# Patient Record
Sex: Female | Born: 1985 | Race: White | Hispanic: No | Smoking: Never smoker
Health system: Southern US, Community
[De-identification: ages and names within clinical notes are randomized; demographics above are authoritative.]

## PROBLEM LIST (undated history)

## (undated) DIAGNOSIS — R519 Headache, unspecified: Secondary | ICD-10-CM

## (undated) DIAGNOSIS — K859 Acute pancreatitis without necrosis or infection, unspecified: Secondary | ICD-10-CM

## (undated) DIAGNOSIS — F329 Major depressive disorder, single episode, unspecified: Secondary | ICD-10-CM

## (undated) DIAGNOSIS — R51 Headache: Secondary | ICD-10-CM

## (undated) DIAGNOSIS — G43909 Migraine, unspecified, not intractable, without status migrainosus: Secondary | ICD-10-CM

## (undated) DIAGNOSIS — F32A Depression, unspecified: Secondary | ICD-10-CM

## (undated) DIAGNOSIS — J45909 Unspecified asthma, uncomplicated: Secondary | ICD-10-CM

## (undated) DIAGNOSIS — F319 Bipolar disorder, unspecified: Secondary | ICD-10-CM

## (undated) DIAGNOSIS — I10 Essential (primary) hypertension: Secondary | ICD-10-CM

## (undated) DIAGNOSIS — F419 Anxiety disorder, unspecified: Secondary | ICD-10-CM

## (undated) DIAGNOSIS — B019 Varicella without complication: Secondary | ICD-10-CM

## (undated) DIAGNOSIS — B2799 Infectious mononucleosis, unspecified with other complication: Secondary | ICD-10-CM

## (undated) DIAGNOSIS — M549 Dorsalgia, unspecified: Secondary | ICD-10-CM

## (undated) DIAGNOSIS — R161 Splenomegaly, not elsewhere classified: Secondary | ICD-10-CM

## (undated) DIAGNOSIS — G8929 Other chronic pain: Secondary | ICD-10-CM

## (undated) DIAGNOSIS — B178 Other specified acute viral hepatitis: Secondary | ICD-10-CM

## (undated) DIAGNOSIS — I890 Lymphedema, not elsewhere classified: Secondary | ICD-10-CM

## (undated) DIAGNOSIS — I8393 Asymptomatic varicose veins of bilateral lower extremities: Secondary | ICD-10-CM

## (undated) DIAGNOSIS — F418 Other specified anxiety disorders: Secondary | ICD-10-CM

## (undated) HISTORY — DX: Headache, unspecified: R51.9

## (undated) HISTORY — DX: Varicella without complication: B01.9

## (undated) HISTORY — DX: Depression, unspecified: F32.A

## (undated) HISTORY — DX: Essential (primary) hypertension: I10

## (undated) HISTORY — DX: Major depressive disorder, single episode, unspecified: F32.9

## (undated) HISTORY — DX: Headache: R51

## (undated) HISTORY — DX: Bipolar disorder, unspecified: F31.9

## (undated) HISTORY — DX: Anxiety disorder, unspecified: F41.9

## (undated) HISTORY — DX: Migraine, unspecified, not intractable, without status migrainosus: G43.909

## (undated) HISTORY — DX: Unspecified asthma, uncomplicated: J45.909

## (undated) LAB — HM PAP SMEAR: HM Pap smear: NORMAL

---
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
## 2011-07-15 ENCOUNTER — Ambulatory Visit: Admit: 2011-07-15 | Payer: PRIVATE HEALTH INSURANCE

## 2011-07-15 DIAGNOSIS — R5383 Other fatigue: Secondary | ICD-10-CM | POA: Insufficient documentation

## 2011-07-15 NOTE — Unmapped (Signed)
Pt reporting recurring pattern - ?related to extensive tinea pedis as entry site.  Disc with pt - no current cellulitis, but needs aggressive foot care to prevent.  She is seeing Dermatology and has f/u in <=2 weeks.  Call with problems.  Verify CBC.

## 2011-07-15 NOTE — Unmapped (Signed)
Continue OCP and f/u with GYN

## 2011-07-15 NOTE — Unmapped (Signed)
Subjective  HPI:   Patient ID: Sarah [name] is a 26 y.o. female.    Chief Complaint:  HPI         26 yr old female - new patient to establish.    1)Generalized fatigue/slight intermittent dizziness/weight gain.  Ongoing issue for some time.  She denies med changes.  She has no F/C/HA/CP/edema/URI symptoms.  She does have a h/o bipolar with planned start of Lithium for better control of symptoms - med start pending check of renal function.    2)Recurrent lower leg cellulitis - she reports many episodes of cellulitis over the last several years - most recently in 12/12 when she was admitted.  She does have tinea pedis - currently on a topical anti-fungal per Dermatology.  No current redness/warmth/pain/rash.    3)PCOS - on OCP per GYN.  No recent labs results known to pt.    4)HTN - she relates this to the use of OCP's.  She is on lisinopril and reports good control.  She is compliant - doses in PM.  No HA/CP/edema.       ROS:   Review of Systems    As per HPI       Objective:   Physical Exam   Constitutional: No distress.   HENT:   Mouth/Throat: Oropharynx is clear and moist.   Eyes: Conjunctivae and EOM are normal. Pupils are equal, round, and reactive to light. No scleral icterus.   Neck: Carotid bruit is not present. No thyromegaly present.   Cardiovascular: Normal rate, regular rhythm, normal heart sounds and intact distal pulses.    No murmur heard.  Pulmonary/Chest: Effort normal and breath sounds normal. No respiratory distress. She has no wheezes. She has no rales.   Abdominal: Soft. Bowel sounds are normal. There is no tenderness.   Musculoskeletal: She exhibits no edema.   Lymphadenopathy:     She has no cervical adenopathy.   Neurological: She is alert. No cranial nerve deficit.   Skin: Rash noted.        Significant tineal changes - B feet - including inter-digital maceration and patches of dry split skin.   Psychiatric: She has a normal mood and affect. Her behavior is normal.             Filed Vitals:    07/15/11 1524   BP: 158/86   Temp: 98.6 ??F (37 ??C)   TempSrc: Oral   Height: 5' 6.5 (1.689 m)   Weight: 327 lb (148.326 kg)     Body mass index is 51.99 kg/(m^2).  Body surface area is 2.64 meters squared.                Assessment/Plan:     Patient Active Problem List   Diagnosis   ??? HTN, goal below 140/90   ??? Recurrent cellulitis of lower leg   ??? PCOS (polycystic ovarian syndrome)   ??? Fatigue

## 2011-07-15 NOTE — Unmapped (Signed)
Fatigue/etc - no clear etiology - ?metabolic (DM/thyroid) vs weight vs Bipolar.  Disc with pt - plan is to check labs - incl renal panel so pt can proceed with additional Bipolar medications.  Additional pending results.

## 2011-07-15 NOTE — Unmapped (Addendum)
Borderline today - initial visit - pt reports better control overall.  Disc salt restriction and weight changes.  Pt advised to monitor BP at home and call with updates.  Check renal.

## 2011-07-22 LAB — CBC/DIFF AMBIGUOUS DEFAULT
Basophils Absolute: 0.1 10*3/uL (ref 0.0–0.2)
Basophils Relative: 1 % (ref 0–3)
Eosinophils Absolute: 0.1 10*3/uL (ref 0.0–0.4)
Eosinophils Relative: 2 % (ref 0–7)
Hematocrit: 37.1 % (ref 34.0–46.6)
Hemoglobin: 12.3 g/dL (ref 11.1–15.9)
Immature Granulocytes Absolute: 0 10*3/uL (ref 0.0–0.1)
Immature Granulocytes: 0 % (ref 0–2)
Lymphocytes Absolute: 2.2 10*3/uL (ref 0.7–4.5)
Lymphocytes Relative: 28 % (ref 14–46)
MCH: 27.6 pg (ref 26.6–33.0)
MCHC: 33.2 g/dL (ref 31.5–35.7)
MCV: 83 fL (ref 79–97)
Monocytes Absolute: 0.6 10*3/uL (ref 0.1–1.0)
Monocytes Relative: 7 % (ref 4–13)
Neutrophils Absolute: 4.8 10*3/uL (ref 1.8–7.8)
Neutrophils Relative: 62 % (ref 40–74)
Platelets: 294 10*3/uL (ref 140–415)
RBC: 4.45 x10E6/uL (ref 3.77–5.28)
RDW: 15.7 % (ref 12.3–15.4)
WBC: 7.8 10*3/uL (ref 4.0–10.5)

## 2011-07-22 LAB — BASIC METABOLIC PANEL
BUN/Creatinine Ratio: 17 (ref 8–20)
BUN: 10 mg/dL (ref 6–20)
CO2: 23 mmol/L (ref 20–32)
Calcium: 9.2 mg/dL (ref 8.7–10.2)
Chloride: 104 mmol/L (ref 97–108)
Creatinine: 0.59 mg/dL (ref 0.57–1.00)
GFR MDRD Af Amer: 148 mL/min/{1.73_m2} (ref >59)
Glucose: 98 mg/dL (ref 65–99)
Potassium: 4.1 mmol/L (ref 3.5–5.2)
Sodium: 141 mmol/L (ref 134–144)
eGFR Non-Afr. American: 129 mL/min/{1.73_m2} (ref >59)

## 2011-07-22 LAB — HEPATIC FUNCTION PANEL
ALT: 23 IU/L (ref 0–32)
AST: 20 IU/L (ref 0–40)
Albumin: 3.9 g/dL (ref 3.5–5.5)
Alkaline Phosphatase: 81 IU/L (ref 25–150)
Bilirubin, Direct: 0.1 mg/dL (ref 0.00–0.40)
Total Bilirubin: 0.3 mg/dL (ref 0.0–1.2)
Total Protein: 7 g/dL (ref 6.0–8.5)

## 2011-07-22 LAB — LIPID PANEL
Cholesterol, Total: 200 mg/dL — ABNORMAL HIGH (ref 100–189)
HDL: 37 mg/dL — ABNORMAL LOW (ref >39)
Triglycerides: 497 mg/dL — ABNORMAL HIGH (ref 0–114)

## 2011-07-22 LAB — THYROID FUNCTION CASCADE: TSH: 1.25 u[IU]/mL (ref 0.450–4.500)

## 2011-07-22 LAB — HEMOGLOBIN A1C: Hemoglobin A1C: 5.4 % (ref 4.8–5.6)

## 2011-07-22 LAB — AMBIG ABBREV LP DEFAULT

## 2011-07-23 NOTE — Unmapped (Signed)
Message copied by Rupert Stacks on Wed Jul 23, 2011  1:39 PM  ------       Message from: Myer Peer       Created: Wed Jul 23, 2011  9:48 AM         Her labs are all normal except for the cholesterol.              Rec -        1)Proceed with Dermatology evaluation to clear the fungal infection on feet to reduce the chance for recurrent cellulitis.       2)Kidney panel is normal - proceed with Psychiatrist recs for additional medication for Bipolar disorder.       3)For cholesterol -        Increase exercise        Decrease fat/chol/calories in diet        Decrease weight        Recheck in 4 months.

## 2011-09-11 ENCOUNTER — Ambulatory Visit: Admit: 2011-09-11 | Payer: PRIVATE HEALTH INSURANCE

## 2011-09-11 DIAGNOSIS — L03115 Cellulitis of right lower limb: Secondary | ICD-10-CM

## 2011-09-11 NOTE — Unmapped (Signed)
Subjective  HPI:   Patient ID: Sarah [name] is a 26 y.o. female.    Chief Complaint:  Right leg rash    HPI       Symptoms of rash on the right leg.  Started about 2 days ago near the ankle.  Painful and swollen.  Lower leg has become red and warm to the touch.  Feeling nauseated and some vomiting.  Headaches and feeling her whole body hurts.  Had fever Tuesday night, but that she has noticed since.      When the symptoms started about 2 days ago, she started taking clindamycin every 6 hours and Bactrim every 12 hours.  She had these antibiotics on hand from a previous infection in the recent past.  Has continued these antibiotics for the past two days.  Despite that, there redness on the leg is spreading and getting significantly worse.      Has had a history of cellulitis several times in the past several years.  Last episode was in 04/2011.  Typically the left leg is affected.  Otherwise her symptoms now are identical to her usual symptoms of cellulitis.           ROS:   Review of Systems   Constitutional: Positive for fever (two days ago). Negative for activity change.   HENT: Negative for congestion and rhinorrhea.    Respiratory: Negative for cough and shortness of breath.    Cardiovascular: Negative for chest pain.   Gastrointestinal: Positive for nausea and vomiting. Negative for abdominal pain.   Genitourinary: Negative for difficulty urinating.   Musculoskeletal:        Generalized body aches.     Skin: Positive for rash (see HPI).   Neurological: Positive for headaches.          Objective:   Physical Exam   Vitals reviewed.  Constitutional: She is oriented to person, place, and time. She appears well-developed and well-nourished. No distress.   HENT:        Moist mucous membranes.     Pulmonary/Chest: Effort normal. No respiratory distress.   Musculoskeletal: She exhibits edema (see skin exam).   Neurological: She is alert and oriented to person, place, and time.   Skin:                  Filed Vitals:    09/11/11 1426   BP: 108/64   Temp: 99 ??F (37.2 ??C)   TempSrc: Oral   Height: 5' 6.5 (1.689 m)   Weight: 338 lb (153.316 kg)     Body mass index is 53.74 kg/(m^2).  Body surface area is 2.68 meters squared.            Assessment/Plan:     Patient Active Problem List   Diagnosis   ??? HTN, goal below 140/90   ??? Recurrent cellulitis of lower leg   ??? PCOS (polycystic ovarian syndrome)   ??? Fatigue       - Cellulitis of right lower leg.  History of frequent, recurrent cellulitis.  Has failed treatment with oral antibiotics (clindamycin and Bactrim), with worsening of the cellulitis while taking the antibiotics for two days.  Also with nausea and vomiting and overall feeling poorly.  Discussed treatment options and recommended going to the ED.  She will likely need IV antibiotics to clear the infection.  She agreed that seeking ED treatment would be best.  Offered to call ahead to the ED, but she wanted to check with family  before making a decision where to go.  Advised to follow-up here after ED visit/potential hospitalization.

## 2011-09-11 NOTE — Unmapped (Signed)
I recommend going to the emergency room.

## 2012-01-20 ENCOUNTER — Ambulatory Visit: Admit: 2012-01-20 | Payer: PRIVATE HEALTH INSURANCE

## 2012-01-20 DIAGNOSIS — N390 Urinary tract infection, site not specified: Secondary | ICD-10-CM

## 2012-01-20 LAB — POCT URINALYSIS DIPSTICK, AUTOMATED W/O MICRO
POCT - ALT (SGPT): 1.025 (ref 0–55)
POCT - Bilirubin, UA: NEGATIVE
POCT - Blood, UA: POSITIVE
POCT - Glucose, UA: NEGATIVE
POCT - Ketones, UA: NEGATIVE
POCT - Leukocytes Esterase, UA: POSITIVE
POCT - Nitrite, UA: NEGATIVE
POCT - Other Cells, UA: NORMAL
POCT - Protein, UA: NEGATIVE
POCT - Urobilinogen, UA: NEGATIVE
POCT - pH, UA: 6

## 2012-01-20 LAB — URINE CULTURE
Culture Result: >100000
Organism 2: <10000

## 2012-01-20 MED ORDER — phenazopyridine (PYRIDIUM) 200 MG tablet
200 | ORAL_TABLET | Freq: Three times a day (TID) | ORAL | Status: AC
Start: 2012-01-20 — End: 2012-05-10

## 2012-01-20 MED ORDER — ciprofloxacin (CIPRO) 500 MG tablet
500 | ORAL_TABLET | Freq: Two times a day (BID) | ORAL | Status: AC
Start: 2012-01-20 — End: 2012-01-28

## 2012-01-20 NOTE — Unmapped (Signed)
Subjective  HPI:   Patient ID: Sarah [name] is a 26 y.o. female.    Chief Complaint:  HPI       UTI: started 5 days ago, usually takes bactrim for uti, but had bactrim in April.  Started bactrim 4 days ago,   - No hematuria, but dysuria and frequency  - now having right sided back pain, very intense  - + nausea, no vomiting  - No fevers, chills  - No change in BMs.          ROS:   Review of Systems       Objective:   Physical Exam   Constitutional: She is oriented to person, place, and time. She appears well-developed and well-nourished. No distress.   Cardiovascular: Normal rate, regular rhythm and normal heart sounds.  Exam reveals no gallop and no friction rub.    No murmur heard.  Pulmonary/Chest: Effort normal and breath sounds normal. No respiratory distress. She has no wheezes. She has no rales.   Abdominal: Soft. Bowel sounds are normal. She exhibits no distension. There is no tenderness. There is no rebound and no guarding.        Suprapubic pain on palpation. Right sided cva tenderness   Neurological: She is alert and oriented to person, place, and time.   Skin: Skin is warm and dry. She is not diaphoretic.   Psychiatric: She has a normal mood and affect. Her behavior is normal. Judgment and thought content normal.             Filed Vitals:    01/20/12 1121   BP: 146/86   Temp: 98.6 ??F (37 ??C)   TempSrc: Oral   Height: 5' 6 (1.676 m)   Weight: 354 lb (160.573 kg)     Body mass index is 57.14 kg/(m^2).  Body surface area is 2.73 meters squared.                Assessment/Plan:     Patient Active Problem List   Diagnosis   ??? HTN, goal below 140/90   ??? Recurrent cellulitis of lower leg   ??? PCOS (polycystic ovarian syndrome)   ??? Fatigue     UTI (lower urinary tract infection)  Concern for pyelo no improvement with bactrim  - change to cipro x 7 days  - Pyridium   - go to er if start having persistent fevers, vomiting, worsening pain.

## 2012-01-20 NOTE — Unmapped (Addendum)
Concern for pyelo no improvement with bactrim  - change to cipro x 7 days  - Pyridium   - go to er if start having persistent fevers, vomiting, worsening pain.

## 2012-01-20 NOTE — Unmapped (Signed)
Please change to cipro 500mg  by mouth x 7 days, and Phenazopyridine for pain with urination.  If you start having high fever, vomiting, or extremely pain not improving with antibiotics please go to ER

## 2012-01-22 NOTE — Unmapped (Signed)
Can you please let the patient know that her urine was growing e. Coli ( a type of bacteria ) and it is sensitive to cipro, so she continue to take the full week of cipro.

## 2012-01-22 NOTE — Unmapped (Signed)
lmtcb with pt mother re: pt results

## 2012-01-28 MED ORDER — ciprofloxacin (CIPRO) 500 MG tablet
500 | ORAL_TABLET | Freq: Two times a day (BID) | ORAL | Status: AC
Start: 2012-01-28 — End: 2012-01-28

## 2012-01-28 MED ORDER — ciprofloxacin (CIPRO) 500 MG tablet
500 | ORAL_TABLET | Freq: Two times a day (BID) | ORAL | Status: AC
Start: 2012-01-28 — End: 2013-03-17

## 2012-01-28 NOTE — Unmapped (Signed)
Pt is calling back stating that she still has the uti. She has already finished all of the antibiotic on Monday. What should she do now. She is still burning w/ kidney pain. It is not as bad as it was before but it is still there. pls advise pt.

## 2012-01-28 NOTE — Unmapped (Signed)
dONE.

## 2012-01-28 NOTE — Unmapped (Signed)
Left message for the patient to call back.

## 2012-01-28 NOTE — Unmapped (Signed)
Will extend the course of Cipro and she should increase fluids.  Clarify and enter pharmacy.

## 2012-03-02 ENCOUNTER — Ambulatory Visit: Admit: 2012-03-02 | Payer: PRIVATE HEALTH INSURANCE

## 2012-03-02 DIAGNOSIS — J209 Acute bronchitis, unspecified: Secondary | ICD-10-CM

## 2012-03-02 MED ORDER — promethazine-codeine (PHENERGAN WITH CODEINE) 6.25-10 mg/5 mL syrup
6.25-10 | ORAL | 0.00 refills | 8.00000 days | Status: AC | PRN
Start: 2012-03-02 — End: 2012-05-10

## 2012-03-02 MED ORDER — azithromycin (ZITHROMAX) 250 MG tablet
250 | ORAL_TABLET | ORAL | Status: AC
Start: 2012-03-02 — End: 2012-05-10

## 2012-03-02 NOTE — Unmapped (Signed)
Subjective  HPI:   Patient ID: Sarah [name] is a 26 y.o. female.    Chief Complaint:  HPI Comments: Here with 1-2 week illness  Headache tried 800 mg ibuprofen without relief  headache for 5 days  Chills  Hasn't checked temp  Blowing yellow  Coughing non productive             Medications:  Current Outpatient Prescriptions   Medication Sig Dispense Refill   ??? citalopram (CELEXA) 20 MG tablet Take 20 mg by mouth daily.       ??? diphenhydrAMINE (BENADRYL) 25 mg tablet Take 25 mg by mouth at bedtime as needed.       ??? lisinopril (PRINIVIL,ZESTRIL) 10 MG tablet Take 10 mg by mouth daily.       ??? lithium carbonate 300 MG capsule Take 300 mg by mouth 2 times a day with meals.       ??? norethindrone-ethinyl estradiol (OVCON) 0.4-35 mg-mcg per tablet Take 1 tablet by mouth daily.       ??? penicillin G benzathine (BICILLIN L-A) 2,400,000 unit/4 mL Syrg Inject 2.4 Million Units into the muscle once.       ??? phenazopyridine (PYRIDIUM) 200 MG tablet Take 1 tablet (200 mg total) by mouth 3 times a day with meals.  6 tablet  0        ROS:   Review of Systems   Constitutional: Positive for chills, activity change and fatigue. Negative for diaphoresis, appetite change and unexpected weight change.   HENT: Negative for rhinorrhea and postnasal drip.    Respiratory: Positive for wheezing. Negative for shortness of breath.    Cardiovascular: Negative for chest pain.   Musculoskeletal: Negative for back pain and arthralgias.   Neurological: Negative for numbness.   Psychiatric/Behavioral: Negative for dysphoric mood and decreased concentration.   All other systems reviewed and are negative.           Objective:   Physical Exam   Nursing note and vitals reviewed.  Constitutional: She is oriented to person, place, and time. She appears well-developed and well-nourished. No distress.   HENT:   Head: Normocephalic and atraumatic.   Nose: Nose normal.   Mouth/Throat: No oropharyngeal exudate.   Eyes: EOM are normal. Pupils are equal, round, and  reactive to light. No scleral icterus.   Neck: Normal range of motion. Neck supple.   Cardiovascular: Normal rate and regular rhythm.    No murmur heard.  Pulmonary/Chest: Effort normal and breath sounds normal. No respiratory distress. She exhibits no tenderness.   Abdominal: Soft. Bowel sounds are normal. She exhibits no distension.   Musculoskeletal: Normal range of motion. She exhibits no edema.   Neurological: She is alert and oriented to person, place, and time.   Skin: Skin is warm and dry. No erythema.   Psychiatric: She has a normal mood and affect. Her behavior is normal. Thought content normal.             Filed Vitals:    03/02/12 1624   BP: 134/86   Temp: 98.4 ??F (36.9 ??C)   TempSrc: Oral   Height: 5' 7 (1.702 m)   Weight: 355 lb (161.027 kg)     Body mass index is 55.60 kg/(m^2).  Body surface area is 2.76 meters squared.                Assessment/Plan:     Patient Active Problem List   Diagnosis   ??? HTN, goal below 140/90   ???  Recurrent cellulitis of lower leg   ??? PCOS (polycystic ovarian syndrome)   ??? Fatigue   ??? UTI (lower urinary tract infection)

## 2012-03-02 NOTE — Unmapped (Signed)
Fluids rest zpak  Codeine cough

## 2012-03-26 NOTE — Unmapped (Signed)
ED PATIENT ENCOUNTER ARRIVAL: 03/26/12 0136             EMERGENCY DEPARTMENT -  Extremity NOTE     CHIEF COMPLAINT Chief Complaint Patient presents with   Leg Swelling   Leg Pain     HPI Sarah [name] is a 26 year old female who presents complaining of erythema, tenderness, warmth in the right lower leg and foot. Patient has a long-standing history of recurrent cellulitis of the legs. She is currently followed by infectious disease.   She states her symptoms are consistent with cellulitis and that this is how they usually began. This episode began approximately 6 hours ago. Patient states she spoke with her infectious disease doctor who advised her to come to the emergency department.   Patient is on monthly penicillin shots. She is due to have one in the next week. She is taking no other medications for her symptoms. There are no aggravating or relieving factors. She denies shortness ofbreath, chest pain, headache, dizziness. She   denies any injury.     REVIEW OF SYSTEMS See HPI for further details. Review of systems otherwise negative.     PAST MEDICAL HISTORY Past Medical History Diagnosis Date   PCOS (polycystic ovarian syndrome)   Encarnacion Chu CNP   Hypertension   Bipolar affective disorder (HCC)   Dr. Marnette Burgess   Cellulitis and abscess of leg   recurrent cellulitis   Edema   Morbid obesity   (HCC)     SURGICAL HISTORY Past Surgical History Procedure Laterality Date   Lap,cholecystectomy  2003   [facility]     CURRENT MEDICATIONS Prior to Admission medications Medication Sig Start Date End Date Taking? Authorizing Provider Penicillin G Benzathine & Proc 1200000 UNIT/2ML SUSP by Intramuscular route. Yes Med, Historical furosemide (LASIX) 20 MG TABS Take 1   tablet by mouth every morning. 11/17/11 03/26/12  Stretcher, Angelique Blonder, MD spironolactone (ALDACTONE) 50 MG TABS Take 50 mg by mouth daily.  03/26/12  Med, Historical lithium 300 MG CAPS Take 300 mg by mouth 2 (two) times daily.  03/26/12  Med, Historical   citalopram  (CELEXA) 20 MG TABS Take 20 mg by mouth daily.    03/26/12  Med, Historical Norethindrone-Eth Estradiol (OVCON-35, 28,) 0.4-35 MG-MCG TABS Take 1 tablet by mouth daily. 01/08/11 03/26/12  Stretcher, Angelique Blonder, MD lisinopril (PRINIVIL,ZESTRIL) 10 MG   TABS Take 1 tablet by mouth daily. 01/08/11 03/26/12  Stretcher, Angelique Blonder, MD     ALLERGIES No Known Allergies     FAMILY HISTORY Family History Problem Relation Age of Onset   pulm embolus[other] [OTHER] Sister   PCOS[other] [OTHER] Sister   Hypertension Mother   Diabetes Father   Hypertension Father     SOCIAL HISTORY History     Social History   Marital Status: Married   Spouse Name: N/A   Number of Children: N/A   Years of Education: N/A     Social History Main Topics   Smoking status: Never Smoker   Smokeless tobacco: Never Used   Alcohol Use: Yes    socially   Drug Use: No   Sexually Active: Yes     Other Topics Concern   None     Social History Narrative   None     PHYSICAL EXAM VITAL SIGNS: BP 149/83   Pulse 86   Temp(Src) 98.3  F (36.8  C) (Oral)   LMP 12/02/2011 Constitutional:  Well developed, well nourished, no acute distress, non-toxic appearance HENT:  Atraumatic,  Neck- normal range of motion, no   tenderness, supple Cardiovascular:  Distal pulses are intact in the  Right lower extremity, there is brisk capillary refill Musculoskeletal:   There is erythema, warmth, tenderness of the right posterior calf and dorsal foot, there is slight edema, there   is full range of motion in the ankle and toes Integument:  Well hydrated, no rash,  Erythema as above Neurologic:   Sensation to light touch and pain are intact in the right lower extremity     LABORATORY Recent Results (from the past 24 hour(s)) CBC WITH DIFFERENTIAL  Collection Time  03/26/12  2:52 AM     Result Value Range  WBC 11.8 (*) 3.6 - 10.5 THOU/mcL  RBC 4.29  3.80 - 5.20 MIL/mcL  HEMOGLOBIN 12.3  12.0 - 15.2 g/dL  HEMATOCRIT 35 (*)   36 - 46 %  MCV 82  82 - 97 fL  MCH 29  27 - 33 pg  MCHC 35  32 - 36 g/dL   RDW 16.1 (*) <09.6 %  PLATELET 320  140 - 375 THOU/mcL  ABS. NEUTROPHIL 7.83 (*) 1.80 - 7.70 THOU/mcL  ABS LYMPHS 2.98  1.00 - 4.00 THOU/mcL  ABS MONOS 0.78  0.20 - 0.90 THOU/mcL    ABS EOS 0.16  0.03 - 0.45 THOU/mcL  ABS BASOS 0.07  0.01 - 0.07 THOU/mcL  SEGS 66  LYMPHOCYTES 25  MONOCYTES 7  EOSINOPHIL 1  BASOPHILS 1 D DIMER QUANTITATIVE  Collection Time  03/26/12  2:52 AM     Result Value Range  D DIMER QUANTITATIVE 0.39  <0.5 mg/L   FEU POCT METABOLIC PANEL - ISTAT  Collection Time  03/26/12  2:57 AM     Result Value Range  GLUCOSE 122 (*) 74 - 99 mg/dL  BLD UREA NITROGEN 10  8 - 26 mg/dL  CREATININE 0.6  0.5 - 1.2 mg/dL  SODIUM 045  409 - 811 mEq/L  POTASSIUM 3.7  3.6 - 5.1 mEq/L    CHLORIDE 105  101 - 111 mEq/L  CO2 TOTAL 23 (*) 24 - 36 mEq/L  IONIZED CALCIUM 4.9  4.6 - 5.4 mg/dL  ANION GAP 19 (*) 6 - 18  HEMOGLOBIN,CALC 11.9 (*) 12.0 - 15.0 g/dL  HEMATOCRIT 35 (*) 36 - 47 %PCV POCT URINALYSIS  Collection Time  03/26/12  3:14 AM       Result Value Range  COLOR,POC URINE YELLOW  YELLOW  CLARITY,POC URINE CLEAR  CLEAR  PH,POC URINE 6.0  5.0 - 8.0  SPEC GRAVITY POC UR 1.015  1.005 - 1.029  PROTEIN,POC URINE NEGATIVE  NEGATIVE  GLUCOSE,POC URINE NEGATIVE  NEGATIVE  KETONES,POC URINE   NEGATIVE  NEGATIVE  BILIRUBIN,POC URINE NEGATIVE  NEGATIVE  BLOOD,POC URINE TRACE (*) NEGATIVE  UROBILINOGEN,POC URINE 0.2  <2.0  NITRITE,POC URINE NEGATIVE  NEGATIVE  LEUK ESTERASE,POC URN NEGATIVE  NEGATIVE POCT GONADOT HCG QUALITATIVE  Collection Time    03/26/12  3:22 AM     Result Value Range  HCG,POC URINE NEGATIVE  NEGATIVE     ED COURSE & MEDICAL DECISION MAKING     Active Problems:   * No active hospital problems. *     Pertinent Labs & Imaging studies reviewed. (See chart for details) Patient presented to the emergency department complaining of erythema, tenderness, edema in the right lower leg. She has a long-standing history of recurrent cellulitis in the legs.   Examination is consistent with cellulitis. Laboratory evaluation  is reassuring.  White blood cell count is 11.8. There is no significant metabolic abnormalities, d-dimer is 0.39. Urinalysis is essentially negative. Review of the patient's chart does   reveal that most recently the most effective treatment regimen involved IV clindamycin and monthly shots of IM penicillin.  We have Elected to treat with IV Unasyn and IV clindamycin. Patient did complain of some nausea so I have also ordered IV Zofran.     Anticipated the patient will be discharged home provided her vitals remained stable.   She will be given prescriptions for oral clindamycin and Augmentin. She'll be advised to followup with infectious disease. She  Or return should she develop   increased erythema and edema of the extremities, fever not relieved by over-the-counter medications, vomiting with inability to tolerate her medications, or other concerning symptoms.     The patient was given the following medication in the Emergency department:     Medication Administration from 03/26/2012 0136 to 03/26/2012 0353    Date/Time Order Dose Route Action Action by Comments   03/26/2012 0347 ampicillin-sulbactam sodium (UNASYN) 3 g in sodium chloride 0.9 % 100 mL IVPB 3 g Intravenous New Bag Daune Perch   03/26/2012 0347 ondansetron (ZOFRAN) injection 4 mg 4 mg Intravenous Given Daune Perch         The patient is discharged home in stable condition with appropriate instructions.     FINAL DIAGNOSIS:     1. Cellulitis and abscess of leg         The patient was given the following medications to go home with.     New Prescriptions  AMOXICILLIN-CLAVULANATE (AUGMENTIN) 875-125 MG TABS    Take 1 tablet by mouth 2 (two) times daily for 14 days.  CLINDAMYCIN (CLEOCIN) 300 MG CAPS    Take 1 capsule by mouth 4 (four) times daily.     Arlyss Repress., DO provided face-to-face care for this patient and is in agreement with the above diagnosis and treatment plan.         Guadelupe Sabin., PA 03/26/12 0347         Guadelupe Sabin.,  PA 03/26/12 0354         _________________________________  Signed by:    Guadelupe Sabin    10    D: 03/26/2012 03:54 AM  T: 03/26/2012 03:54 AM    This document is confidential medical information.  Unauthorized disclosure or use of this information is prohibited by law.  If you are not the intended recipient of this document, please advise [place] by calling immediately [phone].

## 2012-03-26 NOTE — Unmapped (Signed)
CURRENT STATUS IS EMERGENCY PENDING CARE MANAGEMENT DESIGNATION.     Any questions regarding MEDICAL RECORDS should be directed to Medical Records: [facility] [phone] or [facility] [phone]. Any questions regarding BILLING should be directed to the Care Management Departments: [facility] [phone] or [facility] [phone].         _________________________________  Signed byJamison Neighbor  NOTIFY    ZZ    D: 03/26/2012 01:36 AM  T:    This document is confidential medical information.  Unauthorized disclosure or use of this information is prohibited by law.  If you are not the intended recipient of this document, please advise [place] by calling immediately [phone].

## 2012-03-26 NOTE — Unmapped (Signed)
I have personally seen and examined this patient, with the following particular findings: Moderate erythema of the right lower/medial calf. This extends from the level of the ankle almost to the level of the knee. No true lymphangitis noted. Homans   appears negative. No medial thigh tenderness.     I have fully participated in the care of this patient. I have reviewed and agree with all pertinent clinical information including history, physical exam, labs, radiographic studies and the plan. I have also reviewed and agree with the medications,   allergies and past medical history sections for this patient.         Electronically signed by:     Marc A. Daneil Dolin DO         Arlyss Repress., DO 03/26/12 1610         _________________________________  Signed by:    Liz Beach. TUEL    10    D: 03/26/2012 06:46 AM  T: 03/26/2012 06:46 AM    This document is confidential medical information.  Unauthorized disclosure or use of this information is prohibited by law.  If you are not the intended recipient of this document, please advise [place] by calling immediately [phone].

## 2012-04-12 ENCOUNTER — Ambulatory Visit: Payer: PRIVATE HEALTH INSURANCE

## 2012-04-13 ENCOUNTER — Ambulatory Visit: Admit: 2012-04-13 | Payer: PRIVATE HEALTH INSURANCE

## 2012-04-13 ENCOUNTER — Inpatient Hospital Stay: Admit: 2012-04-13 | Payer: PRIVATE HEALTH INSURANCE

## 2012-04-13 DIAGNOSIS — N390 Urinary tract infection, site not specified: Secondary | ICD-10-CM

## 2012-04-13 LAB — POCT URINALYSIS DIPSTICK, NONAUTOMATED; W/O MICRO
POCT - ALT (SGPT): 1.05 (ref 0–55)
POCT - Bilirubin, UA: NEGATIVE
POCT - Blood, UA: NEGATIVE
POCT - Ketones, UA: NEGATIVE
POCT - Leukocytes Esterase, UA: NEGATIVE
POCT - Nitrite, UA: NEGATIVE
POCT - Protein, UA: NEGATIVE
POCT - Urobilinogen, UA: NEGATIVE
POCT - pH, UA: 5

## 2012-04-13 LAB — URINE CULTURE

## 2012-04-13 MED ORDER — sulfamethoxazole-trimethoprim (BACTRIM DS) 800-160 mg per tablet
800-160 | ORAL_TABLET | Freq: Two times a day (BID) | ORAL | Status: AC
Start: 2012-04-13 — End: 2012-04-16

## 2012-04-13 NOTE — Unmapped (Signed)
UTI symptoms - plan is Bactrim + ucx/fluids.  Additional pending results.  Call with problems.

## 2012-05-04 ENCOUNTER — Ambulatory Visit: Admit: 2012-05-04 | Payer: PRIVATE HEALTH INSURANCE

## 2012-05-04 DIAGNOSIS — M549 Dorsalgia, unspecified: Secondary | ICD-10-CM

## 2012-05-04 MED ORDER — ibuprofen (ADVIL,MOTRIN) 800 MG tablet
800 | ORAL_TABLET | Freq: Three times a day (TID) | ORAL | Status: AC | PRN
Start: 2012-05-04 — End: 2021-09-09
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
## 2012-05-04 NOTE — Unmapped (Signed)
Please get x rays done and take ibuprofen 800 mg 3x per day for 2 wks. You can use ice for the next couple days then try heat and stretching.

## 2012-05-04 NOTE — Unmapped (Signed)
Likely MK origin, but does have some bony tenderness, checking x ray of thoracic and lumbar.  - ibuprofen 800 mg 3x per day x 14 days  - ice first then can add heat and stretching, will refer to PT in needed.

## 2012-05-06 NOTE — Unmapped (Signed)
Patient has X-ray Tuesday afternoon and wants to know the results. Also says her back popped and its hurting and when she breathes, she feels a sharp pain in back. Please call with results [phone]

## 2012-05-06 NOTE — Unmapped (Signed)
Spoke with patient, she was getting out of chair and felt pop in her rib cage area on her back, now having pain with deep inspiration.  No sob, cough.    - likely costochondritis, vs muscle strain; advised cont nsaids for another 5-7 days and if no improvement to call back. Offered muscle relaxer if she feels this is a spasm and pt declined.

## 2012-05-10 ENCOUNTER — Ambulatory Visit: Admit: 2012-05-10 | Payer: PRIVATE HEALTH INSURANCE

## 2012-05-10 DIAGNOSIS — R600 Localized edema: Secondary | ICD-10-CM

## 2012-05-10 NOTE — Unmapped (Signed)
Patient informed. Appointment scheduled.

## 2012-05-10 NOTE — Unmapped (Signed)
Rec appt to see if doppler is needed - Dr Allena Katz has appts.

## 2012-05-10 NOTE — Unmapped (Signed)
Patient noticed a hard red spot on back of leg, not sure if she needs to come in or can she just have an order to get a doppler. Says she had them done in the past but not sure if you sent her to get a doppler.  Home number is priority number

## 2012-05-10 NOTE — Unmapped (Signed)
Subjective  HPI:   Patient ID: Sarah [name] is a 26 y.o. female.    Chief Complaint:  HPI         Patient has a history of recurrent cellulitis in bilateral legs, most recent episode during Halloween. She noticed a hard, purple spot on right leg since then which has not resolved. She also has chronic lymphedema. Yesterday she developed a burning pain in right calf. The area is hard and red. No prior history of DVT. Prior LE Dopplers negative. She was on OCP in the past. No fevers, chills, cough, SOB, CP. Sister has a history of multiple DVT and PE a year ago and is on Warfarin.     ROS:   Review of Systems - per HPI       Objective:   Physical Exam   Vitals reviewed.  Constitutional: No distress.   Morbidly obese   Cardiovascular: Normal rate, regular rhythm and normal heart sounds.    Pulmonary/Chest: Effort normal and breath sounds normal. No respiratory distress.   Musculoskeletal: She exhibits edema (bilateral lower extremity edema).   There is a tender nodule on right calf with edema with mild discoloration. No erythema or warmth.             Filed Vitals:    05/10/12 1305   BP: 124/82   Temp: 98.6 ??F (37 ??C)   TempSrc: Oral   Height: 5' 6 (1.676 m)   Weight: 356 lb (161.481 kg)     Body mass index is 57.49 kg/(m^2).  Body surface area is 2.74 meters squared.        Assessment/Plan:     RLE swelling  - There is knot on right calf that is tender. She has bilateral LE lymphedema. FH of multiple DVT and PE in sister.   - Check BLE Dopplers to rule out DVT. If positive for DVT, will need inpatient admission for IV Heparin drip (cannot use Lovenox due to morbid obesity) and Warfarin.

## 2012-05-11 NOTE — Unmapped (Signed)
1293                               Lower Extremity Venous Duplex Report      EPI: [id number]                Gender: Female  Accession Number: [id number]  Order Number: [id number]  Study Date: 05/10/2012 02:11 PM      Procedure: 93970 Bilateral lower extremity venous study.      Indication: 782.3 Edema. 782.2 Localized superficial swelling, mass or lump.      Quality: Adequate.      Study Description: The veins of the bilateral lower extremity were evaluated  including the common femoral, femoral, popliteal, tibial, and saphenous veins.      Right Lower Extremity Venous Findings: No thrombus detected in the deep veins  of the right lower extremity. There are enlarged lymph nodes identified in the  groin.      Left Lower Extremity Venous Findings: No thrombus detected in the deep veins  of the left lower extremity. There are enlarged lymph nodes identified in the  groin.      Impression:       1.No evidence of acute deep or superficial venous thrombosis right or left      lower extremities . There are enlarged lymph nodes in the inguinal area.      Signed AO:ZHYQ Tanja Port, MD on 05/11/2012 06:38 AM  Referring Physician: Wende Crease  Performed By: Jimmy Picket, RN RVT  SIGNED BY: Matilde Bash, MD on 05/11/2012  7:00 AM    Dictated by: Matilde Bash    This document is confidential medical information.  Unauthorized disclosure or use of this information is prohibited by law.  If you are not the intended recipient of this document, please advise [place] by calling immediately [phone].

## 2012-05-24 NOTE — Unmapped (Signed)
CURRENT STATUS IS EMERGENCY PENDING CARE MANAGEMENT DESIGNATION.     Any questions regarding MEDICAL RECORDS should be directed to Medical Records: [facility] [phone] or [facility] [phone]. Any questions regarding BILLING should be directed to the Care Management Departments: [facility] [phone] or [facility] [phone].         _________________________________  Signed byJamison Neighbor  NOTIFY    ZZ    D: 05/24/2012 08:29 PM  T:    This document is confidential medical information.  Unauthorized disclosure or use of this information is prohibited by law.  If you are not the intended recipient of this document, please advise [place] by calling immediately [phone].

## 2012-05-24 NOTE — Unmapped (Signed)
ED PATIENT ENCOUNTER ARRIVAL: 05/24/12 2029     History     Chief Complaint Patient presents with   Leg Pain     HPI Patient states he started having pain in her right leg and swelling. Patient has multiple issues with cellulitis of her lower extremities. Patient received Bicillin injections monthly for relief of her symptoms and is usually helpful. Patient states   yesterday she started having pain today increased redness and came to the emergency room. Patient is a dull ache in her right calf is not made better or worse by anything. Patient states her pain is 6/10.         Past Medical History Diagnosis Date   PCOS (polycystic ovarian syndrome)   Sarah Chu CNP   Hypertension   Bipolar affective disorder (HCC)   Dr. Marnette [name]   Cellulitis and abscess of leg   recurrent cellulitis   Edema   Morbid obesity [facility])     Past Surgical History Procedure Laterality Date   Lap,cholecystectomy  2003   [facility]     Prior to Admission medications Medication Sig Start Date End Date Taking? Authorizing Provider clindamycin (CLEOCIN) 300 MG CAPS Take 1 capsule by mouth 4 (four) times daily. 03/26/12 05/24/12  Soward, Rhett Bannister., PA Penicillin G Benzathine & Proc   1200000 UNIT/2ML SUSP by Intramuscular route. Med, Historical     No Known Allergies     Family History Problem Relation Age of Onset   pulm embolus[other] [OTHER] Sister   PCOS[other] [OTHER] Sister   Hypertension Mother   Diabetes Father   Hypertension Father     History Substance Use Topics   Smoking status: Never Smoker   Smokeless tobacco: Never Used   Alcohol Use: Yes    Comment: socially     Review of Systems Constitutional: Negative for fever and chills. HENT: Negative for congestion. Eyes: Negative for visual disturbance. Respiratory: Negative for shortness of breath. Cardiovascular: Negative for chest pain. Gastrointestinal: Negative for   nausea, vomiting, abdominal pain and diarrhea. Genitourinary: Negative for dysuria. Musculoskeletal: Negative for back pain.  Skin: Positive for rash. Neurological: Negative for dizziness and headaches. Psychiatric/Behavioral: Negative for confusion. All   other systems reviewed and are negative.         Physical Exam BP 148/92   Pulse 105   Temp(Src) 97.8  F (36.6  C) (Oral)   Resp 18     Physical Exam Constitutional: She appears well-developed and well-nourished. HENT: Head: Normocephalic. Eyes: Right eye exhibits no discharge. Left eye exhibits no discharge. Neck: Normal range of motion. Cardiovascular: Normal rate and regular rhythm.    Exam reveals no gallop and no friction rub. No murmur heard. Pulmonary/Chest: Effort normal and breath sounds normal. No respiratory distress. She has no wheezes. She has no rales. Abdominal: Soft. There is no tenderness. Musculoskeletal: Normal range   of motion. She exhibits edema. Neurological: She is alert. Skin: Slight erythema noted right gastrocnemius. No crepitus necrotic tissue or abscess. Psychiatric: She has a normal mood and affect.     Recent Results (from the past 12 hour(s)) CBC WITH DIFFERENTIAL  Collection Time  05/24/12  9:03 PM     Result Value Range  WBC 12.5 (*) 3.6 - 10.5 THOU/mcL  RBC 4.80  3.80 - 5.20 MIL/mcL  HEMOGLOBIN 13.7  12.0 - 15.2 g/dL  HEMATOCRIT 39  36 - 46 %  MCV   82  82 - 97 fL  MCH 29  27 - 33 pg  MCHC 35  32 - 36 g/dL  RDW 16.1 (*) <09.6 %  PLATELET 364  140 - 375 THOU/mcL  ABS. NEUTROPHIL 8.72 (*) 1.80 - 7.70 THOU/mcL  ABS LYMPHS 2.95  1.00 - 4.00 THOU/mcL  ABS MONOS 0.52  0.20 - 0.90 THOU/mcL  ABS EOS 0.18    0.03 - 0.45 THOU/mcL  ABS BASOS 0.08 (*) 0.01 - 0.07 THOU/mcL  SEGS 69  LYMPHOCYTES 24  MONOCYTES 4  EOSINOPHIL 2  BASOPHILS 1 POCT METABOLIC PANEL - ISTAT  Collection Time  05/24/12  9:11 PM     Result Value Range  GLUCOSE 129 (*) 74 - 99 mg/dL  BLD   UREA NITROGEN 9  8 - 26 mg/dL  CREATININE 0.8  0.5 - 1.2 mg/dL  SODIUM 045  409 - 811 mEq/L  POTASSIUM 3.6  3.6 - 5.1 mEq/L  CHLORIDE 103  101 - 111 mEq/L  CO2 TOTAL 26  24 - 36 mEq/L  IONIZED CALCIUM 4.8  4.6  - 5.4 mg/dL  ANION GAP 14  6 - 18    HEMOGLOBIN,CALC 13.9  12.0 - 15.0 g/dL  HEMATOCRIT 41  36 - 47 %PCV     ED Course Procedures     MDM Patient was treated with Rocephin in the emergency room as well as discharged home with penicillin after discussion with her infectious disease physician. Patient will followup with her in a few days for evaluation. The patient was given the   following medication in the Emergency department:     Medication Administration from 05/24/2012 2029 to 05/24/2012 2239    Date/Time Order Dose Route Action Action by Comments   05/24/2012 2144 cefTRIAXone (ROCEPHIN) 1 g in sodium chloride 0.9 % 50 mL IVPB Intravenous New Bag Sarah [name]   05/24/2012   2219 cefTRIAXone (ROCEPHIN) 1 g in sodium chloride 0.9 % 50 mL IVPB 0 g Intravenous Completed Sarah [name]         Final Diagnosis:     1. Cellulitis, leg         The patient was given the following medications to go home with.     New Prescriptions  PENICILLIN V POTASSIUM (VEETID) 500 MG TABS    Take 1 tablet by mouth 4 (four) times daily for 10 days.         Follow-up Information  Follow up With Details Comments Contact Info  Melvenia Needles, MD Call in 1 day  [address] [place] [address] [phone]      Margarito Courser., MD Call in 1 day  [address] [place] [address] [phone]         Valinda Party, MD spent face to face time with the patient and agrees with above diagnosis and treatment plan.     Electronically signed by: Sarah Green, PA             Chilton Si Sarah, [place] 05/24/12 2239         _________________________________  Signed by:    Sarah  [name]    GR    D: 05/24/2012 10:39 PM  T: 05/24/2012 10:39 PM    This document is confidential medical information.  Unauthorized disclosure or use of this information is prohibited by law.  If you are not the intended recipient of this document, please advise [place] by calling immediately [phone].

## 2012-05-25 NOTE — Unmapped (Signed)
Emergency DEPARTMENT -  Abdomen NOTE     CHIEF COMPLAINT Chief Complaint Patient presents with   Abdominal Pain   Nausea     HPI Sarah [name] is a 26 year old female with a past medical history of recurrent cellulitis in the leg, currently on Pen VK who presents with several hours of epigastric and RUQ abdominal cramping and nausea.  She describes the pain as crampy and   radiating into her left back.  There is associated nausea and dry heaves but no vomiting. She has taken no medications for her symptoms. She denies hematochezia, diarrhea, dysuria, urgency, frequency, fever.     REVIEW OF SYSTEMS See HPI for further details. Review of systems otherwise negative.     PAST MEDICAL HISTORY Past Medical History Diagnosis Date   PCOS (polycystic ovarian syndrome)   Encarnacion Chu CNP   Hypertension   Bipolar affective disorder (HCC)   Dr. Marnette Burgess   Cellulitis and abscess of leg   recurrent cellulitis   Edema   Morbid obesity   (HCC)     SURGICAL HISTORY Past Surgical History Procedure Laterality Date   Lap,cholecystectomy  2003   [facility] [place]     CURRENT MEDICATIONS Prior to Admission medications Medication Sig Start Date End Date Taking? Authorizing Provider penicillin V potassium (VEETID) 500 MG TABS Take 1 tablet by mouth 4 (four) times daily for 10 days. 05/24/12 06/03/12 Yes Valinda Party,   MD Penicillin G Benzathine & Proc 1200000 UNIT/2ML SUSP by Intramuscular route. Yes Med, Historical     ALLERGIES No Known Allergies     FAMILY HISTORY Family History Problem Relation Age of Onset   pulm embolus[other] [OTHER] Sister   PCOS[other] [OTHER] Sister   Hypertension Mother   Diabetes Father   Hypertension Father     SOCIAL HISTORY History     Social History   Marital Status: Married   Spouse Name: N/A   Number of Children: N/A   Years of Education: N/A     Social History Main Topics   Smoking status: Never Smoker   Smokeless tobacco: Never Used   Alcohol Use: Yes    Comment: socially   Drug Use: No   Sexually Active: Yes     Other Topics Concern   None     Social History Narrative   None     PHYSICAL EXAM VITAL SIGNS: BP 161/69   Pulse 91   Temp(Src) 98.2  F (36.8  C) (Oral)   Resp 17   Ht 67 (170.2 cm)   Wt 350 lb (158.759 kg)   BMI 54.8 kg/m2   SpO2 99%   LMP 12/24/2011   Breastfeeding? No Constitutional:  Well developed, well nourished,   moderate distress secondary to abdominal pain, non-toxic appearance Eyes:  PERRL, conjunctiva normal, anicteric HENT:  Atraumatic, oropharynx moist. Neck supple without adenopathy Respiratory:  No respiratory distress, lungs are clear to auscultation   bilaterally Cardiovascular: Regular rate and rhythm, no murmurs, no gallops, no rubs GI: Abdomen is soft, obese, nondistended, tender in the epigastric and left upper quadrant region, no rebound tenderness, no referred pain, no hepatosplenomegaly   palpated although exam is limited by body habitus Neurologic:  Alert & oriented x 3, no focal deficits     LABORATORY Recent Results (from the past 24 hour(s)) CBC WITH DIFFERENTIAL  Collection Time  05/24/12  9:03 PM     Result Value Range  WBC 12.5 (*) 3.6 - 10.5 THOU/mcL  RBC 4.80  3.80 - 5.20 MIL/mcL  HEMOGLOBIN 13.7  12.0 - 15.2 g/dL  HEMATOCRIT 39  36   - 46 %  MCV 82  82 - 97 fL  MCH 29  27 - 33 pg  MCHC 35  32 - 36 g/dL  RDW 16.1 (*) <09.6 %  PLATELET 364  140 - 375 THOU/mcL  ABS. NEUTROPHIL 8.72 (*) 1.80 - 7.70 THOU/mcL  ABS LYMPHS 2.95  1.00 - 4.00 THOU/mcL  ABS MONOS 0.52  0.20 - 0.90 THOU/mcL  ABS   EOS 0.18  0.03 - 0.45 THOU/mcL  ABS BASOS 0.08 (*) 0.01 - 0.07 THOU/mcL  SEGS 69  LYMPHOCYTES 24  MONOCYTES 4  EOSINOPHIL 2  BASOPHILS 1 POCT METABOLIC PANEL - ISTAT  Collection Time  05/24/12  9:11 PM     Result Value Range  GLUCOSE 129 (*) 74 - 99   mg/dL  BLD UREA NITROGEN 9  8 - 26 mg/dL  CREATININE 0.8  0.5 - 1.2 mg/dL  SODIUM 045  409 - 811 mEq/L  POTASSIUM 3.6  3.6 - 5.1 mEq/L  CHLORIDE 103  101 - 111 mEq/L  CO2 TOTAL 26  24 - 36 mEq/L  IONIZED CALCIUM 4.8  4.6 - 5.4 mg/dL  ANION GAP 14  6 -  18    HEMOGLOBIN,CALC 13.9  12.0 - 15.0 g/dL  HEMATOCRIT 41  36 - 47 %PCV CBC WITH DIFFERENTIAL  Collection Time  05/25/12  3:55 PM     Result Value Range  WBC 9.9  3.6 - 10.5 THOU/mcL  RBC 4.58  3.80 - 5.20 MIL/mcL  HEMOGLOBIN 13.0  12.0 - 15.2 g/dL    HEMATOCRIT 38  36 - 46 %  MCV 82  82 - 97 fL  MCH 29  27 - 33 pg  MCHC 35  32 - 36 g/dL  RDW 91.4 (*) <78.2 %  PLATELET 350  140 - 375 THOU/mcL  ABS. NEUTROPHIL 6.84  1.80 - 7.70 THOU/mcL  ABS LYMPHS 2.34  1.00 - 4.00 THOU/mcL  ABS MONOS 0.45  0.20 -   0.90 THOU/mcL  ABS EOS 0.18  0.03 - 0.45 THOU/mcL  ABS BASOS 0.05  0.01 - 0.07 THOU/mcL  SEGS 68  LYMPHOCYTES 24  MONOCYTES 5  EOSINOPHIL 2  BASOPHILS 1 HEPATIC FUNCTION PANEL (TBIL,DBIL,ALK,AST,ALT,TP,ALB)  Collection Time  05/25/12  3:55 PM     Result   Value Range  ALK PHOSPHATASE 92  35 - 135 IU/L  TOTAL PROTEIN 7.2  6.0 - 8.0 g/dL  ALBUMIN 3.7  3.5 - 5.0 g/dL  AST 40  10 - 40 IU/L  ALT 52  10 - 60 IU/L  TOTAL BILIRUBIN 1.1  0.0 - 1.2 mg/dL  DIRECT BILIRUBIN 0.1  0.0 - 0.2 mg/dL AMYLASE  Collection   Time  05/25/12  3:55 PM     Result Value Range  AMYLASE 106 (*) 28 - 100 U/L LIPASE  Collection Time  05/25/12  3:55 PM     Result Value Range  LIPASE 100 (*) 22 - 51 U/L PROTIME & INR  Collection Time  05/25/12  3:55 PM     Result Value Range  PROTIME   11.0  9.0 - 11.4 Seconds  INR 1.1  0.8 - 1.2 POCT METABOLIC PANEL - ISTAT  Collection Time  05/25/12  4:01 PM     Result Value Range  GLUCOSE 139 (*) 74 - 99 mg/dL  BLD UREA NITROGEN 9  8 - 26 mg/dL  CREATININE 0.7  0.5 -  1.2 mg/dL  SODIUM 540  981 - 191   mEq/L  POTASSIUM 3.8  3.6 - 5.1 mEq/L  CHLORIDE 104  101 - 111 mEq/L  CO2 TOTAL 25  24 - 36 mEq/L  IONIZED CALCIUM 4.7  4.6 - 5.4 mg/dL  ANION GAP 17  6 - 18  HEMOGLOBIN,CALC 12.6  12.0 - 15.0 g/dL  HEMATOCRIT 37  36 - 47 %PCV POCT GONADOT HCG   QUALITATIVE  Collection Time  05/25/12  4:56 PM     Result Value Range  HCG,POC URINE NEGATIVE  NEGATIVE     RADIOLOGY XR ABDOMEN FLAT AND UPRIGHT  Final Result:      Limited  due to morbid obesity.  No bowel obstruction.             ED COURSE & MEDICAL DECISION MAKING Pertinent Labs & Imaging studies reviewed. (See chart for details) Patient presented to the emergency department with a one-day history of epigastric and left upper quadrant abdominal pain with nausea and dry heaves.   She has a history of recurrent cellulitis of the legs and is currently taking pen VK which was started during her ER visit last night. She has not been tolerating fluids at home. Laboratory evaluation reveals a slightly elevated lipase at 100, no   leukocytosis, normal hepatic function, normal urinalysis, negative urine pregnancy test. There are no significant metabolic abnormalities. X-rays negative for extraction. I discussed the patient with the on-call provider from Dr. Eugene Garnet office who   agrees the patient should be admitted for IV hydration and pain control. I then discussed the case with Dr. Vance Peper of [facility] medicine and he has agreed to accept the patient for admission. I discussed my findings and the plan with the patient and she   expressed understanding. She is in agreement with the plan for admission.     The patient was given the following medication in the Emergency department:     Medication Administration from 05/25/2012 1509 to 05/25/2012 1924    Date/Time Order Dose Route Action Action by Comments   05/25/2012 1711 pantoprazole (PROTONIX) intravenous injection 40 mg 40 mg Intravenous Given Zunay  Pizarro   05/25/2012 1712   ondansetron (ZOFRAN) injection 4 mg 4 mg Intravenous Given Zunay  Pizarro   05/25/2012 1808 morphine injection 4 mg 4 mg Intravenous Given Zunay  Pizarro         Patient is admitted in stable condition.     FINAL DIAGNOSIS:     1. Abdominal pain 2. Nausea     Marybelle Killings D., DO provided face-to-face care for this patient and is in agreement with the above diagnosis and treatment plan.     Electronically signed by: Rhett Bannister. Soward, PA             Guadelupe Sabin., [place] 05/25/12 1924         _________________________________  Signed by:    Guadelupe Sabin    10    D: 05/25/2012 07:24 PM  T: 05/25/2012 07:24 PM    This document is confidential medical information.  Unauthorized disclosure or use of this information is prohibited by law.  If you are not the intended recipient of this document, please advise [place] by calling immediately [phone].

## 2012-05-25 NOTE — Unmapped (Signed)
EMERGENCY  ATTENDING NOTE     I have personally seen and examined this patient. I have fully participated in the care of this patient. I have reviewed and agree with all pertinent clinical information including history, physical exam, labs, radiographic studies and the plan. I have   also reviewed and agree with the medications, allergies and past medical history sections for this patient.     She presented today with 4 hours duration of epigastric pain and left upper quadrant pain. She states that she sometimes has a somewhat and when she drinks water but tends to go away after 20 minutes. She states today it has not gone away and she is   eating food which didn't seem to help. She receives started she states on penicillin for cellulitis of the leg. Denies any vomiting diarrhea she said some nausea denies any fever .  This has a mild frontal headache denies any sore throat no chest pain   cough or shortness of breath denies any urinary symptoms denies being pregnant. She states she was treated for sinusitis of the right leg.She states it seems to be improving. Electronically signed by Gerhard Munch, DO         Marybelle Killings D., DO 05/25/12 2100         _________________________________  Signed by:    Gerhard Munch    10    D: 05/25/2012 09:00 PM  T: 05/25/2012 09:00 PM    This document is confidential medical information.  Unauthorized disclosure or use of this information is prohibited by law.  If you are not the intended recipient of this document, please advise [place] by calling immediately [phone].

## 2012-05-25 NOTE — Unmapped (Signed)
CURRENT STATUS IS INPATIENT PENDING CARE MANAGEMENT DESIGNATION.     Any questions regarding MEDICAL RECORDS should be directed to Medical Records: GOOD [facility] [phone] or [facility] [phone]. Any questions regarding BILLING should be directed to the Care Management Departments: [facility] [phone] or [facility] [phone].         _________________________________  Signed byJamison Neighbor  NOTIFY    ZZ    D: 05/25/2012 03:09 PM  T:    This document is confidential medical information.  Unauthorized disclosure or use of this information is prohibited by law.  If you are not the intended recipient of this document, please advise [place] by calling immediately [phone].

## 2012-05-25 NOTE — Unmapped (Signed)
ADMISSION HISTORY AND PHYSICAL     Patient Name:  Sarah [name] DOB:  [birth year] MEDICAL RECORD NUMBER [id number]     Primary Care Physician Melvenia Needles     CHIEF COMPLAINT Epigastric pain and nausea     History This is a 26 year old female with a past medical history of polycystic ovary disease obesity bipolar affective disorder recurrent cellulitis presented with history of epigastric pain and nausea. Patient was seen the ER last night due to   recurrent cellulitis she went home She woke up this morning was feeling fine around 11:00 she felt pain in epigastric area radiating to her back along with nausea no emesis no fever no chills. Symptoms continued and she came here for further evaluations.    In the Er she was seen and evaluated labs showed elevated lipase, received morphine without any relief and she was given IV Dilaudid along with Phenergan which helped the symptoms  she will be admitted for further evaluation and treatment She recently   started Victoza instead of  metformin due to side effects, stopped taking her medications for bipolar disorder especially weight gain     Past Medical History Past Medical History Diagnosis Date   PCOS (polycystic ovarian syndrome)   Encarnacion Chu CNP   Hypertension   Bipolar affective disorder (HCC)   Dr. Marnette Burgess   Cellulitis and abscess of leg   recurrent cellulitis   Edema   Morbid obesity   (HCC)     Past Surgical History Past Surgical History Procedure Laterality Date   Lap,cholecystectomy  2003   [facility]     Social History History     Social History   Marital Status: Married   Spouse Name: N/A   Number of Children: N/A   Years of Education: N/A     Occupational History   Not on file.     Social History Main Topics   Smoking status: Never Smoker   Smokeless tobacco: Never Used   Alcohol Use: Yes    Comment: socially   Drug Use: No   Sexually Active: Yes     Other Topics Concern   Not on file     Social History Narrative   No narrative on file     Family  History Family History Problem Relation Age of Onset   pulm embolus[other] [OTHER] Sister   PCOS[other] [OTHER] Sister   Hypertension Mother   Diabetes Father   Hypertension Father     Current Medications Current Outpatient Rx Name  Route  Sig  Dispense  Refill   penicillin V potassium (VEETID) 500 MG TABS   Oral   Take 1 tablet by mouth 4 (four) times daily for 10 days.   40 tablet   0       Penicillin G Benzathine & Proc 1200000 UNIT/2ML SUSP   Intramuscular   by Intramuscular route.         Allergies No Known Allergies     REVIEW OF SYSTEMS Constitutional:  Denies fever or chills Eyes:  Denies change in visual acuity HENT:  Denies nasal congestion or sore throat Respiratory:  Denies cough, shortness of breath ,hemoptysis Cardiovascular:  Denies chest pain,palpatation,   orthopnea, Pnd or edema GI:  +abdominal pain, + nausea, +vomiting,  - diarrhea GU:  Denies urgency,frequency, dysuria, hematuria Musculoskeletal:  + back pain or joint pain Integument:  Denies rash ,sores, Neurologic:   Denies headache, focal weakness or   sensory changes Endocrine:  Denies polyuria or polydipsia Psychiatric:   Denies depression or anxiety     PHYSICAL EXAM     Vital Signs:  BP 161/69   Pulse 91   Temp(Src) 98.2  F (36.8  C) (Oral)   Resp 17   Ht 67 (170.2 cm)   Wt 350 lb (158.759 kg)   BMI 54.8 kg/m2   SpO2 99%   LMP 12/24/2011   Breastfeeding? No Constitutional:  Morbidly obese individual sitting up on the   bed     HENT:  Normocephalic, AtraumaticOropharynx moist, No oral exudates. Eyes:  PERRL, EOMI, Conjunctiva normal, No discharge. Lymphatic:  No lymphadenopathy noted. Neck: Normal range of motion, No tenderness, Supple, No stridor. Respiratory:  Decreased    breath sounds, No respiratory distress, No wheezing, No chest tenderness. Cardiovascular:  Normal heart rate, Normal rhythm, No murmurs, No rubs, No gallops. GI: Soft, epigastric and left upper quadrant tenderness, No masses, No organomegaly.  Bowel   sounds normal.  Ext :  Chronic lymphedema,No cyanosis, No clubbing. MUSCULOSKELETAL Good range of motion in all major joints. No tenderness to palpation or major deformities noted. Neurologic:  Alert & oriented x 3, Normal motor function, Normal sensory   function, No focal deficits noted.     labs     Recent Results (from the past 12 hour(s)) CBC WITH DIFFERENTIAL  Collection Time  05/25/12  3:55 PM     Result Value Range  WBC 9.9  3.6 - 10.5 THOU/mcL  RBC 4.58  3.80 - 5.20 MIL/mcL  HEMOGLOBIN 13.0  12.0 - 15.2 g/dL  HEMATOCRIT 38  36 - 46 %  MCV 82    82 - 97 fL  MCH 29  27 - 33 pg  MCHC 35  32 - 36 g/dL  RDW 13.2 (*) <44.0 %  PLATELET 350  140 - 375 THOU/mcL  ABS. NEUTROPHIL 6.84  1.80 - 7.70 THOU/mcL  ABS LYMPHS 2.34  1.00 - 4.00 THOU/mcL  ABS MONOS 0.45  0.20 - 0.90 THOU/mcL  ABS EOS 0.18  0.03 -   0.45 THOU/mcL  ABS BASOS 0.05  0.01 - 0.07 THOU/mcL  SEGS 68  LYMPHOCYTES 24  MONOCYTES 5  EOSINOPHIL 2  BASOPHILS 1 HEPATIC FUNCTION PANEL (TBIL,DBIL,ALK,AST,ALT,TP,ALB)  Collection Time  05/25/12  3:55 PM     Result Value Range  ALK PHOSPHATASE 92  35   - 135 IU/L  TOTAL PROTEIN 7.2  6.0 - 8.0 g/dL  ALBUMIN 3.7  3.5 - 5.0 g/dL  AST 40  10 - 40 IU/L  ALT 52  10 - 60 IU/L  TOTAL BILIRUBIN 1.1  0.0 - 1.2 mg/dL  DIRECT BILIRUBIN 0.1  0.0 - 0.2 mg/dL AMYLASE  Collection Time  05/25/12  3:55 PM     Result   Value Range  AMYLASE 106 (*) 28 - 100 U/L LIPASE  Collection Time  05/25/12  3:55 PM     Result Value Range  LIPASE 100 (*) 22 - 51 U/L PROTIME & INR  Collection Time  05/25/12  3:55 PM     Result Value Range  PROTIME 11.0  9.0 - 11.4 Seconds  INR 1.1    0.8 - 1.2 POCT METABOLIC PANEL - ISTAT  Collection Time  05/25/12  4:01 PM     Result Value Range  GLUCOSE 139 (*) 74 - 99 mg/dL  BLD UREA NITROGEN 9  8 - 26 mg/dL  CREATININE 0.7  0.5 - 1.2 mg/dL  SODIUM 102  725 - 366  mEq/L  POTASSIUM 3.8  3.6 - 5.1   mEq/L  CHLORIDE 104  101 - 111 mEq/L  CO2 TOTAL 25  24 - 36 mEq/L  IONIZED CALCIUM 4.7  4.6 - 5.4 mg/dL  ANION GAP 17  6 - 18   HEMOGLOBIN,CALC 12.6  12.0 - 15.0 g/dL  HEMATOCRIT 37  36 - 47 %PCV POCT GONADOT HCG QUALITATIVE  Collection Time  05/25/12    4:56 PM     Result Value Range  HCG,POC URINE NEGATIVE  NEGATIVE         IMAGING STUDIES & OTHER STUDIES Results reviewed. See reports.     Assessment Active Problems: 26 year old female presented with the chief complaint of epigastric pain and nausea     Epigastric pain gastritis versus pancreatitis History of polycystic ovarian disease History of recurrent cellulitis History  bipolar disorder History  of hypertension History of chronic lymphedema     PLAN     IV Dilaudid IV Zofran may need CAT scan of the abdomen if symptoms not improved by tomorrow IV protonix Patient not taking any medications for bipolar disorder or hypertension for last several months Patient followed by ID Dr Olena Leatherwood for recurrent cellulitis     Discussed with patient ,family and staff. Electronically signed by: Wilhemina Bonito, MD, 05/25/2012 7:08 PM         _________________________________  Signed by:    Wilhemina Bonito    SU    D: 05/25/2012 08:02 PM  T: 05/25/2012 08:02 PM    This document is confidential medical information.  Unauthorized disclosure or use of this information is prohibited by law.  If you are not the intended recipient of this document, please advise [place] by calling immediately [phone].

## 2012-05-26 DIAGNOSIS — R748 Abnormal levels of other serum enzymes: Secondary | ICD-10-CM | POA: Insufficient documentation

## 2012-05-26 NOTE — Unmapped (Signed)
Pt admitted 12/31 to [facility] with mild acute pancreatitis.

## 2012-05-27 NOTE — Unmapped (Signed)
EMERGENCY DEPARTMENT - ATTENDING NOTE     I have personally seen and examined this patient. I have fully participated in the care of this patient. I have reviewed and agree with all pertinent clinical information including history, physical exam, labs, radiographic studies and the plan. I have   also reviewed and agree with the medications, allergies and past medical history sections for this patient.     Valinda Party, MD     Valinda Party, MD 05/27/12 1004         _________________________________  Signed by:    Lesia Sago HUFF    HU    D: 05/27/2012 10:04 AM  T: 05/27/2012 10:04 AM    This document is confidential medical information.  Unauthorized disclosure or use of this information is prohibited by law.  If you are not the intended recipient of this document, please advise [place] by calling immediately [phone].

## 2012-05-27 NOTE — Unmapped (Signed)
[facility]         [facility]                Discharge summary     Patient Name:  Sarah [name]    DOB:  [birth year]    MEDICAL RECORD [id number]     Admit Date 05/25/2012     Discharge Date 05/27/2012     CODE STATUS Full Code     Primary care Physician Melvenia Needles     Attending Physician Katherine Basset, MD     CONSULTS ED CALL - OTHER     discharge DiagnosEs Principal Problem:   Abdominal pain POA: Yes Active Problems:   PCOS (polycystic ovarian syndrome) POA: Yes   Hypertension POA: Yes   Obesity, morbid (more than 100 lbs over ideal weight or BMI > 40) (HCC) POA: Yes   Edema POA: Yes     Elevated lipase--likely spurious POA: Unknown Resolved Problems:   * No resolved hospital problems. *     Procedures Radiologic procedures are listed in chronological order from admission XR ABDOMEN FLAT AND UPRIGHT  Final Result:      Limited due to morbid obesity.  No bowel obstruction.         [place] RIGHT UPPER QUADRANT  Final Result:   Hepatomegaly  Diffuse increase in echogenicity of the hepatic parenchymal pattern, findings suggest hepatic steatosis  Pancreas is obscured by bowel gas  Previous cholecystectomy, normal caliber common bile duct     US ABDOMEN LIMITED    (Canceled)         Significant labs Recent Results (from the past 36 hour(s)) CBC WITH DIFFERENTIAL  Collection Time  05/26/12  4:22 AM     Result Value Range  WBC 9.9  3.6 - 10.5 THOU/mcL  RBC 4.12  3.80 - 5.20 MIL/mcL  HEMOGLOBIN 11.7 (*) 12.0 - 15.2 g/dL  HEMATOCRIT 34   (*) 36 - 46 %  MCV 82  82 - 97 fL  MCH 28  27 - 33 pg  MCHC 35  32 - 36 g/dL  RDW 65.7 (*) <84.6 %  PLATELET 310  140 - 375 THOU/mcL  ABS. NEUTROPHIL 5.95  1.80 - 7.70 THOU/mcL  ABS LYMPHS 3.09  1.00 - 4.00 THOU/mcL  ABS MONOS 0.60  0.20 - 0.90 THOU/mcL    ABS EOS 0.20  0.03 - 0.45 THOU/mcL  ABS BASOS 0.07  0.01 - 0.07 THOU/mcL  SEGS 60  LYMPHOCYTES 31  MONOCYTES 6  EOSINOPHIL 2  BASOPHILS 1 BAMP (NA,K,CL,CO2,GLU,BUN,CREAT,CA)   Collection Time  05/26/12  4:22 AM     Result Value Range  BLD UREA NITROGEN 12    8 - 26 mg/dL  SODIUM 962  952 - 841 mEq/L  POTASSIUM 3.8  3.6 - 5.1 mEq/L  CHLORIDE 105  101 - 111 mEq/L  CO2 28  24 - 36 mEq/L  GLUCOSE, RANDOM 106 (*) 74 - 99 mg/dL  CREATININE 3.24  4.01 - 1.03 mg/dL  OSMOLALITY CALC 027  253 - 300 mOSM/kg  CALCIUM   8.5  8.5 - 10.5 mg/dL  GFR NON-AFRICAN AMER >66  >60 mL/min/1.73 sq meter  GFR AFRICAN AMERICAN >60  >60 mL/min/1.73 sq meter LIPASE  Collection Time  05/26/12  4:22 AM     Result Value Range  LIPASE 45  22 - 51 U/L AMYLASE  Collection Time  05/26/12  4:22   AM     Result Value Range  AMYLASE 62  28 - 100 U/L         Pending tests/issues     MEDICATION CHANGES Pepcid 20 mg twice a day over-the-counter for dyspepsia.     HOSPITAL COURSE The patient is a 27 year old year old female who was admitted for abdominal pain.  The patient had a minimally elevated lipase level at admission which was likely a spurious lab value.  A repeat lipase level the following day was normal.     The patient is not having nausea or vomiting.  She still has some mild abdominal pain when she moves.  She is otherwise stable.  Exam is unremarkable.     I have recommended Pepcid over-the-counter 20 mg twice a day for general dyspepsia.  I also advised the patient to avoid NSAIDs such as naproxen, ibuprofen etc.  These medications can upset her stomach and also are also can contribute to gastric ulcer   formation.     This patient is appropriate and stable for discharge.     DISCHARGE PHYSICAL EXAM BP 136/57   Pulse 79   Temp(Src) 98.5  F (36.9  C) (Oral)   Resp 18   Ht 67 (170.2 cm)   Wt 350 lb (158.759 kg)   BMI 54.8 kg/m2   SpO2 97%   LMP 12/24/2011   Breastfeeding? No The patient was seen and examined on the day of   discharge. Exam was notable for: Exam today is stable.  Abdomen is obese but unremarkable.  She is awake.  Alert and oriented.     CONDITION ON DISCHARGE Stable     Disposition Home     Discharge  Medications See after visit summary for full list of medications     follow up instructions     In 1 week         Thanks for allowing [place] to participate in this patient's care. Please call us at [phone] if there are any questions or concerns. Electronically signed by: Katherine Basset, MD, 1/2/20141:59 PM Total time on discharge and discharge planning was 31   minutes.                     _________________________________  Signed by:    Katherine Basset    ST    D: 05/27/2012 02:02 PM  T: 05/27/2012 02:02 PM    This document is confidential medical information.  Unauthorized disclosure or use of this information is prohibited by law.  If you are not the intended recipient of this document, please advise [place] by calling immediately [phone].

## 2012-05-27 NOTE — Unmapped (Signed)
For your records, the bill status(es) for Sarah [name] during their hospital stay is as follows: 05/25/2012 1516  Admission Emergency 05/25/2012 1920  Patient Update Observation 05/27/2012 1718  Discharge Observation     Any questions regarding MEDICAL RECORDS should be directed to Medical Records: GOOD [facility] [phone] or [facility] [phone]. Any questions regarding BILLING should be directed to the Care Management Departments: [facility] [phone] or [facility] [phone].         _________________________________  Signed byJamison Neighbor  NOTIFY    ZZ    D: 05/27/2012 05:18 PM  T:    This document is confidential medical information.  Unauthorized disclosure or use of this information is prohibited by law.  If you are not the intended recipient of this document, please advise [place] by calling immediately [phone].

## 2012-07-12 NOTE — Unmapped (Signed)
INPATIENT status was entered by Care Coordination.     Any questions regarding MEDICAL RECORDS should be directed to Medical Records: GOOD [facility] [phone] or [facility] [phone]. Any questions regarding BILLING should be directed to the Care Management Departments: [facility] [phone] or [facility] [phone].         _________________________________  Signed byJamison Neighbor  NOTIFY    ZZ    D: 07/12/2012 11:52 PM  T:    This document is confidential medical information.  Unauthorized disclosure or use of this information is prohibited by law.  If you are not the intended recipient of this document, please advise [place] by calling immediately [phone].

## 2012-07-13 DIAGNOSIS — D72829 Elevated white blood cell count, unspecified: Secondary | ICD-10-CM | POA: Insufficient documentation

## 2012-07-13 DIAGNOSIS — E876 Hypokalemia: Secondary | ICD-10-CM | POA: Insufficient documentation

## 2012-07-13 NOTE — Unmapped (Signed)
H/O recurrent cellulitis Missed her monthly bicillin injection On IV Vanc and Zosyn If clinically improve and bcx negative, may change to po abx soon for d/c     Patient seen and case discussed with Certified Nurse Practitioner. Agree with assessment and plans.     Jocelyn K. Regino Schultze, MD 07/13/2012         _________________________________  Signed by:    [employer]    D: 07/13/2012 04:44 PM  T: 07/13/2012 04:44 PM    This document is confidential medical information.  Unauthorized disclosure or use of this information is prohibited by law.  If you are not the intended recipient of this document, please advise [place] by calling immediately [phone].

## 2012-07-13 NOTE — Unmapped (Addendum)
History & Physical Note     Patient: Sarah [name] MRN: [id number] Date of Birth: 07-15-1985  Age: 27 year old  Sex: female Unit: [facility] EMERGENCY DEPT Room/Bed: 25/25 Location: [facility]     Admitting Physician:   Virgilio Belling Primary Care Physician: Melvenia Needles     Subjective:     Chief Complaint: Fevers     History of present illness: Sarah [name] is a 27 year old with a past medical history significant for polycystic ovarian syndrome, hypertension, morbid obesity and recurrent cellulitis.  She presents to the emergency department this past evening with   complaints of fevers and right lower leg redness.  According to the patient she woke up yesterday morning with sudden onset fevers.  She tells me her highest fever was 102.  Additionally, later this evening she noted, which she describes as a rather   abrupt onset, of redness to the right lower extremity.  She does not recall seeing any redness earlier this morning however by the evening there was notable redness from the knee down to the mid foot on the right side.     According to records, this patient has had a minimum of 20 episodes of cellulitis of the lower extremities it started when she was a sophomore in high school.  She has been followed by Dr. Olena Leatherwood for  beta-hemolytic streptococcus group A.  She typically   receives high-dose IV penicillin and clindamycin. Additionally she is to receive antibiotic suppression in the form of Bicillin monthly.  She is unclear as to when her last injection was in she thinks it was on last admission which was December of 2013.    I can find no note of this. Additionally she tells me she was due to be seen by Dr. Olena Leatherwood today for her injection.     She denies chest pain and shortness of breath.  She does report nausea with vomiting x2 episodes prior to her arrival in the emergency department.  She has had no events since arrival.  She is requesting Dilaudid IV for left groin pain. She tells me      this is typically associated with her recurrent bouts of cellulitis.  She does have an area on the left upper thigh that appears streaky but runs horizontally versus vertically, similar to scratch marks.  We Will monitor this.  Additionally she complains   of bilateral ear pain, throbbing. Onset was with fevers.     Her white count in the emergency department was 17.1.  She is hypertensive with low-grade fevers noted at 99.2.  She was given Zosyn in the emergency department.  Dr. Wilson Singer has requested the emergency room physician contact Dr. Olena Leatherwood for treatment   direction.Dr Ciro Backer returned call and suggested that we keep her on Vanc and  zosyn until Ng sees her in the am. Past Medical History Diagnosis Date   PCOS (polycystic ovarian syndrome)   Encarnacion Chu CNP   Hypertension   Bipolar affective disorder (HCC)     Dr. Marnette Burgess   Cellulitis and abscess of leg   recurrent cellulitis   Edema   Morbid obesity (HCC)   Unspecified essential hypertension   Depressive disorder, not elsewhere classified     Past Surgical History Procedure Laterality Date   Lap,cholecystectomy  2003   [facility]   Cholecystectomy with/without common duct exploration     Family History Problem Relation Age of Onset   pulm embolus[other] [OTHER] Sister  PCOS[other] [OTHER] Sister   Hypertension Mother   Diabetes Father   Hypertension Father     History Substance Use Topics   Smoking status: Never Smoker   Smokeless tobacco: Never Used   Alcohol Use: 0.5 oz/week   1 Shots of liquor per week    Comment: socially,     Prior to Admission medications Medication Sig Start Date End Date Taking? Authorizing Provider famotidine (PEPCID) 20 MG TABS Take 1 tablet by mouth 2 (two) times daily. 05/27/12 07/13/12  Katherine Basset, MD lisinopril (PRINIVIL,ZESTRIL) 10 MG TABS Take 10   mg by mouth daily.  07/13/12 Med, Historical Penicillin G Benzathine & Proc 1200000 UNIT/2ML SUSP by Intramuscular route. 07/13/12  Med, Historical     No Known Allergies     Review of Systems  Constitutional: Positive for fever, chills and fatigue. HENT: Positive for ear pain. Eyes: Negative. Respiratory: Negative. Cardiovascular: Positive for leg swelling. Gastrointestinal: Positive for nausea and vomiting. Endocrine:   Negative. Genitourinary: Negative. Musculoskeletal: Negative. Skin: Positive for wound.      RLE cellulitis Allergic/Immunologic: Negative. Neurological: Negative. Hematological: Negative. Psychiatric/Behavioral: Negative.     Objective:     BP 126/74   Pulse 86   Temp(Src) 99.2  F (37.3  C) (Oral)   Resp 18   SpO2 95%   LMP 12/11/2011     Physical Exam Nursing note and vitals reviewed. Constitutional: She is oriented to person, place, and time. She appears well-developed and well-nourished. No distress. Obese     HENT: Head: Normocephalic and atraumatic. Right Ear: External ear normal. Left Ear: External ear normal. Nose: Nose normal. Mouth/Throat: Oropharynx is clear and moist. No oropharyngeal exudate. Eyes: Conjunctivae and EOM are normal. Pupils are equal,   round, and reactive to light. Right eye exhibits no discharge. Left eye exhibits no discharge. No scleral icterus. Neck: Normal range of motion. Neck supple. No JVD present. Cardiovascular: Normal rate, regular rhythm, normal heart sounds and intact   distal pulses.  Exam reveals no gallop and no friction rub. No murmur heard. Pulmonary/Chest: Breath sounds normal. No respiratory distress. She has no wheezes. She has no rales. Abdominal: Soft. Bowel sounds are normal. She exhibits no distension. There   is no tenderness. There is no guarding. Musculoskeletal: Normal range of motion. She exhibits edema and tenderness. RLE Lymphadenopathy:   She has no cervical adenopathy. Neurological: She is alert and oriented to person, place, and time. Skin: Skin is   warm and dry. Rash noted. She is not diaphoretic. There is erythema. RLE Psychiatric: She has a normal mood and affect. Her behavior is normal. Judgment and thought content  normal.     Labs: Recent Results (from the past 24 hour(s)) CBC WITH DIFFERENTIAL  Collection Time   07/13/12 12:15 AM     Result Value Range  WBC 17.1 (*) 3.6 - 10.5 THOU/mcL  RBC 4.52  3.80 - 5.20 MIL/mcL  HEMOGLOBIN 12.4  12.0 - 15.2 g/dL  HEMATOCRIT 37  36 - 46   %  MCV 82  82 - 97 fL  MCH 28  27 - 33 pg  MCHC 33  32 - 36 g/dL  RDW 16.1 (*) <09.6 %  PLATELET 296  140 - 375 THOU/mcL  ABS. NEUTROPHIL 15.31 (*) 1.80 - 7.70 THOU/mcL  ABS LYMPHS 1.15  1.00 - 4.00 THOU/mcL  ABS MONOS 0.61  0.20 - 0.90 THOU/mcL  ABS   EOS 0.00 (*) 0.03 - 0.45 THOU/mcL  ABS BASOS 0.07  0.01 -  0.07 THOU/mcL  SEGS 89  LYMPHOCYTES 7  MONOCYTES 4  EOSINOPHIL 0  BASOPHILS 0 LACTIC ACID  Collection Time   07/13/12 12:15 AM     Result Value Range  LACTIC ACID 1.6  0.5 - 2.2 mmol/L POCT   METABOLIC PANEL - ISTAT  Collection Time   07/13/12 12:31 AM     Result Value Range  GLUCOSE 103 (*) 74 - 99 mg/dL  BLD UREA NITROGEN 10  8 - 26 mg/dL  CREATININE 0.8  0.5 - 1.2 mg/dL  SODIUM 161  096 - 045 mEq/L  POTASSIUM 3.3 (*) 3.6 - 5.1 mEq/L    CHLORIDE 102  101 - 111 mEq/L  CO2 TOTAL 24  24 - 36 mEq/L  IONIZED CALCIUM 4.7  4.6 - 5.4 mg/dL  ANION GAP 17  6 - 18  HEMOGLOBIN,CALC 12.6  12.0 - 15.0 g/dL  HEMATOCRIT 37  36 - 47 %PCV         Assessment:     Principal Problem:   Cellulitis and abscess of leg Active Problems:   Obesity, morbid (more than 100 lbs over ideal weight or BMI > 40) (HCC)   Leukocytosis   HTN (hypertension)   Hypokalemia     Plan:     Cellulitis Consult Infectious disease Cbc w/ diff in am Bld cx Vanc/Zosyn -per dr Ciro Backer Percocet prn: leg pain Zofran     HTN Monitor and trend BP     Other KDUR x 1 Bmp in AM DVT prophylaxis: heparin SQ Q12     Pt is a full code     Signed By: Ashley Akin, CNP  July 13, 2012         Patient seen and examined. Case discussed with ACNP. We reviewed the key portions of history, exam, assessment and plan of care I have personally performed a face to face diagnostic evaluation on this  patient.  My findings are as follows.  Se presents   with a history of recurrent cellulitis and sudden onset fever, chills and RLE erythema, warmth, and edema. Exam findings include Morbid Obesity, RLE cellulitic rash.  I have reviewed and agree with the care plan as outlined above.     Electronically signed by: Princess Perna, MD, 07/13/2012 2:25 AM             _________________________________  Signed by:    Princess Perna    SA    D: 07/13/2012 02:26 AM  T: 07/13/2012 02:26 AM    This document is confidential medical information.  Unauthorized disclosure or use of this information is prohibited by law.  If you are not the intended recipient of this document, please advise [place] by calling immediately [phone].

## 2012-07-13 NOTE — Unmapped (Signed)
Infectious Disease Consultation      Patient Name:  Sarah [name]  DOB:  Mar 17, 1986   MEDICAL RECORD [id number]  Room:  B6202/01  LOS: 1 day     Reason for Consultation: RECURRENT CELLULITIS History of Present Illness: Sarah [name] is a 27 year old female with a hx of bipolar disorder and polycystic ovarian syndrome who was admitted for right lower leg cellulitis that started early Monday   morning.  She states she had a fever as high as 102F as well as vomiting.  This is a recurring problem for her, states has had about 30 bouts of cellulitis.  She is followed by Dr. Olena Leatherwood and receives monthly PCN injections.  She was last admitted in April   2013 and responded to IV PCN and Clindamycin.     Review of Systems: See HPI, all other systems reviewed and negative.     Allergies: Review of patient's allergies indicates no known allergies.     Past Medical History: Past Medical History Diagnosis Date   PCOS (polycystic ovarian syndrome)   Encarnacion Chu CNP   Hypertension   Bipolar affective disorder (HCC)   Dr. Marnette Burgess   Cellulitis and abscess of leg   recurrent cellulitis   Edema   Morbid   obesity (HCC)   Unspecified essential hypertension   Depressive disorder, not elsewhere classified     Past Surgical History: Past Surgical History Procedure Laterality Date   Lap,cholecystectomy  2003   [facility]   Cholecystectomy with/without common duct exploration     Social History: History     Social History   Marital Status: Married   Spouse Name: N/A   Number of Children: N/A   Years of Education: N/A     Occupational History   Not on file.     Social History Main Topics   Smoking status: Never Smoker   Smokeless tobacco: Never Used   Alcohol Use: 0.5 oz/week   1 Shots of liquor per week    Comment: socially,   Drug Use: No   Sexually Active: Yes     Other Topics Concern   Not on file     Social History Narrative   No narrative on file     Family History: Family History Problem Relation Age of Onset   pulm embolus[other] [OTHER]  Sister   PCOS[other] [OTHER] Sister   Hypertension Mother   Diabetes Father   Hypertension Father         Vitals: Patient Vitals for the past 24 hrs:  BP Temp Temp src Pulse Resp SpO2 Height Weight 07/13/12 0817 131/75 mmHg 98.3  F (36.8  C) Oral 77 18 100 % 67 (170.2 cm) 350 lb (158.759 kg) 07/13/12 0318 126/78 mmHg 98.4  F (36.9  C) Oral 83 19 96 % - -   07/13/12 0250 120/71 mmHg 98.3  F (36.8  C) Oral 80 16 97 % - - 07/13/12 0145 126/74 mmHg - - 86 18 - - - 07/13/12 0030 134/77 mmHg - - 88 18 95 % - - 07/12/12 2359 120/56 mmHg 99.2  F (37.3  C) Oral 94 18 - - -         Labs: Recent Results (from the past 12 hour(s)) CBC WITH DIFFERENTIAL  Collection Time   07/13/12 12:15 AM     Result Value Range  WBC 17.1 (*) 3.6 - 10.5 THOU/mcL  RBC 4.52  3.80 - 5.20 MIL/mcL  HEMOGLOBIN 12.4  12.0 - 15.2  g/dL  HEMATOCRIT 37  36 - 46   %  MCV 82  82 - 97 fL  MCH 28  27 - 33 pg  MCHC 33  32 - 36 g/dL  RDW 54.0 (*) <98.1 %  PLATELET 296  140 - 375 THOU/mcL  ABS. NEUTROPHIL 15.31 (*) 1.80 - 7.70 THOU/mcL  ABS LYMPHS 1.15  1.00 - 4.00 THOU/mcL  ABS MONOS 0.61  0.20 - 0.90 THOU/mcL  ABS   EOS 0.00 (*) 0.03 - 0.45 THOU/mcL  ABS BASOS 0.07  0.01 - 0.07 THOU/mcL  SEGS 89  LYMPHOCYTES 7  MONOCYTES 4  EOSINOPHIL 0  BASOPHILS 0 LACTIC ACID  Collection Time   07/13/12 12:15 AM     Result Value Range  LACTIC ACID 1.6  0.5 - 2.2 mmol/L POCT   METABOLIC PANEL - ISTAT  Collection Time   07/13/12 12:31 AM     Result Value Range  GLUCOSE 103 (*) 74 - 99 mg/dL  BLD UREA NITROGEN 10  8 - 26 mg/dL  CREATININE 0.8  0.5 - 1.2 mg/dL  SODIUM 191  478 - 295 mEq/L  POTASSIUM 3.3 (*) 3.6 - 5.1 mEq/L    CHLORIDE 102  101 - 111 mEq/L  CO2 TOTAL 24  24 - 36 mEq/L  IONIZED CALCIUM 4.7  4.6 - 5.4 mg/dL  ANION GAP 17  6 - 18  HEMOGLOBIN,CALC 12.6  12.0 - 15.0 g/dL  HEMATOCRIT 37  36 - 47 %PCV BAMP (NA,K,CL,CO2,GLU,BUN,CREAT,CA)  Collection Time   07/13/12    6:37 AM     Result Value Range  BLD UREA NITROGEN 11  8 - 26 mg/dL  SODIUM 621  308 - 657 mEq/L   POTASSIUM 3.3 (*) 3.6 - 5.1 mEq/L  CHLORIDE 104  101 - 111 mEq/L  CO2 24  24 - 36 mEq/L  GLUCOSE, RANDOM 108 (*) 74 - 99 mg/dL  CREATININE 8.46  9.62 - 1.03   mg/dL  OSMOLALITY CALC 952 (*) 280 - 300 mOSM/kg  CALCIUM 8.4 (*) 8.5 - 10.5 mg/dL  GFR NON-AFRICAN AMER >84  >60 mL/min/1.73 sq meter  GFR AFRICAN AMERICAN >60  >60 mL/min/1.73 sq meter CBC WITH DIFFERENTIAL  Collection Time   07/13/12  6:37 AM       Result Value Range  WBC 10.8 (*) 3.6 - 10.5 THOU/mcL  RBC 4.28  3.80 - 5.20 MIL/mcL  HEMOGLOBIN 12.0  12.0 - 15.2 g/dL  HEMATOCRIT 36  36 - 46 %  MCV 84  82 - 97 fL  MCH 28  27 - 33 pg  MCHC 34  32 - 36 g/dL  RDW 13.2 (*) <44.0 %  PLATELET 253  140 - 375   THOU/mcL  ABS. NEUTROPHIL 8.72 (*) 1.80 - 7.70 THOU/mcL  ABS LYMPHS 1.39  1.00 - 4.00 THOU/mcL  ABS MONOS 0.58  0.20 - 0.90 THOU/mcL  ABS EOS 0.02 (*) 0.03 - 0.45 THOU/mcL  ABS BASOS 0.05  0.01 - 0.07 THOU/mcL  SEGS 82  LYMPHOCYTES 13  MONOCYTES 5    EOSINOPHIL 0  BASOPHILS 0 HEPATIC FUNCTION PANEL (TBIL,DBIL,ALK,AST,ALT,TP,ALB)  Collection Time   07/13/12  6:37 AM     Result Value Range  ALK PHOSPHATASE 73  35 - 135 IU/L  TOTAL PROTEIN 6.7  6.0 - 8.0 g/dL  ALBUMIN 3.4 (*) 3.5 - 5.0 g/dL  AST 20  10   - 40 IU/L  ALT 30  10 - 60 IU/L  TOTAL BILIRUBIN 2.0 (*) 0.0 - 1.2 mg/dL  DIRECT BILIRUBIN 0.3 (*) 0.0 - 0.2 mg/dL     Culture:CULT   NO GROWTH AT 5 DAYS   03/26/2012 CULT   Tested at [facility] [address] [phone]   03/26/2012 CULT   NO GROWTH AT 5 DAYS   03/26/2012 CULT   Tested at [facility] [address] [phone]   03/26/2012 CULT   NO GROWTH AT   5 DAYS   05/15/2011 CULT   Tested at [facility] [address] [phone]   05/15/2011 CULT   NO GROWTH AT 5 DAYS   05/15/2011 CULT   Tested at [facility] [address] [phone]   05/15/2011 CULT   NO GROWTH AT 5 DAYS   03/15/2011   CULT   Tested at [facility] [address] [phone]   03/15/2011     Current Medications: No Facility-Administered Medications for the  07/12/12 encounter [facility] Encounter).     No outpatient prescriptions have been marked as taking for the 07/12/12 encounter [facility] Encounter).     Physical Exam: General: Obese female in NAD Lungs:Clear A&P CV:RRR, no murmur gallops or rubs Abd: Soft, non-tender, non-distended. Extremities:Right lower leg red/warm from toes to just below knee, marked     ASSESSMENT Cellulitis, right lower extremity, recurrent Hx of polycystic ovarian disorder and bipolar     RECOMMENDATIONS Continue Vanc and Zosyn MRSA screen Alger Memos, CNP         _________________________________  Signed by:    Alger Memos    10    D: 07/13/2012 11:44 AM  T: 07/13/2012 11:44 AM    This document is confidential medical information.  Unauthorized disclosure or use of this information is prohibited by law.  If you are not the intended recipient of this document, please advise [place] by calling immediately [phone].

## 2012-07-13 NOTE — Unmapped (Signed)
ED PATIENT ENCOUNTER ARRIVAL: 07/12/12 2352 CSN: [id number]     HAR: [id number]     EMERGENCY DEPARTMENT ENCOUNTER     CHIEF COMPLAINT: Chief Complaint Patient presents with   Leg Swelling     HPI: Sarah [name] is a 27 year old female who presents complaining of skin redness. The redness is located on the right lower extremity involves the dorsal surface of her right leg almost to the level of the knee. Patient reports that she noticed   discomfort with mild pain at approximately 1 AM yesterday morning but has had increasing symptoms of redness. She gets chronic and recurrent cellulitis and usually is maintained prophylactically on a penicillin injection she gets monthly. She states she   was due to get an injection today when the symptoms developed. She states her fevers been as high as 102 and she has alternated between Motrin and Tylenol to control the symptoms. These symptoms have been going on 10 years intermittently and she reports   more than 30 bouts ofcellulitis which have required extensive management.  The patient rates associated pain at a 8 out of 10.  The pain is described as a constant ache. There has been associated increased warmth of the area.  Patient denies chest pain   or shortness of breath. She denies abdominal pain. She denies dysuria, urgency or frequency. She has a baseline report of lymphedema in her bilateral extremities. She states she has been under the active care of Dr. Olena Leatherwood for this chronic and recurrent   cellulitis.     REVIEW OF SYSTEMS: As in the HPI.  All other systems are reviewed and are negative for finding.     PAST MEDICAL HISTORY: Past Medical History Diagnosis Date   PCOS (polycystic ovarian syndrome)   Encarnacion Chu CNP   Hypertension   Bipolar affective disorder (HCC)   Dr. Marnette Burgess   Cellulitis and abscess of leg   recurrent cellulitis   Edema   Morbid   obesity (HCC)   Unspecified essential hypertension   Depressive disorder, not elsewhere classified     SURGICAL  HISTORY: Past Surgical History Procedure Laterality Date   Lap,cholecystectomy  2003   [facility]   Cholecystectomy with/without common duct exploration     CURRENT MEDICATIONS: Prior to Admission medications Medication Sig Start Date End Date Taking? Authorizing Provider famotidine (PEPCID) 20 MG TABS Take 1 tablet by mouth 2 (two) times daily. 05/27/12 07/13/12  Katherine Basset, MD lisinopril   (PRINIVIL,ZESTRIL) 10 MG TABS Take 10 mg by mouth daily.  07/13/12 Med, Historical Penicillin G Benzathine & Proc 1200000 UNIT/2ML SUSP by Intramuscular route. 07/13/12  Med, Historical     ALLERGIES: No Known Allergies     FAMILY HISTORY: Family History Problem Relation Age of Onset   pulm embolus[other] [OTHER] Sister   PCOS[other] [OTHER] Sister   Hypertension Mother   Diabetes Father   Hypertension Father     SOCIAL HISTORY: History     Social History   Marital Status: Married   Spouse Name: N/A   Number of Children: N/A   Years of Education: N/A     Social History Main Topics   Smoking status: Never Smoker   Smokeless tobacco: Never Used   Alcohol Use: 0.5 oz/week   1 Shots of liquor per week    Comment: socially,   Drug Use: No   Sexually Active: Yes     Other Topics Concern   None     Social History Narrative  None     PHYSICAL EXAMINATION: VITAL SIGNS: BP 134/77   Pulse 88   Temp(Src) 99.2  F (37.3  C) (Oral)   Resp 18   SpO2 95%   LMP 12/11/2011 Constitutional: Well-developed, well-nourished, 27 year old female in no acute distress. HEENT: Atraumatic, normocephalic,   PERRLA; non-icteric and non-injected. EOMI. Normal hearing. Normal appearing nose. Moist mucous membranes. Respiratory: No respiratory distress. Lungs clear to auscultation bilaterally. No wheezes, rhonchi or rhales. Cardiovascular: RRR, no murmurs,   gallops or rubs. GI: Abdomen soft, NT/ND. No rigidity, rebound, or guarding. Normal bowel sounds. No CVA tenderness. Musculoskeletal: Able to move all extremities. No joint tenderness or effusions. Integument:  Erythema and increased warmth of the dorsal   surface of the right lower extremity extremity which extends just proximal to 4 fingerbreadths below the level of the joint. There is associated tenderness to touch. There is associated hyperthermia. Neurologic: Alert and oriented x 3. No gross motor or   sensory deficits noted. Psychological: Of appropriate mood and affect and an excellent historian.     LABORATORY: Recent Results (from the past 24 hour(s)) CBC WITH DIFFERENTIAL  Collection Time   07/13/12 12:15 AM     Result Value Range  WBC 17.1 (*) 3.6 - 10.5 THOU/mcL  RBC 4.52  3.80 - 5.20 MIL/mcL  HEMOGLOBIN 12.4  12.0 - 15.2 g/dL  HEMATOCRIT 37    36 - 46 %  MCV 82  82 - 97 fL  MCH 28  27 - 33 pg  MCHC 33  32 - 36 g/dL  RDW 09.8 (*) <11.9 %  PLATELET 296  140 - 375 THOU/mcL  ABS. NEUTROPHIL 15.31 (*) 1.80 - 7.70 THOU/mcL  ABS LYMPHS 1.15  1.00 - 4.00 THOU/mcL  ABS MONOS 0.61  0.20 - 0.90 THOU/mcL    ABS EOS 0.00 (*) 0.03 - 0.45 THOU/mcL  ABS BASOS 0.07  0.01 - 0.07 THOU/mcL  SEGS 89  LYMPHOCYTES 7  MONOCYTES 4  EOSINOPHIL 0  BASOPHILS 0 LACTIC ACID  Collection Time   07/13/12 12:15 AM     Result Value Range  LACTIC ACID 1.6  0.5 - 2.2 mmol/L POCT   METABOLIC PANEL - ISTAT  Collection Time   07/13/12 12:31 AM     Result Value Range  GLUCOSE 103 (*) 74 - 99 mg/dL  BLD UREA NITROGEN 10  8 - 26 mg/dL  CREATININE 0.8  0.5 - 1.2 mg/dL  SODIUM 147  829 - 562 mEq/L  POTASSIUM 3.3 (*) 3.6 - 5.1 mEq/L    CHLORIDE 102  101 - 111 mEq/L  CO2 TOTAL 24  24 - 36 mEq/L  IONIZED CALCIUM 4.7  4.6 - 5.4 mg/dL  ANION GAP 17  6 - 18  HEMOGLOBIN,CALC 12.6  12.0 - 15.0 g/dL  HEMATOCRIT 37  36 - 47 %PCV         ED COURSE & MEDICAL DECISION MAKING: Pertinent laboratory and diagnostic studies performed in the ED have been reviewed. (See EMR chart for full details).     The patient was given the following medication in the Emergency department:     Medication Administration from 07/12/2012 2352 to 07/13/2012 0142    Date/Time Order Dose  Route Action Action by Comments   07/13/2012 0035 piperacillin-tazobactam (ZOSYN) injection 3.375 g 3.375 g Intravenous Given Dorthy Cooler   07/13/2012 0111   potassium chloride SA (K-DUR,KLOR-CON) CR tablet 40 mEq 40 mEq Oral Given Alphonsa Overall   07/13/2012 0136 promethazine (  PHENERGAN) 12.5 mg in sodium chloride 0.9 % 50 mL IVPB 12.5 mg Intravenous New Bag Everlene Other   07/13/2012 0136 HYDROmorphone   (DILAUDID) injection 1 mg 1 mg Intravenous Given Everlene Other         Medical decision making: Sarah [name] is a 27 year old female who presents with erythema of the right lower extremity consistent with recurrent cellulitis. Patient was noted to be febrile at home to 102 and did initiate treatment with the Tylenol and   Advil. Her last dose being at approximately 9 PM. We did proceed with laboratory testing as well as blood cultures. She was medicated here in the emergency department with Dilaudid and Phenergan with good pain relief. Her CBC demonstrates a leukocytosis   at 17.1 with absolute neutrophil count of 15.3. Hemoglobin hematocrit and platelet count are noted to be normal. Her metabolic panel demonstrates a glucose of 103 otherwise normal with the exception of mild hypokalemia of 3.3 which was repleted here in   the emergency department. The patient's lactic acid was documented at 1.6 and her blood cultures are pending. We did initiate treatment after reviewing with both the patient as well as her medical records with Zosyn for her chronic and recurrent   cellulitis which appears most responsive to penicillin-based medications. Once the test results were made available we did contact Dr. Wilson Singer and the case was discussed. The patient will be admitted to MedSurg for inpatient management of her chronic and   recurrent cellulitis. We did, at his request,  place a telephone call to Dr. Olena Leatherwood. Dr. Erma Heritage was covering her calls. Patient's past medical history as well as records and prophylactic  antibiotics were discussed and he agrees that initiating   treatment with Zosyn seems very appropriate at the present time.     Disposition: Admit MedSurg     Final diagnosis: 1. Cellulitis of right lower extremity 2. Fever     Priem, Shawn M., DO provided face to face time with the patient today in the emergency department and agrees with the above mentioned diagnosis, prognosis and proposed treatment plan.     Electronically signed by: Wille Celeste. Griselda Miner, PA             Denyce Robert., PA 07/13/12 0231         _________________________________  Signed by:    Denyce Robert    UR    D: 07/13/2012 02:31 AM  T: 07/13/2012 02:31 AM    This document is confidential medical information.  Unauthorized disclosure or use of this information is prohibited by law.  If you are not the intended recipient of this document, please advise [place] by calling immediately [phone].

## 2012-07-15 NOTE — Unmapped (Signed)
Patient was seen and evaluated by myself. Patient has cellulitis of the right lower extremity. The patient has a history of this in the past. The patient states that she had a fever earlier today.     EMERGENCY DEPARTMENT - ATTENDING NOTE     I have personally seen and examined this patient. I have fully participated in the care of this patient. I have reviewed and agree with all pertinent clinical information including history, physical exam, labs, radiographic studies and the plan. I have   also reviewed and agree with the medications, allergies and past medical history sections for this patient.     Shawn Priem D.O.     Acquanetta Chain., DO 07/15/12 1103         _________________________________  Signed by:    Acquanetta Chain    PR    D: 07/15/2012 11:03 AM  T: 07/15/2012 11:03 AM    This document is confidential medical information.  Unauthorized disclosure or use of this information is prohibited by law.  If you are not the intended recipient of this document, please advise [place] by calling immediately [phone].

## 2012-07-15 NOTE — Unmapped (Signed)
For your records, the bill status(es) for TANYIAH LAURICH during their hospital stay is as follows: 07/12/2012 2358  Admission Emergency 07/13/2012 0132  Patient Update Inpatient 07/15/2012 1726  Discharge Inpatient     Any questions regarding MEDICAL RECORDS should be directed to Medical Records: [facility] [phone] or [facility] [phone]. Any questions regarding BILLING should be directed to the Care Management Departments: [facility] [phone] or [facility] [phone].         _________________________________  Signed byJamison Neighbor  NOTIFY    ZZ    D: 07/15/2012 05:26 PM  T:    This document is confidential medical information.  Unauthorized disclosure or use of this information is prohibited by law.  If you are not the intended recipient of this document, please advise [place] by calling immediately [phone].

## 2012-07-15 NOTE — Unmapped (Signed)
[facility]         [facility] hospital                Discharge summary     Patient Name:  Sarah [name]    DOB:  1986/04/14    MEDICAL RECORD [id number]     Admit Date 07/12/2012     Discharge Date 07/15/2012     CODE STATUS Full Code     Primary care Physician Melvenia Needles     Attending Physician Marena Chancy, MD     CONSULTS ED CALL - OTHER IP CONSULT TO INFECTIOUS DISEASES IP CONSULT TO INFECTIOUS DISEASES     discharge DiagnosEs Principal Problem:   Cellulitis and abscess of leg POA: Yes Active Problems:   Obesity, morbid (more than 100 lbs over ideal weight or BMI > 40) (HCC) POA: Yes   Leukocytosis POA: Yes   HTN (hypertension) POA: Yes   Hypokalemia POA:   Yes Resolved Problems:   * No resolved hospital problems. *     Procedures Radiologic procedures are listed in chronological order from admission         Significant labs none     Pending tests/issues none     MEDICATION CHANGES Per AVS     HOSPITAL COURSE The patient is a 27 year old year old female who was admitted for fever and lower extremity erythema.  The patient has a very long-standing history of profound morbid obesity and multiple recurrent lower extremity cellulitis.  She was   admitted to the hospital and started on IV antibiotics.  She was seen in consultation by infectious diseases.  She initially was also started on low-level oral narcotics.  This did not seem to control the pain appropriately in the patient on more than   one occasion requested IV narcotic pain medication. This was refused.  The appearance of her leg improved.  She remained afebrile throughout her stay.  Her white blood cell count was normal on the day of discharge.  She will be dismissed today on   Augmentin with instructions to followup with infectious diseases.     DISCHARGE PHYSICAL EXAM BP 122/79   Pulse 102   Temp(Src) 98.3  F (36.8  C) (Oral)   Resp 18   Ht 67 (170.2 cm)   Wt 350 lb (158.759 kg)   BMI 54.8 kg/m2    SpO2 98%   LMP 12/11/2011 The patient was seen and examined on the day of discharge.     CONDITION ON DISCHARGE Stable     Disposition Home     Discharge Medications See after visit summary for full list of medications     follow up instructions Melvenia Needles, MD [address] [place] [address] [phone]     In 1 [place] [address] [address] [place] [address] [phone]     In 2 weeks         Thanks for allowing [place] to participate in this patient's care. Please call us at [phone] if there are any questions or concerns. Electronically signed by: Marena Chancy, MD, 2/20/201411:34 AM Total time on discharge and discharge planning was 35   minutes.                     _________________________________  Signed by:    Red Christians    D: 07/15/2012 11:36 AM  T: 07/15/2012 11:36 AM    This document is confidential medical information.  Unauthorized disclosure or use of this information is prohibited by law.  If you are not the intended recipient of this document, please advise [place] by calling immediately [phone].

## 2012-07-22 ENCOUNTER — Ambulatory Visit: Admit: 2012-07-22 | Payer: PRIVATE HEALTH INSURANCE

## 2012-07-22 DIAGNOSIS — L03115 Cellulitis of right lower limb: Secondary | ICD-10-CM

## 2012-07-22 MED ORDER — promethazine-codeine (PHENERGAN WITH CODEINE) 6.25-10 mg/5 mL syrup
6.25-10 | Freq: Three times a day (TID) | ORAL | 0.00 refills | 8.00000 days | Status: AC | PRN
Start: 2012-07-22 — End: 2012-08-01

## 2012-07-22 NOTE — Unmapped (Signed)
Subjective  HPI:   Patient ID: Sarah [name] is a 27 y.o. female.    Chief Complaint: Hospital discharge  HPI         She was admitted to [facility] from 07/12/2012 to 07/15/2012 for R leg cellulitis. She got discharged Augmentin 875 mg bid for 2 weeks that she is taking. Cellulitis has greatly improved. There is pain in right calf. Swelling increased since she returned to work. Had been to lymphedema clinic but cannot tolerate compression stockings due to pain.    BP is high today. Normally on Lisinopril 10 mg daily but she hasn't taken it since 11/2011 because she's been off OCP which caused elevated BP.    Since a week, she has congestion, cough, SOB. No fevers. Has sore throat and post nasal dripping. Has nausea but no vomiting.     ROS:   Review of Systems - per HPI       Objective:   Physical Exam   Vitals reviewed.  Constitutional: She is oriented to person, place, and time. No distress.   Morbidly obese   HENT:   Head: Normocephalic and atraumatic.   Right Ear: External ear normal.   Left Ear: External ear normal.   Mouth/Throat: Oropharynx is clear and moist. No oropharyngeal exudate.   Cardiovascular: Normal rate, regular rhythm and normal heart sounds.    Pulmonary/Chest: Effort normal and breath sounds normal. No respiratory distress.   She has a dry cough.   Neurological: She is alert and oriented to person, place, and time.   Skin:   RLE is swollen but no erythema, warmth. There is mild tenderness.             Filed Vitals:    07/22/12 1546   BP: 154/78   Temp: 98.6 ??F (37 ??C)   TempSrc: Oral   Height: 5' 7 (1.702 m)   Weight: 348 lb (157.852 kg)     Body mass index is 54.49 kg/(m^2).  Body surface area is 2.73 meters squared.                Assessment/Plan:     1. RLE cellulitis  - Improving. Complete Augmentin course. She also sees ID regularly.    2. URI  - Likely viral - a lot of this active right now. Usual duration is 10-14 days and it does not respond to antibiotics. Recommend rest + fluids + APAP or NSAID  as needed for pain/fever and call with problems.  - Will give Phenergan/Codeine for cough. The patient has been instructed not to drive or work after taking this medication as it will make her sleepy.

## 2012-08-03 MED ORDER — lisinopril (PRINIVIL,ZESTRIL) 10 MG tablet
10 | ORAL_TABLET | Freq: Every day | ORAL | Status: AC
Start: 2012-08-03 — End: 2012-12-24

## 2012-08-03 NOTE — Unmapped (Signed)
Pt was in to see Dr. Allena Katz on 07/22/2012. Pt's BP was high. Yesterday it was 149/94.   Pt used to take Lisinopril but stopped with BC.  Pt thought she had some lisinopril but does not. Pt discussed taking this with Dr. Allena Katz on 07/22/2012. Pt would like an rx for Lisinopril called in. Pls call pt to let her know that dr will call it in.     [facility]- [phone]

## 2012-08-03 NOTE — Unmapped (Signed)
Refill done - cannot take if pregnant or breast-feeding.  Sched appt for BP check in 4 weeks.

## 2012-08-03 NOTE — Unmapped (Signed)
Pt advised of med refill/precautions/ appt scheduled

## 2012-08-03 NOTE — Unmapped (Signed)
lmtcb on pt answering mach re: medication refill request /precautions
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
## 2012-08-04 NOTE — Unmapped (Signed)
I recommend ultrasound of the wrist. I believe it is left wrist, right?

## 2012-08-04 NOTE — Unmapped (Signed)
lmcb

## 2012-08-05 NOTE — Unmapped (Signed)
lmcb x2

## 2012-08-06 NOTE — Unmapped (Signed)
Pt advised, ordeer faxed to [facility]

## 2012-08-06 NOTE — Unmapped (Signed)
I have placed the order.

## 2012-08-06 NOTE — Unmapped (Signed)
[phone] cell, ok to leave message. Left hand , ok with doing ultrasound on hand, wants to go to [facility]

## 2012-08-09 NOTE — Unmapped (Signed)
I talked to patient. She complains of numbness in left hand/wrist and pain when grabbing objects. All this started after an unsuccessful attempt was made to place in IV in left wrist in ER few weeks ago. She had tried high dose NSAID for 2 weeks without relief. I will order CT scan of left upper extremity without contrast. Please get PA. I also informed Alona Bene at [facility].

## 2012-08-09 NOTE — Unmapped (Signed)
MRI- [phone]    Order faxed to [facility]

## 2012-08-09 NOTE — Unmapped (Signed)
I spoke with insurance company. To evaluate injury to nerve or tendon, MRI would be preferred over CT. I will order MRI of left wrist. I got approval from insurance company. Confirmation number is [id number], expires 09/23/2012. I informed the patient to schedule the test.

## 2012-08-09 NOTE — Unmapped (Signed)
Alona Bene from [employer] health scheduling for Lafe Garin says the patient called with an order for an Ultra sound of her hand and wrist but her dept is telling her since there is no mass this is not a correct order for her, they said ct scan or MRI is appropriate, she says the patient is wanting to schedule urgently so they would like to know what the new order will be.

## 2012-08-10 NOTE — Unmapped (Signed)
Pt is scheduled to come in tomorrow. Pt needs 2 precerts for procedure, [employer] and [employer].  Procedure is MRI of left wrist, ordering physician is Dr. Allena Katz.     Procedure code: 16109    Pls return call to [facility] ASAP.

## 2012-08-10 NOTE — Unmapped (Signed)
I spoke to [place]. Informed her that we got PA from [facility] yesterday for MRI left wrist. [id number], expires 09/23/2012. Junious Dresser states the patient also has [employer]. Informed her that [facility] does not show up as her insurance with [place]. If there are any problems with insurance, Junious Dresser will call back tomorrow.

## 2012-08-11 NOTE — Unmapped (Signed)
System is down but once everything gets back running representative will process pa can call [phone] to check the status on pa no reference # the  Name that's process pa is dava last name initial m

## 2012-08-11 NOTE — Unmapped (Signed)
Please get precert for MRI of left wrist without contrast to evaluate nerve/tendon rupture, left hand/wrist pain.

## 2012-08-11 NOTE — Unmapped (Signed)
[employer] pre-cert approved but [employer] secondary requires pre-cert.

## 2012-08-12 NOTE — Unmapped (Signed)
Called insurance [phone], did a retro pa on mri since test was done yesterday. Approved,  #  [id number]

## 2012-08-12 NOTE — Unmapped (Signed)
MRI of left wrist is normal. There is no tendon, ligament, nerve rupture. If she still has pain, next step would be to see orthopaedics - Dr Joylene John, Dr Kathrene Bongo, Dr Linna Darner [phone].

## 2012-08-12 NOTE — Unmapped (Signed)
Pt advised of below

## 2012-08-18 ENCOUNTER — Ambulatory Visit: Admit: 2012-08-18 | Discharge: 2012-08-18 | Payer: PRIVATE HEALTH INSURANCE | Attending: Hand Surgery

## 2012-08-18 ENCOUNTER — Inpatient Hospital Stay: Admit: 2012-08-18 | Payer: PRIVATE HEALTH INSURANCE | Attending: Hand Surgery

## 2012-08-18 DIAGNOSIS — M79643 Pain in unspecified hand: Secondary | ICD-10-CM | POA: Insufficient documentation

## 2012-08-18 DIAGNOSIS — M79609 Pain in unspecified limb: Secondary | ICD-10-CM

## 2012-08-18 NOTE — Unmapped (Signed)
This office note has been dictated.

## 2012-08-18 NOTE — Unmapped (Signed)
[place]                        [facility]IC[facility]     PATIENT NAME:   Sarah [name], Sarah [name]                    MR:  [id number]  DATE OF BIRTH:  [birth year]                     CSN:  [id number]  DICTATED BY:    Joylene John, M.D.           VISIT DATE:  08/18/2012                                NEW PATIENT OFFICE VISIT     CHIEF COMPLAINT:  Left hand numbness.     HISTORY OF PRESENT ILLNESS:  Sarah [name] is a 27 year old right hand dominant  young woman with a past medical history of PCOS and recurrent cellulitis.  She was admitted a month ago, February 17th, with a bout of cellulitis that  was in her left lower leg that she was needing IV antibiotics for.  She says  that it is a Strep infection that recurs.  She said that there has been an  attempt to put an IV in the dorsum of her hand.  This was painful and  unsuccessful, and since that time she has had numbness in that area as well  as in the fingers.  When she is typing she gets tingling in her fingertips.  She has tried taking ibuprofen; it does not help.  It does not wake her up at  night.  She says when the fingers go numb it is usually the ring finger on  the palmar aspect of it.  She has not tried wearing any splints or braces for  it.  It never has happened before this hospitalization.     PAST MEDICAL HISTORY:  1.  Hypertension.  2.  Asthma.  3.  PCOS.  4.  Bipolar disorder.     MEDICATION(S):  1.  Celexa.  2.  Lisinopril.  3.  Ibuprofen.  4.  Penicillin.  5.  Bicillin shot once a month.     ALLERGIES:  She has no known allergies.     HOSPITALIZATION(S)/OPERATION(S):  She has been hospitalized and had  operations in the past, cellulitis multiple times as well as gallbladder in  2003.     SOCIAL HISTORY:  Nonsmoker.  She drinks socially.  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  She works at the [employer].     REVIEW OF SYSTEMS:  Positive only for those things in her history of present  illness.  There are no new symptoms which she has.     FAMILY HISTORY:  Positive for diabetes.     PHYSICAL EXAMINATION:  She is five feet seven inches, 343 pounds.  She is  alert, oriented, and in no acute distress.  She has a negative Spurling's.  She has full symmetric range of motion of shoulder, elbow, wrist, and hand,  although it is painful for her to do wrist flexion.  She  does have thinning  over the dorsum of the wrist in the area of the 3-4 interval.  It almost  appears as though she has about a 2-cm area loss of fat underneath the skin.  This is the area where it is numb.  Otherwise, her skin sensation is intact  distal to this.  She says it is slightly decreased just ulnar to this, but at  the ulnar border of her hand and wrist the sensation is completely intact.  She has a negative Tinel's but a positive Durkan's at 30 seconds.  It makes  the ring finger fall asleep.  She has a negative Tinel's at the elbow but a  positive elbow flexion test, again making the small and ring fingers numb and  tingly.  She has 5/5 opposition strength, 5/5 intrinsic strength.  She can  make a good tight full fist and finger extension.  It is painful for her to  extend the index finger in the area of the injection.  There are no palpable  cysts or masses underneath her skin.  There is no Tinel's over the dorsum of  her wrist or over the dorsal sensory branch of the radial nerve.  She has  brisk capillary refill in all of the fingers, and her sensation is intact and  symmetric in both hands and all fingers.     ASSESSMENT:  This is a 27 year old right hand dominant woman with left hand  numbness which began around the time of an IV placement.     PLAN:  We did discuss the possibility of nerve irritation from the IV  placement or injury.  It does not appear she has a neuroma.  There is no  Tinel's at this time.  She does have some  numbness which may possibly resolve  in time.  We discussed that this would be a branch of one of the main sensory  nerves on the dorsum of the hand; however, I do not think it is related to  her hand falling asleep completely.  She does have provocative symptoms of  the carpal and cubital tunnel, and the numbness is in the ring finger most  commonly which is both of these nerves.  We discussed that it would be more  likely to be carpal tunnel than cubital tunnel however, and we will begin by  treating her for this.  I have given her a night splint, additionally a  prescription for hand therapy to do nerve glides as well as range of motion,  desensitization from the pain with her finger extension.  She is able to  fully extend all the fingers with good strength including extending the index  finger independently, and I do not believe any extensor tendons were injured.  I will see her back in six weeks' time to see how she is doing.                                            Joylene John, M.D.  MS/jlq  D:  08/19/2012 07:45  T:  08/22/2012 12:18  Job #:  [id number]           NEW PATIENT OFFICE VISIT  PAGE    1 of   1

## 2012-08-20 NOTE — Unmapped (Signed)
FYI-Pt would like to make the office aware that her PT prescription had the d.o.b of 02/26/97. She is concerned for insurance purposes.

## 2012-09-11 NOTE — Unmapped (Signed)
Heavy bleeding since Monday - she is convinced she is not pregnant - no relations in 11 months - recent onset of OCP again - in the past very heavy cycles - PCOS - she mentioned a cycle lasting 40 days in the past that couldn't be stopped. Not clear exactly what could be done over the phone, especially without negative pregnancy test (which she was adamant about not needing) - ultimately, she abruptly hung up, calling me a jackass!

## 2012-09-14 ENCOUNTER — Ambulatory Visit: Admit: 2012-09-14 | Payer: PRIVATE HEALTH INSURANCE

## 2012-09-14 ENCOUNTER — Inpatient Hospital Stay: Admit: 2012-09-14 | Payer: PRIVATE HEALTH INSURANCE

## 2012-09-14 DIAGNOSIS — N939 Abnormal uterine and vaginal bleeding, unspecified: Secondary | ICD-10-CM | POA: Insufficient documentation

## 2012-09-14 DIAGNOSIS — I10 Essential (primary) hypertension: Secondary | ICD-10-CM

## 2012-09-14 LAB — DIFFERENTIAL
Basophils Absolute: 46 /uL (ref 0–200)
Basophils Relative: 0.5 % (ref 0.0–1.0)
Eosinophils Absolute: 202 /uL (ref 15–500)
Eosinophils Relative: 2.2 % (ref 0.0–8.0)
Lymphocytes Absolute: 2438 /uL (ref 850–3900)
Lymphocytes Relative: 26.5 % (ref 15.0–45.0)
Monocytes Absolute: 488 /uL (ref 200–950)
Monocytes Relative: 5.3 % (ref 0.0–12.0)
Neutrophils Absolute: 6026 /uL (ref 1500–7800)
Neutrophils Relative: 65.5 % (ref 40.0–80.0)

## 2012-09-14 LAB — CBC
Hematocrit: 36.8 % (ref 35.0–45.0)
Hemoglobin: 12.5 g/dL (ref 11.7–15.5)
MCH: 27.6 pg (ref 27.0–33.0)
MCHC: 34 g/dL (ref 32.0–36.0)
MCV: 81.3 fL (ref 80.0–100.0)
MPV: 9.3 fL (ref 7.5–11.5)
Platelets: 346 10*3/uL (ref 140–400)
RBC: 4.53 10*6/uL (ref 3.80–5.10)
RDW: 16.4 % (ref 11.0–15.0)
WBC: 9.2 10*3/uL (ref 3.8–10.8)

## 2012-09-14 LAB — BASIC METABOLIC PANEL
Anion Gap: 11 mmol/L (ref 3–16)
BUN: 11 mg/dL (ref 7–25)
CO2: 25 mmol/L (ref 21–33)
Calcium: 9.6 mg/dL (ref 8.6–10.2)
Chloride: 106 mmol/L (ref 98–110)
Creatinine: 0.78 mg/dL (ref 0.50–1.20)
GFR MDRD Af Amer: 109 See note.
GFR MDRD Non Af Amer: 90 See note.
Glucose: 98 mg/dL (ref 65–99)
Osmolality, Calculated: 293 mOsm/kg (ref 278–305)
Potassium: 4.1 mmol/L (ref 3.5–5.3)
Sodium: 142 mmol/L (ref 135–146)

## 2012-09-14 LAB — THYROID FUNCTION CASCADE: TSH: 1.77 m[IU]/L (ref 0.45–4.50)

## 2012-09-14 NOTE — Unmapped (Signed)
Controlled on current medication regimen.  Check labs today.  Continue to monitor BP at home and call if elevated.

## 2012-09-14 NOTE — Unmapped (Signed)
Subjective  HPI:   Patient ID: Sarah [name] is a 27 y.o. female.    Chief Complaint:  HPI         HTN - continues lisinopril.  Home BP running 115-120's/70-80's.  No med issues or concerns.    Depression - ongoing issue - she reports overall good control, but recently she notes increased depression and even suicidal thoughts that she associates to hormonal changes after recently starting a new OCP per Endocrine.  She is under the care of a Tour manager - spoke with them this week.  She is no longer suicidal and has a plan of action with her mental health providers.    Vaginal bleeding - she has a h/o PCOS and irregular vaginal bleeding that has been hard to control - ongoing Endocrine and GYN f/u.  She was recently started on a new OCP and then received a penicillin injection per ID.  Subsequently, she developed heavy vaginal bleeding and cramping - onset 7 days ago.  She spoke to the on call doctor for our group, who was concerned about the possibility of pregnancy in the setting of her bleeding - she reports no sexual activity for 11 months.  She has not spoken to Endocrine about the bleeding and reports that she is looking for a new GYN.  She has tried ibuprofen and naproxen with minimal relief of cramping.      Medications:  Current Outpatient Prescriptions   Medication Sig Dispense Refill   ??? citalopram (CELEXA) 20 MG tablet Take 20 mg by mouth daily.       ??? diphenhydrAMINE (BENADRYL) 25 mg tablet Take 25 mg by mouth at bedtime as needed.       ??? ibuprofen (ADVIL,MOTRIN) 800 MG tablet Take 1 tablet (800 mg total) by mouth every 8 hours as needed.  60 tablet  0   ??? lisinopril (PRINIVIL,ZESTRIL) 10 MG tablet Take 1 tablet (10 mg total) by mouth daily.  30 tablet  1   ??? norethindrone-ethinyl estradiol (OVCON) 0.4-35 mg-mcg per tablet Take 1 tablet by mouth daily.       ??? penicillin G benzathine (BICILLIN L-A) 2,400,000 unit/4 mL Syrg Inject 2.4 Million Units into the muscle once.         No current  facility-administered medications for this visit.            ROS:   Review of Systems    As per HPI       Objective:   Physical Exam   Constitutional: No distress.   Eyes: Conjunctivae are normal. Pupils are equal, round, and reactive to light. No scleral icterus.   Neck: No thyromegaly present.   Cardiovascular: Normal rate and regular rhythm.    Pulmonary/Chest: Effort normal and breath sounds normal. No respiratory distress. She has no wheezes.   Musculoskeletal: She exhibits no edema.   Lymphadenopathy:     She has no cervical adenopathy.   Neurological: She is alert.   Psychiatric: She has a normal mood and affect. Her behavior is normal.             Filed Vitals:    09/14/12 1612   BP: 166/82   Temp: 98.6 ??F (37 ??C)   TempSrc: Oral   Height: 5' 7 (1.702 m)   Weight: 357 lb (161.934 kg)     Body mass index is 55.9 kg/(m^2).  Body surface area is 2.77 meters squared.  Assessment/Plan:     Patient Active Problem List   Diagnosis   ??? HTN, goal below 140/90   ??? Recurrent cellulitis of lower leg   ??? PCOS (polycystic ovarian syndrome)   ??? Fatigue   ??? Back pain   ??? Hand pain

## 2012-09-14 NOTE — Unmapped (Signed)
Active issue - discussed at length with pt.  She is on Celexa and reports that she is feeling much better - no SI/HI and she is fully engaged with Psychiatry/Psychology.  Call with problems.

## 2012-09-14 NOTE — Unmapped (Signed)
Recurring issue with complicating factors of recent antibiotics and new OCP.  Disc at length - plan is GYN consult/CBC/TSH.  Additional pending results.  Call with problems.

## 2012-09-16 ENCOUNTER — Ambulatory Visit: Admit: 2012-09-16 | Discharge: 2012-09-16 | Payer: PRIVATE HEALTH INSURANCE

## 2012-09-16 DIAGNOSIS — N939 Abnormal uterine and vaginal bleeding, unspecified: Secondary | ICD-10-CM

## 2012-09-16 LAB — CHLAMYDIA/GONORRHOEAE DNA THIN PREP
Chlamydia Trachomatis DNA: NEGATIVE
Neisseria Gonorrhoeae DNA: NEGATIVE

## 2012-09-16 MED ORDER — ciprofloxacin (CIPRO) 500 MG tablet
500 | ORAL_TABLET | Freq: Two times a day (BID) | ORAL | Status: AC
Start: 2012-09-16 — End: 2012-12-06

## 2012-09-16 NOTE — Unmapped (Signed)
Subjective:       Patient ID: Sarah [name] is a 27 y.o. female.    HPI Comments: Presents for heavy vaginal bleeding, long hx of PCOS and irregular bleeding, has been on OCP in past to control menses, had episode of vaginal bleeding last spring that lasted 40 days, was on several different hormones back then with no control, stopped all hormones last summer, and had no bleeding until March when had 7 days of light bleeding, started OCP to help regulate and then had heavy bleeding starting about 2 weeks into OCP, recently with passage of blood clots that are quarter size and associated with severe cramping, had labs done recently that showed normal CBC, on lithium and celexa in past to help with bipolar disorder, weight loss is past but then came back with psych meds,       The following portions of the patient's history were reviewed and updated as appropriate: allergies, current medications, past family history, past medical history, past social history, past surgical history and problem list.        Review of Systems   Respiratory: Negative for cough, chest tightness, shortness of breath and wheezing.    Cardiovascular: Positive for leg swelling. Negative for chest pain and palpitations.   Gastrointestinal: Positive for nausea, abdominal pain and diarrhea. Negative for vomiting and constipation.   Genitourinary: Positive for dysuria, frequency, vaginal bleeding, vaginal discharge, vaginal pain and menstrual problem. Negative for urgency, difficulty urinating and pelvic pain.   Neurological: Positive for dizziness, light-headedness, numbness and headaches. Negative for seizures and syncope.       Objective:    Physical Exam   Constitutional: She appears well-developed. No distress.   Morbid obesity     HENT:   Head: Normocephalic.   Eyes: Conjunctivae are normal.   Abdominal: Soft. She exhibits no distension. There is no tenderness. There is no rebound and no guarding.   Genitourinary: Uterus normal. There is no  rash, tenderness or lesion on the right labia. There is no rash, tenderness or lesion on the left labia. Right adnexum displays no mass, no tenderness and no fullness. Left adnexum displays no mass, no tenderness and no fullness. There is bleeding (small amount) around the vagina.   Exam limited by obesity, able to see anterior cervix, unable to palpate any pelvic anatomy   Skin: Skin is warm and dry.   Psychiatric: She has a normal mood and affect. Her behavior is normal.               Assessment:     1. Vaginal bleeding  US Transvaginal    US Transvaginal    US Pelvis non-OB limited    AMB REFERRAL TO INFERTILITY    Cytology - PAP Test, Diagnostic    US Pelvis non-OB limited    US Transvaginal    Cytology - PAP Test, Diagnostic         Plan:     Lengthy discussion on PCOS and need to be on some kind of hormones to control her amenorrhea or will ctn to get in the state of prolonged heavy bleeding, will refer to [facility] for better management of her PCOS    Pelvic u/s to asses endometrium and ovaries

## 2012-09-22 NOTE — Unmapped (Signed)
Pt is calling to get her results.

## 2012-09-24 ENCOUNTER — Inpatient Hospital Stay: Admit: 2012-09-24 | Payer: PRIVATE HEALTH INSURANCE

## 2012-09-24 ENCOUNTER — Ambulatory Visit: Admit: 2012-09-24 | Discharge: 2012-09-24 | Payer: PRIVATE HEALTH INSURANCE | Attending: Hand Surgery

## 2012-09-24 DIAGNOSIS — G5600 Carpal tunnel syndrome, unspecified upper limb: Secondary | ICD-10-CM

## 2012-09-24 DIAGNOSIS — E282 Polycystic ovarian syndrome: Secondary | ICD-10-CM

## 2012-09-24 NOTE — Unmapped (Signed)
This office note has been dictated.

## 2012-09-27 NOTE — Unmapped (Signed)
[facility] AND SPORTS MEDICINE     PATIENT NAME:  Sarah [name], Sarah [name]                   MRN:  [id number]  DATE OF BIRTH:  1985-11-06                   CSN:  [id number]  DICTATED BY:  Joylene John, M.D.           VISIT DATE:  09/24/2012                                       OFFICE NOTE     SUBJECTIVE: It is one month after her first visit to the clinic. She was  diagnosed with left hand numbness and felt to have possibly carpal tunnel and  cubital tunnel syndrome related to her exam findings. She was provided with a  splint as well as prescription for physical therapy. She feels that therapy  has made it worse. She thinks that iontophoresis did not help; although, she  has had return of some of the sensation on the dorsum of her hand; although,  it is not completely normal.  She has difficulties when holding the steering  wheel, brushing her hair and using a hair dryer. She feels that the pain is  more in the middle of her wrist and out into her hand.     On physical exam, she does still have tenderness to palpation 3-4 interval  dorsally on the wrist. There is not any obvious palpable cyst.  She has full  flexion and extension of the fingers.  There is a wasting of the fat dorsally  over the area of the 3-4 interval. She has a positive Tinel's at the wrist.  She has a positive Durkan's of the wrist. She has negative Tinel's at her  elbow and a positive elbow flexion test to the ring and small fingers.  She  has 5/5 opposition strength, 5/5 intrinsic strength.  She makes a good,  tight, full fist.  Extends her fingers and her thumb.  She has brisk  capillary refill and her sensation is intact distally in all the fingertips.     Again an MRI was obtained prior to her last visit without any signs of  tenosynovitis or dorsal ganglions.  The interosseous ligaments our intact and  there was no bony edema.     ASSESSMENT: This is a 27 year old right hand  dominant woman with left hand  numbness.     PLAN: I have recommended that she undergo an EMG to evaluate for carpal  tunnel, cubital tunnel and cervical radiculopathy.  She shoulder continue to  wear the splints and the elbow pad if this is helpful for her. I will see her  back after the EMG is performed.  We discussed the possibility that if this  is all negative of doing a carpal tunnel injection to see if this relieves  her symptoms and then subsequently if need be repeat the MRI again to  evaluate her wrist.  She is on board with this plan. She was additionally  given a Tubigrip elbow sleeve.  I will see her after the EMG.  Joylene John, M.D.  MS/tlm  D:  09/27/2012 08:20  T:  09/30/2012 13:21  Job #:  [id number]           OFFICE NOTE                                                  PAGE    1 of   1

## 2012-09-28 NOTE — Unmapped (Signed)
Patient calling for u/s results from 09/24/12 that were done at the [facility] location.

## 2012-09-29 NOTE — Unmapped (Addendum)
Returned call, no answer, LMOM to return call if needed but that everything was normal  U/s was normal

## 2012-10-01 ENCOUNTER — Encounter: Payer: PRIVATE HEALTH INSURANCE | Attending: Hand Surgery

## 2012-10-12 ENCOUNTER — Ambulatory Visit: Admit: 2012-10-12 | Discharge: 2012-10-12 | Payer: PRIVATE HEALTH INSURANCE | Attending: Neurology

## 2012-10-12 DIAGNOSIS — G5600 Carpal tunnel syndrome, unspecified upper limb: Secondary | ICD-10-CM

## 2012-10-12 NOTE — Unmapped (Signed)
(  No note.)

## 2012-10-13 NOTE — Unmapped (Signed)
Patient name:  Sarah [name], Sarah [name]  Patient DOB:  12-28-1985  Date of Study:  10/12/12  Referring Physician:  Dr. Joylene John    Indication for Referral:  The patient was referred for left hand numbness. Question of left carpal tunnel syndrome and cervical radiculopathy.    Summary:  The nerve conduction studies of the left arm are normal. Concentric needle examination of the left arm, including the left cervical paraspinal muscles, was unremarkable.    Interpretation:   This is a normal study. There is no electrodiagnostic evidence of left median neuropathy at the wrist (carpal tunnel syndrome) or left cervical radiculopathy.      Caleb Popp, M.D.  HK/md  T:  10/13/12    Proofed electronically by Dr. Julieta Gutting      Nerve Conduction Studies     Motor           Amplitude   Latency    Vel. Dist. F-Wave    Temp. Comments                        (mV)       (msec)    (m/sec)  (cm)  (msec)     (C??)          L median nerve/abductor pollicis brevis   Wrist   6.6  3.0  55  25.7     Elbow   6.2  7.0   22.4  L ulnar nerve/abductor digiti minimi   Wrist  10.7  2.3    25.3   Below elbow 10.5  5.8  62 22.7   Above elbow  10.2  7.4  64 33.0    Sensory          Amplitude   Latency     Velocity    Dist. Temp. Comments                     (uV)        (msec)      (m/sec)    (cm)      L ulnar  W  33.8  2.5  75      E  10.8  5.8    30.6  L med palmar    86.5  1.9     8.0  x uln palmar    32.9  1.9     8.0      Needle Electrode Studies    Muscle IA Fib Fasc  MUPs Recruitment Dur Amp Turns Comments   L 1st dors int nl 0 0 nl nl       L pron teres nl 0 0 nl nl       L ext dig comm nl 0 0 nl nl       L triceps nl 0 0 nl nl       L deltoid nl 0 0 nl nl       L cerv psp nl 0 0 -- --  Nerve Conduction Studies (normal values for skin temp. at 32 C?? arm/30 C?? leg)     Motor            Amplitude   Latency     Velocity    F-wave                        (mV)        (msec)      (m/sec)     (msec)  Median           >3.9        <4.5        >48         <32         Ulnar            >6.5        <3.4        >53         <33          Fibular (peron)  >1.9        <7.1        >40         <58  Tibial           >3.9        <5.8        >39         <57    Sensory        Amplitude   Latency     Velocity                   (uV)       (msec)      (m/sec)  Median palmar    >49.0       <2.3  Ulnar palmar     >15.0       <2.3  Median           >24.9       <3.6        >56  Ulnar             >9.6       <3.0        >54  Radial           >20.0       <2.9  Lat cutaneous    >11.9       <2.7  Med plantar       >5.9       <4.1  Sural             >5.9       <4.5  Sup fib (peron)   >0         <4.1

## 2012-10-26 ENCOUNTER — Inpatient Hospital Stay: Admit: 2012-10-26 | Payer: PRIVATE HEALTH INSURANCE | Attending: Hand Surgery

## 2012-10-26 ENCOUNTER — Ambulatory Visit: Admit: 2012-10-26 | Discharge: 2012-10-26 | Payer: PRIVATE HEALTH INSURANCE | Attending: Hand Surgery

## 2012-10-26 DIAGNOSIS — M25532 Pain in left wrist: Secondary | ICD-10-CM

## 2012-10-26 DIAGNOSIS — M25539 Pain in unspecified wrist: Secondary | ICD-10-CM

## 2012-10-26 MED ORDER — triamcinolone acetonide (KENALOG-40) injection 40 mg
40 | Freq: Once | INTRAMUSCULAR | Status: AC
Start: 2012-10-26 — End: 2012-10-27
  Administered 2012-10-27: 21:00:00 via INTRA_ARTICULAR

## 2012-10-26 MED ORDER — lidocaine 10 mg/mL (1 %) injection 2 mL
10 | Freq: Once | INTRAMUSCULAR | Status: AC
Start: 2012-10-26 — End: 2012-10-27
  Administered 2012-10-27: 21:00:00 via SUBCUTANEOUS

## 2012-10-26 NOTE — Unmapped (Signed)
This office note has been dictated.

## 2012-10-26 NOTE — Unmapped (Signed)
Superficial skin marker seen along the dorsum of the wrist with no underlying focal abnormality.  No internal derangement is identified within the wrist     Result Narrative   MRI LEFT WRIST:  CLINICAL INDICATION: Pain and numbness on back of left hand, IV was previously placed in this region.  TECHNIQUE: Multiplanar, multisequence MR imaging of the left wrist was performed Without contrast:  COMPARISON: None  FINDINGS:  The bones are negative for fracture or other acute osseous process.  The proximal mid and distal carpal arcs are maintained.  The scapholunate ligament appears normal.  The scapholunate interval is unremarkable.  The lunotriquetral ligament is also   normal.  The lunate bone shows no angular changes to suggest either dorsal or volar intercalated segmental instability.  The foveal attachments of the triangular fibrocartilage and the remainder of the triangular fibrocartilage appears normal.  The distal radioulnar joint is normally aligned.  The extensor tendons about the wrist are normal.  No focal fluid collection is seen to suggest tenosynovitis.  A superficial skin marker is noted in the region of the subjective abnormality and no subjacent lesion is identified.  The tendons within the carpal tunnel are normal.  The median nerve is normal in size and signal intensity with a normal fascicular pattern.  The ulnar nerve is unremarkable as well.

## 2012-10-29 NOTE — Unmapped (Signed)
[facility]  AND SPORTS MEDICINE     PATIENT NAME:  Sarah [name], Sarah [name]                   MRN:  [id number]  DATE OF BIRTH:  [birth year]                   CSN:  [id number]  DICTATED BY:   Joylene John, M.D.          VISIT DATE:  10/26/2012                                       OFFICE NOTE     SUBJECTIVE: Ms. Sachse continues to have pain, numbness and tingling in the  left wrist and her fingers. She said that it is not related to any specific  activity but it is worse with activities. She has a hard time pinpointing if  the pain is worse over the dorsum of the wrist, the volar aspect of the wrist  or the numbness and tingling. She does feel that the wrist pain is worse than  the numbness and tingling and this all began after IV placement in the  hospital. She never had any sort of injury prior to that.  She most recently  underwent an EMG which was  negative. She has attempted wearing a splint  without any relief. She has attempted therapy and she felt that that made  things worse.  She feels like typing also exacerbates this.     PHYSICAL EXAMINATION:  She has pain diffusely throughout the wrist,  specifically along the flexor carpi radialis tendon as well as over the 3-4  interval dorsally. There is no obvious ganglion cyst palpated here, although  it is tender in the soft tissues. She has symmetric range of motion to the  contralateral side.  She has pain with both extension and flexion. She does  not have pain related to a Watson's test. She does not have ulnar-sided wrist  pain along the ECU, DRUJ or ulnar snuffbox. She does not have any snuffbox  tenderness. She has pain with Tinel's at the wrist and Durkan's causes  increased numbness and tingling. Her grip strength on the left is 20, 20 and  20, on the right it is 60, 40 and 40. Pinch on the left is 9, 7 and 5, pinch  on the right is 12, 11 and 11. Her two-point discrimination is 5 mm in all  of  her fingers.     X-rays specifically of her wrist were ordered and independently reviewed  today including a bilateral grip view and scaphoid view, all of which were  negative for any signs of arthritis or interligamentous injury and she is 2  mm ulnar negative variance.  There is no sclerosis in the lunate or in the  scaphoid and no sign of joint space narrowing. The scaphoid does appear to be  flexed mildly. There is no widening of the scapholunate ligament with a grip  view and her scapholunate angle is 54 degrees.     Her EMG is negative.     Her outside MRI, which was  obtained closer to the onset of her symptoms of  her wrist, is negative for any cartilage injury. The TFCC is intact.  She has  no interosseous ligament injuries.     ASSESSMENT: This is a 27 year old right-hand dominant woman with left wrist  pain, numbness and tingling.     PLAN: I have discussed with her doing serial injections to try to decipher  what the pain generator is. We will start with the wrist joint and she was  amenable to this. The injection has been performed today. I will see her back  in 26 months' time.     PROCEDURE NOTE:  After discussion of risks and benefits the patient was  prepped dorsally over the 3-4 interval of the wrist. This was  needle  localized and injected with 2 mL of 1% plain lidocaine and 1 mL of 40 mg/mL  of Kenalog.  The patient tolerated the injection.  Pressure was  held and a  Band-Aid was  applied.  I will see her back in 1 months' time.  Of note, she  noted fullness in her wrist.  She could not say that this gave her complete  relief of her symptoms.                                                Joylene John, M.D.  MS/tmg  D:  10/29/2012 08:04  T:  11/02/2012 08:30  Job #:  [id number]                                                      OFFICE NOTE                                                               PAGE    1 of   1

## 2012-11-19 ENCOUNTER — Encounter: Payer: PRIVATE HEALTH INSURANCE | Attending: Hand Surgery

## 2012-11-19 ENCOUNTER — Ambulatory Visit: Admit: 2012-11-19 | Discharge: 2012-11-19 | Payer: PRIVATE HEALTH INSURANCE | Attending: Hand Surgery

## 2012-11-19 DIAGNOSIS — M25539 Pain in unspecified wrist: Secondary | ICD-10-CM

## 2012-11-19 MED ORDER — lidocaine 10 mg/mL (1 %) injection 2 mL
10 | Freq: Once | INTRAMUSCULAR | Status: AC
Start: 2012-11-19 — End: 2012-11-19
  Administered 2012-11-19: 17:00:00 via SUBCUTANEOUS

## 2012-11-19 MED ORDER — triamcinolone acetonide (KENALOG-40) injection 40 mg
40 | Freq: Once | INTRAMUSCULAR | Status: AC
Start: 2012-11-19 — End: 2012-11-19
  Administered 2012-11-19: 17:00:00 via INTRA_ARTICULAR

## 2012-11-19 NOTE — Unmapped (Signed)
This office note has been dictated.

## 2012-11-19 NOTE — Unmapped (Signed)
MRI WRIST LEFT WO CON3/19/2014   [facility]   Result Impression   Superficial skin marker seen along the dorsum of the wrist with no underlying focal abnormality.  No internal derangement is identified within the wrist     Result Narrative   MRI LEFT WRIST:  CLINICAL INDICATION: Pain and numbness on back of left hand, IV was previously placed in this region.  TECHNIQUE: Multiplanar, multisequence MR imaging of the left wrist was performed Without contrast:  COMPARISON: None  FINDINGS:  The bones are negative for fracture or other acute osseous process.  The proximal mid and distal carpal arcs are maintained.  The scapholunate ligament appears normal.  The scapholunate interval is unremarkable.  The lunotriquetral ligament is also   normal.  The lunate bone shows no angular changes to suggest either dorsal or volar intercalated segmental instability.  The foveal attachments of the triangular fibrocartilage and the remainder of the triangular fibrocartilage appears normal.  The distal radioulnar joint is normally aligned.  The extensor tendons about the wrist are normal.  No focal fluid collection is seen to suggest tenosynovitis.  A superficial skin marker is noted in the region of the subjective abnormality and no subjacent lesion is identified.  The tendons within the carpal tunnel are normal.  The median nerve is normal in size and signal intensity with a normal fascicular pattern.  The ulnar nerve is unremarkable as well.

## 2012-11-22 NOTE — Unmapped (Signed)
[facility]  AND SPORTS MEDICINE     PATIENT NAME:  Sarah [name], Sarah [name]                   MRN:  [id number]  DATE OF BIRTH:  07-24-1985                   CSN:  [id number]  DICTATED BY:   Joylene John, M.D.          VISIT DATE:  11/19/2012                                       OFFICE NOTE     SUBJECTIVE:  [name] returns now 1 month out from her previous visit where  she underwent steroid injection into the wrist. She said she did get relief  of her symptoms in the wrist but it is starting to wear off. She is also  having volar-sided wrist pain which she said did not get better and she  thinks that she felt the volar-sided pain more.     On physical exam she has mild tenderness to palpation dorsally about the  wrist. She is tender volarly over the flexor tendons in the area just  proximal to the wrist crease. She has positive Tinel's. She has positive  Durkan's going out into her fingers. She has exquisite tenderness to  palpation directly over the pronator and pain with resisted pronation.  She  said that compression on the pronator causes numbness in all of the fingers  almost immediately.  She does have positive Tinel's at her elbow bilaterally  and a positive elbow flexion test bilaterally.  Her sensation is subjectively  decreased in the median nerve distribution on the right side.     ASSESSMENT: This is a 27 year old right-hand dominant woman with left wrist  pain.     PLAN:  Given that she has a negative MRI but relief of some of her symptoms  with a wrist injection she may benefit from having wrist arthroscopy to  evaluate intraarticularly if she has possibly an occult ganglion which was  not picked up on MRI or some synovitis.  She does have nerve type symptoms  with numbness and tingling in the fingers and volar-sided wrist pain. Her EMG  is negative for carpal tunnel syndrome. I did recommend, given her exam  findings consistent with  carpal tunnel syndrome, that she undergo a steroid  injection into the carpal canal.  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  This was  discussed  with her and she was  amenable to carpal tunnel injection today and that was  performed in the clinic.  She did mention that at her 27th birthday that she  will no longer be eligible to use her parents insurance. She is working on  getting employment where she has insurance and notes that her medical  problems require it, but does have some concern for sorting this out prior to  that time. I have injected her carpal tunnel today. I will see her back in 6  weeks' time.     After discussion of the risks and  benefits the patient was  prepped volarly 1  cm proximal to the distal wrist crease halfway between the FCU and palmaris  on the left side.  This was  needle localized. There were no paresthesias.  She was  injected with 2 mL of 1% plain lidocaine and 1 mL of 40 mg/mL of  Kenalog. She tolerated the injection, pressure was  held and a Band-Aid was  applied.                                                Joylene John, M.D.  MS/tmg  D:  11/22/2012 07:35  T:  11/24/2012 08:12  Job #:  [id number]                                                      OFFICE NOTE                                                               PAGE    1 of   1

## 2012-12-06 ENCOUNTER — Ambulatory Visit: Admit: 2012-12-06 | Payer: PRIVATE HEALTH INSURANCE

## 2012-12-06 DIAGNOSIS — L03119 Cellulitis of unspecified part of limb: Secondary | ICD-10-CM

## 2012-12-06 MED ORDER — cephALEXin (KEFLEX) 500 MG capsule
500 | ORAL_CAPSULE | Freq: Three times a day (TID) | ORAL | Status: AC
Start: 2012-12-06 — End: 2012-12-16

## 2012-12-06 NOTE — Unmapped (Signed)
Subjective  HPI:   Patient ID: Rhiann Boucher is a 27 y.o. female.    Chief Complaint:  HPI         Acute visit - lump on the left leg.  She got a bug bite - lateral lower leg approx 2 weeks ago - increased size/tenderness/swelling at the site.  No fever/drainage.      Medications:  Current Outpatient Prescriptions   Medication Sig Dispense Refill   ??? citalopram (CELEXA) 20 MG tablet Take 20 mg by mouth daily.       ??? ibuprofen (ADVIL,MOTRIN) 800 MG tablet Take 1 tablet (800 mg total) by mouth every 8 hours as needed.  60 tablet  0   ??? lisinopril (PRINIVIL,ZESTRIL) 10 MG tablet Take 1 tablet (10 mg total) by mouth daily.  30 tablet  1   ??? penicillin G benzathine (BICILLIN L-A) 2,400,000 unit/4 mL Syrg Inject 2.4 Million Units into the muscle once.       ??? TOPIRAMATE (TOPAMAX ORAL) Take by mouth.         No current facility-administered medications for this visit.            ROS:   Review of Systems    As per HPI       Objective:   Physical Exam   Constitutional: No distress.   Skin: There is erythema.   1 cm area of induration - lateral left lower leg.  + surrounding tenderness.  No fluctuance.             Filed Vitals:    12/06/12 1308   Height: 5' 6.93 (1.7 m)   Weight: 352 lb (159.666 kg)     Body mass index is 55.25 kg/(m^2).  Body surface area is 2.75 meters squared.                Assessment/Plan:     Patient Active Problem List   Diagnosis   ??? HTN, goal below 140/90   ??? Recurrent cellulitis of lower leg   ??? PCOS (polycystic ovarian syndrome)   ??? Fatigue   ??? Back pain   ??? Hand pain   ??? Depression   ??? Vaginal bleeding       Cellulitis - left leg - complicating an insect bite.  Plan is cephalexin 500 mg po tid for 10 days + elevation/heat.  Additional pending results.  Call with problems.

## 2012-12-13 ENCOUNTER — Ambulatory Visit: Admit: 2012-12-13 | Payer: PRIVATE HEALTH INSURANCE

## 2012-12-13 DIAGNOSIS — B029 Zoster without complications: Secondary | ICD-10-CM

## 2012-12-13 MED ORDER — valACYclovir (VALTREX) 1000 MG tablet
1000 | ORAL_TABLET | Freq: Three times a day (TID) | ORAL | Status: AC
Start: 2012-12-13 — End: 2012-12-20

## 2012-12-13 NOTE — Unmapped (Signed)
Subjective  HPI:   Patient ID: Sarah [name] is a 27 y.o. female.    Chief Complaint:  HPI         Rash - painful area along the right upper back over the last couple days.  The rash - clusters of red bumps.  The pain preceded the rash by a few days.  She notes some chills/fatigue for a couple of days.      Medications:  Current Outpatient Prescriptions   Medication Sig Dispense Refill   ??? cephALEXin (KEFLEX) 500 MG capsule Take 1 capsule (500 mg total) by mouth 3 times a day.  30 capsule  0   ??? citalopram (CELEXA) 20 MG tablet Take 20 mg by mouth daily.       ??? DULoxetine (CYMBALTA) 30 MG capsule Take 30 mg by mouth daily.       ??? ibuprofen (ADVIL,MOTRIN) 800 MG tablet Take 1 tablet (800 mg total) by mouth every 8 hours as needed.  60 tablet  0   ??? lisinopril (PRINIVIL,ZESTRIL) 10 MG tablet Take 1 tablet (10 mg total) by mouth daily.  30 tablet  1   ??? TOPIRAMATE (TOPAMAX ORAL) Take by mouth.         No current facility-administered medications for this visit.            ROS:   Review of Systems    As per HPI       Objective:   Physical Exam   Constitutional: No distress.   Skin: Rash noted. There is erythema.   Clusters of erythematous papules/vesicles on upper back - right.             Filed Vitals:    12/13/12 1056   BP: 142/94   Temp: 98 ??F (36.7 ??C)   Height: 5' 6.93 (1.7 m)   Weight: 353 lb (160.12 kg)     Body mass index is 55.4 kg/(m^2).  Body surface area is 2.75 meters squared.                Assessment/Plan:     Patient Active Problem List   Diagnosis   ??? HTN, goal below 140/90   ??? Recurrent cellulitis of lower leg   ??? PCOS (polycystic ovarian syndrome)   ??? Fatigue   ??? Back pain   ??? Hand pain   ??? Depression   ??? Vaginal bleeding       Herpes Zoster - acute.  Plan is Valtrex and precautions.  Call with problems.

## 2012-12-15 ENCOUNTER — Ambulatory Visit: Admit: 2012-12-15 | Discharge: 2012-12-15 | Payer: PRIVATE HEALTH INSURANCE | Attending: Hand Surgery

## 2012-12-15 DIAGNOSIS — G5600 Carpal tunnel syndrome, unspecified upper limb: Secondary | ICD-10-CM

## 2012-12-15 NOTE — Unmapped (Signed)
See transcribed note for additional clinical documentation for this visit.

## 2012-12-16 DIAGNOSIS — G5602 Carpal tunnel syndrome, left upper limb: Secondary | ICD-10-CM | POA: Insufficient documentation

## 2012-12-16 NOTE — Unmapped (Signed)
[facility]  AND SPORTS MEDICINE     PATIENT NAME:  Sarah [name], Sarah [name]                   MRN:  [id number]  DATE OF BIRTH:  31-Jul-1985                   CSN:  [id number]  DICTATED BY:   Joylene John, M.D.          VISIT DATE:  12/15/2012                                       OFFICE NOTE     SUBJECTIVE: Ms. Labuda on her last visit underwent a steroid injection for  carpal tunnel syndrome. She said she got complete relief from this.  However,  in the last couple of days the symptoms have started to return.  She said the  dorsal hand numbness is improving.     On physical exam she has positive Tinel's at the wrist, positive Durkan's at  the wrist.  All of this goes to her index and middle finger.  There is no  wasting of the thenar eminence. She has full finger range of motion, flexion  and extension, fires her intrinsics and brisk capillary refill.     ASSESSMENT AND PLAN:  This is a 27 year old right-hand dominant woman with  left carpal tunnel syndrome for which the EMG was  negative, however a  steroid injection in the carpal canal has helped resolve her symptoms.  She  would like to consider undergoing surgery.  A surgical discussion was  had  with her with the details of the day as well as the perspective outcomes.  She will discuss this with her parents. The surgery scheduling sheet has been  filled out.  We will contact her to set up surgery in the next couple of  days. She does understand that this will require a preoperative evaluation by  her primary care doctor.                                                Joylene John, M.D.  MS/tmg  D:  12/16/2012 07:48  T:  12/20/2012 13:27  Job #:  [id number]                                                      OFFICE NOTE                                                               PAGE    1 of   1

## 2012-12-24 ENCOUNTER — Ambulatory Visit: Admit: 2012-12-24 | Payer: PRIVATE HEALTH INSURANCE

## 2012-12-24 DIAGNOSIS — Z01818 Encounter for other preprocedural examination: Secondary | ICD-10-CM

## 2012-12-24 MED ORDER — lisinopril (PRINIVIL,ZESTRIL) 10 MG tablet
10 | ORAL_TABLET | Freq: Every day | ORAL | Status: AC
Start: 2012-12-24 — End: 2021-09-09

## 2012-12-24 NOTE — Unmapped (Signed)
Subjective  HPI:   Patient ID: Lane Kjos is a 27 y.o. female.    Chief Complaint: Preop  HPI         Surgery - left carpal tunnel release  Reason for surgery - left carpal tunnel  Date of surgery - 01/06/2013  Referred by - Dr Joylene John     She has a history of PCOS and HTN.     She has no history of CAD, DM, CKD, CHF, TIA/CVA and exerts to greater than 4 METs without DOE or chest pain.    She has no bleeding disorders or previous history of difficulty with anesthesia.       ROS:   Review of Systems   Constitutional: Negative for fever, chills and diaphoresis.   HENT: Negative for congestion, sore throat and sneezing.    Eyes: Negative for visual disturbance.   Respiratory: Negative for cough, chest tightness, shortness of breath and wheezing.    Cardiovascular: Positive for leg swelling (chronic). Negative for chest pain.   Gastrointestinal: Negative for nausea, vomiting and abdominal pain.   Genitourinary: Negative for dysuria and hematuria.   Musculoskeletal: Negative for back pain.   Skin: Negative for rash.   Neurological: Positive for headaches.   Hematological: Does not bruise/bleed easily.   Psychiatric/Behavioral: Negative for suicidal ideas. The patient is nervous/anxious.           Objective:   Physical Exam   Vitals reviewed.  Constitutional: She is oriented to person, place, and time. No distress.   Morbidly obese   HENT:   Head: Normocephalic and atraumatic.   Mouth/Throat: Oropharynx is clear and moist. No oropharyngeal exudate.   Eyes: EOM are normal. Pupils are equal, round, and reactive to light.   Cardiovascular: Normal rate, regular rhythm and normal heart sounds.    Pulmonary/Chest: Effort normal and breath sounds normal. No respiratory distress.   Abdominal: Soft. Bowel sounds are normal. There is no tenderness.   Musculoskeletal: She exhibits edema (chronic lymphedema in BLE).   Lymphadenopathy:     She has no cervical adenopathy.   Neurological: She is alert and oriented to person,  place, and time.   Skin: Skin is warm.   Psychiatric:   Appropriate discussion and affect.              Filed Vitals:    12/24/12 0931   BP: 142/82   Temp: 98.6 ??F (37 ??C)   TempSrc: Oral   Height: 5' 7 (1.702 m)   Weight: 355 lb (161.027 kg)     Body mass index is 55.59 kg/(m^2).  Body surface area is 2.76 meters squared.       Assessment:   27 y.o. female with planned surgery: left carpal tunnel release.    Known risk factors for perioperative complications: morbid obesity.    Difficulty with intubation may be anticipated due to body habitus.    Cardiac Risk Estimation: per the Revised Cardiac Risk Index (Circ. 100:1043, 1999), the patient's risk factors for cardiac complications include none, putting her in: RCI RISK CLASS I (0 risk factors, risk of major cardiac compl. appr. 0.5%).    Current medications which may produce withdrawal symptoms if withheld perioperatively: none     Plan:   1. Preoperative workup as follows none.  2. Change in medication regimen before surgery: discontinue ASA and NSAID 7 days before surgery.  3. This patient has low risk clinical predictors for an intermediate risk surgery. You may proceed without further evaluation  or change in management.

## 2013-01-04 ENCOUNTER — Ambulatory Visit: Payer: PRIVATE HEALTH INSURANCE

## 2013-01-04 NOTE — Unmapped (Addendum)
Pre-Procedure Instructions    We???re pleased that you have chosen [facility] for your upcoming procedure.  The staff serving you is professionally trained to provide the highest quality care.  We encourage you to ask questions and to let the staff know your special needs.  We want your visit to be as comfortable as possible.    Your procedure is scheduled on 01/06/13 at 0730 AM.  Please arrive at 0530 AM and check in at the surgery waiting room on the second floor of the main hospital.    Directions to Surgery Area    1) Main entrance to the elevators on the left.  2) Elevator to the second floor.  3) Left off elevator to the Surgical Services Desk.    DO NOT TAKE THE MORNING OF SURGERY: does not take pills in the am    FOLLOW YOUR PHYSICIAN'S INSTRUCTIONS CONCERNING DISCONTINUING ASPIRIN, IBUPROFEN, NSAIDS, SUPPLEMENTS, FISH OIL, VITAMINS, AND HERBAL SUPPLEMENTS.      DO NOT EAT OR DRINK ANYTHING (including gum, mints, water, etc.) after midnight the night before your procedure.  You may brush your teeth and gargle on the morning of surgery, but do not swallow any water with the exception of the following medication: n/a (with a small sip of water).      Please make transportation arrangements and bring a responsible adult to accompany you home and remain with you for 24 hours.    Leave valuables (money, jewelry, credit cards) at home.  If you wear glasses or contacts, bring a case for safekeeping.    Wear casual, loose fitting, and comfortable clothing.  A gown will be provided.  If you are staying overnight, bring a small overnight bag.  (Storage space is limited.)    Please remove all makeup, jewelry, body piercings, powder, perfume, and nail polish before you arrive.    Bring a list of your medications and dose including herbal.  Do not bring any pills or medications to the hospital. (Exception: transplant patients.)    Bring a photo ID and your insurance card so we can bill your insurance company  directly.    Please do not bring any children under the age of 46 to the hospital.    Do not shave in the area of the surgery for 2 days prior to surgery.  If needed, a trained staff member will clip the area immediately before your surgery.    Quit smoking as far in advance of surgery as possible.  Patients who quit at least 30 days before surgery may have better outcomes.    If you are diabetic, pay close attention to your blood sugar and try to keep it in the range your doctor wants it to be in.      Discuss discontinuing herbal medications with your doctor before surgery.    Talk to your doctor about taking medication such as Aspirin, Plavix, Pradaxa or Coumadin before surgery.    Please shower at home the evening before and the morning of surgery using an antibacterial soap.    If you have a cold or are sick prior to surgery, contact your doctor before surgery.    Antibacterial showering and good hand hygiene are essential to prevent surgical site infections and reduce the spread of MRSA.  Patient verbalized understanding of these instructions.    Make sure all of your health care givers are checking your ID bracelet and verifying your name and date of birth.  You will actively  be involved in verifying the type of surgery you are having and the correct site.  Your health care givers should be cleaning their hands with soap and water or antibacterial foam before taking care of you and if they do not it is ok to remind them to do so.      Patient/Family provided education about surgical site infection prevention.    Contact information:    [facility] Pre-admission Testing, Monday - Friday 8:00 am - 4:30 pm, [phone].

## 2013-01-05 ENCOUNTER — Encounter: Payer: PRIVATE HEALTH INSURANCE | Attending: Hand Surgery

## 2013-01-06 LAB — POCT URINE HCG BY VISUAL COLOR COMPARISON TESTS: Preg Test, POC, Ur: NEGATIVE

## 2013-01-06 MED ORDER — ceFAZolin (ANCEF) IVPB 2 g in D5W (duplex)
2 | Freq: Once | INTRAVENOUS | Status: AC
Start: 2013-01-06 — End: 2013-01-06
  Administered 2013-01-06: 12:00:00 via INTRAVENOUS

## 2013-01-06 MED ORDER — HYDROcodone-acetaminophen (NORCO) 5-325 mg per tablet 1 tablet
5-325 | Freq: Once | ORAL | Status: AC
Start: 2013-01-06 — End: 2013-01-06
  Administered 2013-01-06: 14:00:00 via ORAL

## 2013-01-06 MED ORDER — bupivacaine (PF) (SENSORCAINE/MARCAINE) 0.5 % (5 mg/mL) Soln
0.5 | INTRAMUSCULAR | Status: AC
Start: 2013-01-06 — End: 2013-01-06

## 2013-01-06 MED ORDER — HYDROcodone-acetaminophen (NORCO) 5-325 mg per tablet
5-325 | ORAL_TABLET | Freq: Four times a day (QID) | ORAL | 0.00 refills | 15.50000 days | Status: AC | PRN
Start: 2013-01-06 — End: 2014-04-28

## 2013-01-06 MED ORDER — lactated ringers infusion
INTRAVENOUS | Status: AC | PRN
Start: 2013-01-06 — End: 2013-01-06
  Administered 2013-01-06: 11:00:00 via INTRAVENOUS

## 2013-01-06 MED ORDER — lidocaine (PF) (XYLOCAINE) 10 mg/mL (1 %) Soln
10 | INTRAMUSCULAR | Status: AC
Start: 2013-01-06 — End: 2013-01-06

## 2013-01-06 MED ORDER — propofol (DIPRIVAN) infusion 10 mg/mL ADS Med
10 | INTRAVENOUS | Status: AC
Start: 2013-01-06 — End: ?

## 2013-01-06 MED ORDER — HYDROmorphone (DILAUDID) injection Syrg 0.2 mg
0.5 | INTRAMUSCULAR | Status: AC | PRN
Start: 2013-01-06 — End: 2013-01-06

## 2013-01-06 MED ORDER — fentaNYL (SUBLIMAZE) injection 50 mcg
50 | INTRAMUSCULAR | Status: AC | PRN
Start: 2013-01-06 — End: 2013-01-06

## 2013-01-06 MED ORDER — lactated ringers infusion
INTRAVENOUS | Status: AC
Start: 2013-01-06 — End: 2013-01-06
  Administered 2013-01-06: 11:00:00 via INTRAVENOUS

## 2013-01-06 MED ORDER — HYDROmorphone (DILAUDID) injection Syrg 0.4 mg
0.5 | INTRAMUSCULAR | Status: AC | PRN
Start: 2013-01-06 — End: 2013-01-06

## 2013-01-06 MED ORDER — nalOXone (NARCAN) injection 0.04 mg
0.4 | INTRAMUSCULAR | Status: AC | PRN
Start: 2013-01-06 — End: 2013-01-06

## 2013-01-06 MED ORDER — HYDROmorphone (DILAUDID) injection Syrg 0.6 mg
1 | INTRAMUSCULAR | Status: AC | PRN
Start: 2013-01-06 — End: 2013-01-06

## 2013-01-06 MED ORDER — sodium chloride 0.9 % irrigation
0.9 | Status: AC | PRN
Start: 2013-01-06 — End: 2013-01-06
  Administered 2013-01-06: 12:00:00

## 2013-01-06 MED ORDER — ondansetron (ZOFRAN) 4 mg/2 mL injection
4 | INTRAMUSCULAR | Status: AC | PRN
Start: 2013-01-06 — End: 2013-01-06
  Administered 2013-01-06: 12:00:00 via INTRAVENOUS

## 2013-01-06 MED ORDER — propofol 10 mg/ml (DIPRIVAN) injection
10 | INTRAVENOUS | Status: AC | PRN
Start: 2013-01-06 — End: 2013-01-06
  Administered 2013-01-06 (×2): via INTRAVENOUS

## 2013-01-06 MED ORDER — ondansetron (ZOFRAN) 4 mg/2 mL injection
4 | INTRAMUSCULAR | Status: AC
Start: 2013-01-06 — End: 2013-01-06
  Administered 2013-01-06: 13:00:00 via INTRAVENOUS

## 2013-01-06 MED ORDER — lactated ringers infusion
INTRAVENOUS | Status: AC
Start: 2013-01-06 — End: 2013-01-06

## 2013-01-06 MED ORDER — midazolam (PF) (VERSED) injection
1 | INTRAMUSCULAR | Status: AC | PRN
Start: 2013-01-06 — End: 2013-01-06
  Administered 2013-01-06: 12:00:00 via INTRAVENOUS

## 2013-01-06 MED ORDER — midazolam (PF) (VERSED) 1 mg/mL injection
1 | INTRAMUSCULAR | Status: AC
Start: 2013-01-06 — End: ?

## 2013-01-06 MED ORDER — lidocaine (PF) (XYLOCAINE) 10 mg/mL (1 %) Soln
10 | INTRAMUSCULAR | Status: AC | PRN
Start: 2013-01-06 — End: 2013-01-06
  Administered 2013-01-06: 12:00:00

## 2013-01-06 MED ORDER — fentaNYL (SUBLIMAZE) injection 25 mcg
50 | INTRAMUSCULAR | Status: AC | PRN
Start: 2013-01-06 — End: 2013-01-06
  Administered 2013-01-06: 14:00:00 via INTRAVENOUS

## 2013-01-06 MED ORDER — ondansetron (ZOFRAN) 4 mg/2 mL injection 4 mg
4 | Freq: Three times a day (TID) | INTRAMUSCULAR | Status: AC | PRN
Start: 2013-01-06 — End: 2013-01-06

## 2013-01-06 MED ORDER — bupivacaine (MARCAINE) 0.5 % (5 mg/mL) injection
0.5 | INTRAMUSCULAR | Status: AC | PRN
Start: 2013-01-06 — End: 2013-01-06
  Administered 2013-01-06: 12:00:00 via SUBCUTANEOUS

## 2013-01-06 MED ORDER — propofol (DIPRIVAN) infusion 10 mg/mL
10 | INTRAVENOUS | Status: AC | PRN
Start: 2013-01-06 — End: 2013-01-06
  Administered 2013-01-06: 12:00:00 via INTRAVENOUS

## 2013-01-06 MED ORDER — fentaNYL (SUBLIMAZE) 50 mcg/mL injection
50 | INTRAMUSCULAR | Status: AC
Start: 2013-01-06 — End: ?

## 2013-01-06 MED ORDER — fentaNYL (SUBLIMAZE) injection
50 | INTRAMUSCULAR | Status: AC | PRN
Start: 2013-01-06 — End: 2013-01-06
  Administered 2013-01-06 (×3): via INTRAVENOUS

## 2013-01-06 MED ORDER — fentaNYL (SUBLIMAZE) injection 12.5 mcg
50 | INTRAMUSCULAR | Status: AC | PRN
Start: 2013-01-06 — End: 2013-01-06

## 2013-01-06 MED FILL — LACTATED RINGERS INTRAVENOUS SOLUTION: 50.00 50.00 mL/hr | INTRAVENOUS | Qty: 1000

## 2013-01-06 MED FILL — MIDAZOLAM (PF) 1 MG/ML INJECTION SOLUTION: 1 1 mg/mL | INTRAMUSCULAR | Qty: 2

## 2013-01-06 MED FILL — CEFAZOLIN 2 GRAM/50 ML IN DEXTROSE (ISO-OSMOTIC) INTRAVENOUS PIGGYBACK: 2 2 gram/50 mL | INTRAVENOUS | Qty: 50

## 2013-01-06 MED FILL — PROPOFOL INFUSION 10 MG/ML: 10 10 mg/mL | INTRAVENOUS | Qty: 100

## 2013-01-06 MED FILL — FENTANYL (PF) 50 MCG/ML INJECTION SOLUTION: 50 50 mcg/mL | INTRAMUSCULAR | Qty: 2

## 2013-01-06 MED FILL — HYDROCODONE 5 MG-ACETAMINOPHEN 325 MG TABLET: 5-325 5-325 mg | ORAL | Qty: 1

## 2013-01-06 MED FILL — BUPIVACAINE (PF) 0.5 % (5 MG/ML) INJECTION SOLUTION: 0.5 0.5 % (5 mg/mL) | INTRAMUSCULAR | Qty: 60

## 2013-01-06 MED FILL — ONDANSETRON HCL (PF) 4 MG/2 ML INJECTION SOLUTION: 4 4 mg/2 mL | INTRAMUSCULAR | Qty: 2

## 2013-01-06 MED FILL — LIDOCAINE (PF) 10 MG/ML (1 %) INJECTION SOLUTION: 10 10 mg/mL (1 %) | INTRAMUSCULAR | Qty: 60

## 2013-01-06 NOTE — Unmapped (Signed)
RN pre op start.

## 2013-01-06 NOTE — Unmapped (Signed)
Anesthesia Post Note    Patient: Sarah [name]    Procedure(s) Performed: Procedure(s):  LEFT CARPAL TUNNEL RELEASE     Anesthesia type: MAC    Patient location: Same Day Surgery    Post pain: Adequate analgesia    Post assessment: no apparent anesthetic complications, tolerated procedure well and no evidence of recall    Last Vitals:   Filed Vitals:    01/06/13 1015   BP: 122/59   Pulse: 71   Temp:    Resp: 17   SpO2: 95%       Post vital signs: stable    Level of consciousness: awake, alert  and oriented    Complications: None

## 2013-01-06 NOTE — Unmapped (Signed)
RN pre op stop.  Anesthesia and surgeon to see pt.

## 2013-01-06 NOTE — Unmapped (Signed)
ANESTHESIOLOGY PRE-PROCEDURAL EVALUATION    Sarah [name] is a 27 y.o. year old female presenting for:    Procedure(s):  LEFT CARPAL TUNNEL RELEASE     Surgeon:   Joylene John, MD    Anesthesia Evaluation    Patient summary reviewed, nursing notes reviewed and CPC/PAT note reviewed.  All other systems reviewed and are negative.   Unable to obtain patient history.      History of anesthetic complications   I have reviewed the History and Physical Exam, any relevant changes are noted in the anesthesia pre-operative evaluation.    Medical History / Review of Systems:    Cardiovascular:    Exercise tolerance: poor  Duke Met score: 3 - Walking on a flat surface for one or two blocks.Hypertension (controlled w/meds: lisinopril) is.    (-) pulmonary hypertension, valvular problems/murmurs, past MI, CAD, cardiomyopathy, CABG/stent, dysrhythmias, angina, CHF, orthopnea, PND.    Neuro/Muscoloskeletal/Psych:      (-) seizures, TIA, CVA, headaches, psychiatric history, arthritis, back problems.     Pulmonary:    (+) asthma (Childhood asthma).    (-) no pneumonia, COPD, shortness of breath, recent URI, sleep apnea, no active tuberculosis.       GI/Hepatic/Renal:    GERD (occasional GERD: no meds necessary) is.    No bowel prep.  (-) no hiatal hernia, PUD, hepatitis, liver disease, renal disease, no difficulty swallowing, no end stage liver disease.    Endo/Other:        (-) diabetes mellitus, hypothyroidism, hyperthyroidism, no anemia, no thrombocytopenia, no bleeding disorder. No opioid use      PAST MEDICAL HISTORY:  Past Medical History   Diagnosis Date   ??? Anxiety    ??? Bipolar affective disorder    ??? Hypertension    ??? PCOS (polycystic ovarian syndrome)    ??? Morbid obesity    ??? UTI (lower urinary tract infection) 01/20/2012   ??? 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 Acute bronchitis 03/02/2012   ??? Back pain 05/04/2012   ??? Fatigue 07/15/2011   ??? Drug-induced acute pancreatitis 05/26/2012     Victoza   ??? Carpal tunnel  syndrome, left 12/16/2012   ??? Herpes zoster 12/13/2012   ??? Chronic acquired lymphedema    ??? PONV (postoperative nausea and vomiting)    ??? Asthma      as a child, no issue since age 27       PAST SURGICAL HISTORY:  Past Surgical History   Procedure Laterality Date   ??? Cholecystectomy         FAMILY HISTORY:  Family History   Problem Relation Age of Onset   ??? Hypertension Mother    ??? Diabetes Father    ??? Hypertension Father    ??? Mental illness Maternal Uncle      Schizophrenia   ??? Mental illness Paternal Grandfather      Schizophrenia   ??? Deep vein thrombosis Sister    ??? Pulmonary embolism Sister        SOCIAL HISTORY:  History     Social History   ??? Marital Status: Single     Spouse Name: N/A     Number of Children: N/A   ??? Years of Education: N/A     Occupational History   ??? Not on file.     Social History Main Topics   ??? Smoking status: Never Smoker    ??? Smokeless tobacco: Never Used   ??? Alcohol Use: Yes  Comment: Occasionally   ??? Drug Use: No   ??? Sexually Active: Yes -- Female partner(s)     Other Topics Concern   ??? Caffeine Use Yes   ??? Occupational Exposure No   ??? Exercise Yes   ??? Seat Belt Yes     Social History Narrative   ??? No narrative on file       MEDICATIONS:  No current facility-administered medications on file prior to encounter.     Current Outpatient Prescriptions on File Prior to Encounter   Medication Sig Dispense Refill   ??? citalopram (CELEXA) 20 MG tablet Take 20 mg by mouth daily.       ??? topiramate (TOPAMAX) 100 MG tablet Take 100 mg by mouth daily.        ??? ibuprofen (ADVIL,MOTRIN) 800 MG tablet Take 1 tablet (800 mg total) by mouth every 8 hours as needed.  60 tablet  0   ??? penicillin G benzathine (BICILLIN L-A) 2,400,000 unit/4 mL Syrg Inject into the muscle. 2.4 Million Units by Intramuscular route every 21 days.           ALLERGIES:  Allergies   Allergen Reactions   ??? Clindamycin      When took oral form throat swelled for a few days, but has taken since then with no reaction       VITAL  SIGNS:  Wt Readings from Last 3 Encounters:   01/06/13 351 lb 6.6 oz (159.4 kg)   01/06/13 351 lb 6.6 oz (159.4 kg)   12/24/12 355 lb (161.027 kg)     Ht Readings from Last 3 Encounters:   01/06/13 5' 7 (1.702 m)   01/06/13 5' 7 (1.702 m)   12/24/12 5' 7 (1.702 m)     Temp Readings from Last 3 Encounters:   01/06/13 98 ??F (36.7 ??C) Oral   01/06/13 98 ??F (36.7 ??C) Oral   12/24/12 98.6 ??F (37 ??C) Oral     BP Readings from Last 3 Encounters:   01/06/13 134/63   01/06/13 134/63   12/24/12 142/82     Pulse Readings from Last 3 Encounters:   01/06/13 67   01/06/13 67   09/16/12 84     SpO2 Readings from Last 3 Encounters:   01/06/13 96%   01/06/13 96%   09/16/12 97%       Physical Exam:    Airway:     Mallampati: II  Mouth Opening: >2 FB  TM distance: > = 3 FB  Neck ROM: full  (-) no facial hair, neck not short      Dental:   - normal exam     Pulmonary:   - normal exam     Cardiovascular:  - normal exam     Neuro/Musculoskeletal/Psych:  - normal neurological exam.         Abdominal:       Current OB Status:       Other Findings:        LAB RESULTS:  Lab Results   Component Value Date    WBC 9.2 09/14/2012    HGB 12.5 09/14/2012    HCT 36.8 09/14/2012    MCV 81.3 09/14/2012    PLT 346 09/14/2012       No results found for this basename: ABORH       Lab Results   Component Value Date    GLUCOSE 98 09/14/2012    BUN 11 09/14/2012    CO2 25 09/14/2012  CREATININE 0.78 09/14/2012    K 4.1 09/14/2012    NA 142 09/14/2012    CL 106 09/14/2012    CALCIUM 9.6 09/14/2012    ALBUMIN 3.9 07/22/2011    PROT 7.0 07/22/2011    ALKPHOS 81 07/22/2011    ALT 23 07/22/2011    AST 20 07/22/2011    BILITOT 0.3 07/22/2011       No results found for this basename: PT, PTT, INR       Lab Results   Component Value Date    PREGTESTUR Negative 01/06/2013       Anesthesia Plan:    ASA 2     Anesthesia Type:  MAC.     Intravenous induction.    Anesthetic plan and risks discussed with patient.    Plan, alternatives, and risks of anesthesia, including death, have  been explained to and discussed with the patient/legal guardian.  By my assessment, the patient/legal guardian understands and agrees.  Scenario presented in detail.  Questions answered.    Blood products not discussed.  Plan discussed with CRNA and attending.

## 2013-01-06 NOTE — Unmapped (Signed)
Discharge instructions reviewed with pt and father; questions answered; verbalize understanding; feels pain is tolerable at this time; feels ready to go home; currently in BR voiding; approximately five minutes after ingesting Norco, pt states that the last time she had Vicodin she felt paranoid; instructed to notify surgeon if that occurs again this time; verbalizes understanding; stable for discharge.

## 2013-01-06 NOTE — Unmapped (Signed)
LEFT CARPAL TUNNEL RELEASE   Procedure Note    Iona Stay  01/06/2013      Pre-op Diagnosis: LEFT CARPAL TUNNEL SYNDROME       Post-op Diagnosis: same    Procedure(s):  LEFT CARPAL TUNNEL RELEASE       Surgeon(s):  Joylene John, MD    Anesthesia: MAC (Monitor Anesthesia Care)    Staff:   Circulator: Colon Branch, RN  Scrub Person: Chad Cordial, ST  Assistant: Burman Blacksmith, CSA  Preop Nurse: Eligha Bridegroom, RN; Zerita Boers, RN    Estimated Blood Loss: Minimal                 Specimens: * No specimens in log *           Drains:             There were no complications unless listed below.        Zachery Dauer     Date: 01/06/2013  Time: 8:32 AM

## 2013-01-06 NOTE — Unmapped (Signed)
Pt difficult stick.  After multiple attempts by 2 RNs, with pt permission, successful IV started by Donivan Scull RN, to right hand.

## 2013-01-06 NOTE — Unmapped (Signed)
Arrival to [facility], bed 10; report received from CRNA; assessment as noted; HOB elevated; Dr. Gaye Pollack in to speak with pt; multiple questions asked per pt; questions answered; given ice water @ 0850; small sip taken and verbalizes feeling of nausea one minute later; cool wash cloth applied to forehead; emesis basin in hand.

## 2013-01-06 NOTE — Unmapped (Signed)
[place]                              [facility]     PATIENT NAME:   Sarah [name], Sarah [name]                    MRN: [id number]  DATE OF BIRTH:  [birth year]                     CSN: [id number]  SURGEON:        Joylene John, M.D.           ADMIT DATE: 01/06/2013  SERVICE:        Hand Surgery  DICTATED BY:    Joylene John, M.D.           SURGERY DATE: 01/06/2013                                    OPERATIVE REPORT     INCOMPLETE DICTATION - 01/07/13 - akg     SURGEON:  Joylene John, M.D.     PREOPERATIVE DIAGNOSIS(ES):  Left carpal tunnel syndrome.     POSTOPERATIVE DIAGNOSIS(ES):  Left carpal tunnel syndrome.     PROCEDURE(S) PERFORMED:  Left open carpal tunnel release.     SPECIMEN(S):  None.     COMPLICATION(S):  None.     DISPOSITION:  Stable to the post anesthesia recovery unit.     INDICATION(S):  The patient is a 27 year old right hand dominant young woman  who presented with a six month history of numbness and tingling on the dorsum of her  wrist and wrist pain that began after a hospitalization in February of this  Year. She associated the pain with placement of an IV.   Her biggest complaint was of wrist pain. A thorough workup was completed.  She did also complain of  numbness and tingling in the fingers made worse with activities.  An EMG was  obtained, which was negative for compression of the median nerve at the  wrist; however, her symptoms were consistent with carpal tunnel syndrome.  A carpal tunnel  injection was performed in the clinic, and she gained relief from this; however, her  symptoms recurred a month later.  Upon discussion with her she did wish for  the relief that she had gained with the carpal tunnel injection.  She  understood the risks of surgery to be bleeding, infection, neurovascular  injury, incomplete relief, worsening of symptoms, and potential need for  reoperation.  She understood that and wished to proceed.     DETAILS OF PROCEDURE(S):  On the  day of surgery the patient was met in the  preoperative holding area.  She was marked on the left volar wrist.  She was  brought into the operating room and placed in supine position on the  operating room table.  All of her bony prominences were padded.  Managed  anesthesia care was administered.  A timeout was taken.  All parties were in  agreement.  She was prepped volarly over the left wrist and injected in the  surgical area with 7 mL of a mixture of 1% plain lidocaine and 0.25%  Marcaine.  The arm was then prepped and draped in sterile fashion.  Appropriate weight-based dosing antibiotics were given, given her history of  recurrent cellulitis.  The limb was exsanguinated, and the tourniquet was  elevated to 250 mmHg.     A longitudinal 3-cm incision was made in line with the ring finger axis from  the distal wrist crease towards Kaplan's cardinal line.  The skin and  subcutaneous tissues were divided in line with the skin incision.  The  palmaris fascia was divided longitudinally.  The transverse carpal ligament  was then seen deep to this.  The transverse carpal ligament was cleaned and  divided in line with the ring finger axis all the way distally until the  perivascular fat was seen and there were no remaining crossing fibers.  Attention was then turned proximally.  The remaining fibers of the transverse  carpal ligament and antebrachial forearm fascia were cleaned both  superficially and deep and divided in line with the ring finger axis under  direct visualization.  The wound was then irrigated.  Bipolar electrocautery  was used for hemostasis, and the skin edges were closed using 4-0 nylon in an  interrupted fashion.  Xeroform was applied as well as a well-padded volar  splint.  Her tourniquet was let down.  Her fingers were pink and warm.  The  patient was then awoken and brought to the recovery unit in stable condition.     POSTOPERATIVE PLAN:  She will remove her dressing at one week postoperatively.    At that time she can get the incision wet for showers and hand washing.   No baths or tub soaks. She will be given a prescription for pain medication,   And is encouraged to take NSAIDs instead of or in addition to the pain medicine.   She will follow up in 2 weeks time for removal of sutures. We will initiate   Therapy at that time if need be.                                               Joylene John, M.D.  MS/jlq  D:  01/06/2013 11:38  T:  01/06/2013 16:32  Job #:  [id number]     c:   Joylene John, M.D.     OPERATIVE REPORT                                             PAGE    1 of   1

## 2013-01-06 NOTE — Unmapped (Signed)
States pain is tolerable; discharged with sling and ice pack in place; encouraged pt to rotate shoulder several times a day to prevent shoulder from locking; verbalizes understanding.

## 2013-01-06 NOTE — Unmapped (Signed)
Ancef 2 G IVPB on call to OR.

## 2013-01-06 NOTE — Unmapped (Signed)
Nausea continues; father at bedside; Dr. Milly Jakob called for anesthesia orders.

## 2013-01-06 NOTE — Unmapped (Signed)
Anesthesia Transfer of Care Note    Patient: Sarah [name]  Procedure(s) Performed: Procedure(s):  LEFT CARPAL TUNNEL RELEASE     Patient location: Same Day Surgery    Post pain: Adequate analgesia    Post assessment: no apparent anesthetic complications    Post vital signs:    Filed Vitals:    01/06/13 0835   BP: 132/67   Pulse: 75   Temp:    Resp: 16   SpO2: 94%       Level of consciousness: awake    Complications: None

## 2013-01-06 NOTE — Unmapped (Addendum)
Hand Surgery, Dr. Joylene John  [facility]    PRESCRIPTIONS:  Norco 5/325 take 1 tablet by mouth every 4-6 hours as needed for pain.     -You may also take over the counter ibuprofen/aleve and tylenol for pain. Take this as directed on the packaging. Do not exceed 4000 mg tylenol/acetaminophen in 24 hours.      ICE AND ELEVATION:   You may use ice for the first 12 hours, but it is not critical.  Elevation, as much as possible for the next 48 hours, is critical for decreasing swelling as well as for pain relief. Elevation means when you are seated or lying down, you hand should be at or above your heart. When walking, the hand needs to at above your elbow. If the bandage gets too tight, the ace bandage may be looseded. Motion of your fingers also helps to decrease the swelling.     DRESSINGS:  -Please keep the dressing as it is for 7 days after today and keep it dry. You may place a plastic bag over your splint or cast to shower, but be careful. Then you may remove all dressings down to the stitches or steri-strips. At this time, it is OK to get the would wet in a shower.     HAND THERAPY:  -You should not need any. If you do we will evaluate for this in the clinic.     ACTIVITY AND WORK:   You are encouraged to move any fingers which are not in the bandage. Attached is an instruction sheet on finger motion. Do this as much as you need to make your fingers move fully and keep the swelling down. Light use of the fingers is allowed to assist the other hand with daily hygiene and eating, but strong gripping or lifting is often uncomfortable and detrimental to the healing process and should be avoided.    You might miss a variable period of time from work and hopefully this issue has been discussed prior to surgery. You may not do any heavy work with your affected hand for about 4 weeks. Please share this information with your employer. Please bring any forms that need to be filled out  to my medical assistant.    EXPECTATIONS:    PAIN: Hopefully, within the first 4-5 days, you pain will lessen to where you will need less pain medication. Please use your pain medication carefully, as we will probably only renew your medication once if needed.    SCAR: The scar will mature over the next 6 months to a year and will become much less visible and tender. The tenderness can be improved by rubbing the scar gently and using lotion after the stitches have tender.      CONCERNS: If your discomfort or swelling increases, after initially getting better in the first few days, infection may be a concern. Normal time for infection (which is very rare) is the fourth or fifth day. If you experience symptoms, contact Amy at [phone]. We may need you to come into the clinic for an evaluation.     FOLLOW-UP APPOINTMENT:   -Already been made for you 01/21/2013 10:00 AM to see Dr. Gaye Pollack at [facility]       For after hour [facility]ES please call [phone] and ask for the Orthopaedic Physician on call.

## 2013-01-07 NOTE — Unmapped (Signed)
Spoke with patient and let her know her concerns about the burning sensation she is having in her wrist is normal post-op feeling.  Informed pt to take ibuprofen.

## 2013-01-21 ENCOUNTER — Ambulatory Visit: Admit: 2013-01-21 | Discharge: 2013-01-21 | Payer: PRIVATE HEALTH INSURANCE | Attending: Hand Surgery

## 2013-01-21 DIAGNOSIS — G5600 Carpal tunnel syndrome, unspecified upper limb: Secondary | ICD-10-CM

## 2013-01-21 NOTE — Unmapped (Signed)
[facility]  AND SPORTS MEDICINE     PATIENT NAME:  Sarah [name], Sarah [name]                   MRN:  [id number]  DATE OF BIRTH:  1986-04-03                   CSN:  [id number]  DICTATED BY:   Joylene John, M.D.          VISIT DATE:  01/21/2013                                       OFFICE NOTE     SUBJECTIVE:  Sarah [name] is now 2 weeks status post left carpal tunnel release.  She said the numbness and tingling in her fingers is completely gone.  She  feels some pulling in her thumb and her small and ring finger when she is  making a fist but does not have any significant complaints of pain.     On physical exam her incision is healing nicely.  There is no erythema or  edema. There is no tenderness radially or ulnarly to the incision.  She is  able to make a full fist. She can oppose the thumb to the small finger  although this is a little bit of work for her.  Her sensation is intact  distally in all of the fingers.  There is no paresthesias. She has brisk  capillary refill.     ASSESSMENT:  This is a 27 year old right-hand dominant woman now 2 weeks  status post left carpal tunnel release.     PLAN:  I will take her sutures out today. I have recommended that she begin  hand therapy, re-initiate this with Clydie Braun. We have given her a prescription  for this and I will see her back in 1 months' time. No heavy lifting for 2  more weeks.                                                Joylene John, M.D.  MS/tmg  D:  01/21/2013 17:13  T:  01/26/2013 06:30  Job #:  [id number]                                                      OFFICE NOTE                                                               PAGE    1 of   1

## 2013-01-21 NOTE — Unmapped (Signed)
Sutures were removed per the doctor's instruction.   Patient tolerated this well with no complications.   Steri strips were applied.   Patient was given instructions regarding showering/bathing.

## 2013-01-21 NOTE — Unmapped (Signed)
See transcribed note for additional clinical documentation for this visit.

## 2013-02-01 NOTE — Unmapped (Signed)
Pt called stating she fell walking up the stairs and landed on her hand where the incision was.  Pt stated the scar that was flesh colored is now deep pink and her hand is hurting more and bruising some.  Per Dr. Gaye Pollack I told her that some bruising was okay and the pain was normal. I told the patient to call the hand therapist and get an earlier appointment with them so they can help with swelling and some exercises to help with pain.

## 2013-02-18 ENCOUNTER — Ambulatory Visit: Payer: PRIVATE HEALTH INSURANCE | Attending: Hand Surgery

## 2013-03-17 ENCOUNTER — Ambulatory Visit: Admit: 2013-03-17 | Payer: PRIVATE HEALTH INSURANCE

## 2013-03-17 ENCOUNTER — Inpatient Hospital Stay: Admit: 2013-03-17 | Payer: PRIVATE HEALTH INSURANCE

## 2013-03-17 DIAGNOSIS — R300 Dysuria: Secondary | ICD-10-CM

## 2013-03-17 LAB — BASIC METABOLIC PANEL
Anion Gap: 13 mmol/L (ref 3–16)
BUN: 11 mg/dL (ref 7–25)
CO2: 25 mmol/L (ref 21–33)
Calcium: 9.4 mg/dL (ref 8.6–10.2)
Chloride: 105 mmol/L (ref 98–110)
Creatinine: 0.79 mg/dL (ref 0.50–1.20)
GFR MDRD Af Amer: 106 See note.
GFR MDRD Non Af Amer: 88 See note.
Glucose: 105 mg/dL (ref 65–99)
Osmolality, Calculated: 296 mOsm/kg (ref 278–305)
Potassium: 4.2 mmol/L (ref 3.5–5.3)
Sodium: 143 mmol/L (ref 135–146)

## 2013-03-17 LAB — DIFFERENTIAL
Basophils Absolute: 101 /uL (ref 0–200)
Basophils Relative: 0.8 % (ref 0.0–1.0)
Eosinophils Absolute: 202 /uL (ref 15–500)
Eosinophils Relative: 1.6 % (ref 0.0–8.0)
Lymphocytes Absolute: 2911 /uL (ref 850–3900)
Lymphocytes Relative: 23.1 % (ref 15.0–45.0)
Monocytes Absolute: 643 /uL (ref 200–950)
Monocytes Relative: 5.1 % (ref 0.0–12.0)
Neutrophils Absolute: 8744 /uL (ref 1500–7800)
Neutrophils Relative: 69.4 % (ref 40.0–80.0)

## 2013-03-17 LAB — POCT URINALYSIS DIPSTICK, NONAUTOMATED; W/O MICRO
POCT - Bilirubin, UA: NORMAL
POCT - Glucose, UA: NORMAL
POCT - Ketones, UA: NORMAL
POCT - Nitrite, UA: NEGATIVE
POCT - Other Cells, UA: NORMAL
POCT - Protein, UA: NEGATIVE
POCT - Specific Gravity, Urine: 1.01
POCT - Urobilinogen, UA: NORMAL
POCT - pH, UA: 8

## 2013-03-17 LAB — CBC
Hematocrit: 41 % (ref 35.0–45.0)
Hemoglobin: 13.6 g/dL (ref 11.7–15.5)
MCH: 27.7 pg (ref 27.0–33.0)
MCHC: 33.2 g/dL (ref 32.0–36.0)
MCV: 83.5 fL (ref 80.0–100.0)
MPV: 9.7 fL (ref 7.5–11.5)
Platelets: 330 10*3/uL (ref 140–400)
RBC: 4.91 10*6/uL (ref 3.80–5.10)
RDW: 16.1 % (ref 11.0–15.0)
WBC: 12.6 10*3/uL (ref 3.8–10.8)

## 2013-03-17 LAB — URINE CULTURE

## 2013-03-17 LAB — MAGNESIUM: Magnesium: 1.8 mg/dL (ref 1.5–2.5)

## 2013-03-17 MED ORDER — meclizine (ANTIVERT) 12.5 mg tablet
12.5 | ORAL_TABLET | Freq: Three times a day (TID) | ORAL | Status: AC | PRN
Start: 2013-03-17 — End: 2014-04-28

## 2013-03-17 MED ORDER — ciprofloxacin (CIPRO) 500 MG tablet
500 | ORAL_TABLET | Freq: Two times a day (BID) | ORAL | Status: AC
Start: 2013-03-17 — End: 2013-03-22

## 2013-03-17 NOTE — Unmapped (Signed)
Subjective  HPI:   Patient ID: Sarah [name] is a 27 y.o. female.    Chief Complaint:  Dysuria   The current episode started yesterday. The problem occurs every urination. The pain is mild. There has been no fever. The fever has been present for 5 days or more. Associated symptoms include chills, frequency, hesitancy, nausea and vomiting. Pertinent negatives include no hematuria or urgency.          Last week Sunday prior dizziness, then gotten worse progressively to feeling like she was drunk and she threw up twice, couldn't stand up.   Leg-started last Wednesday she gets cellulitis got swollen, gets a bicillin shot on Thursday and ID said no redness no warmth,   There is a red healing mosquito bite on left lower leg they are watching it.        Medications:  Current Outpatient Prescriptions   Medication Sig Dispense Refill   ??? citalopram (CELEXA) 20 MG tablet Take 20 mg by mouth daily.       ??? HYDROcodone-acetaminophen (NORCO) 5-325 mg per tablet Take 1 tablet by mouth every 6 hours as needed for Pain.  30 tablet  0   ??? ibuprofen (ADVIL,MOTRIN) 800 MG tablet Take 1 tablet (800 mg total) by mouth every 8 hours as needed.  60 tablet  0   ??? lisinopril (PRINIVIL,ZESTRIL) 10 MG tablet Take 1 tablet (10 mg total) by mouth daily.  90 tablet  1   ??? penicillin G benzathine (BICILLIN L-A) 2,400,000 unit/4 mL Syrg Inject into the muscle. 2.4 Million Units by Intramuscular route every 21 days.       ??? penicillin G procaine-penicillin G benzathine (BICILLIN C-R) 1,200,000 unit/ 2 mL(600k/600k) injection Inject into the muscle. 1.2 Million Units by Intramuscular route once. Every 3 weeks       ??? topiramate (TOPAMAX) 100 MG tablet Take 100 mg by mouth daily.          No current facility-administered medications for this visit.        ROS:   Review of Systems   Constitutional: Positive for chills.   Gastrointestinal: Positive for nausea and vomiting.   Genitourinary: Positive for dysuria, hesitancy and frequency. Negative for  urgency and hematuria.   All other systems reviewed and are negative.           Objective:   Physical Exam   Constitutional: She is oriented to person, place, and time. She appears well-developed and well-nourished.   HENT:   Head: Normocephalic and atraumatic.   Right Ear: External ear normal.   Left Ear: External ear normal.   Nose: Nose normal.   Mouth/Throat: Oropharynx is clear and moist.   Eyes: Conjunctivae and EOM are normal. Pupils are equal, round, and reactive to light.   Neck: Normal range of motion. Neck supple. No thyromegaly present.   Cardiovascular: Normal rate, normal heart sounds and intact distal pulses.    Pulmonary/Chest: No respiratory distress. She has no wheezes.   Abdominal: Soft. Bowel sounds are normal. She exhibits no distension. There is no tenderness.   Musculoskeletal: Normal range of motion. She exhibits edema.   Neurological: She is alert and oriented to person, place, and time. No cranial nerve deficit. Coordination normal.   Skin: Skin is warm and dry.   Psychiatric: She has a normal mood and affect. Her behavior is normal. Judgment and thought content normal.             Filed Vitals:  03/17/13 1548   BP: 112/74   Temp: 98.6 ??F (37 ??C)   TempSrc: Oral   Height: 5' 7 (1.702 m)   Weight: 363 lb (164.656 kg)     Body mass index is 56.84 kg/(m^2).  Body surface area is 2.79 meters squared.          Assessment/Plan:     Patient Active Problem List   Diagnosis   ??? HTN, goal below 140/90   ??? Recurrent cellulitis of lower leg   ??? PCOS (polycystic ovarian syndrome)   ??? Fatigue   ??? Back pain   ??? Hand pain   ??? Depression   ??? Vaginal bleeding   ??? Carpal tunnel syndrome, left   we had Esma get her blood drawn today and sent the urine for culture.   Lab Results   Component Value Date    WBC 12.6* 03/17/2013    HGB 13.6 03/17/2013    HCT 41.0 03/17/2013    MCV 83.5 03/17/2013    PLT 330 03/17/2013     Lab Results   Component Value Date    CREATININE 0.79 03/17/2013     Lab Results    Component Value Date    NA 143 03/17/2013    K 4.2 03/17/2013    CL 105 03/17/2013    CO2 25 03/17/2013        Sarah [name] was seen today for dysuria and dizziness.    Diagnoses and associated orders for this visit:    Dysuria  - POCT Urinalysis Dipstick, nonautomated; w/o micro  - CBC; Future  - Differential; Future  - Basic metabolic panel; Future  - Magnesium; Future  - Urine culture; Future  - ciprofloxacin (CIPRO) 500 MG tablet; Take 1 tablet (500 mg total) by mouth 2 times a day.    Dizziness  - CBC; Future  - Differential; Future  - Basic metabolic panel; Future  - Magnesium; Future  - Urine culture; Future  - meclizine (ANTIVERT) 12.5 mg tablet; Take 1 tablet (12.5 mg total) by mouth 3 times a day as needed.    Headache  - CBC; Future  - Differential; Future  - Basic metabolic panel; Future  - Magnesium; Future  - Urine culture; Future        Windy Kalata, MD.  [facility]

## 2013-03-20 NOTE — Unmapped (Signed)
On call - 03/20/13 @ 645 PM    Recurring HA/dizziness for > 1 week - s/p recent eval in office.  No F/URI symptoms/cough.  + N/V with dizziness/HA.  H/o migraines- quiet for some time.  No response to meclizine.    Dizziness/vertigo/HA/nausea - ?peripheral vertigo vs migraine variant.  Trial Phenergan - has some at home - 25 mg hs tonight.  Medication use and side effects discussed with patient.   Call with problems.

## 2013-03-22 MED ORDER — butalbital-acetaminophen-caffeine (FIORICET, ESGIC) 50-325-40 mg per tablet
50-325-40 | ORAL_TABLET | Freq: Four times a day (QID) | ORAL | Status: AC | PRN
Start: 2013-03-22 — End: 2021-09-09

## 2013-03-22 NOTE — Unmapped (Signed)
Pt advised of pcp recommend .  Med called into pharm

## 2013-03-22 NOTE — Unmapped (Signed)
Patient states that Dr. Daleen Snook wanted to know how she was doing (per your phone discussion earlier this week). She stated there was No Change

## 2013-03-22 NOTE — Unmapped (Signed)
Rec trial of Fioricet - call in - and appt if persists or increases.

## 2013-08-15 DIAGNOSIS — M2240 Chondromalacia patellae, unspecified knee: Secondary | ICD-10-CM | POA: Insufficient documentation

## 2013-10-13 DIAGNOSIS — F315 Bipolar disorder, current episode depressed, severe, with psychotic features: Secondary | ICD-10-CM | POA: Insufficient documentation

## 2013-10-16 DIAGNOSIS — R0602 Shortness of breath: Secondary | ICD-10-CM | POA: Insufficient documentation

## 2013-10-16 DIAGNOSIS — J309 Allergic rhinitis, unspecified: Secondary | ICD-10-CM | POA: Insufficient documentation

## 2013-10-20 DIAGNOSIS — IMO0001 Reserved for inherently not codable concepts without codable children: Secondary | ICD-10-CM | POA: Insufficient documentation

## 2013-10-20 DIAGNOSIS — R209 Unspecified disturbances of skin sensation: Secondary | ICD-10-CM

## 2013-12-10 DIAGNOSIS — K219 Gastro-esophageal reflux disease without esophagitis: Secondary | ICD-10-CM | POA: Insufficient documentation

## 2014-01-11 DIAGNOSIS — H02009 Unspecified entropion of unspecified eye, unspecified eyelid: Secondary | ICD-10-CM | POA: Insufficient documentation

## 2014-02-18 ENCOUNTER — Inpatient Hospital Stay
Admit: 2014-02-18 | Discharge: 2014-02-18 | Disposition: A | Discharge status: Home / Self Care | Payer: PRIVATE HEALTH INSURANCE

## 2014-02-18 ENCOUNTER — Inpatient Hospital Stay
Admit: 2014-02-18 | Discharge: 2014-02-18 | Disposition: A | Discharge status: Home / Self Care | Payer: Medicaid (Managed Care)

## 2014-02-18 DIAGNOSIS — R45851 Suicidal ideations: Secondary | ICD-10-CM

## 2014-02-18 DIAGNOSIS — F339 Major depressive disorder, recurrent, unspecified: Secondary | ICD-10-CM

## 2014-02-18 MED ORDER — busPIRone (BUSPAR) 10 MG tablet
10 | ORAL_TABLET | Freq: Two times a day (BID) | ORAL | Status: AC
Start: 2014-02-18 — End: 2014-04-28

## 2014-02-18 MED ORDER — acetaminophen (TYLENOL) tablet 650 mg
325 | Freq: Once | ORAL | Status: AC
Start: 2014-02-18 — End: 2014-02-18
  Administered 2014-02-18: 08:00:00 650 mg via ORAL

## 2014-02-18 MED ORDER — LORazepam (ATIVAN) tablet 0.5 mg
1 | Freq: Once | ORAL | Status: AC
Start: 2014-02-18 — End: 2014-02-18
  Administered 2014-02-18: 07:00:00 0.5 mg via ORAL

## 2014-02-18 MED ORDER — busPIRone (BUSPAR) 10 MG tablet
10 | ORAL_TABLET | Freq: Two times a day (BID) | ORAL | Status: AC
Start: 2014-02-18 — End: 2014-02-18

## 2014-02-18 MED ORDER — ondansetron (ZOFRAN) tablet 4 mg
4 | Freq: Once | ORAL | Status: AC
Start: 2014-02-18 — End: 2014-02-18
  Administered 2014-02-18: 07:00:00 4 mg via ORAL

## 2014-02-18 MED FILL — TYLENOL 325 MG TABLET: 325 325 mg | ORAL | Qty: 2

## 2014-02-18 MED FILL — ONDANSETRON HCL 4 MG TABLET: 4 4 MG | ORAL | Qty: 1

## 2014-02-18 MED FILL — LORAZEPAM 1 MG TABLET: 1 1 MG | ORAL | Qty: 1

## 2014-02-18 NOTE — Unmapped (Signed)
[facility]  Psychiatric Social Worker Assessment Consult Note      Sarah [name]    [id number]    Chief complaint in patient's own words:: I came here to get help for my anxiety and They told me I have to wait 3 weeks but I cant wait that long.     Clinician's description of presenting problem: the pt is a 28 year old Caucasian Female self-referred to [facility] with a chief complaint of Anxiety. The pt is accompanied by her female friend, and she reported to me that she come to the ER tonight to get help with my Anxiety. She reported she has gotten mental health treatment in the past, but does not have any at this time because she has recently changed her insurance to [facility], managed by [facility]. She is a resident of [facility], and is scheduled to start services at an Agency called [facility]. She reported I have to go there for 3 weeks before I can see a Psychiatrist and get medications, and I cant wait that long. She is adamant that she needs medication for her Anxiety right away. We discussed the need for her to form a relationship with a doctor who can get to know her and prescribed the medication that is best for her condition. She has researched outpatient providers and found out which ones will, or will not, accept her medical insurance.     History:  History of Present Illness:    Anxiety      ???  Bipolar affective disorder      ???  Hypertension      ???  PCOS (polycystic ovarian syndrome)      ???  Morbid obesity      ???  UTI (lower urinary tract infection)  01/20/2012    ???  Recurrent cellulitis of lower leg  07/15/2011    ???  Acute bronchitis  03/02/2012    ???  Back pain  05/04/2012    ???  Fatigue  07/15/2011    ???  Drug-induced acute pancreatitis  05/26/2012        Victoza    ???  Carpal tunnel syndrome, left  12/16/2012    ???  Herpes zoster  12/13/2012    ???  Chronic acquired lymphedema      ???  PONV (postoperative nausea and vomiting)      ???  Asthma          as a child, no issue since age 65             Past Surgical History    Procedure  Laterality  Date    ???  Cholecystectomy        ???  Knee cartilage surgery        Psychiatric History: The pt reported she was admitted at the [facility] last year for Anxiety. She reported she was self-referred to them. She reluctantly admitted she was referred to [facility] program and said They wanted me to do Groups, and I dont like groups, I like 1 on 1 therapy.   She reported she was admitted to [facility] for Anxiety but was transferred to their ICU because I had some kind of reaction to the Seroquel and some other medication..I think I had a stroke.   She reported she was getting Celexa prescribed by her MD, but she had to change insurance, and consequently her doctor. Now she takes the Celexa PRN.  It should be noted that the MD gave her some Ativan here in the ED, and the pt told this writer I could have taken a half an Ativan at my Home!  She reported she has called many outpatient providers of Mental Health services in [place] and [place], and many do not take her Insurance. She is scheduled for an Intake at [facility] next week.     Chemical Dependency History:    Chemical Dependency History: Denied by pt, none in records.     Social History, Support System and Current Living Situation: The pt reported she is a resident of [place]. She did not give much information in this area, but reported she has Medicaid, managed by [facility].     Collateral Information:  Her good friend at there bedside. She drove the pt here. She reported that they called various hospitals on the phone on their way to [place] from [facility]. She reported they asked the various hospitals Where should we check in if she is suicidal?    Mental Status Exam:     Appearance and Behavior  Apparent Age: Appears Actual Age  Eye Contact: Appropriate  Appear/Hygiene: Well groomed;Obese  Patient Behaviors:  Calm;Cooperative;Hopeless  Physical Abnormalities: obese  Level of Alertness: Alert    Motor / Speech  Motor Activity: Restless  Speech: Logical/coherent    Affect / Thought  Affect: Irritable;Preoccupied  Patient's Reported Mood: depressed  Mood congruent with affect?: Yes  Thought Content: Death Wishes;Hopeless  Thought Processes: Circumstantial;Organized  Perception: Appropriate  Perception Assessment: no evidence of psychosis described or detected  Intelligence: Average  Insight: fair      Risk Factors/Stress Factors:    Stress Factors  Patient Stress Factors: Resistant to accept help;Loss of control  Family Stress Factors: Financial concerns  Has the patient had any recent losses?: She changed her insurance coverage, consequently changing providers.   Risk Factors  Assault Risk Assessment: No risk factors present  Self Harm/Suicidal Ideation Plan: reported vague SI, plan to cut self, no intent.   Previous Self Harm/Suicidal Plans: pt reported previous SI last year, no attempts, none in records.   Family Suicide History: Unknown  Current Plans of  Homicide or to Harm Another : none rpeorted  Previous Plans of Homicide or to Harm Another: none reported, none in records  Access to Lethal Means : none reported      Telepsychiatry Considerations: no statement of Belief on the pt.     Formulation and Plan: the pt is calm and cooperative, with her friend at the bedside. She is very interested in discussing ways to get anti-anxiety medication. She said I came here to get help, and she eventually admitted that meant medications. We discussed her efforts to get outpatient services close to her home, and whether she could access services here in [facility] instead. She told the MD, the Nurse and the PSW that she is not feeling suicidal at this time, but she was thinking about it a few days ago. This morning she is interested in speaking with a Psychiatrist and getting a prescription for medication, and I  discussed ways for her to get that, including going to [facility] in the case of an emergency. The pt and her friend reported they appreciated the information and discussion. The pt does not meet criteria for a Statement of Belief, and is safe for discharge once she is medically cleared.     Patient notified of plan:  Yes.  Patient reaction to plan: the pt was frustrated at first, and quickly became understanding and reasonable.   Transportation Agent notified of safety needs: not applicable.

## 2014-02-18 NOTE — Unmapped (Signed)
ED Attending Attestation Note    Date of service:  02/18/2014    This patient was seen by the resident physician.  I have seen and examined the patient, agree with the workup, evaluation, management and diagnosis. The care plan has been discussed and I concur.     My assessment reveals a 28 y.o. female calm cooperative with normal vital signs.

## 2014-02-18 NOTE — Unmapped (Signed)
Patient speaking with Doctor.

## 2014-02-18 NOTE — Unmapped (Signed)
28 year old white female sent from [facility] after patient went there due to anxiety and suicidal thoughts.  Patient reports an increase in her anxiety since 02/10/14 when her grandmother died.  She called several places but could not find anyone to take her insurance so she started feeling depressed and had suicidal thoughts. Patent denies current suicidal ideations but reports that she does not want to continue living this way if she can not get help for her anxiety.  Patient did have thoughts of running her car off the road 2 days ago.  Patient has no history of suicide attempts.  Patient with diagnosis of Bipolar with last admission 09-2013 at [facility].  Patient contracts for safety to wait in lobby with her friend.  Patient wants inpatient psychiatric admission.

## 2014-02-18 NOTE — Unmapped (Signed)
ED Screening Protocol - N/A

## 2014-02-18 NOTE — Unmapped (Signed)
If feeling suicidal, call the suicide hotline at 513-281-CARE (2273), 911, or return to the emergency room.     SUICIDE PREVENTION RESOURCES:   [place]:  513-281-CARE (2273) hotline      Psychiatric Emergency Services: [phone]      Mobile Crisis Team 513-584-5098   [place]:  [place] Consultation & Crisis: [phone]   [place]: Emergency Crisis Hotline: 513-528-SAVE (7283)   [place]: [place] Emergency Crisis Line: 859-331-3292   National:  National Hotline: 1-800-273-TALK (8255)      Www.suicidepreventionlifeline.orgg  ------------------------------------------------------------------------------------------------------------------  Discharge Instructions After an Episode of Suicide Threats or Actions   Remove all fireams, weapons (of any kind) or any unneeded medicines that could be used   Take person to follow-up mental health appointments   Have a loved one sign a release of information allowing you to contact the mental health provider   Be direct and talk openly about suicidal thoughts   Be willing to listen. Allow expressions of feelings   Block all inappropriate internet websites and social media   Offer to be available to them at any time   Be available to accompany them to the nearest emergency department   Call other family members or friends to help and offer support       Get help from agencies that specialize in crisis intervention   Using the information here and other community resources, create a suicide safety plan. Example: call support networks, crisis lines, 911, go to emergency room, call Mobile Crisis Team, call mental health provider    Warning Signs:   Severe depression with restlessness   Inability to sleep or sleeping too much   Withdrawing from friends, family and life   Increase alcohol and/or drug use   Giving away personal items   Anxiety, agitation, rage, or anger   Unpredictable mood swings   Feelings like there is no way  out   Hopelessness   Constantly talking or writing about death   Seeking out information about suicide    What Family Members & Friends Should Know:   Suicidal ideas are usually associated with treatable conditions   People who try or actually commit suicide try to let someone know by leaving a note   All suicide threats should be taken seriously   Suicidal ideas occur when people are depressed, intoxicated or irrational   Suicidal thinking can consume a person   Suicide risk increases when a person who has been severely depressed suddenly has more energy   Most common ways of suicide - pills, guns, poisons, hanging, carbon monoxide breathing, jumping off high places, and accidents    How Can I Help When Someone is Threatening Suicide?   Take Action!    1. Take his/her words seriously and respond with compassion    2. Do not leave them alone    3. Call 911 and have person taken to emergency room    4. Accompany the person to the emergency room and provide the physician with information

## 2014-02-18 NOTE — Unmapped (Signed)
I was the attending physician on call, available to resident for supervision. While I did not see this patient in person or discuss with the resident, I am willing to cosign this note as reasonable care.     Imo Cumbie, DO

## 2014-02-18 NOTE — Unmapped (Signed)
Patient remains in lobby with friend.

## 2014-02-18 NOTE — Unmapped (Signed)
[facility] Psychiatric Emergency  Service Evaluation         Reason for Visit/Chief Complaint: Anxiety      Patient History     Context: stress and nonadherence with meds  Location: Altered mental status of mood  Duration: acute on chronic  Severity: moderate .  Associated Symptoms: moderate .  Modifying Factors: medication noncompliance .    Past Psychiatric History:   Diagnosis: Bipolar disorder, II  (2011) - mostly depression and hypomania. When at [facility] center they diagnosed her with anixety and told her she was not BAD.  Admission: 09/2013 at [facility] for severe anxiety and SI.  [facility] in sept 2014 for SI  Outpatient:  From 2011 until October 2014 saw Dr. Marnette Burgess in [place] every month and therapy once week at professional psychiatry services  In [place], [place] until she lost her insurance when turning 27.  Medications: celexa, seroquel (allergic reaction?), lamictal (increase SI),     HPI  Patient is a 28 y/o female with hx of Bipolar disorder sent from [facility] for psych evaluation. Patient not on a hold. She is accompanied by a friend. She is requesting help to manage worsening anxiety since her grandmother passed away on 2014-02-15.including inability to leave her house, does not wants to be alone, afraid about something bad happening to her. She has been trying to get connected with a psychiatrist and is scheduled to start services at an [facility] [place] in three weeks.  she has Medicaid, managed by [facility].   During interview patient reports dealing with anxiety and depression. She has not been able to go to work because she is afraid to leave her house.   She is currently on celexa 20 mg and finds it helpful but feels is no longer working the same. She has been on this medicaiton since 2012.  Denies SI. She is contracting for safety at this time. Reports feeling suicidal a couple of days ago in the context of not finding a provider within her insurance plan but was able to calm  down.  She reports anhedonia, decrease energy, feelings of worthlessness and hopelessness related to not being able to get outpatient follow up.          Social History:  Patient lives with parents in [place], [place] close to [address] Works 4 hours/ week. Has lymphedema and can't be standing for a long time and this makes it difficult to find a job.   Finished HS. Went to [employer] for college.Has a semester and a half to finish college but had to quit after a break down in 2011. Would not go to class or leave her apartment. Describes periods of derealization.     Mom's borther with schizophrenia.Paternal grandfather with bipolar disorder.        Family History   Problem Relation Age of Onset   ??? Hypertension Mother    ??? Diabetes Father    ??? Hypertension Father    ??? Mental illness Maternal Uncle      Schizophrenia   ??? Mental illness Paternal Grandfather      Schizophrenia   ??? Deep vein thrombosis Sister    ??? Pulmonary embolism Sister        Work History:  unemployed    History     Social History   ??? Marital Status: Single     Spouse Name: N/A     Number of Children: N/A   ??? Years of Education:  N/A     Social History Main Topics   ??? Smoking status: Never Smoker    ??? Smokeless tobacco: Never Used   ??? Alcohol Use: Yes      Comment:  social drinker with last drink 3 weeks ago   ??? Drug Use: No   ??? Sexual Activity: Not Currently     Other Topics Concern   ??? Caffeine Use Yes   ??? Occupational Exposure No   ??? Exercise Yes   ??? Seat Belt Yes     Social History Narrative   ??? None          Past Medical History   Diagnosis Date   ??? Anxiety    ??? Bipolar affective disorder    ??? Hypertension    ??? PCOS (polycystic ovarian syndrome)    ??? Morbid obesity    ??? UTI (lower urinary tract infection) 01/20/2012   ??? 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 Acute bronchitis 03/02/2012   ??? Back pain 05/04/2012   ??? Fatigue 07/15/2011   ??? Drug-induced acute pancreatitis 05/26/2012     Victoza   ??? Carpal tunnel syndrome, left 12/16/2012    ??? Herpes zoster 12/13/2012   ??? Chronic acquired lymphedema    ??? PONV (postoperative nausea and vomiting)    ??? Asthma      as a child, no issue since age 12       Previous Medications    BUTALBITAL-ACETAMINOPHEN-CAFFEINE (FIORICET, ESGIC) 50-325-40 MG PER TABLET    Take 1 tablet by mouth every 6 hours as needed for Pain.    CITALOPRAM (CELEXA) 20 MG TABLET    Take 20 mg by mouth daily.    DOXYCYCLINE (VIBRAMYCIN) 100 MG CAPSULE    Take 100 mg by mouth 2 times a day.    FAMOTIDINE (PEPCID) 40 MG TABLET    Take 40 mg by mouth 2 times a day.    FEXOFENADINE-PSEUDOEPHEDRINE (ALLEGRA-D 24 HOUR) 180-240 MG PER 24 HR TABLET    Take 1 tablet by mouth 3 times a day.    HYDROCODONE-ACETAMINOPHEN (NORCO) 5-325 MG PER TABLET    Take 1 tablet by mouth every 6 hours as needed for Pain.    IBUPROFEN (ADVIL,MOTRIN) 800 MG TABLET    Take 1 tablet (800 mg total) by mouth every 8 hours as needed.    LISINOPRIL (PRINIVIL,ZESTRIL) 10 MG TABLET    Take 1 tablet (10 mg total) by mouth daily.    LORAZEPAM (ATIVAN) 1 MG TABLET    Take 1 mg by mouth every 6 hours as needed for Anxiety.       MECLIZINE (ANTIVERT) 12.5 MG TABLET    Take 1 tablet (12.5 mg total) by mouth 3 times a day as needed.    NEOMYCIN-POLYMYXIN-DEXAMETHASONE (MAXITROL) 3.5MG /ML-10,000 UNIT/ML-0.1 % OPHTHALMIC SUSPENSION    1 drop 4 times a day.    PENICILLIN G BENZATHINE (BICILLIN L-A) 2,400,000 UNIT/4 ML SYRG    Inject into the muscle. 2.4 Million Units by Intramuscular route every 21 days.    PENICILLIN G PROCAINE-PENICILLIN G BENZATHINE (BICILLIN C-R) 1,200,000 UNIT/ 2 ML(600K/600K) INJECTION    Inject into the muscle. 1.2 Million Units by Intramuscular route once. Every 3 weeks    TOPIRAMATE (TOPAMAX) 100 MG TABLET    Take 100 mg by mouth daily.        Allergies:   Allergies as of 02/18/2014 - Fully Reviewed 02/18/2014   Allergen Reaction Noted   ??? Clindamycin  01/04/2013   ???  Prednisone  02/18/2014       Review of Systems     Review of Systems      Physical  Exam/Objective Data     ED Triage Vitals   Vital Signs Group      Temp 02/18/14 0653 98.9 ??F (37.2 ??C)      Temp Source 02/18/14 0653 Oral      Heart Rate 02/18/14 0653 74      Heart Rate Source 02/18/14 0653 Automatic      Resp 02/18/14 0653 18      SpO2 02/18/14 0653 99 %      BP 02/18/14 0653 140/84 mmHg      BP Location 02/18/14 0653 Left arm      BP Method 02/18/14 0653 Automatic      Patient Position 02/18/14 0653 Sitting   SpO2 02/18/14 0653 99 %   O2 Device 02/18/14 0653 None (Room air)       Physical Exam    Mental Status Exam:     Gait and Muscle Strength:  Normal and Muscle strength intact  Appearance and Behavior: Calm and Cooperative      Groomed  Speech: NL articulation, prosody, volume and production  Mood: depressed  Affect: labile  Thought Process and Associations: goal directed and no derailment       No loose associations  Thought Content: no suicidal/homicidal without plan and contracting for safety  Perceptual Abnormalities: None  Orientation: person, place, time/date, situation, day of week, month of year and year  Memory/Attention: recent, remote, and immediate recall intact  Abstraction: Abstraction normal  Fund of Knowledge: average  Insight and Judgement: Partial     Fair      Labs:    Please see electronic medical record for any tests performed in the ED.      Radiology:    Please see electronic medical record for any studies performed in the ED.    Emergency Course and Plan     Sahian Kerney is a 28 y.o. female who presented to the emergency department with Anxiety  Patient self referred to [facility] accompanied by a female friend with worsening anxiety and depression in the context of her grandmother death. She is currently with a different insurance since turning 27 and has not been able to find a psychiatry in the community to follow up with her meds and this has become an increase source of anxiety for her. She is currently denying SI/HI and is future oriented.  She does not want to increase  her celexa as she remembers getting increase anxiety on the time her past psychiatrist tried to increase it. She agreed to try buspirone 5 mg BID for 4 days and then titrate up to 5/10 mg and then 10/10 in order to help with anxiety.  No SI, HI, AH, VH. No access to fire-arms. Has safety plan including but not limited to plan to come to the [facility] if feeling suicidal. Collateral provided by female friend who agrees with plan to discharge patient home.    Diagnosis:    Axis I: Major Depression, Rec    Axis II: Deferred     Axis III: morbid obesity, PCOS, chronic acquired lymphedema, asthma    Axis IV: Occupational problems, Problems accessing health care services, Other psychosocial/environmental problems and Overall impression of stressors is  moderate    Axis V: 51-60 moderate symptoms      Disposition:      Discharged from the ED. See AVS  for prescriptions, followup, and discharge instructions.     @SWRPT @        Jerene Pitch, MD  Resident  02/18/14 [phone]

## 2014-02-18 NOTE — Unmapped (Signed)
Patient given discharge instructions and left with her friend.

## 2014-02-18 NOTE — Unmapped (Signed)
[facility] ED Note    Date of service:  02/18/2014    Reason for Visit: Suicidal and Anxiety      Patient History     HPI  27yo woman w/ pmh PCOS, lymphadema, and recurrent cellulitis who p/w increased anxiety for the last few weeks and passive suicidal ideation. States given home stressors with her family she feels like she wouldn't be sad if I died and is scared to leave the house. She has thought about cutting herself and swerving her car into traffic but isn't currently wanting to kill herself. No homicidal ideation or hallucinations. Has long history of anxiety with one hospitalization. Been on celexa which helps her depression but has not been taking it consistently. Complains of typical symptoms for her anxiety including superficial chest pain, headache, itching, and diarrhea. No fevers, chills.    Also complaining of itching behind left thigh which is a spot of chronic itching, no signs of infection.     Past Medical History   Diagnosis Date   ??? Anxiety    ??? Bipolar affective disorder    ??? Hypertension    ??? PCOS (polycystic ovarian syndrome)    ??? Morbid obesity    ??? UTI (lower urinary tract infection) 01/20/2012   ??? 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 Acute bronchitis 03/02/2012   ??? Back pain 05/04/2012   ??? Fatigue 07/15/2011   ??? Drug-induced acute pancreatitis 05/26/2012     Victoza   ??? Carpal tunnel syndrome, left 12/16/2012   ??? Herpes zoster 12/13/2012   ??? Chronic acquired lymphedema    ??? PONV (postoperative nausea and vomiting)    ??? Asthma      as a child, no issue since age 31       Past Surgical History   Procedure Laterality Date   ??? Cholecystectomy     ??? Knee cartilage surgery         Patient  reports that she has never smoked. She has never used smokeless tobacco. She reports that she drinks alcohol. She reports that she does not use illicit drugs.      Previous Medications    BUTALBITAL-ACETAMINOPHEN-CAFFEINE (FIORICET, ESGIC) 50-325-40 MG PER  TABLET    Take 1 tablet by mouth every 6 hours as needed for Pain.    CITALOPRAM (CELEXA) 20 MG TABLET    Take 20 mg by mouth daily.    DOXYCYCLINE (VIBRAMYCIN) 100 MG CAPSULE    Take 100 mg by mouth 2 times a day.    HYDROCODONE-ACETAMINOPHEN (NORCO) 5-325 MG PER TABLET    Take 1 tablet by mouth every 6 hours as needed for Pain.    IBUPROFEN (ADVIL,MOTRIN) 800 MG TABLET    Take 1 tablet (800 mg total) by mouth every 8 hours as needed.    LISINOPRIL (PRINIVIL,ZESTRIL) 10 MG TABLET    Take 1 tablet (10 mg total) by mouth daily.    LORAZEPAM (ATIVAN) 1 MG TABLET    Take 1 mg by mouth 3 times a day as needed for Anxiety.    MECLIZINE (ANTIVERT) 12.5 MG TABLET    Take 1 tablet (12.5 mg total) by mouth 3 times a day as needed.    NEOMYCIN-POLYMYXIN-DEXAMETHASONE (MAXITROL) 3.5MG /ML-10,000 UNIT/ML-0.1 % OPHTHALMIC SUSPENSION    1 drop 4 times a day.    PENICILLIN G BENZATHINE (BICILLIN L-A) 2,400,000 UNIT/4 ML SYRG    Inject into the muscle. 2.4 Million Units by Intramuscular route every 21 days.  PENICILLIN G PROCAINE-PENICILLIN G BENZATHINE (BICILLIN C-R) 1,200,000 UNIT/ 2 ML(600K/600K) INJECTION    Inject into the muscle. 1.2 Million Units by Intramuscular route once. Every 3 weeks    TOPIRAMATE (TOPAMAX) 100 MG TABLET    Take 100 mg by mouth daily.        Allergies:   Allergies as of 02/18/2014 - Fully Reviewed 02/18/2014   Allergen Reaction Noted   ??? Clindamycin  01/04/2013   ??? Prednisone  02/18/2014       Review of Systems     Review of Systems    A full 10 system review of systems was performed and was otherwise negative except as mentioned above.     Physical Exam     ED Triage Vitals   Vital Signs Group      Temp --       Temp src --       Pulse --       Heart Rate Source --       Resp --       SpO2 --       BP --       BP Location --       BP Method --       Patient Position --    SpO2 --    O2 Device --        Physical Exam   Vitals reviewed.  Constitutional: She appears well-developed.   obese   HENT:      Head: Normocephalic and atraumatic.   Eyes: Conjunctivae and EOM are normal.   Neck: Normal range of motion.   Cardiovascular: Normal rate, regular rhythm and normal heart sounds.    Distant heart sounds given habitus   Pulmonary/Chest: Effort normal and breath sounds normal. She has no wheezes.   Limited lung exam given habitus   Abdominal: Soft. She exhibits no distension. There is no tenderness. There is no rebound and no guarding.   Neurological: She is oriented to person, place, time and situation.  Normal speech without aphasia or dysarthria.  Moves all extremities spontaneosly and symmetrically.    Skin: Skin is warm and dry.   Multiple areas of excoriation from chronic itching.         Diagnostic Studies     Labs:    No tests were performed during this ED visit    Radiology:    No tests were performed during this ED visit    EKG:    No EKG Performed    Emergency Department Procedures     Procedures    ED Course and MDM     Sarah [name] is a 28 y.o. female who presented to the emergency department with Suicidal and Anxiety    0.5mg  ativan for acute anxiety.  Tylenol for headache  zofran for nausea    Seen by psych social work who feels she is safe to go to [employer] on her own. Also believes patient has not adequately pursued outpatient follow-up which has been offered in the past.     Patient expressed frustration at being passed from provider to provider but expressed understanding of need for continued follow-up.    Safe for discharge.       Critical Care Time (Attendings)           Champ Mungo, MD  Resident  02/18/14 [phone]

## 2014-02-18 NOTE — Unmapped (Signed)
Patient in lobby with friend, smiling and social.

## 2014-02-18 NOTE — Unmapped (Signed)
PSW at bedside

## 2014-03-21 ENCOUNTER — Encounter: Attending: Family Medicine | Primary: Family Medicine

## 2014-03-30 DIAGNOSIS — E785 Hyperlipidemia, unspecified: Secondary | ICD-10-CM | POA: Insufficient documentation

## 2014-03-31 ENCOUNTER — Inpatient Hospital Stay: Attending: Family Medicine | Primary: Family Medicine

## 2014-03-31 ENCOUNTER — Encounter

## 2014-03-31 ENCOUNTER — Ambulatory Visit
Admit: 2014-03-31 | Discharge: 2014-03-31 | Payer: PRIVATE HEALTH INSURANCE | Attending: Family Medicine | Primary: Family Medicine

## 2014-03-31 ENCOUNTER — Encounter: Admit: 2014-03-31 | Primary: Family Medicine

## 2014-03-31 DIAGNOSIS — M533 Sacrococcygeal disorders, not elsewhere classified: Secondary | ICD-10-CM | POA: Insufficient documentation

## 2014-03-31 DIAGNOSIS — G8929 Other chronic pain: Secondary | ICD-10-CM

## 2014-03-31 DIAGNOSIS — I10 Essential (primary) hypertension: Secondary | ICD-10-CM

## 2014-03-31 MED ORDER — FLUCONAZOLE 150 MG PO TABS
150 MG | ORAL_TABLET | Freq: Once | ORAL | Status: AC
Start: 2014-03-31 — End: 2014-03-31

## 2014-04-01 DIAGNOSIS — I890 Lymphedema, not elsewhere classified: Secondary | ICD-10-CM | POA: Insufficient documentation

## 2014-04-01 LAB — COMPREHENSIVE METABOLIC PANEL
ALT: 46 U/L — ABNORMAL HIGH (ref 10–40)
AST: 27 U/L (ref 15–37)
Albumin/Globulin Ratio: 1.7 (ref 1.1–2.2)
Albumin: 4.5 g/dL (ref 3.4–5.0)
Alkaline Phosphatase: 89 U/L (ref 40–129)
Anion Gap: 15 (ref 3–16)
BUN: 12 mg/dL (ref 7–20)
CO2: 23 mmol/L (ref 21–32)
Calcium: 9.2 mg/dL (ref 8.3–10.6)
Chloride: 101 mmol/L (ref 99–110)
Creatinine: 0.6 mg/dL (ref 0.6–1.1)
GFR African American: >60 (ref >60)
GFR Non-African American: >60 (ref >60)
Globulin: 2.7 g/dL
Glucose: 85 mg/dL (ref 70–99)
Potassium: 4.4 mmol/L (ref 3.5–5.1)
Sodium: 139 mmol/L (ref 136–145)
Total Bilirubin: 0.9 mg/dL (ref 0.0–1.0)
Total Protein: 7.2 g/dL (ref 6.4–8.2)

## 2014-04-01 LAB — CBC WITH AUTO DIFFERENTIAL
Basophils %: 0.9 %
Basophils Absolute: 0.1 10*3/uL (ref 0.0–0.2)
Eosinophils %: 1.3 %
Eosinophils Absolute: 0.1 10*3/uL (ref 0.0–0.6)
Hematocrit: 40.1 % (ref 36.0–48.0)
Hemoglobin: 13.4 g/dL (ref 12.0–16.0)
Lymphocytes %: 25.1 %
Lymphocytes Absolute: 2.8 10*3/uL (ref 1.0–5.1)
MCH: 28.1 pg (ref 26.0–34.0)
MCHC: 33.4 g/dL (ref 31.0–36.0)
MCV: 84.1 fL (ref 80.0–100.0)
MPV: 9.3 fL (ref 5.0–10.5)
Monocytes %: 6.7 %
Monocytes Absolute: 0.8 10*3/uL (ref 0.0–1.3)
Neutrophils %: 66 %
Neutrophils Absolute: 7.4 10*3/uL (ref 1.7–7.7)
Platelets: 356 10*3/uL (ref 135–450)
RBC: 4.77 M/uL (ref 4.00–5.20)
RDW: 15.4 % (ref 12.4–15.4)
WBC: 11.2 10*3/uL — ABNORMAL HIGH (ref 4.0–11.0)

## 2014-04-01 LAB — HEMOGLOBIN A1C
Hemoglobin A1C: 5.4 %
eAG: 108.3 mg/dL

## 2014-04-01 LAB — LIPID PANEL
Cholesterol, Total: 161 mg/dL (ref 0–199)
HDL: 33 mg/dL — ABNORMAL LOW (ref 40–60)
LDL Calculated: 79 mg/dL (ref <100)
Triglycerides: 243 mg/dL — ABNORMAL HIGH (ref 0–150)
VLDL Cholesterol Calculated: 49 mg/dL

## 2014-04-01 LAB — MICROALBUMIN / CREATININE URINE RATIO
Creatinine, Ur: 160.3 mg/dL (ref 28.0–259.0)
Microalbumin Creatinine Ratio: 10.6 mg/g (ref 0.0–30.0)
Microalbumin, Random Urine: 1.7 mg/dL (ref <2.0)

## 2014-04-01 LAB — TSH: TSH: 1.79 u[IU]/mL (ref 0.27–4.20)

## 2014-04-01 NOTE — Progress Notes (Signed)
Subjective:      Patient ID: Angelica Jones is a 28 y.o. female.    HPI Comments: Here to become established      Hypertension  This is a chronic problem. The current episode started more than 1 year ago. The problem is unchanged. The problem is controlled. Associated symptoms include anxiety, headaches, malaise/fatigue and sweats. Pertinent negatives include no blurred vision, chest pain, neck pain, orthopnea, palpitations, peripheral edema, PND or shortness of breath. There are no associated agents to hypertension. Risk factors for coronary artery disease include obesity. Past treatments include ACE inhibitors. The current treatment provides significant improvement. There are no compliance problems.  There is no history of CAD/MI, CVA, heart failure, left ventricular hypertrophy or a thyroid problem. There is no history of sleep apnea.   Other  This is a chronic (PCOs) problem. The current episode started more than 1 year ago. The problem occurs constantly. The problem has been unchanged. Associated symptoms include headaches. Pertinent negatives include no abdominal pain, anorexia, arthralgias, change in bowel habit, chest pain, chills, congestion, coughing, diaphoresis, fatigue, fever, joint swelling, myalgias, nausea, neck pain, numbness, rash, sore throat, swollen glands, urinary symptoms, vertigo, visual change, vomiting or weakness. Nothing aggravates the symptoms. She has tried nothing (intolernat to multiple medications including metformin/bc ) for the symptoms. The treatment provided no relief.   Back Pain  This is a recurrent problem. The current episode started more than 1 year ago. The problem occurs constantly. The problem has been waxing and waning since onset. The pain is present in the lumbar spine and thoracic spine. The quality of the pain is described as aching. The pain does not radiate. The pain is moderate. The pain is the same all the time. The symptoms are aggravated by bending, sitting and  twisting. Stiffness is present all day. Associated symptoms include headaches. Pertinent negatives include no abdominal pain, bladder incontinence, bowel incontinence, chest pain, dysuria, fever, leg pain, numbness, pelvic pain, perianal numbness, tingling, weakness or weight loss. Risk factors include obesity. She has tried analgesics for the symptoms. The treatment provided mild relief.   Mental Health Problem  The primary symptoms do not include dysphoric mood, delusions, hallucinations, bizarre behavior, disorganized speech, negative symptoms or somatic symptoms. This is a chronic problem.   The onset of the illness is precipitated by emotional stress. The degree of incapacity that she is experiencing as a consequence of her illness is moderate. Sequelae: none. Additional symptoms of the illness include insomnia, unexpected weight change, attention impairment and headaches. Additional symptoms of the illness do not include no anhedonia, no hypersomnia, no appetite change, no fatigue, no psychomotor retardation, no feelings of worthlessness, no euphoric mood, no increased goal-directed activity, no flight of ideas, no inflated self-esteem, no decreased need for sleep, not distractible, no poor judgment, no visual change, no abdominal pain or no seizures. She does not admit to suicidal ideas. She does not have a plan to commit suicide. She does not contemplate harming herself. She has not already injured self. She does not contemplate injuring another person. She has not already  injured another person. Risk factors that are present for mental illness include a history of mental illness.     Edema  Patient complains of edema in both ankles and feet and both lower legs. The edema has been severe. Onset of symptoms was several months ago, and patient reports symptoms have stabilized since that time. The edema is present all day. The patient states the problem is  long-standing. The swelling has been aggravated by  dependency of involved area. The swelling has been relieved by diuretics, support stockings, elevation of involved area, low-salt diet. Associated factors include: nothing. Cardiac risk factors include hypertension, obesity (BMI >= 30 kg/m2) and sedentary lifestyle.      Review of Systems   Constitutional: Positive for malaise/fatigue and unexpected weight change. Negative for fever, chills, weight loss, diaphoresis, activity change, appetite change and fatigue.   HENT: Negative for congestion, postnasal drip, rhinorrhea, sore throat and trouble swallowing.    Eyes: Negative for blurred vision, redness and itching.   Respiratory: Negative for cough, chest tightness, shortness of breath and wheezing.    Cardiovascular: Negative for chest pain, palpitations, orthopnea and PND.   Gastrointestinal: Negative for nausea, vomiting, abdominal pain, anorexia, change in bowel habit and bowel incontinence.   Genitourinary: Negative for bladder incontinence, dysuria, urgency and pelvic pain.   Musculoskeletal: Positive for back pain. Negative for myalgias, joint swelling, arthralgias and neck pain.   Skin: Negative for rash.   Neurological: Positive for headaches. Negative for vertigo, tingling, seizures, weakness, light-headedness and numbness.   Hematological: Negative for adenopathy.   Psychiatric/Behavioral: Negative for hallucinations and dysphoric mood. The patient has insomnia. The patient is not nervous/anxious.        is allergic to clindamycin/lincomycin; metformin and related; prednisone; and victoza.    Current outpatient prescriptions:lisinopril (PRINIVIL;ZESTRIL) 10 MG tablet, Take 10 mg by mouth daily, Disp: , Rfl: ;  citalopram (CELEXA) 20 MG tablet, Take 20 mg by mouth daily, Disp: , Rfl: ;  famotidine (PEPCID) 40 MG tablet, Take 40 mg by mouth 2 times daily, Disp: , Rfl: ;  Fexofenadine-Pseudoephedrine (ALLEGRA-D 24 HOUR PO), Take by mouth 3 times daily, Disp: , Rfl:   doxycycline (VIBRAMYCIN) 100 MG capsule,  Take 100 mg by mouth 2 times daily, Disp: , Rfl: ;  Penicillin G Benzathine & Proc (BICILLIN C-R IM), Inject into the muscle, Disp: , Rfl: ;  LORazepam (ATIVAN) 1 MG tablet, Take 1 mg by mouth as needed for Anxiety, Disp: , Rfl: ;  neomycin-polymyxin-dexameth 3.5-10000-0.1 OINT, Place into both eyes daily, Disp: , Rfl:      has a past medical history of Hypertension; Anxiety; Depression; Bipolar disorder (HCC); Polycystic ovarian disease; Cellulitis; Lymphedema; Chronic back pain; Urticaria; Pancreatitis; Hiatal hernia; and Blepharitis of both eyes.    Past Surgical History   Procedure Laterality Date   ??? Cholecystectomy     ??? Carpal tunnel release Left    ??? Knee surgery Right         reports that she has never smoked. She does not have any smokeless tobacco history on file. She reports that she drinks alcohol. She reports that she does not use illicit drugs.    family history includes Diabetes in her father; High Blood Pressure in her father and mother; Kidney Disease in her father; Seizures in her mother.      Objective:   Physical Exam   Constitutional: She is oriented to person, place, and time. She appears well-developed and well-nourished. No distress.   Morbidly obese WF     HENT:   Head: Normocephalic and atraumatic.   Right Ear: External ear normal.   Left Ear: External ear normal.   Nose: Nose normal.   Mouth/Throat: Oropharynx is clear and moist. No oropharyngeal exudate.   Mallampati IV   Eyes: Conjunctivae and EOM are normal. Pupils are equal, round, and reactive to light. Right eye exhibits no discharge. Left  eye exhibits no discharge. No scleral icterus.   Neck: Normal range of motion. Neck supple. No JVD present. No tracheal deviation present. No thyromegaly present.   Cardiovascular: Normal rate, regular rhythm, normal heart sounds and intact distal pulses.  Exam reveals no gallop and no friction rub.    No murmur heard.  3+ non pitting edema both legs  Mild ttp     Pulmonary/Chest: Effort normal  and breath sounds normal. No respiratory distress. She has no wheezes. She has no rales. She exhibits no tenderness.   Abdominal: Soft. Bowel sounds are normal. She exhibits no distension and no mass. There is no tenderness. There is no rebound and no guarding.   Musculoskeletal: Normal range of motion. She exhibits no edema or tenderness.   Lymphadenopathy:     She has no cervical adenopathy.   Neurological: She is alert and oriented to person, place, and time. No cranial nerve deficit. She exhibits normal muscle tone. Coordination normal.   Skin: Skin is warm. No rash noted. She is not diaphoretic. No erythema. No pallor.   Psychiatric: Her behavior is normal. Judgment and thought content normal. Her mood appears anxious. Her affect is not angry, not blunt, not labile and not inappropriate. Her speech is not rapid and/or pressured, not delayed, not tangential and not slurred. She is not agitated, not aggressive, not hyperactive, not slowed, not withdrawn, not actively hallucinating and not combative. Thought content is not paranoid and not delusional. Cognition and memory are normal. Cognition and memory are not impaired. She does not express impulsivity. She does not exhibit a depressed mood. She expresses no homicidal and no suicidal ideation. She expresses no suicidal plans and no homicidal plans. She is communicative. She exhibits normal recent memory and normal remote memory. She is attentive.   Nursing note and vitals reviewed.      Assessment:      1. Benign essential HTN  Comprehensive Metabolic Panel    Lipid Panel    CBC with Differential    TSH without Reflex    Microalbumin / Creatinine Urine Ratio   2. PCO (polycystic ovaries)  HEMOGLOBIN A1C   3. Chronic back pain  [facility]    CANCELED: XR Lumbar Spine Limited    CANCELED: XR SacroilIAC Joints PA and Oblique   4. Sacroiliac dysfunction  [facility]    CANCELED: XR SacroilIAC Joints PA and Oblique   5.  Depression with anxiety     6. Lymphedema     7. Morbid obesity with BMI of 50.0-59.9, adult (HCC)             Plan:      New pt   1. Well controlled  Continue same medications no changes needed at this time   Med rec reviewed today  Get baseline labs    2. Get a1c  Pt is in waiting approval from insurance for gastric bypass sx  For now continue to monitor  Diet/life style discussed at length     3. Referred to PT  Get XR's   Needs wt loss    4. On exam   Get xr  nsaid only prn     5.Counseling: encourage to adjust caloric intake to maintain or achieve ideal body weight, to emphasize fruits, vegetables, whole grains and fat-free/low -fat milk and mild products: includes lean meats, poultry, fish, beans, eggs and nuts. Exercise is a life-long commitment.       [name] received counseling  on the following healthy behaviors: nutrition, exercise and medication adherence    Patient given educational materials on Nutrition, Exercise and Hypertension    I have instructed [name] to complete a self tracking handout on Blood Pressures  and Weights and instructed them to bring it with them to her next appointment.     Discussed use, benefit, and side effects of prescribed medications.  Barriers to medication compliance addressed.  All patient questions answered.  Pt voiced understanding.        Fu 3 months

## 2014-04-03 NOTE — Telephone Encounter (Signed)
Please advise

## 2014-04-03 NOTE — Telephone Encounter (Signed)
Pt was calling to get the results of her blood work  Please call and advise

## 2014-04-04 NOTE — Telephone Encounter (Signed)
See lab note

## 2014-04-05 ENCOUNTER — Inpatient Hospital Stay: Admit: 2014-04-05 | Attending: Rehabilitative and Restorative Service Providers" | Primary: Family Medicine

## 2014-04-05 NOTE — Telephone Encounter (Signed)
error 
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
## 2014-04-06 NOTE — Telephone Encounter (Signed)
Pt request call back regarding lab results [facility] also she wants to be advised on antiinflammatory medications because otc medications are not helping her please call

## 2014-04-06 NOTE — Telephone Encounter (Signed)
Please refer to lab results message. Closing encounter

## 2014-04-10 ENCOUNTER — Inpatient Hospital Stay: Admit: 2014-04-10 | Attending: Rehabilitative and Restorative Service Providers" | Primary: Family Medicine
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
## 2014-04-11 MED ORDER — TRAMADOL HCL 50 MG PO TABS
50 MG | ORAL_TABLET | Freq: Two times a day (BID) | ORAL | Status: AC
Start: 2014-04-11 — End: 2015-04-11

## 2014-04-11 NOTE — Telephone Encounter (Signed)
Pt was given her blood results  She states she is not allergic to trammadol  She is still using the pharmacy on file  If there are any questions please call if no answer leave msg on vm

## 2014-04-11 NOTE — Telephone Encounter (Signed)
Prescription was called in  Pt was informed and expressed understanding. Message completed

## 2014-04-12 ENCOUNTER — Encounter: Attending: Rehabilitative and Restorative Service Providers" | Primary: Family Medicine

## 2014-04-17 ENCOUNTER — Inpatient Hospital Stay: Attending: Rehabilitative and Restorative Service Providers" | Primary: Family Medicine

## 2014-04-17 NOTE — Other (Signed)
Physical Therapy  Cancellation/No-show Note  Patient Name:  Angelica ReinLisa Jones  DOB:  09/09/1985   Date:  04/17/2014  Cancelled visits to date: 0  No-shows to date: 01    For today's appointment patient:  []   Cancelled  []   Rescheduled appointment  [x]   No-show     Reason given by patient:  []   Patient ill  []   Conflicting appointment  []   No transportation    []   Conflict with work  []   No reason given  []   Other:     Comments:      Electronically signed by:  Andree CossJILL Mete Purdum, PT

## 2014-04-19 ENCOUNTER — Inpatient Hospital Stay: Admit: 2014-04-19 | Attending: Rehabilitative and Restorative Service Providers" | Primary: Family Medicine
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
## 2014-04-28 ENCOUNTER — Inpatient Hospital Stay
Admit: 2014-04-28 | Discharge: 2014-04-28 | Disposition: A | Discharge status: Home / Self Care | Payer: Medicaid (Managed Care)

## 2014-04-28 DIAGNOSIS — L509 Urticaria, unspecified: Secondary | ICD-10-CM

## 2014-04-28 MED ORDER — famotidine (PF) (PEPCID) 20 mg/2 mL Soln 20 mg
20 | Freq: Once | INTRAVENOUS | Status: AC
Start: 2014-04-28 — End: 2014-04-28
  Administered 2014-04-28: 08:00:00 20 mg via INTRAVENOUS

## 2014-04-28 MED ORDER — cetirizine (ZYRTEC) tablet 10 mg
10 | Freq: Every day | ORAL | Status: AC
Start: 2014-04-28 — End: 2014-04-28
  Administered 2014-04-28: 08:00:00 10 mg via ORAL

## 2014-04-28 MED ORDER — diphenhydrAMINE (BENADRYL) injection 25 mg
50 | Freq: Once | INTRAMUSCULAR | Status: AC
Start: 2014-04-28 — End: 2014-04-28
  Administered 2014-04-28: 08:00:00 25 mg via INTRAVENOUS

## 2014-04-28 MED ORDER — dexamethasone (PF) (DECADRON) 10 mg/mL injection Soln 10 mg
10 | Freq: Once | INTRAMUSCULAR | Status: AC
Start: 2014-04-28 — End: 2014-04-28
  Administered 2014-04-28: 08:00:00 10 mg via INTRAMUSCULAR

## 2014-04-28 MED ORDER — cetirizine (ZYRTEC) 10 MG tablet
10 | ORAL_TABLET | Freq: Every day | ORAL | 2.00 refills | 30.00000 days | Status: AC
Start: 2014-04-28 — End: 2014-05-18

## 2014-04-28 MED ORDER — hydrOXYzine HCl (ATARAX) 50 MG tablet
50 | ORAL_TABLET | Freq: Four times a day (QID) | ORAL | Status: AC | PRN
Start: 2014-04-28 — End: 2014-05-05

## 2014-04-28 MED FILL — CETIRIZINE 10 MG TABLET: 10 10 MG | ORAL | Qty: 1

## 2014-04-28 MED FILL — DIPHENHYDRAMINE 50 MG/ML INJECTION SOLUTION: 50 50 mg/mL | INTRAMUSCULAR | Qty: 1

## 2014-04-28 MED FILL — FAMOTIDINE (PF) 20 MG/2 ML INTRAVENOUS SOLUTION: 20 20 mg/2 mL | INTRAVENOUS | Qty: 2

## 2014-04-28 MED FILL — DEXAMETHASONE SODIUM PHOSPHATE (PF) 10 MG/ML INJECTION SOLUTION: 10 10 mg/mL | INTRAMUSCULAR | Qty: 1

## 2014-04-28 NOTE — Unmapped (Signed)
Pt discharged in good condition, stable, denies needs or questions. No complaints at this time. Ambulated to ED exit without changes.

## 2014-04-28 NOTE — Unmapped (Signed)
Hives  Hives are itchy, red, swollen areas of the skin. They can vary in size and location on your body. Hives can come and go for hours or several days (acute hives) or for several weeks (chronic hives). Hives do not spread from person to person (noncontagious). They may get worse with scratching, exercise, and emotional stress.  CAUSES   ?? Allergic reaction to food, additives, or drugs.  ?? Infections, including the common cold.  ?? Illness, such as vasculitis, lupus, or thyroid disease.  ?? Exposure to sunlight, heat, or cold.  ?? Exercise.  ?? Stress.  ?? Contact with chemicals.  SYMPTOMS   ?? Red or white swollen patches on the skin. The patches may change size, shape, and location quickly and repeatedly.  ?? Itching.  ?? Swelling of the hands, feet, and face. This may occur if hives develop deeper in the skin.  DIAGNOSIS   Your caregiver can usually tell what is wrong by performing a physical exam. Skin or blood tests may also be done to determine the cause of your hives. In some cases, the cause cannot be determined.  TREATMENT   Mild cases usually get better with medicines such as antihistamines. Severe cases may require an emergency epinephrine injection. If the cause of your hives is known, treatment includes avoiding that trigger.   HOME CARE INSTRUCTIONS   ?? Avoid causes that trigger your hives.  ?? Take antihistamines as directed by your caregiver to reduce the severity of your hives. Non-sedating or low-sedating antihistamines are usually recommended. Do not drive while taking an antihistamine.  ?? Take any other medicines prescribed for itching as directed by your caregiver.  ?? Wear loose-fitting clothing.  ?? Keep all follow-up appointments as directed by your caregiver.  SEEK MEDICAL CARE IF:   ?? You have persistent or severe itching that is not relieved with medicine.  ?? You have painful or swollen joints.  SEEK IMMEDIATE MEDICAL CARE IF:   ?? You have a fever.  ?? Your tongue or lips are swollen.  ?? You have  trouble breathing or swallowing.  ?? You feel tightness in the throat or chest.  ?? You have abdominal pain.  These problems may be the first sign of a life-threatening allergic reaction. Call your local emergency services (911 in [place]  MAKE SURE YOU:   ?? Understand these instructions.  ?? Will watch your condition.  ?? Will get help right away if you are not doing well or get worse.  Document Released: 05/12/2005 Document Revised: 05/17/2013 Document Reviewed: 08/05/2011  ExitCare?? Patient Information ??2015 [employer]. This information is not intended to replace advice given to you by your health care provider. Make sure you discuss any questions you have with your health care provider.

## 2014-04-28 NOTE — Unmapped (Signed)
Pt states that she is itching all over  and got hives all over - I am allergic to heat  . Pt states that she has a global reaction to any stimulus that increases her heart rate,  stressed out, working out , anxious, I break out in hives  . Pt states she takes Lisinopril for hypertension. Pt is morbidly obese
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
## 2014-04-28 NOTE — Other (Unsigned)
CURRENT STATUS IS EMERGENCY .      :     Any questions regarding PATIENT CLASS/STATUS should be directed to the Care   Management Departments: [facility] [phone] or [facility] [phone] or [facility]  [phone].         _________________________________  Signed byJamison Neighbor:    [facility]  NOTIFY    ZZ    D: 04/28/2014 03:55 PM  T:    This document is confidential medical information.  Unauthorized disclosure or   use of this information is prohibited by law.  If you are not the intended recipient of this document, please advise us by   calling immediately [phone].

## 2014-04-28 NOTE — Telephone Encounter (Signed)
Pt broke out in hives last night  [facility]  She was given a stroid shot and benadryl   And petcid      She is now having abd pain and cramping    She wants to know what to do now   Can leave a message on voice  mail
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
## 2014-04-28 NOTE — Telephone Encounter (Signed)
It is prob the steroid  Take prilosec instead of pepcid   And tums prn for abd pain and cramping  If sx worsen may need to go back to [facility]

## 2014-04-28 NOTE — Telephone Encounter (Signed)
Pt said that she is going to the ER  Message completed

## 2014-04-30 NOTE — Other (Unsigned)
[name] ED Encounter Arrival Date: 04/28/14 1555   Angelica CarinaLisa M Jones                            DOB: 06/12/1985 [address]   [place] [facility] [id number] MRN: [id number] CSN: [id number]     HAR: [id number]     I have personally seen and examined this patient, with the following   particular findings: Complains of epigastric pain radiating to the back   beginning this morning. Patient has had one episode of vomiting. Patient   states "this feels just like when I had pancreatitis before." On exam there is   minimal tenderness to palpation. Bowel sounds are normal. No rebound or   rigidity.     I have fully participated in the care of this patient, including making all   diagnostic, treatment and disposition decisions. I have reviewed and agree   with all pertinent clinical information including history, physical exam,   labs, radiographic studies and the plan as stated by the [facility]   documentation. I have also reviewed and agree with the medications, allergies   and past medical history sections for this patient.             Arlyss Repressuel, Marc A., DO 04/30/14 0038         _________________________________  Signed by:  Liz BeachMARC A. TUEL  MARC A. TUEL    10    D: 04/30/2014 12:38 AM  T: 04/30/2014 12:38 AM    This document is confidential medical information.  Unauthorized disclosure or   use of this information is prohibited by law.  If you are not the intended recipient of this document, please advise us by   calling immediately [phone].

## 2014-04-30 NOTE — Other (Unsigned)
------------------------------------------------------------------------------  -- Attestation signed by Donald Prose., DO at 04/30/14 [facility] EMERGENCY   DEPARTMENT - ATTENDING NOTE         I have personally seen and examined this patient. I have fully   participated in the care of this patient with the resident/MLP.  I have   reviewed and agree with all pertinent clinical information including history,   physical exam, labs, radiographic studies and the plan. I have also reviewed   and agree with the medications, allergies and past medical history sections   for this patient.     Please see my separately dictated note     Electronically signed by: Marc A. Laurence Slate, DO   ------------------------------------------------------------------------------  --     [facility] ED Encounter Arrival Date: 04/28/14 [place]                            DOB: [birth year] [address]   Lenor Coffin [facility] [address] MRN: [id number] CSN: [id number]     HAR: [id number]     EMERGENCY DEPARTMENT - General NOTE     CHIEF COMPLAINT Chief Complaint Patient presents with   Abdominal Pain     HPI Angelica Jones is a 28 year old female who presents to ER with complaints   of midepigastric abdominal pain times one day, patient states that she woke up   this morning her having midepigastric abdominal pain with some nausea states   he did vomit once approximately an hour ago, patient denies any diarrhea or   constipation patient denies any abdominal pain any worsening abdomen patient   states she was given a steroid shot yesterday following an allergic reaction   which the patient ended up getting a secondary pancreatitis in the past with   and is worried that she is having pancreatitis again, patient denies any chest   pain shortness of breath or upper respiratory signs and symptoms patient   denies any fevers or chills patient denies any urinary symptoms vaginal   discharge. Patient states she did have her  gallbladder removed.     PAST MEDICAL HISTORY Past Medical History Diagnosis Date   PCOS (polycystic   ovarian syndrome)   Vesta Mixer CNP   Hypertension   Bipolar affective   disorder ([place])   Dr. [name]   Cellulitis and abscess of leg   recurrent   cellulitis   Edema   Morbid obesity ([place])   Unspecified essential hypertension     Depressive disorder, not elsewhere classified   Acute pancreatitis   Lymph   edema     SURGICAL HISTORY Past Surgical History Procedure Laterality Date   Carpal   tunnel release Left   Cholecystectomy with/without common duct exploration    2003   [place]   Arthroscopy knee     CURRENT MEDICATIONS Prior to Admission medications Medication Sig Start Date   End Date Taking? Authorizing Provider ketorolac (TORADOL) 10 MG TABS Take 1   tablet by mouth 4 (four) times daily as needed. 04/14/14   Donivan Scull, MD Multiple Vitamin (MULTI-VITAMIN DAILY PO) Take  by mouth daily.      HISTORICAL MED Penicillin G Benzathine & Proc (BICILLIN C-R, 1200000,) 1200000   UNIT/2ML SUSP by Intramuscular route every 21 days.    HISTORICAL MED   doxycycline (DORYX) 100 MG CPEP Take 100 mg by mouth 2 (two) times daily.  HISTORICAL MED fexofenadine (ALLEGRA) 180 MG TABS Take 180 mg by mouth 3   (three) times daily. HISTORICAL MED famotidine (PEPCID) 40 MG TABS Take 1   tablet by mouth 2 (two) times daily. 10/12/13   [name], MD   EPINEPHrine (EPIPEN 2-PAK) 0.3 MG/0.3ML SOAJ Inject 0.3 mg as needed. 10/12/13   [name], MD albuterol 108 (90 BASE) MCG/ACT AERS Use 2 puffs every 4   (four) hours as needed. 10/12/13   [name], MD lisinopril   (PRINIVIL,ZESTRIL) 10 MG TABS Take 1 tablet by mouth daily. 10/10/13 [name], MD citalopram (CELEXA) 20 MG TABS Take 20 mg by mouth daily.      HISTORICAL MED     ALLERGIES Allergies Allergen Reactions   Clindamycin Anaphylaxis   Prednisone   Other (See Comments)   pancreatitis     FAMILY HISTORY Family History Problem Relation Age of  Onset   pulm   embolus[other] [OTHER] Sister   PCOS[other] [OTHER] Sister   Hypertension   Mother   Diabetes Father   Hypertension Father     SOCIAL HISTORY History     Social History   Marital Status: Single   Spouse Name: N/A   Number of   Children: N/A   Years of Education: N/A     Occupational History     Social History Main Topics   Smoking status: Never Smoker   Smokeless   tobacco: Never Used   Alcohol Use: 0.5 oz/week   1 Shots of liquor per week      Comment: socially  1 x month   Drug Use: No   Sexual Activity: Yes   Tourist information centre manager: None     Other Topics Concern   Not on file     Social History Narrative     REVIEW OF SYSTEMS All other review of systems are negative except for history   of present illness     PHYSICAL EXAM VITAL SIGNS: BP 130/59 mmHg   Pulse 89   Temp(Src) 98.2  F   (36.8  C) (Oral)   Resp 16   Ht 67" (170.2 cm)   Wt 350 lb (158.759 kg)   BMI   54.80 kg/m2   SpO2 98%   LMP 10/12/2012 Constitutional:  Well developed, Well   nourished, No acute distress, Non-toxic appearance. HENT:  Normocephalic,   Atraumatic, Bilateral external ears normal, Oropharynx moist, No oral   exudates, Nose normal. Neck- Normal range of motion, No tenderness, Supple, No   stridor. Respiratory:  Normal breath sounds, No respiratory distress, No   wheezing, No chest tenderness. Cardiovascular:  Normal heart rate, Normal   rhythm, No murmurs, No rubs, No gallops. GI:  Bowel sounds normal, Soft, with   epigastric abdominal pain no pain anywhere else in the abdomen on palpation no   rebound tenderness no abdominal distention, No masses, No pulsatile masses.   Musculoskeletal:  Intact distal pulses, No edema, No tenderness, No cyanosis,   No clubbing. Good range of motion in all major joints. No tenderness to   palpation or major deformities noted. Back- No tenderness. Integument:  Warm,   Dry, No erythema, No rash. Neurologic:  Alert & oriented x 3, Normal motor   function, Normal sensory function, No  focal deficits noted. Psychiatric:    Affect normal, Judgment normal, Mood normal.     EKG     LABORATORY Recent Results (from the past 24 hour(s))  CBC with Differential    Collection Time: 04/28/14  4:27 PM Kevyn Wengert Value Ref Range  WBC 13.0 (H) 3.6 -   10.5 THOU/mcL  RBC 4.83 3.80 - 5.20 MIL/mcL  HEMOGLOBIN 13.8 12.0 - 15.2 g/dL    HEMATOCRIT 40 36 - 46 %  MCV 83 82 - 97 fL  MCH 29 27 - 33 pg  MCHC 34 32 -   36 g/dL  RDW 15.5 (H) <15.3 %  PLATELET 420 (H) 140 - 375 THOU/mcL  ABS.   NEUTROPHIL 11.74 (H) 1.80 - 7.70 THOU/mcL  ABS LYMPHS 1.12 1.00 - 4.00   THOU/mcL  ABS MONOS 0.11 (L) 0.20 - 0.90 THOU/mcL  ABS EOS 0.00 (L) 0.03 -   0.45 THOU/mcL  ABS BASOS 0.04 0.01 - 0.07 THOU/mcL  SEGS 90 %  LYMPHOCYTES 9 %    MONOCYTES 1 %  EOSINOPHIL 0 %  BASOPHILS 0 % Hepatic Function Panel   (TBIL,DBIL,ALK,AST,ALT,TP,ALB)  Collection Time: 04/28/14  4:27 PM Anntoinette Haefele   Value Ref Range  ALK PHOSPHATASE 81 35 - 135 IU/L  TOTAL PROTEIN 8.2 (H) 6.0 -   8.0 g/dL  ALBUMIN 4.3 3.5 - 5.0 g/dL  AST 26 10 - 40 IU/L  ALT 37 10 - 60   IU/L  TOTAL BILIRUBIN 0.9 0.0 - 1.2 mg/dL  DIRECT BILIRUBIN 0.2 0.0 - 0.2   mg/dL Lipase  Collection Time: 04/28/14  4:27 PM Tavyn Kurka Value Ref Range    LIPASE 23 22 - 51 U/L POCT Metabolic Panel - ISTAT  Collection Time: 04/28/14    4:34 PM Sheron Robin Value Ref Range  GLUCOSE 133 (H) 74 - 99 mg/dL  BLD UREA   NITROGEN 14 8 - 26 mg/dL  CREATININE 0.7 0.4 - 1.0 mg/dL  SODIUM 141 135 - 145   mEq/L  POTASSIUM 4.1 3.6 - 5.1 mEq/L  CHLORIDE 104 101 - 111 mEq/L  CO2 TOTAL   24 24 - 36 mEq/L  IONIZED CALCIUM 5.0 4.6 - 5.4 mg/dL  HEMOGLOBIN,CALC 13.6   12.0 - 15.2 g/dL  HEMATOCRIT 40 36 - 46 %PCV POCT URINALYSIS  Collection Time:   04/28/14  6:16 PM Adrinne Sze Value Ref Range  COLOR,POC URINE YELLOW YELLOW    CLARITY,POC URINE CLEAR CLEAR  PH,POC URINE 7.0 5.0 - 8.0  SPEC GRAVITY POC UR   1.015 1.005 - 1.029  PROTEIN,POC URINE 1+ (A) NEGATIVE  GLUCOSE,POC URINE   NEGATIVE NEGATIVE  KETONES,POC URINE NEGATIVE NEGATIVE  BILIRUBIN,POC  URINE   NEGATIVE NEGATIVE  BLOOD,POC URINE TRACE (A) NEGATIVE  UROBILINOGEN,POC URINE   0.2 <2.0  NITRITE,POC URINE NEGATIVE NEGATIVE  LEUK ESTERASE,POC URN NEGATIVE   NEGATIVE POCT GONADOT HCG QUALITATIVE  Collection Time: 04/28/14  6:18 PM   Franko Hilliker Value Ref Range  HCG,POC URINE NEGATIVE NEGATIVE     RADIOLOGY No orders to display     PROCEDURES     ED COURSE & MEDICAL DECISION MAKING Pertinent Labs & Imaging studies   reviewed. (See chart for details) Patient was evaluated in the ER by myself   and Dr. Laurence Slate, Patient presents to the ER with complaints of mid epigastric   abdominal pain started this morning, patient stable vital signs in the ER was   afebrile, patient's white count was 13,000 patient stable H&H, patient   negative lipase normal LFTs the patient's UA was negative for urinary tract   infection because it did not show any leukocytes or nitrites, patient had   trace blood patient negative pregnancy test, patient's  symptoms are most   consistent with gastritis, patient is currently taking Pepcid at home   patient's instructed to continue with the Pepcid the patient was given aGI   cocktail liter fluid morphine for pain and Zofran in the ER, patient will be   discharged home with a prescription for Carafate patient's instructed to   progress diet as tolerated the patient will also be given medicine for nausea.   Patient's instructed to followup with primary care physician if problem   persists or worsens patient may return to ER, patient states she understands   above diagnosis and treatment plan all the patient's questions are answered in   the ER patient is discharged home in stable condition.     The patient was given the following medication in the Emergency department:     Medication Administration from 04/28/2014 1555 to 04/28/2014 1842      Date/Time Order Dose Route Action Action by Comments   04/28/2014 1653 sodium   chloride 0.9% infusion 125 mL/hr Intravenous New Bag Dayle Points      04/28/2014 1710 morphine injection 4 mg 4 mg Intravenous Not Given Dayle Points   04/28/2014 1653 ondansetron (ZOFRAN) injection 4 mg 4 mg Intravenous   Given Dayle Points name,dob,allergies verified.   04/28/2014 1755   famotidine (PEPCID) intravenous injection 20 mg 20 mg Intravenous Given   Tiffany  Wells   04/28/2014 1755 GI Cocktail oral solution 45 mL Oral Given   Tiffany  Wells         FINAL DIAGNOSIS:     1. Gastritis         The patient was given the following medications to go home with.     New Prescriptions  ONDANSETRON (ZOFRAN-ODT) 4 MG TBDP    Take 1 tablet by   mouth every 8 (eight) hours as needed.  SUCRALFATE (CARAFATE) 1 G TABS    Take   1 tablet by mouth 4 (four) times daily before meals and nightly.     Marisue Ivan A., DO  spent face to face time with the patient and agrees with   above diagnosis and treatment.     Tessa Lerner PA-C         Tessa Lerner, PA-C 04/28/14 1842     Donald Prose., DO 04/30/14 0038         _________________________________  Signed by:  Baldemar Lenis. [place]    10    D: 04/30/2014 12:38 AM  T: 04/30/2014 12:38 AM    This document is confidential medical information.  Unauthorized disclosure or   use of this information is prohibited by law.  If you are not the intended recipient of this document, please advise [place] by   calling immediately [phone].

## 2014-05-02 ENCOUNTER — Inpatient Hospital Stay: Admit: 2014-05-02 | Attending: Rehabilitative and Restorative Service Providers" | Primary: Family Medicine
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
## 2014-05-05 ENCOUNTER — Encounter: Attending: Rehabilitative and Restorative Service Providers" | Primary: Family Medicine

## 2014-05-08 ENCOUNTER — Inpatient Hospital Stay: Admit: 2014-05-08 | Attending: Rehabilitative and Restorative Service Providers" | Primary: Family Medicine
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
## 2014-05-10 ENCOUNTER — Inpatient Hospital Stay: Admit: 2014-05-10 | Attending: Rehabilitative and Restorative Service Providers" | Primary: Family Medicine
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
## 2014-05-12 ENCOUNTER — Inpatient Hospital Stay: Admit: 2014-05-12 | Discharge: 2014-05-12 | Attending: Emergency Medicine

## 2014-05-12 DIAGNOSIS — L509 Urticaria, unspecified: Secondary | ICD-10-CM

## 2014-05-12 MED ORDER — DIPHENHYDRAMINE HCL 25 MG PO CAPS
25 MG | ORAL_CAPSULE | ORAL | Status: DC | PRN
Start: 2014-05-12 — End: 2015-05-07

## 2014-05-12 MED ORDER — DIPHENHYDRAMINE HCL 50 MG/ML IJ SOLN
50 MG/ML | Freq: Once | INTRAMUSCULAR | Status: DC
Start: 2014-05-12 — End: 2014-05-12

## 2014-05-12 MED ORDER — GI COCKTAIL
Freq: Once | Status: AC
Start: 2014-05-12 — End: 2014-05-12
  Administered 2014-05-12: 21:00:00 30 mL via ORAL

## 2014-05-12 MED ORDER — DIPHENHYDRAMINE HCL 50 MG/ML IJ SOLN
50 MG/ML | Freq: Once | INTRAMUSCULAR | Status: AC
Start: 2014-05-12 — End: 2014-05-12
  Administered 2014-05-12: 21:00:00 25 mg via INTRAMUSCULAR

## 2014-05-12 MED FILL — DIPHENHYDRAMINE HCL 50 MG/ML IJ SOLN: 50 MG/ML | INTRAMUSCULAR | Qty: 1

## 2014-05-12 MED FILL — GI COCKTAIL: Qty: 30

## 2014-05-12 NOTE — ED Notes (Signed)
Patient prepared for and ready to be discharged. Patient discharged at this time in no acute distress after verbalizing understanding of discharge instructions. Patient left after receiving After Visit Summary instructions. Pt to waiting room to waiting room to wait for ride home to arrive.    Angelica KaufmannJack Terri Malerba, RN  05/12/14 [phone]

## 2014-05-12 NOTE — ED Notes (Signed)
Pt states feeling better but lips are tingling.    Josem KaufmannJack Vela Render, RN  05/12/14 1750

## 2014-05-12 NOTE — ED Notes (Signed)
Pt to Rm 21 via triage with c/o hives.  Pt states she has a heat allergy.  Pt states she took 1 benadryl without relief    Laverna PeaceKatherine Chani Ghanem, RN  05/12/14 [phone]

## 2014-05-12 NOTE — ED Provider Notes (Signed)
ED Attending Attestation Note     Date of evaluation: 05/12/2014    This patient was seen by the mid-level provider.  I have seen and examined the patient, agree with the workup, evaluation, management and diagnosis. The care plan has been discussed.  My assessment reveals well-developed female in no acute distress.  Patient with history of local heat and pressure-induced urticaria with worsening symptoms recently.  Patient has been poorly tolerant of steroids in the past.  Advised follow-up with her allergist for adjustment of her medications.    Thomasene Rippleustin P Draden Cottingham, MD  05/12/14 [phone]

## 2014-05-12 NOTE — ED Notes (Signed)
Pt states she has a reaction to heat which produces hives. Pt took benadryl po times one prior to arrival.    Josem KaufmannJack Adriel Desrosier, RN  05/12/14 [phone]

## 2014-05-12 NOTE — Other (Signed)
05/12/14 1650   Encounter Summary   Services provided to: Patient   Referral/Consult From: Rounding   Continue Visiting (seen 12/18, ALE.)   Complexity of Encounter Low   Length of Encounter 15 minutes   Routine   Type Initial   Assessment Calm;Approachable   Intervention Active listening;Nurtured hope;Prayer   Outcome Expressed gratitude   Spiritual/Religious   Type Spiritual support   Assessment Calm;Approachable   Intervention Active listening;Nurtured hope;Prayer   Outcome Expressed gratitude;Engaged in conversation
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
## 2014-05-17 ENCOUNTER — Inpatient Hospital Stay: Admit: 2014-05-17 | Attending: Rehabilitative and Restorative Service Providers" | Primary: Family Medicine
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
## 2014-05-23 ENCOUNTER — Inpatient Hospital Stay: Attending: Rehabilitative and Restorative Service Providers" | Primary: Family Medicine
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
## 2014-05-24 ENCOUNTER — Inpatient Hospital Stay: Admit: 2014-05-24 | Attending: Rehabilitative and Restorative Service Providers" | Primary: Family Medicine
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
## 2014-05-26 HISTORY — PX: GASTRIC BYPASS: SHX52

## 2014-05-29 ENCOUNTER — Telehealth

## 2014-05-29 NOTE — Telephone Encounter (Signed)
Ok   Dx lymphedema

## 2014-05-29 NOTE — Telephone Encounter (Signed)
Pt needs referral for [facility]  Pt will need a phone call when this is done  Can leave message

## 2014-05-30 ENCOUNTER — Inpatient Hospital Stay: Primary: Family Medicine

## 2014-05-30 ENCOUNTER — Inpatient Hospital Stay: Attending: Rehabilitative and Restorative Service Providers" | Primary: Family Medicine

## 2014-05-30 NOTE — Telephone Encounter (Signed)
Referral was placed and faxed over  Left message informing pt  Message completed
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
## 2014-06-01 ENCOUNTER — Inpatient Hospital Stay: Attending: Rehabilitative and Restorative Service Providers" | Primary: Family Medicine

## 2014-06-01 NOTE — Other (Signed)
Physical Therapy  Cancellation/No-show Note  Patient Name:  Angelica Jones  DOB:  04/14/1986   Date:  06/01/2014  Cancelled visits to date: 03  No-shows to date: 01    For today's appointment patient:  [x]   Cancelled  []   Rescheduled appointment  []   No-show     Reason given by patient:  []   Patient ill  [x]   Conflicting appointment -- taking her mom to an appointment   []   No transportation    []   Conflict with work  []   No reason given  []   Other:     Comments:      Electronically signed by:  Andree CossJILL Larcenia Holaday, PT

## 2014-06-08 ENCOUNTER — Inpatient Hospital Stay: Attending: Rehabilitative and Restorative Service Providers" | Primary: Family Medicine

## 2014-06-08 NOTE — Other (Addendum)
Physical Therapy  Cancellation/No-show Note  Patient Name:  Angelica ReinLisa Dortch  DOB:  09/25/1985   Date:  06/08/2014  Cancelled visits to date: 04  No-shows to date: 01    For today's appointment patient:  [x]   Cancelled  []   Rescheduled appointment  []   No-show     Reason given by patient:  []   Patient ill  []   Conflicting appointment  []   No transportation    []   Conflict with work  []   No reason given  [x]   Other:  Overslept    Comments:      Electronically signed by:  Andree CossJILL Mika Griffitts, PT

## 2014-06-08 NOTE — Progress Notes (Signed)
Physical Therapy  Initial Assessment  Date: 06/08/2014  Patient Name: Angelica ReinLisa Jones  MRN: [id number]  DOB: 04/11/1986     Treatment Diagnosis: swelling R >L LE    Restrictions  Position Activity Restriction  Other position/activity restrictions: no falls in the last 30 days; pressure allergy & heat allergy  Outpatient fall risk assessment completed asking screening question if patient has fallen in the past 30 days:  [x]  Yes  []  No    Based on screen for falls, patient demonstrates fall risk:  []  Yes  [x]  No    Interventions based on fall risk status:  Updated Problem List within Medical History  []  Yes   [x]  N/A    Asked family to assist with increased observation of the patient  []  Yes   [x]  N/A    Patient kept in visible area when not closely supervised by therapist  []  Yes   [x]  N/A    Repeatedly reinforce activity limits and safety needs with patient/family  []  Yes   [x]  N/A    Increase frequency of rounding/monitoring patient  []  Yes   [x]  N/A              Subjective   General  Chart Reviewed: Yes  Additional Pertinent Hx: chronic cellulitis -- see Dr. 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  gets penicillin shot 1x every 3 wks to keep cellulitis at bay.  First bout of cellulitis in 2004  Family / Caregiver Present: No  Referring Practitioner: Dr. Daune Perchocio Tussey  Referral Date : 05/30/14  Diagnosis: lymphedema 457.1, I89.0  Follows Commands: Within Functional Limits  General Comment  Comments: knee surgery R LE in March 2015, has lumbar DDD  PT Visit Information  Onset Date: 05/18/11  PT [facility] [place] [facility]% coverage, med Engineer, productionnec for visits  Subjective  Subjective: CLOF: uses "pump pants " every night - at least she tries to - go up to her waist.  has had trouble with R LE since august 2015 -- has noticed increased R LE swelling and that her R LE swelling wont go down all the way.  has had previous lymphedema tx at [facility].  Has not been able to tolerate  compressino stockings all the time  - not even 3 hours - b/c her skin is so sensitive due to all the cellulitis.  has been using the pump pants x 2 years.  stand tol: a few min - then incresaed swelling and pain.  works at [employer] -- is on her feet a lot. -- works part time -- b/c unable to tol being on her feet all day. states she was recently approved for Gastroc Bypass surgery -- has not been scheduled yet - anticipates it will occurin Feb 2016.  belongs to [employer]. has done some aquatic exercise but would rather dance.    PLOF:  able to stand a long time.  has lost /resigned from  jobs due to medical issues related to cellulitis and  lymphedema .  Pain Screening  Patient Currently in Pain: Yes  Pain Assessment  Pain Assessment: 0-10  Pain Level: 7  Pain Type: Chronic pain  Pain Location: Leg  Pain Orientation: Left;Right  Pain Descriptors: Aching;Cramping  Pain Frequency: Continuous  Vital Signs  Patient Currently in Pain: Yes  Vision/Hearing  Vision  Vision: Impaired (glasses)  Hearing  Hearing: Within functional limits     Objective     Observation/Palpation  Posture:  Fair (antalgic gait due to lumbar DDD)  Observation: morbidly obese, mild hemosidron staining, swelling R>L LE below knees  Scar: R ant knees  RLE (degrees)  RLE ROM: Active;WFL  ROM LLE (degrees)  LLE ROM: Active;WFL  Strength RLE  Strength RLE: WFL  Comment: 4/5 t/o limited by LBP  Strength LLE  Strength LLE: WFL  Comment: 4/5 t/o limited by LBP          GIRTH MEASUREMENT  (Tape on skin along anterior tibialis)    Lower Extremity R L   Date 06/08/14 06/08/14   Angle of Ankle at rest: 35 35   Groin             cm above tip of 2nd digit          cm above tip of 2nd digit                       cm above tip of 2nd digit         45 cm above tip of 2nd digit          51.8 49.2   40 cm abovetip of 2nd digit   52.1 48.8    30       cm abovetip of 2nd digit   37.7 37.8    20       cm abovetip of 2nd digit   28.4 28.0   Midfoot-Figure  8/Navicular:  cm above tip of 2nd digit  60.3 60.2   Metatarsal heads:  cm above tip of 2nd digit   27.0 27.0cm                  TOTAL GIRTH 257.3  cm   251.0 cm                                         Assessment   Assessment  Assessment: Decreased functional mobility ;Decreased ADL status;Decreased ROM;Decreased strength;Decreased endurance;Decreased high-level IADLs  Assessment: pt with swelling R>L LE resultant from chronic recurrent cellulitis which limits pts standing tol, fxnl mobility and limits her ability to work and exercise for fitness. Previous R knee surgery is also a factor contributing to swelling  due to scar tissue R knee. Pt will  benefit from skilled PT services.    Treatment Diagnosis: swelling R >L LE  Prognosis: Good  Requires PT Follow Up: Yes  Activity Tolerance  Activity Tolerance: Patient Tolerated treatment well  Plan  Plan: Plan of care initiated         Plan   Plan  Plan: Plan of care initiated  Progress Note  See Progress Note: Yes  Frequency and duration of tx  Days: 3  Weeks: 6    G-Code  PT G-Codes  Functional Assessment Tool Used: PT assessment  Score: 50% disabled  Functional Limitation: Mobility: Walking and moving around  Mobility: Walking and Moving Around Current Status [phone]): At least 40 percent but less than 60 percent impaired, limited or restricted  Mobility: Walking and Moving Around Goal Status [phone]): 0 percent impaired, limited or restricted    Goals  Long term goals  Time Frame for Long term goals : 6 wks  Long term goal 1: pt to be independent with HEP so that she can independently manage her LE swelling.   Long term goal 2: Increase activity tol so that  pt can tol light exercise for fitness at 3x/wk.  Long term goal 3: decreased girth R LE by at least 4-5 cm so increased ease with donning clothing and wearing shoes.   Long term goal 4: Pt to be independent with use of appropriate compression to R>L LE.  Long term goal 5: manage/reduce swelling so that pt can tol  standing x 30-45 min without increased R LE swelling.        Sydnee Levans, PT  License and Documentation Cosign  Therapy License Number: Sydnee Levans PT [id number] DPT

## 2014-06-08 NOTE — Progress Notes (Signed)
Physical Therapy      Outpatient Physical Therapy  Phone: [phone] Fax: [phone]     To: Referring Practitioner: Dr. Daune Perchocio Tussey      Patient: Angelica ReinLisa Alkire   DOB: 11/03/1985   MRN: [id number]  Evaluation Date: 06/08/2014      Diagnosis Information:  ?? Diagnosis: lymphedema 457.1, I89.0   ?? Treatment Diagnosis: swelling R >L LE     Physical Therapy Certification/Re-Certification Form  Dear Dr. Lowell Boutonussey  The following patient has been evaluated for physical therapy services and for therapy to continue, Medicare requires monthly physician review of the treatment plan. Please review the attached evaluation and/or summary of the patient's plan of care, and verify that you agree therapy should continue by signing the attached document and sending it back to our office.    Plan of Care/Treatment to date:  [x]  Therapeutic Exercise    []  Modalities:  [x]  Therapeutic Activity     []  Ultrasound  []  Electrical Stimulation  []  Gait Training      []  Cervical Traction []  Lumbar Traction  [x]  Neuromuscular Re-education    []  Cold/hotpack []  Iontophoresis   [x]  Instruction in HEP     Other:  [x]  Manual Therapy- MLD     [x]      compression        [x]  Aquatic Therapy      []            ?      Frequency/Duration:  # Days per week: []  1 day # Weeks: []  1 week []  5 weeks     []  2 days?   []  2 weeks [x]  6 weeks     [x]  3 days   []  3 weeks []  7 weeks     []  4 days   []  4 weeks []  8 weeks    Rehab Potential: []  Excellent [x]  Good []  Fair  []  Poor       Electronically signed by:  Sydnee LevansMelanie Patricia Perales, PT [phone] DPT        If you have any questions or concerns, please don't hesitate to call.  Thank you for your referral.      Physician Signature:________________________________Date:__________________  By signing above, therapist???s plan is approved by physician

## 2014-06-08 NOTE — Other (Signed)
Physical Therapy Daily Treatment Note  Date:  06/08/2014    Patient Name:  Angelica Jones    DOB:  [birth year]  MRN: [id number]  Restrictions/Precautions:  Recurrent chronic cellulitis, on regular penicillin injections- 1x every 3 wks, sees infectious disease doctor for this  Medical/Treatment Diagnosis Information:   ?? Diagnosis: lymphedema 457.1, I89.0  ?? Treatment Diagnosis: swelling R >L LE  Insurance/Certification information:  PT Insurance Information: [facility] community plan [place] medicaid 100% coverage, med Engineer, production for visits  Physician Information:  Referring Practitioner: Dr. Daune Perch  Plan of care signed (Y/N):  Faxed to MD  Visit# / total visits:  1/18  Pain level: 2-7/10     G-Code (if applicable):      Date / Visit # G-Code Applied:  /  PT G-Codes  Functional Assessment Tool Used: PT assessment  Score: 50% disabled  Functional Limitation: Mobility: Walking and moving around  Mobility: Walking and Moving Around Current Status [phone]): At least 40 percent but less than 60 percent impaired, limited or restricted  Mobility: Walking and Moving Around Goal Status [phone]): 0 percent impaired, limited or restricted    Progress Note:   Yes  eval   No  Next due by: Visit #10      Subjective:  See eval    Objective:   Observation:   Test measurements:      Exercises:  Exercise/Equipment Resistance/Repetitions Other comments                                                                            Other Therapeutic Activities:  Patient educated on PT POC, treatment, diagnosis, and prognosis; pt participated in goal setting.  D/w pt risks and benefits of PT tx.  Educated that for good lymphedema management, we need to find appropriate comfortable for pt to wear on daily basis. Requested pt find or purchase lymphedema bandaging supplies - she is agreeable  Pt oriented to pool - advised only do aquatics pending no open sores/wounds.    Home Exercise Program:  1/14: pt to purchase/find bandages for multilayer compression  bandaging, cont with use of "pump pants and node clearance prior to pump, additional self MLD to R popliteal node and swipes to R ant knee, pt to try JOBST sensifoot (knee socks) socks in 8-5 mmhg, trial of aquatic ex as tol/pool walking - pt belongs to [employer]    Manual Treatments:  To B, R>>L  LE: clear to ipsilateral axilla.  See f/s        CLEAR NODES 10x ea   NECK              AXILLA                                     ABDOMEN              GROIN              POPLITEAL X2    CLEAR ALT PATHWAY 10x ea    Re-clear alt path, 5x each position    # hand positions    FLUID MOBILIZATION 10x ea   Re-clear  alt path,5x each position     location    # hand positions    PROTEIN RESORPTION 10x ea   location    # hand positions    CLEAR FOOT & ANKLE OR HAND 10x ea   [place]???s    B malleolus    fan the cards    Clear dorsum    Clear thru webspace    Clear toes/fingers    fluid mob.    protein resorption    Re-clear all pos. 5x each      Modalities:    Compression:  Multi-layer compression bandaging to R LE: foot to below knee: once pt brings supplies.     Timed Code Treatment Minutes:  39    Total Treatment Minutes:  55  Treatment/Activity Tolerance:  [x]  Patient tolerated treatment well []  Patient limited by fatigue  []  Patient limited by pain  []  Patient limited by other medical complications  []  Other:     Prognosis: [x]  Good []  Fair  []  Poor    Patient Requires Follow-up: [x]  Yes  []  No    Plan:   []  Continue per plan of care []  Alter current plan (see comments)  [x]  Plan of care initiated []  Hold pending MD visit []  Discharge    Plan for Next Session:  MLD R LE clear to R axilla, MLD to L LE clear to L axilla if time allows, bandage R LE: foot to below knee once pt brings bandages, aquatic therapy 1-2x/wk for exercise with graduated compression provided by water, HEP, assistance finding compression socks/garments  to use long term that will be comfortable considering her skin is sensitive due to chronic cellulitis, land based  exercises - with compression to LE as tol--  to promote overall fitness and lymphatic flow  Anticipate PT tx being placed on hold after pt's upcoming  gastric bypass surgery in 06/2014    Electronically signed by:  Sydnee LevansMelanie Sharnee Douglass, PT [phone] DPT

## 2014-06-13 ENCOUNTER — Inpatient Hospital Stay: Attending: Family Medicine | Primary: Family Medicine

## 2014-06-13 ENCOUNTER — Inpatient Hospital Stay: Admit: 2014-06-13 | Attending: Rehabilitative and Restorative Service Providers" | Primary: Family Medicine
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
## 2014-06-13 NOTE — Other (Signed)
Physical Therapy Aquatic Flow Sheet   Date:  06/13/2014    Patient Name:  Angelica Jones    DOB:  01/29/1986  Restrictions/Precautions: Recurrent chronic cellulitis, on regular penicillin injections- 1x every 3 wks, sees infectious disease doctor for this  Medical/Treatment Diagnosis Information:   ?? Diagnosis: lymphedema 457.1, I89.0  ?? Treatment Diagnosis: swelling R >L LE  Insurance/Certification information: PT Insurance Information: [facility] [place] 100% coverage, med Engineer, productionnec for visits  Physician Information: Referring Practitioner: Dr. Daune Perchocio Tussey  Plan of care signed (Y/N): Faxed to MD  Visit# / total visits: 2/18  Pain level: 2-7/10       G-Code (if applicable):      Date G-Code Applied:    Current Status:   Goal Status:     Progress Note: []   Yes  [x]   No  Next due by: Visit #10:      Subjective:   Pt reports she already had therapy today for her low back. She has been having trouble. She had manual traction today.     Objective:  Observation:   Test measurements:      Electronically signed by:  Dan EuropeLauren K Zanya Lindo, PT    Key  B= Belt DB= Dumbells T= Theratube   H= Hydrotone N= Noodles W= Weights   P= Paddles S= Speedo equipment K= Kickboard     Exercises/Activities   Warm-up/Amb    Exercises      Slow forward   X 1  HR/TR  x 10    Slow sideways  x 1  Marches  x 10    Slow backwards   X 1  Mini-squats      Medium forward    4-way SLR  x 10 B each    Medium sideways    Hip circles/fig 8  x 10    Small shuffle    Hamstring curls  x 10    Jog    Knee extension      Braiding    Pelvic tilts  x 10 gentle    Bicycling    Scap squeezes          Shoulder flex/ext      Functional    Shoulder abd/add      Step    Shoulder H. abd/add      Lifting    Shoulder IR/ER      Hand to opp knee  x 5  Rowing      Push down squat    Bilateral pull down      UE PNF    Push/pull      LE PNF    Push downs      Wall push ups    Arm circles      SLS    Elbow flex/ext          Chin tuck      Stretching    UT shrugs/rolls       Gastroc/Soleus  2 x 30" (hold)  Rocking horse      Hamstring  2 x 30" B        SKTC  2 x 30" B  Other  Ankle pumps x 10    Piriformis  x    Noodle hang at end of session     Hip flexor          Ladder pull          Pec stretch  Post deltoid           Timed Code Treatment Minutes:  12    Total Treatment Minutes:  30    Treatment/Activity Tolerance:   Patient tolerated treatment well  Patient limited by fatigue   Patient limited by pain  Patient limited by other medical complications   Other:  Pain with gastroc stretcing    Prognosis:  Good  Fair   Poor    Patient Requires Follow-up:  Yes   No    Plan:    Continue per plan of care  Alter current plan (see comments)   Plan of care initiated  Hold pending MD visit  Discharge  Plan for Next Session:      Electronically signed by:  Dan Europe, PT

## 2014-06-16 NOTE — Other (Unsigned)
[facility] ED Encounter Arrival Date: 06/16/14 1543   Angelica Jones                            DOB: November 20, 1985 [address]   [name] [facility] [address] MRN: [id number] CSN: [id number]     HAR: [id number]     CHIEF COMPLAINT: Chief Complaint Patient presents with   Leg Swelling     HPI Angelica Jones is a 29 year old female who presents to the Emergency Room   complaining of right calf pain. The patient states she has a history of   cellulitis. She on Tuesday developed redness and swelling to the outer lower   right calf. She saw Dr. Olena Leatherwood who diagnosed her with phlebitis. She is here   because of increased pain. She is taking anti-inflammatory. She denies chest   pain or shortness of breath. She states is causing her leg to cramp.     REVIEW OF SYSTEMS See history of present illness for further details. All   other review of systems otherwise negative.     PAST MEDICAL HISTORY Past Medical History Diagnosis Date   PCOS (polycystic   ovarian syndrome)   Encarnacion Chu CNP   Hypertension   Bipolar affective   disorder (HCC)   Dr. Marnette Burgess   Cellulitis and abscess of leg   recurrent   cellulitis. on PCN every couple weeks   Edema   Morbid obesity (HCC)     Unspecified essential hypertension   Depressive disorder, not elsewhere   classified   Acute pancreatitis   Lymph edema   Anemia     FAMILY HISTORY Family History Problem Relation Age of Onset   pulm   embolus[other] [OTHER] Sister   pcos   other[other] [OTHER] Sister     Hypertension Mother   Seizures Mother   Diabetes Father   Hypertension Father     Other[other] [OTHER] Father   renal failure   Cancer Maternal Grandmother     uterine     SOCIAL HISTORY History     Social History   Marital Status: Single   Spouse Name: N/A   Number of   Children: N/A   Years of Education: N/A     Occupational History     Social History Main Topics   Smoking status: Never Smoker   Smokeless   tobacco: Never Used   Alcohol Use: 0.5 oz/week   1 Shots of liquor per  week      Comment: socially  1 x month   Drug Use: No   Sexual Activity: Not Currently     Copy: None     Other Topics Concern   Weight Concern Yes   Special Diet Yes   Exercise Yes     Seat Belt Yes   Self-Exams Yes     Social History Narrative     SURGICAL HISTORY Past Surgical History Procedure Laterality Date   Carpal   tunnel release Left   Cholecystectomy with/without common duct exploration    2003   [facility]   Arthroscopy knee     CURRENT MEDICATIONS Current Outpatient Prescriptions Medication Sig Dispense   Refill   hydrocodone-acetaminophen (NORCO) 5-325 MG TABS Take 1-2 tablets by   mouth every 6 (six) hours as needed. 20 tablet 0   naproxen (NAPROSYN) 500 MG   TABS Take 1 tablet by mouth 2 (two) times daily with meals.  30 tablet 0     oxycodone (ROXICODONE) 5 MG TABS Take 1 tablet by mouth every 4 (four) hours   as needed. 60 tablet 0   promethazine (PHENERGAN) 25 MG TABS Take 1 tablet by   mouth every 6 (six) hours as needed. 50 tablet 0   pantoprazole (PROTONIX) 20   MG TBEC Take 1 tablet by mouth daily. 60 tablet 6   sucralfate (CARAFATE) 1 G   TABS Take 1 tablet by mouth 4 (four) times daily before meals and nightly. 60   tablet 0   ondansetron (ZOFRAN-ODT) 4 MG TBDP Take 1 tablet by mouth every 8   (eight) hours as needed. 20 tablet 0   ketorolac (TORADOL) 10 MG TABS Take 1   tablet by mouth 4 (four) times daily as needed. 30 tablet 0   Multiple Vitamin   (MULTI-VITAMIN DAILY PO) Take  by mouth daily.   Penicillin G Benzathine &   Proc (BICILLIN C-R, 1200000,) 1200000 UNIT/2ML SUSP by Intramuscular route   every 21 days.   doxycycline (DORYX) 100 MG CPEP Take 100 mg by mouth 2 (two)   times daily.   fexofenadine (ALLEGRA) 180 MG TABS Take 180 mg by mouth 3   (three) times daily.   famotidine (PEPCID) 40 MG TABS Take 1 tablet by mouth 2   (two) times daily. 60 tablet 6   EPINEPHrine (EPIPEN 2-PAK) 0.3 MG/0.3ML SOAJ   Inject 0.3 mg as needed. 1 each 1   albuterol 108 (90 BASE)  MCG/ACT AERS Use   2 puffs every 4 (four) hours as needed. 1 inhaler 1   lisinopril   (PRINIVIL,ZESTRIL) 10 MG TABS Take 1 tablet by mouth daily. 90 tablet 1     citalopram (CELEXA) 20 MG TABS Take 20 mg by mouth daily.     ALLERGIES Allergies Allergen Reactions   Clindamycin Anaphylaxis   Prednisone   Other (See Comments)   pancreatitis     PHYSICAL EXAM VITAL SIGNS:.BP 175/83 mmHg   Pulse 90   Temp(Src) 97.8  F   (36.6  C) (Oral)   Resp 18   Ht 67" (170.2 cm)   Wt 353 lb (160.12 kg)   BMI   55.27 kg/m2   SpO2 100% Constitutional: Well developed, Well nourished, No   acute distress Skin: Warm, Dry, No erythema Extremities: Intact distal pulses.   Musculoskeletal: exam: Focal localized tenderness to the lower lateral aspect   of the right calf with minimal inflammation of the skin Neurologic:: Alert &   oriented x 3, Normal motor function, Normal sensory function, No focal   deficits noted.     RADIOLOGY/PROCEDURES VL LOWER EXTREM VENOUS IMAGE RT    (Results Pending)    negative for superficial or deep venous thrombosis         COURSE & MEDICAL DECISION MAKING Pertinent Labs & Imaging studies reviewed.   (See chart for details)     The patient will be placed Naprosyn and Vicodin. She was to follow-up with   her primary care physician. Return to emergency for worsening.     FINAL IMPRESSION Right calf pain     Patient was prescribe the medication below:     New Prescriptions  HYDROCODONE-ACETAMINOPHEN (NORCO) 5-325 MG TABS    Take   1-2 tablets by mouth every 6 (six) hours as needed.  NAPROXEN (NAPROSYN) 500   MG TABS    Take 1 tablet by mouth 2 (two) times daily with meals.  Follow-up Information  Follow up With Details Comments Contact Info  Magdalen Spatzussey,   Rocio G, MD   [id number] [place] [id number] [phone]         Quita SkyeJames D. Isac CaddySchrichten, MD     Laurence SlateSchrichten, James D., MD 06/16/14 1646         _________________________________  Signed by:  Quita SkyeJAMES D. SCHRICHTEN  Quita SkyeJAMES D. SCHRICHTEN    SC    D: 06/16/2014  04:46 PM  T: 06/16/2014 04:46 PM    This document is confidential medical information.  Unauthorized disclosure or   use of this information is prohibited by law.  If you are not the intended recipient of this document, please advise us by   calling immediately [phone].

## 2014-06-16 NOTE — Other (Unsigned)
CURRENT STATUS IS EMERGENCY .      :     Any questions regarding PATIENT CLASS/STATUS should be directed to the Care   Management Departments: [facility] [phone] or [facility] [phone] or [facility]  [phone].         _________________________________  Signed byJamison Neighbor:    [facility]  NOTIFY    ZZ    D: 06/16/2014 03:43 PM  T:    This document is confidential medical information.  Unauthorized disclosure or   use of this information is prohibited by law.  If you are not the intended recipient of this document, please advise us by   calling immediately [phone].

## 2014-06-16 NOTE — Other (Unsigned)
______________________________________________________________________________  __________________________________________       Angelica Jones     [address]     [place] [address]     Phone: [phone]          Discharge Summary                                                        Angelica Jones  07/28/2014 Admission                                                       MRN:   [id number]      Admission Information    ______________________________________________________________________________  __________________________________________                                               Department       07/28/2014                                  [id number]    ______________________________________________________________________________  __________________________________________    ______________________________________________________________________________  __________________________________________  Allergies as of 07/31/2014    ______________________________________________________________________________  __________________________________________     Allergen                                        Reactions     Clindamycin                                     Anaphylaxis     Prednisone                                      Other (See Comments)     States any steroids     pancreatitis     Betadine [Povidone Iodine]                      Hives, Rash    ______________________________________________________________________________  __________________________________________    ______________________________________________________________________________  __________________________________________  Reason for Admission  ______________________________________________________________________________  __________________________________________         Admit From ED/Outpatient/SDS/Triage (ADT109) Once-Routine ADT       Diagnosis: Morbid obesity  (HCC)      ______________________________________________________________________________  __________________________________________  Problem                                                                 Date   Reviewed: 06/23/2014    ______________________________________________________________________________  __________________________________________  Class              Noted       * (Principal)Morbid obesity with BMI of 50.0-59.9, adult (HCC)                               Filimon Miranda     GERD without esophagitis                                                                     12/10/2013     HTN (hypertension)                                                                           07/13/2012     Hyperlipidemia                                                                               03/30/2014     PCOS (polycystic ovarian syndrome)                                                           Arnetha Silverthorne    ______________________________________________________________________________  __________________________________________    ______________________________________________________________________________  __________________________________________  Pending Results    ______________________________________________________________________________  __________________________________________     Start           07/29/14     0600         Obtain fingerstick blood sugar Once-Routine     07/28/14     1256         Obtain fingerstick blood sugar Once-Routine     07/28/14     1256         POCT Bedside Glucose 4 times daily before meals & at bedtime    ______________________________________________________________________________  __________________________________________    ______________________________________________________________________________  __________________________________________  Angelica Jones Future  Appointments    ______________________________________________________________________________  __________________________________________                                Provider               Department               Dept Phone             Center       08/02/2014 8:00 AM           [facility]; [facility]  [facility] Ambulatory            [phone]           [facility]                                BED 6                  Treatment Ctr     08/04/2014 2:45 PM          Hardie Lora, [facility] Weight           [phone]           [facility]                                MD                     Management Center                                                       Surgery     08/04/2014 3:00 PM          Charlyne Mom, RD   [facility] Gastric Bypass        [phone]           [facility]                                                       Dietetics     09/22/2014 11:15 AM         Gracelyn Nurse., MD  [facility] Reproductive         [phone]           [facility]                                                       Endocrinology    ______________________________________________________________________________  __________________________________________    ______________________________________________________________________________  __________________________________________  bmk  Medication List    ______________________________________________________________________________  __________________________________________  TAKE these medications AS PRESCRIBED. Do not change the dosage on your own.   Call your health care provider if you have  questions.    ______________________________________________________________________________  __________________________________________                                                               Last dose        Next dose      Comments       As Needed             famotidine 40 MG Tabs     Commonly known as: PEPCID     Take 1 tablet by mouth  2 (two) times  daily.    ______________________________________________________________________________  __________________________________________    ______________________________________________________________________________  __________________________________________  Angelica Jones taking these medications    ______________________________________________________________________________  __________________________________________                                                               Last dose        Next dose      Comments       As Needed             albuterol 108 (90 BASE) MCG/ACT Aers     Commonly known as: PROVENTIL HFA     Use 2 puffs every 4 (four) hours as needed.       BICILLIN C-R (1200000) 1200000 UNIT/2ML Susp     Generic drug: Penicillin G Benzathine & Proc     by Intramuscular route every 21 days.       citalopram 20 MG Tabs     Commonly known as: CELEXA     Take 20 mg by mouth daily.       doxycycline 100 MG Cpep     Commonly known as: DORYX     Take 100 mg by mouth 2 (two) times daily.       EPINEPHrine 0.3 MG/0.3ML Soaj     Commonly known as: EPIPEN 2-PAK     Inject 0.3 mg as needed.       estradiol 2 MG Tabs     Commonly known as: ESTRACE     Take 1 tablet by mouth daily.       fexofenadine 180 MG Tabs     Commonly known as: ALLEGRA     Take 180 mg by mouth 3 (three) times daily. Outpatient     prescriber recommended increase dose for patient's     increased weight.  He does want fexofenadine 180mg  TID.      Patient breaks out in hives really easily.  This helps     to prevent/treat hives (especially on the face).       lisinopril 10 MG Tabs     Commonly known as: PRINIVIL,ZESTRIL     Take 1 tablet by mouth daily.       MULTI-VITAMIN DAILY PO     Take  by mouth daily.       oxycodone 5 MG Tabs     Commonly known as: ROXICODONE     Take 1 tablet by mouth every 4 (four) hours as needed.       pantoprazole 20 MG Tbec     Commonly known as: PROTONIX     Take 1 tablet by mouth daily.       promethazine 25 MG  Tabs     Commonly known as: PHENERGAN     Take 1 tablet by mouth every 6 (six) hours as needed.       sucralfate 1 G Tabs     Commonly known as: CARAFATE     Take 1 tablet by mouth 4 (four) times daily before     meals and nightly.    ______________________________________________________________________________  __________________________________________    ______________________________________________________________________________  __________________________________________      Post Discharge Orders  ______________________________________________________________________________  __________________________________________     No orders found for display  ______________________________________________________________________________  __________________________________________  Patient Instructions  ______________________________________________________________________________  __________________________________________    Bariatric Surgery and Weight Management  Angelica Mutters, MD  Angelica Barb, MD  [facility]  [phone]    Prescriptions  *  Pain medication - You will receive a prescription for pain at your   pre-operative appointment (unless you have an  allergy).  Take as needed for pain following your surgery.  You can also take   Tylenol elixer as needed for pain.  Do not  take more than 4000 mg of Tylenol in 24 hours.  *  Anti-Nausea Medication - You will receive a prescription for nausea at your   pre-op appointment.  Take as needed for  nausea.  *  Proton Pump Inhibitior - If you had Roux-en-Y Gastric Bypass, you should take   your proton pump inhibitor (PPI) as  directed (i.e Protonix).  If you were prescribed a PPI after sleeve   gastrectomy, take as directed.  Food Progression - Critical  IMPORTANT:  You will need to bring your log of intake for your first week   after surgery.  Your diet will be discussed at  the first post-op appointment with the  dietitian as you transition from full   liquids to soft chopped foods.  After surgery, it is necessary to prepare the new stomach for normal foods by   using a progression of food from clear  liquids to normal foods. If you feel tightness or pressure in your chest, that   means you are full. Wait 30 minutes  before you try to eat again. Everyone is different, but this might take 1.5 to   two months to adjust completely.    Hydration  *  Please, no carbonated drinks EVER!  *  Daily fluid intake is 6 to 8 cups per day (48-64 ounce).  *  You will need to be sure you are getting your fluids in EVERYDAY  *  Since your stomach is smaller, you will need to drink fluid between meals and   choose beverages that will provide fluid  your body can use.  *  Good fluid choices: Water is the best fluid and you should consume plenty of   it over a day.  Other fluids that can  prevent dehydration are diluted juices, broth, decaffeinated tea or coffee,   and milk.  *  Fluid Robbers: Coffee; regular tea; soft drinks with caffeine; alcohol;   chocolate milk; and hot cocoa.  Caffeine can  dehydrate you so try to choose fluids without caffeine.  If you consume   caffeinated drinks you may want to decrease the  amount slowly prior to surgery, because a rapid decrease in caffeine can cause   headaches (sometimes very severe).  AFTER SURGERY DIET  Stage 1 (Immediately Post-operative ???????? 3 days after your surgery)  This diet may last two to three days after surgery, depending on your   acceptance and tolerance of clear liquids. During  this stage, small amounts of clear liquids will be consumed. You will have to   sip liquids slowly ???????? not to exceed four  ounces per 30 minutes ???????? with the goal of 48 to 64 ounces daily. Carbonated   beverages are prohibited.  Take 1 hour to drink 8 ounces slowly- stop if full!  Includes:????  Water Diluted juices 50 / 50  Low-fat brothDecaffeinated tea or coffee  Sugar-free gelatinSugar-free  popsicles  Diabetishield or Impact - in hospital  Stage 2 - Full Liquid (2-7 days after your surgery):  This diet may last two to seven days after surgery depending on your   acceptance and tolerance of clear liquids. It may  take up to 1 week to tolerate clear liquids. Proceed to Stage 2 only after   tolerating clear liquids without nausea.  During this stage, liquid nutritional supplements and other liquids are   introduced slowly. This diet contains lactose  that is sometimes not tolerated well after surgery. If you cannot tolerate a   liquid/food, eliminate it and try it again  in one to two weeks.    Full liquids????Include:????  *  Protein shakes- 20-30 grams of protein per shake  (low fat, low sugar)  *  Strained creamed soups  *  V-8 juice  *  Thinned Cream of Wheat  *  Sugar free low fat pudding  *  ??????? Lite??????? yogurt- no seeds  *  No-sugar-added instant breakfast drinks  Plus, continue:????Clear liquid diet  Avoid:  *  Spicy foods  *  Caffeine  *  Carbonated beverages  *  High-sugar beverages (more than five grams of sugar per serving)  Stage 3 ???????? BlendedStage 3 ???????? Blended: ???? will be discussed at the first   post-op appointment with the dietitian as you  transition from full liquids to chopped foods.    Post-Operative Appointments  You will have your after surgery dietitian and surgeon appointments at  *  1 week post-op, 3 weeks post-op, and 6 weeks post-op  *  3 months post-op (Labs to be done),  6 months post-op  *  1 year post-op (Labs to be done) and as needed  *  You should also schedule a follow up appointment with your medical doctor   approximately 2 ???????? 4 weeks from your surgery  date to discuss the need to adjust your medications.  Activity  *  Do not lift, pull, or push anything greater than 10 pounds for 6 weeks.  *  Walk as much as possible and increase your level of activity as tolerated.  *  Continue with deep breathing, coughing, and using your incentive spirometer   until your abdominal  pain is minimal.  This  will help prevent pneumonia.  *  You may shower.  You may not take a bath until your wounds are completely   healed.    Incision Care  *  Wash your hands immediately before you touch your dressing or incision area.  *  You may have 4 or more small incisions. They are closed with skin adhesive   strips. Skin adhesive strips can get wet and  will fall off on their own. Check your incisions and surrounding area daily   for any redness, swelling, discoloration,  fluid (drainage), or bleeding. Dark red, dried blood may appear under these   coverings. This is normal.  *  You do not need to keep a dressing/gauze over the incisions unless you are   experiencing drainage.  *  If you experience drainage, use a warm, moist compress over the incision for   20-30 minutes at a time, 3-4 times a day.  Call and make your surgeon????????s office aware of any drainage.  *  If you have a drain, it will be removed at your follow-up visit or before you   leave the hospital.  *  If your drain is left in, follow your caregiver's instructions on drain care.  *  If  your drain is taken out, keep a clean, dry bandage over the drain site.                  Signs and Symptoms to be Aware of and Call Office  [phone]; if after hours call Answering Service at [phone]):  *  Fever greater than 101.50 Fahrenheit lasting longer than 1 - 2 hours  *  Uncontrolled pain that does not improve with pain medication  *  Uncontrolled nausea that does not improve with nausea medication  *  Vomiting  *  Shortness of breath  *  Chest pain  *  Excessive bleeding, redness, drainage, swelling or any other problems at the   surgical site.  *  Itching, rash, or hives  *  Bloody bowel movements  *  Inability to urinate for more than 6 hours  *  Dizzy episodes or faint while standing.  *  Check your incisions and surrounding area daily for any redness, swelling,   discoloration, drainage or bleeding. If any  of these are present, call your  doctor. (Dark red, dried blood may appear   under these coverings - this is normal).    ** If you need to go to the emergency room, it is preferred that you return to   [facility] [phone]).  If it is a life threatening emergency, call 911.    Other  If you are diabetic it is important for you to manage your blood sugars   carefully and accurately to support wound  healing and reduce the risk of infection.  *  Be sure to contact your primary care physician regarding any necessary dosage   adjustments of your medications (blood  pressure, diabetes, etc.), as some medication dosages may need to be changed   following surgery and weight loss.  *  Small pills can be swallowed.  Larger pills will need to be crushed for the   first month after surgery.  *  Some pills such as extended release (XR) and long acting (LA) cannot be   crushed.  *  Please discuss medications with your primary care physician, surgeon and/or   pharmacist.  *  IF you still have your gallbladder, be certain you receive your prescription   for ursodiol.  You will need to begin  taking it one week after surgery and will continue taking it for 6 months   after your surgery.  This medication helps  prevent the development of gallbladder stones.  The capsule must be opened and   mixed with drink/food for the TWO weeks.  Two weeks after surgery it can be swallowed because it is a soft capsule and   will dissolve quickly once it is in your  pouch (new stomach).    Over-the-Counter Medications  *  For discomfort, headache, or fever  *  You can take Tylenol caplets.    *  Do not take aspirin, ibuprofen, advil, alleve, motrin, or naproxen.    *  For cold, flu, cough or allergy symptoms  *  Do not take any products that contain aspirin, ibuprofen, advil, alleve,   motrin, or naproxen.    *  For diarrhea  *  Imodium AD for diarrhea    *  For constipation  *  Stool softeners:  Senekot, Dulcolax, Miralax, or Milk of Magnesia    *  For  gas  *  GasX (chewable) for gas,  Mylanta, or Mylicon    ______________________________________________________________________________  __________________________________________  Patient Belongings    ______________________________________________________________________________  __________________________________________                                            Most Recent Value       Patient Belongings at Bedside     Belongings at Bedside                  Vision, Clothing     Vision - Marine scientist     Patient Belongings Sent Home/Family     Belongings Sent Home/Family            None     Patient Belongings Sent to Security     Belongings Sent to Security            None    ______________________________________________________________________________  __________________________________________    ______________________________________________________________________________  __________________________________________    Additional Information    ______________________________________________________________________________  __________________________________________     IF YOU ARE A SMOKER OR HAVE SMOKED IN THE LAST 12 MONTHS, WE ENCOURAGE YOU   TO EXPLORE OPTIONS FOR QUITTING. Refer to     the literature given to you while in the hospital for advice on how to   quit.       For Pneumonia Patients: I understand that the pneumonia vaccine is   recommended for people 23 and older and people     with chronic health conditions, once in a lifetime. It should be repeated   every 5-10 years if received before age 93.     The flu vaccine should be given every year for people 78 and older, younger   for those with chronic health conditions.       For Heart Failure/Cardiac Patients: I understand:  - Regular activity   within my limitations is important for my     health and can consult my doctor for suggestions on exercise.  - Eating a   low fat, low cholesterol, & low sodium diet     with plenty  of fruits and vegetables can reduce my chance of suffering a   future heart attack.  - Weighing myself     daily and reporting a gain of 2-3 pounds a day and/or 5-6 pounds a week to   my physician is important.  - If any of my     symptoms worsen, such as edema/swelling or difficulty breathing, contact my   doctor or go to the nearest emergency     department. - Be sure to keep all appointments with my doctor.       For Stroke Patients: I understand that by carefully controlling and   monitoring any of the risk factors listed, I can     decrease my risk of future stroke:  - High Blood Pressure (hypertension)     - High Blood Cholesterol (hyperlipidemia)     - Diabetes     - Smoking     - Alcohol Abuse     - Drug Abuse     If you are experiencing signs and symptoms of a stroke, which can include:     *     Slurred speech, confusion or trouble understanding     *     Severe headache     *  Sudden difficulty with vision     *     Sudden numbness or weakness of an extremity     *     Loss of balance or coordination     CALL 911 IMMEDIATELY        Grateful Patient  ______________________________________________________________________________  __________________________________________       If you were happy with the care you received and would like to honor your   physician, nurse or other caregiver, e-mail     the Foundation to request a [facility] brochure at:   [email or web address] [place], [place]     and [place] patients/families can contact us at [phone]. [facility] patient/families can contact us at     [phone].          ______________________________________________________________________________  __________________________________________     I have received a copy of the After Visit Summary:       Patient Signature:           Date:                     Responsible Party (Printed):           Responsible Party  Signature:                ______________________________________________________________________________  __________________________________________  MyChart Activation Instructions  ______________________________________________________________________________  __________________________________________     July 31, 2014       Rosezella FloridaLisa M Legaspi     [address]     [place] [id number]       Dear [name]:       Thank you for enrolling in MyChart. Please follow the instructions below to   securely access your online medical     record. MyChart allows you to send messages to your doctor, view your test   results, renew your prescriptions, request     appointments, and more.       How Do I Sign Up? In your Internet browser, go to   [email or web address]. Click on the Sign Up Now link in the     Sign In box. You will see the New Member Sign Up page. Enter your MyChart   Access Code exactly as it appears below.     You will not need to use this code after you ???????? ve completed the sign-up   process. If you do not sign up before the     expiration date, you must request a new code.       MyChart Access Code: Activation code not generated Current MyChart Status:   Active       Enter your last 4 digits of your Social Security Number and Date of Birth   (mm/dd/yyyy) as indicated and click Submit.     You will be taken to the next sign-up page. Create a MyChart ID. This will   be your MyChart login ID and cannot be     changed, so think of one that is secure and easy to remember. Create a   Clinical biochemistMyChart password. You can change your password     at any time. Enter your Password Reset Question and Answer. This can be   used at a later time if you forget your     password.  Enter your e-mail address. You will receive e-mail notification   when new information is available in     MyChart. Click Sign Up. You can now view your medical record.  Additional Information If you have questions, you can email   [email or web address].   Remember, MyChart is NOT to be     used for urgent needs. For medical emergencies, dial 911.        This document has removed original colors, tables and or formatting to be   viewed in this system and is therefore  not the legal report and should not be relied on as the official report.  If   there are any questions, the legal  report in the originating system should be viewed and used as the   authoritative Mohid Furuya.

## 2014-06-19 ENCOUNTER — Encounter: Primary: Family Medicine

## 2014-06-19 ENCOUNTER — Inpatient Hospital Stay: Attending: Family Medicine | Primary: Family Medicine

## 2014-06-19 NOTE — Other (Signed)
Physical Therapy  Cancellation/No-show Note  Patient Name:  Angelica ReinLisa Desmarais  DOB:  01/13/1986   Date:  06/19/2014  Cancelled visits to date: 0  No-shows to date: 1    Patient status for today's appointment patient:  []   Cancelled  []   Rescheduled appointment  [x]   No-show  06/19/14 (aquatic)     Reason given by patient:  []   Patient ill  []   Conflicting appointment  []   No transportation    []   Conflict with work  []   No reason given  []   Other:     Comments:      Phone call information:   []   Phone call made today to patient at _ time at number provided:      []   Patient answered, conversation as follows:    []   Patient did not answer, message left as follows:  [x]   Phone call not made today    Electronically signed by:  Biagio BorgSandy Lorielle Boehning, PT  [phone]

## 2014-06-21 ENCOUNTER — Inpatient Hospital Stay: Primary: Family Medicine

## 2014-06-21 NOTE — Other (Signed)
Physical Therapy  Cancellation/No-show Note  Patient Name:  Angelica ReinLisa Darnold  DOB:  07/04/1985   Date:  06/21/2014  Cancelled visits to date: 0  No-shows to date: 2    Patient status for today's appointment patient:  []   Cancelled  []   Rescheduled appointment  [x]   No-show  06/19/14 (aquatic) , 06/21/14    Reason given by patient:  []   Patient ill  []   Conflicting appointment  []   No transportation    []   Conflict with work  []   No reason given  []   Other:     Comments:      Phone call information:   [x]   Phone call made today to patient at _1429   time at number provided:   [phone]  Spoke with pt   [x]   Patient answered, conversation as follows:  Advised of missed appointments,  She said she didn't have the appt for today written down. A nd she was sick on Monday,  Stated she has been Dx with phlebitis,  Advised would like to have Note from MD stating OK to perform MLD.  Reviewed appointment schedule with pt.   []   Patient did not answer, message left as follows:  []   Phone call not made today    Electronically signed by:  Biagio BorgSandy Abi Shoults, PT  [phone]

## 2014-06-21 NOTE — Other (Signed)
Physical Therapy  Cancellation/No-show Note  Patient Name:  Angelica ReinLisa Jones  DOB:  01/07/1986   Date:  06/21/2014  Cancelled visits to date: 0  No-shows to date: 2    Patient status for today's appointment patient:  []   Cancelled  []   Rescheduled appointment  [x]   No-show  06/19/14 (aquatic) , 06/21/14    Reason given by patient:  []   Patient ill  []   Conflicting appointment  []   No transportation    []   Conflict with work  []   No reason given  []   Other:     Comments:      Phone call information:   [x]   Phone call made today to patient at _ time at number provided:      []   Patient answered, conversation as follows:    []   Patient did not answer, message left as follows:  []   Phone call not made today    Electronically signed by:  Biagio BorgSandy Lilianah Buffin, PT  [phone]

## 2014-06-23 ENCOUNTER — Encounter: Primary: Family Medicine

## 2014-06-26 ENCOUNTER — Encounter: Primary: Family Medicine
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
## 2014-06-28 ENCOUNTER — Inpatient Hospital Stay: Primary: Family Medicine
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
## 2014-06-29 ENCOUNTER — Inpatient Hospital Stay: Primary: Family Medicine
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
## 2014-07-03 ENCOUNTER — Encounter: Attending: Family Medicine | Primary: Family Medicine

## 2014-07-14 ENCOUNTER — Encounter: Attending: Family Medicine | Primary: Family Medicine

## 2014-07-14 MED ORDER — ONDANSETRON HCL 8 MG PO TABS
8 MG | ORAL_TABLET | Freq: Three times a day (TID) | ORAL | Status: DC | PRN
Start: 2014-07-14 — End: 2015-05-07

## 2014-07-14 NOTE — Telephone Encounter (Signed)
Ok zofran 8 mg   One po tid prn nausea/emesis  # 21 no rf

## 2014-07-14 NOTE — Telephone Encounter (Signed)
Spoke with patient, gave message

## 2014-07-14 NOTE — Telephone Encounter (Signed)
Please advise.

## 2014-07-14 NOTE — Telephone Encounter (Signed)
Patient called Today at 9am to cancel a Preop appointment that she had scheduled for today at 10:15 due to nausea and vomiting. She did not reschedule her preop. Patient is requesting Zofran to be sent to the pharmacy. Please advise

## 2014-07-14 NOTE — Telephone Encounter (Signed)
Med has been sent to pharm.    Pt informed.

## 2014-07-25 ENCOUNTER — Ambulatory Visit
Admit: 2014-07-25 | Discharge: 2014-07-25 | Payer: PRIVATE HEALTH INSURANCE | Attending: Family Medicine | Primary: Family Medicine

## 2014-07-25 DIAGNOSIS — Z01818 Encounter for other preprocedural examination: Secondary | ICD-10-CM

## 2014-07-25 LAB — URINE DRUG SCREEN

## 2014-07-25 MED ORDER — CITALOPRAM HYDROBROMIDE 20 MG PO TABS
20 MG | ORAL_TABLET | Freq: Every day | ORAL | Status: DC
Start: 2014-07-25 — End: 2015-05-07

## 2014-07-25 MED ORDER — LORAZEPAM 1 MG PO TABS
1 MG | ORAL_TABLET | Freq: Every day | ORAL | Status: DC | PRN
Start: 2014-07-25 — End: 2015-05-07

## 2014-07-25 MED ORDER — LISINOPRIL 10 MG PO TABS
10 MG | ORAL_TABLET | Freq: Every day | ORAL | Status: DC
Start: 2014-07-25 — End: 2015-05-07
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
## 2014-07-26 ENCOUNTER — Encounter: Attending: Family Medicine | Primary: Family Medicine

## 2014-09-16 NOTE — Other (Unsigned)
CURRENT STATUS IS EMERGENCY .      :     Any questions regarding PATIENT CLASS/STATUS should be directed to the Care   Management Departments: [facility] [phone] or [facility] [id number] or [facility]  [phone].         _________________________________  Signed byJamison Neighbor:    [facility]  NOTIFY    ZZ    D: 09/16/2014 12:39 AM  T:    This document is confidential medical information.  Unauthorized disclosure or   use of this information is prohibited by law.  If you are not the intended recipient of this document, please advise us by   calling immediately [phone].

## 2014-09-16 NOTE — Other (Unsigned)
------------------------------------------------------------------------------  -- Attestation signed by Serita Sheller, MD at 09/16/14 [phone] EMERGENCY DEPARTMENT   - ATTENDING NOTE         I have personally seen and examined this patient. I have fully   participated in the care of this patient with the resident/MLP.  I have   reviewed and agree with all pertinent clinical information including history,   physical exam, labs, radiographic studies and the plan. I have also reviewed   and agree with the medications, allergies and past medical history sections   for this patient. Patient presents with midepigastric abdominal pain ever   since she had her gastric bypass surgery per just not any significant vomiting   here. But she's not daily keep a whole lot down she feels that she is   dehydrated she was at a wedding tonight and was dizzy and more concerned about   dehydration so she came in for evaluation. She was given IV fluids here. Her   laboratory studies are reassuring she'll be discharged home     Electronically signed by: Serita Sheller, MD   ------------------------------------------------------------------------------  --     [facility] ED Encounter Arrival Date: 09/16/14 0039   Angelica Jones                            DOB: Sep 14, 1985 [address]   Lenor Coffin [facility] [address] MRN: [id number] CSN: [id number]     HAR: [id number]     [facility] - General NOTE     CHIEF COMPLAINT Chief Complaint Patient presents with   General Medical     HPI Angelica Jones is a 29 year old female who presents to the ER with   complaints of midepigastric abdominal pain patient states is from going on   ever since she had a gastric bypass surgery done last month, patient states   that over the last several days she has had increased diarrhea denies any   nausea or vomiting denies any constipation, patient denies any dysuria or   hematuria or any other urinary symptoms, patient states that she feels    lightheaded secondary to be out of drink enough fluids secondary to the pain   in the midepigastric region, patient's procedure was performed by Dr. Jeanne Ivan.   She states that she is scheduled to get an upper endoscopy performed. Patient   denies any other chest pain or shortness of breath patient denies any pain in   the abdomen except for the midepigastric region and denies any new symptoms or   changes from her baseline.     PAST MEDICAL HISTORY Past Medical History Diagnosis Date   PCOS (polycystic   ovarian syndrome)   Vesta Mixer CNP   Hypertension   Bipolar affective   disorder ([place])   Dr. [name]   Cellulitis and abscess of leg   recurrent   cellulitis. on PCN every couple weeks   Edema   Morbid obesity ([place])     Unspecified essential hypertension   Depressive disorder, not elsewhere   classified   Acute pancreatitis   Lymph edema   Anemia     SURGICAL HISTORY Past Surgical History Procedure Laterality Date   Carpal   tunnel release Left   Cholecystectomy with/without common duct exploration    2003   [facility]   Arthroscopy knee Right   lateral release   Gastric bypass     CURRENT MEDICATIONS Prior  to Admission medications Medication Sig Start Date   End Date Taking? Authorizing Provider Multiple Vitamin (MULTI-VITAMIN DAILY   PO) Take  by mouth daily.   Yes HISTORICAL MED doxycycline (DORYX) 100 MG CPEP   Take 100 mg by mouth 2 (two) times daily.   Yes HISTORICAL MED lisinopril   (PRINIVIL,ZESTRIL) 10 MG TABS Take 1 tablet by mouth daily. 10/10/13 Yes [name], MD penicillin V potassium (VEETID) 500 MG TABS Take 1 tablet by   mouth 4 (four) times daily. 08/06/14 09/16/14  Scordo, Minette Headland A., CNP   hydrOXYzine HCl (ATARAX) 25 MG TABS Take 1 tablet by mouth 3 (three) times   daily as needed. 08/04/14 09/16/14  Tymitz, [name], MD estradiol   (ESTRACE) 2 MG TABS Take 1 tablet by mouth daily. 07/12/14 09/16/14 [name], MD promethazine (PHENERGAN) 25 MG TABS Take 1 tablet by mouth   every 6  (six) hours as needed. 06/14/14   Tymitz, [name], MD oxycodone   (ROXICODONE) 5 MG TABS Take 1 tablet by mouth every 4 (four) hours as needed.   06/14/14 09/16/14  Tymitz, [name], MD pantoprazole (PROTONIX) 20 MG TBEC   Take 1 tablet by mouth daily. 06/14/14 09/16/14 Tymitz, [name], MD   sucralfate (CARAFATE) 1 G TABS Take 1 tablet by mouth 4 (four) times daily   before meals and nightly. 04/28/14 09/16/14  Tessa Lerner, PA-C Penicillin G   Benzathine & Proc (BICILLIN C-R, 1200000,) 1200000 UNIT/2ML SUSP by   Intramuscular route every 21 days.    HISTORICAL MED fexofenadine (ALLEGRA)   180 MG TABS Take 180 mg by mouth 3 (three) times daily. Outpatient prescriber   recommended increase dose for patient's increased weight. He does want   fexofenadine 113m TID.  Patient breaks out in hives really easily. This helps   to prevent/treat hives (especially on the face).    HISTORICAL MED famotidine   (PEPCID) 40 MG TABS Take 1 tablet by mouth 2 (two) times daily. Patient   taking differently: Take 40 mg by mouth 2 (two) times daily. For severe   allergies/hives 10/12/13   EDutch Quint, MD EPINEPHrine (EPIPEN 2-PAK) 0.3   MG/0.3ML SOAJ Inject 0.3 mg as needed. 10/12/13 EDutch Quint, MD   albuterol 108 (90 BASE) MCG/ACT AERS Use 2 puffs every 4 (four) hours as   needed. 10/12/13   EDutch Quint, MD citalopram (CELEXA) 20 MG TABS Take 20   mg by mouth daily.  09/16/14  HISTORICAL MED     ALLERGIES Allergies Allergen Reactions   Clindamycin Anaphylaxis   Prednisone   Other (See Comments)   States any steroids pancreatitis   Betadine [Povidone   Iodine] Hives and Rash     FAMILY HISTORY Family History Problem Relation Age of Onset   pulm   embolus[other] [OTHER] Sister   pcos   other[other] [OTHER] Sister     Hypertension Mother   Seizures Mother   Diabetes Father   Hypertension Father     Other[other] [OTHER] Father   Cancer Maternal Grandmother   uterine     SOCIAL HISTORY History     Social History    Marital Status: Single   Spouse Name: N/A   Number of   Children: N/A   Years of Education: N/A     Occupational History     Social History Main Topics   Smoking status: Never Smoker   Smokeless   tobacco: Never Used  Alcohol Use: 0.5 oz/week   1 Shots of liquor per week      Comment: socially  1 x month   Drug Use: No   Sexual Activity: Not Currently     Birth Control/ Protection: None     Other Topics Concern   Weight Concern Yes   Special Diet Yes   Exercise Yes     Seat Belt Yes   Self-Exams Yes     Social History Narrative     REVIEW OF SYSTEMS All other review of systems are negative except for history   of present illness     PHYSICAL EXAM VITAL SIGNS: BP 125/68 mmHg   Pulse 70   Temp(Src) 97.8  F   (36.6  C)   Resp 14   Ht 67" (170.2 cm)   Wt 320 lb (145.151 kg)   BMI 50.11   kg/m2   SpO2 97%   LMP 09/16/2014 Constitutional:  Well developed, Well   nourished, No acute distress, Non-toxic appearance. HENT:  Normocephalic,   Atraumatic, Bilateral external ears normal, Oropharynx moist, No oral   exudates, Nose normal. Neck- Normal range of motion, No tenderness, Supple, No   stridor. Respiratory:  Normal breath sounds, No respiratory distress, No   wheezing, No chest tenderness. Cardiovascular:  Normal heart rate, Normal   rhythm, No murmurs, No rubs, No gallops. GI:  Bowel sounds normal, Soft,   midepigastric abdominal tenderness no rebound tenderness no abdominal   distention no guarding peritoneal signs no pain anywhere else in the abdomen   except for the midepigastric region, No masses, No pulsatile masses.   Musculoskeletal:  Intact distal pulses, No edema, No tenderness, No cyanosis,   No clubbing. Good range of motion in all major joints. No tenderness to   palpation or major deformities noted. Back- No tenderness. Integument:  Warm,   Dry, No erythema, No rash. Neurologic:  Alert & oriented x 3, Normal motor   function, Normal sensory function, No focal deficits noted. Psychiatric:    Affect normal,  Judgment normal, Mood normal.     EKG     LABORATORY Recent Results (from the past 24 hour(s)) BMP   (Na,K,Cl,CO2,Glu,BUN,Creat,Ca)  Collection Time: 09/16/14  1:50 AM Angelica Jones   Value Ref Range  BLD UREA NITROGEN 7 (L) 8 - 26 mg/dL  SODIUM 141 135 - 145   mEq/L  POTASSIUM 3.1 (L) 3.6 - 5.1 mEq/L  CHLORIDE 105 101 - 111 mEq/L  CO2 25   24 - 36 mEq/L  GLUCOSE, RANDOM 78 74 - 99 mg/dL  CREATININE 0.71 0.44 - 1.03   mg/dL  OSMOLALITY CALC 278 (L) 280 - 300 mOSM/kg  CALCIUM 9.0 8.5 - 10.5 mg/dL    GFR NON-AFRICAN AMER >60 >60 mL/min/1.73 sq meter  GFR AFRICAN AMERICAN >60   >60 mL/min/1.73 sq meter Lipase  Collection Time: 09/16/14  1:50 AM Angelica Jones   Value Ref Range  LIPASE 20 (L) 22 - 51 U/L Hepatic Function Panel   (TBIL,DBIL,ALK,AST,ALT,TP,ALB)  Collection Time: 09/16/14  1:50 AM Angelica Jones   Value Ref Range  ALK PHOSPHATASE 89 35 - 135 IU/L  TOTAL PROTEIN 6.9 6.0 - 8.0   g/dL  ALBUMIN 4.1 3.5 - 5.0 g/dL  AST 38 10 - 40 IU/L  ALT 81 (H) 10 - 60   IU/L  TOTAL BILIRUBIN 2.2 (H) 0.0 - 1.2 mg/dL  DIRECT BILIRUBIN 0.4 (H) 0.0 -   0.2 mg/dL Urinalysis (UAS), STAT  Collection Time: 09/16/14  2:15 AM  Angelica Jones   Value Ref Range  UA SPECIMEN SOURCE Angelica Jones  COLOR YELLOW YELLOW  APPEARANCE,   URINE CLEAR CLEAR  PH, URINE 6.5 5.0 - 8.0  SPEC. GRAVITY, URINE 1.019 1.005 -   1.029  PROTEIN, URINE 1+ (A) NEGATIVE  GLUCOSE, URINE NEGATIVE NEGATIVE    KETONE, URINE 1+ (A) NEGATIVE  BILIRUBIN, URINE NEGATIVE NEGATIVE  BLOOD,   URINE 3+ (A) NEGATIVE  UROBILINOGEN, URINE <2.0 <2.0 mg/dL  NITRITE, URINE   NEGATIVE NEGATIVE  LEUKOCYTE ESTERASE, URINE 1+ (A) NEGATIVE Urine Microscopic    Collection Time: 09/16/14  2:15 AM Angelica Jones Value Ref Range  RBC, URINE 42 (H)   0 - 5 /HPF  WBC, URINE 5 (H) 0 - 3 /HPF  SQUAMOUS EPI CELLS 11 /HPF  MUCUS   PRESENT CBC with Differential  Collection Time: 09/16/14  2:33 AM Angelica Jones Value   Ref Range  WBC 10.2 3.6 - 10.5 THOU/mcL  RBC 4.74 3.80 - 5.20 MIL/mcL    HEMOGLOBIN 13.3 12.0 - 15.2 g/dL  HEMATOCRIT 40 36 - 46 %   MCV 85 82 - 97 fL    MCH 28 27 - 33 pg  MCHC 33 32 - 36 g/dL  RDW 16.4 (H) <15.3 %  PLATELET 361   140 - 375 THOU/mcL  ABS. NEUTROPHIL 6.72 1.80 - 7.70 THOU/mcL  ABS LYMPHS 2.60   1.00 - 4.00 THOU/mcL  ABS MONOS 0.62 0.20 - 0.90 THOU/mcL  ABS EOS 0.20 0.03   - 0.45 THOU/mcL  ABS BASOS 0.05 0.01 - 0.07 THOU/mcL  SEGS 65 %  LYMPHOCYTES   26 %  MONOCYTES 6 %  EOSINOPHIL 2 %  BASOPHILS 1 %     RADIOLOGY No orders to display     PROCEDURES     ED COURSE & MEDICAL DECISION MAKING Pertinent Labs & Imaging studies   reviewed. (See chart for details) Patient's evaluated in the ER by myself and   Dr. Starlyn Skeans, patient presents to the ER with complaints of midepigastric   abdominal pain which is worse with eating that started following a gastric   bypass procedure last month patient has stable vital signs here in the ER is   afebrile, patient did have some midepigastric abdominal pain but no pain   anywhere else in the abdomen, patient had a potassium of 3.1 otherwise patient   had an unremarkable metabolic panel lipase was 20 patient had stable LFTs   compared to her previous testing, patient had negative UA for urinary tract   infection patient had an plus leukocytes but patient had 11epithelial cells   patient had a negative white count 10.2 with a stable H&H. Patient was given   potassium here in the ER patient was a difficult IV stick and the or unable to   get an IV to stay and patient was however able to drink fluids following the   ability drink fluids patient states she did feel better, patient had a   negative orthostatic blood vital signs done in the ER. Patient will be   discharged home with prescription for Carafate instructed to follow-up with   primary care physician as well as Dr. Jeanne Ivan patient states that she   understands above diagnosis and treatment plan patient is discharged home in   stable condition. Do not feel the presents with any obstructions, as the   patient has not had any vomiting or nausea just  pain with food  patient's pain   is most likely secondary  to gastritis.     The patient was given the following medication in the Emergency department:     Medication Administration from 09/16/2014 0039 to 09/16/2014 0350      Date/Time Order Dose Route Action Action by Comments   09/16/2014 0305   potassium chloride SA (K-DUR,KLOR-CON) CR tablet 40 mEq 40 mEq Oral Given   Carmin Muskrat         FINAL DIAGNOSIS:     1. Gastritis         The patient was given the following medications to go home with.     New Prescriptions  SUCRALFATE (CARAFATE) 1 G TABS    Take 1 tablet by mouth 4   (four) times daily before meals and nightly.     dr Starlyn Skeans  spent face to face time with the patient and agrees with above   diagnosis and treatment.     Tessa Lerner PA-C             Tessa Lerner, PA-C 09/16/14 [id number]     Serita Sheller, MD 09/16/14 0411         _________________________________  Signed by:  Thera Flake    D: 09/16/2014 04:11 AM  T: 09/16/2014 04:11 AM    This document is confidential medical information.  Unauthorized disclosure or   use of this information is prohibited by law.  If you are not the intended recipient of this document, please advise [place] by   calling immediately [phone].

## 2014-09-30 NOTE — Other (Unsigned)
[facility] ED Encounter Arrival Date: 09/30/14 [place]                            DOB: Jun 24, 1985 [address]   Angelica Jones [facility] [address] MRN: [id number] CSN: [id number]     HAR: [id number]     EMERGENCY DEPARTMENT VISIT     CHIEF COMPLAINT Chief Complaint Patient presents with   Abdominal Pain     recent elevated bilirubin   Vomiting     HPI Angelica Jones is a 29 year old female who presents which he has severe   epigastric abdominal pain as well as nausea vomiting started today patient did   have a gastric bypass with a Roux-en-Y done in March she reports that she has   had an elevated bilirubin recently and states that her bowel movements are   now yellow she reports no blood in the emesis today which everything striking   her drink seemed to come up she denies any diarrhea denies any fevers chills   trauma she reports the pain is not radiating denies any alcohol use and is   taking her medication as prescribed. She did have an upper GI evaluation done   earlier this week which was negative as well     REVIEW OF SYSTEMS Constitutional:  Denies fever, chills, weight loss or   weakness Eyes:  Denies photophobia or discharge or visual disturbance HENT:    Denies sore throat or ear pain or nasal congestion Respiratory:  Denies cough   or shortness of breath Cardiovascular:  Denies chest pain, palpitations or   swelling Musculoskeletal:  Denies back pain Skin:  Denies rash Neurologic:    Denies headache, focal weakness or sensory changes Endocrine:  Denies polyuria   or polydypsia Lymphatic:  Denies swollen glands Psychiatric:  Denies   depression, suicidal ideation or homicidal ideation     All other review of systems negative except what stated above     PAST MEDICAL HISTORY Past Medical History Diagnosis Date   Acute pancreatitis     Anemia   Bipolar affective disorder ([place])   Dr. [name]   Cellulitis and   abscess of leg   recurrent cellulitis. on PCN every couple weeks    Depressive   disorder, not elsewhere classified   Edema   Hypertension   Lymph edema     Morbid obesity ([place])   PCOS (polycystic ovarian syndrome)   [name] [name]     Unspecified essential hypertension     SURGICAL HISTORY Past Surgical History Procedure Laterality Date   Carpal   tunnel release Left   Cholecystectomy with/without common duct exploration    2003   [facility]   Arthroscopy knee Right   lateral release   Gastric bypass     CURRENT MEDICATIONS Prior to Admission medications Medication Sig Start Date   End Date Taking? Authorizing Provider sucralfate (CARAFATE) 1 G TABS Take 1   tablet by mouth 4 (four) times daily before meals and nightly. 09/16/14     Tessa Lerner, PA-C promethazine (PHENERGAN) 25 MG TABS Take 1 tablet by   mouth every 6 (six) hours as needed. 06/14/14   Tymitz, [name], MD   Multiple Vitamin (MULTI-VITAMIN DAILY PO) Take  by mouth daily.    HISTORICAL   MED Penicillin G Benzathine & Proc (BICILLIN C-R, 1200000,) 1200000 UNIT/2ML   SUSP by Intramuscular route every  21 days.    HISTORICAL MED doxycycline   (DORYX) 100 MG CPEP Take 100 mg by mouth 2 (two) times daily. HISTORICAL MED   fexofenadine (ALLEGRA) 180 MG TABS Take 180 mg by mouth 3 (three) times daily.   Outpatient prescriber recommended increase dose for patient's increased   weight. He does want fexofenadine 172m TID.  Patient breaks out in hives   really easily. This helps to prevent/treat hives (especially on the face).      HISTORICAL MED famotidine (PEPCID) 40 MG TABS Take 1 tablet by mouth 2 (two)   times daily. Patient taking differently: Take 40 mg by mouth 2 (two) times   daily. For severe allergies/hives 10/12/13   EDutch Quint, MD EPINEPHrine   (EPIPEN 2-PAK) 0.3 MG/0.3ML SOAJ Inject 0.3 mg as needed. 10/12/13 EDutch Quint, MD albuterol 108 (90 BASE) MCG/ACT AERS Use 2 puffs every 4 (four)   hours as needed. 10/12/13   EDutch Quint, MD lisinopril   (PRINIVIL,ZESTRIL) 10 MG TABS Take 1 tablet by  mouth daily. 10/10/13 NRosita Fire MD     ALLERGIES Allergies Allergen Reactions   Clindamycin Anaphylaxis   Prednisone   Other (See Comments)   States any steroids pancreatitis   Betadine [Povidone   Iodine] Hives and Rash     FAMILY HISTORY Family History Problem Relation Age of Onset   pulm embolus   [OTHER] Sister   pcos   Other [OTHER] Sister   Hypertension Mother   Seizures   Mother   Diabetes Father   Hypertension Father   Other [OTHER] Father   Cancer   Maternal Grandmother   uterine     SOCIAL HISTORY Social History     Social History   Marital status: Single   Spouse name: N/A   Number of   children: N/A   Years of education: N/A     Occupational History    OProducer, television/film/video    Social History Main Topics   Smoking status: Never Smoker   Smokeless   tobacco: Never Used   Alcohol use 0.5 oz/week   1 Shots of liquor per week      Comment: socially  1 x month   Drug use: No   Sexual activity: Not Asked     Other Topics Concern   Weight Concern Yes   Special Diet Yes   Exercise Yes     Seat Belt Yes   Self-Exams Yes     Social History Narrative     PHYSICAL EXAM VITAL SIGNS: Visit Vitals   BP 155/70   Pulse 50   Temp 98.2  F   (36.8  C)   Resp 16   Ht 67" (170.2 cm)   Wt 305 lb (138.3 kg)   LMP   09/16/2014   SpO2 100%   BMI 47.77 kg/m2     Constitutional:  Well developed, well nourished, no acute distress, non-toxic   appearance Eyes:  PERRL, conjunctiva normal, EOMI HENT:  Normal cephalic,   Atraumatic, external ears normal, nose normal, oropharynx moist. Pharynx   clear.  Neck supple with no masses, JVD, or carotid bruits Respiratory:  No   respiratory distress, normal breath sounds, no rales, rhonchi, wheezing   Cardiovascular:  Normal rate, normal rhythm, no murmurs, no gallops, no rubs   GI: Epigastric tenderness no rebound or guarding positive bowel sounds are   noted Musculoskeletal:  No edema, no tenderness, no deformities Integument:  Well hydrated, no rashes, lesions, petechia Neurologic:  Alert &  oriented x 3,   no focal deficits, MS 5/5, speech clear, Sensation intact to light touch   Psychiatric:  Affect normal, Judgment normal, Mood norma     EKG:     LABORATORY:     Recent Results (from the past 24 hour(s)) Amylase  Collection Time: 09/30/14    3:31 PM Trebor Galdamez Value Ref Range  AMYLASE 32 28 - 100 U/L CBC with Differential    Collection Time: 09/30/14  3:31 PM Finnley Larusso Value Ref Range  WBC 8.5 3.6 -   10.5 THOU/mcL  RBC 4.94 3.80 - 5.20 MIL/mcL  HEMOGLOBIN 14.0 12.0 - 15.2 g/dL    HEMATOCRIT 41 36 - 46 %  MCV 83 82 - 97 fL  MCH 28 27 - 33 pg  MCHC 34 32 -   36 g/dL  RDW 15.1 <15.3 %  PLATELET 284 140 - 375 THOU/mcL  ABS. NEUTROPHIL   5.40 1.80 - 7.70 THOU/mcL  ABS LYMPHS 2.40 1.00 - 4.00 THOU/mcL  ABS MONOS   0.50 0.20 - 0.90 THOU/mcL  ABS EOS 0.10 0.03 - 0.45 THOU/mcL  ABS BASOS 0.10   (H) 0.01 - 0.07 THOU/mcL  SEGS 63 %  LYMPHOCYTES 28 %  MONOCYTES 6 %    EOSINOPHIL 2 %  BASOPHILS 1 % Hepatic Function Panel   (TBIL,DBIL,ALK,AST,ALT,TP,ALB)  Collection Time: 09/30/14  3:31 PM Arvle Grabe   Value Ref Range  ALK PHOSPHATASE 88 35 - 135 IU/L  TOTAL PROTEIN 7.4 6.0 - 8.0   g/dL  ALBUMIN 4.3 3.5 - 5.0 g/dL  AST 33 10 - 40 IU/L  ALT 67 (H) 10 - 60   IU/L  TOTAL BILIRUBIN 1.9 (H) 0.0 - 1.2 mg/dL  DIRECT BILIRUBIN 0.3 (H) 0.0 -   0.2 mg/dL Lipase  Collection Time: 09/30/14  3:31 PM Kaeley Vinje Value Ref Range    LIPASE 20 (L) 22 - 51 U/L POCT Metabolic Panel - ISTAT  Collection Time:   09/30/14  3:35 PM Lucie Friedlander Value Ref Range  GLUCOSE 87 74 - 99 mg/dL  BLD UREA   NITROGEN 6 (L) 8 - 26 mg/dL  CREATININE 0.7 0.4 - 1.0 mg/dL  SODIUM 144 135 -   145 mEq/L  POTASSIUM 3.6 3.6 - 5.1 mEq/L  CHLORIDE 105 101 - 111 mEq/L  CO2   TOTAL 22 (L) 24 - 36 mEq/L  IONIZED CALCIUM 4.9 4.6 - 5.4 mg/dL    HEMOGLOBIN,CALC 14.3 12.0 - 15.2 g/dL  HEMATOCRIT 42 36 - 46 %PCV POCT   URINALYSIS  Collection Time: 09/30/14  3:38 PM Lakeithia Rasor Value Ref Range    COLOR,POC URINE STRAW (A) YELLOW  CLARITY,POC URINE SLIGHTLY CLOUDY (A) CLEAR    PH,POC URINE 6.0  5.0 - 8.0  SPEC GRAVITY POC UR 1.020 1.005 - 1.029    PROTEIN,POC URINE TRACE (A) NEGATIVE  GLUCOSE,POC URINE NEGATIVE NEGATIVE    KETONES,POC URINE NEGATIVE NEGATIVE  BILIRUBIN,POC URINE NEGATIVE NEGATIVE    BLOOD,POC URINE 3+ (A) NEGATIVE  UROBILINOGEN,POC URINE 0.2 <2.0  NITRITE,POC   URINE NEGATIVE NEGATIVE  LEUK ESTERASE,POC URN 1+ (A) NEGATIVE POCT GONADOT   HCG QUALITATIVE  Collection Time: 09/30/14  3:39 PM Yaakov Saindon Value Ref Range    HCG,POC URINE NEGATIVE NEGATIVE     Radiology:     CT ABDOMEN PELVIS W CONTRAST Final Kissie Ziolkowski     Mild nonspecific hepatosplenomegaly, in retrospect stable since 09/12/2013.     Expected postop changes of gastric bypass  surgery.             PLAN AND MANGEMENT:     The patient was given the following medication in the Emergency department:     Medication Administration from 09/30/2014 1444 to 09/30/2014 1755      Date/Time Order Dose Route Action Action by Comments   09/30/2014 1542 sodium   chloride 0.9% infusion 999 mL/hr Intravenous New Bag Hart Carwin [facility]     09/30/2014 1748 sodium chloride 0.9% infusion 0 mL/hr Intravenous Stopped   Hart Carwin [facility]   09/30/2014 1542 morphine injection 4 mg 4 mg Intravenous Not   Given Serita Grammes   09/30/2014 1543 ondansetron (ZOFRAN) injection 4 mg 4 mg   Intravenous Given Hart Carwin Guard   09/30/2014 1543 famotidine (PEPCID)   intravenous injection 20 mg 20 mg Intravenous Given Hart Carwin Guard   09/30/2014   1642 ioversol (OPTIRAY 350) 74 % injection 75 mL 75 mL Intravenous Contrast   Given Karen  Christopfel         I did discuss the case with surgery the patient did have a scan obtained   because she is 2 months postop of a gastric bypass the scan was negative   laboratory studies unremarkable except for a mild elevation of bilirubin   surgery that of the patient she did receive medication for pain and nausea as   well as fluids she is resting, with this time surgery does not feel she is a   candidate for admission the patient in outpatient  endoscopy they will set her   up for this they will increase her Pepcid she is given prescription   antiemetics for myself and given dietary instructions     Patient was instructed on Culture results and preliminary radiology reads and   that she would be contacted if a variance or positive test is obtained.     The nursing note was reviewed, agreed and appreciated.     Final Diagnosis:     1. Vomiting in adult         Patient was prescribe the medication below:     Discharge Medication List as of 09/30/2014  5:48 PM     START taking these medications  Details ondansetron (ZOFRAN-ODT) 4 MG TBDP   Take 1 tablet by mouth every 8 (eight) hours as needed.Print, Disp-20 tablet,   R-0     !! promethazine (PHENERGAN) 25 MG TABS Take 1 tablet by mouth every 6 (six)   hours as needed.Print, Disp-30 tablet, R-0      !! - Potential duplicate medications found. Please discuss with provider.         Follow-up Information  Follow up With Details Comments Contact Info  Tymitz,   [name], MD Call in 1 day  [address] [place] [address]   [phone]                         [name], MD 09/30/14 1912         _________________________________  Signed by:  Jethro Poling    D: 09/30/2014 03:10 PM  T: 09/30/2014 03:10 PM    This document is confidential medical information.  Unauthorized disclosure or   use of this information is prohibited by law.  If you are not the intended recipient of this document, please advise [place] by   calling immediately [phone].

## 2014-09-30 NOTE — Other (Unsigned)
CURRENT STATUS IS EMERGENCY .      :     Any questions regarding PATIENT CLASS/STATUS should be directed to the Care   Management Departments: [facility] [phone] or [facility] [phone] or [facility]  [phone].         _________________________________  Signed byJamison Neighbor:    [facility]  NOTIFY    ZZ    D: 09/30/2014 02:44 PM  T:    This document is confidential medical information.  Unauthorized disclosure or   use of this information is prohibited by law.  If you are not the intended recipient of this document, please advise us by   calling immediately [phone].

## 2014-09-30 NOTE — Other (Unsigned)
------------------------------------------------------------------------------  -- Attestation signed by Ihor AustinGuend, Hamza, MD at 10/01/14 [phone] I did not   personally see the patient and discussed the case with the residents and the   above plans seems reasonable.     Ihor AustinHamza Guend, M.D. Colorectal Surgery [facility]       ------------------------------------------------------------------------------  --         Department of Surgery Consult Note     Patient: Angelica Jones Date of Birth: 08/25/1985 MRN: [id number] Age: 29   year old Gender: female     Admitting Physician: Primary Care Physician: Magdalen Spatzussey, Rocio G, MD Admission   type: Emergency [facility] Room: 15/15     Date of Admission: 09/30/2014     Reason for Admission: There are no admission diagnoses documented for this   encounter.     Date of consultation: 09/30/2014     Physician requesting consutlation: Burt KnackLifshitz     Reason for consultation: abdominal pain s/p RYGB     HPI: Angelica Jones is a 29 year old female h/o HTN, PCOS, obesity, depression,   bipolar disorder s/p RYGB in 07/2014 who presents with epigastric abdominal   pain, N/V which started today. It is associated with "yellow" stools. She   states she is unable to hold down food/water. She denies any diarrhea, F/C,   trauma. She denies any alcohol or tobacco use and is taking her medication as   prescribed. She was having similar pain earlier this week at which time she   did have an upper GI study, which was negative.     Past Medical History: Past Medical History Diagnosis Date   Acute   pancreatitis   Anemia   Bipolar affective disorder (HCC)   Dr. Marnette BurgessAziz     Cellulitis and abscess of leg   recurrent cellulitis. on PCN every couple   weeks   Depressive disorder, not elsewhere classified   Edema   Hypertension     Lymph edema   Morbid obesity (HCC)   PCOS (polycystic ovarian syndrome)     Angelique Blonderenise [name]   Unspecified essential hypertension     Past Surgical  History: Past Surgical History Procedure Laterality Date     Carpal tunnel release Left   Cholecystectomy with/without common duct   exploration  2003   [facility]   Arthroscopy knee Right   lateral release   Gastric   bypass     Medications: Prior to Admission medications Medication Sig Start Date End   Date Taking? Authorizing Provider sucralfate (CARAFATE) 1 G TABS Take 1 tablet   by mouth 4 (four) times daily before meals and nightly. 09/16/14   Albertine GratesBryant,   Donald, PA-C promethazine (PHENERGAN) 25 MG TABS Take 1 tablet by mouth every   6 (six) hours as needed. 06/14/14   Tymitz, Humberto SealsKevin Michael, MD Multiple Vitamin   (MULTI-VITAMIN DAILY PO) Take  by mouth daily.    HISTORICAL MED Penicillin G   Benzathine & Proc (BICILLIN C-R, 1200000,) 1200000 UNIT/2ML SUSP by   Intramuscular route every 21 days.    HISTORICAL MED doxycycline (DORYX) 100   MG CPEP Take 100 mg by mouth 2 (two) times daily. HISTORICAL MED fexofenadine   (ALLEGRA) 180 MG TABS Take 180 mg by mouth 3 (three) times daily. Outpatient   prescriber recommended increase dose for patient's increased weight. He does   want fexofenadine 180mg  TID.  Patient breaks out in hives really easily. This   helps to prevent/treat hives (especially  on the face).    HISTORICAL MED   famotidine (PEPCID) 40 MG TABS Take 1 tablet by mouth 2 (two) times daily.   Patient taking differently: Take 40 mg by mouth 2 (two) times daily. For   severe allergies/hives 10/12/13   Ladene ArtistEntis, Gregory N., MD EPINEPHrine (EPIPEN   2-PAK) 0.3 MG/0.3ML SOAJ Inject 0.3 mg as needed. 10/12/13 Ladene ArtistEntis, Gregory N.,   MD albuterol 108 (90 BASE) MCG/ACT AERS Use 2 puffs every 4 (four) hours as   needed. 10/12/13   Ladene ArtistEntis, Gregory N., MD lisinopril (PRINIVIL,ZESTRIL) 10 MG   TABS Take 1 tablet by mouth daily. 10/10/13 Melvern BankerNoble, Gregory L, MD     Allergies: Allergies Allergen Reactions   Clindamycin Anaphylaxis     Prednisone Other (See Comments)   States any steroids pancreatitis   Betadine   [Povidone Iodine] Hives and  Rash     Social History: Social History Substance Use Topics   Smoking status: Never   Smoker   Smokeless tobacco: Never Used   Alcohol use 0.5 oz/week   1 Shots of   liquor per week    Comment: socially  1 x month     Family History: Family History Problem Relation Age of Onset   pulm embolus   [OTHER] Sister   pcos   Other [OTHER] Sister   Hypertension Mother   Seizures   Mother   Diabetes Father   Hypertension Father   Other [OTHER] Father   Cancer   Maternal Grandmother   uterine     Family history significant for No GI Malignancies negative for bleeding or   clotting disorders.     ROS: Constitutional: none ENT: no hearing loss, tinnitus, vertigo, nosebleed,   nasal congestion, rhinorrhea, sore throat Respiratory: no cough, pleuritic   chest pain, dyspnea, or wheezing Cardiac: no angina, DOE, orthopnea, PND,   palpitations, or claudication GI: abdominal pain, N/V GU: no urinary urgency,   frequency, dysuria, nocturia, hesitancy, or incontinence Skin: no abnormal   pigmentation, rash, scaling, itching, masses, hair or nail changes   Musculoskeletal: no arthritis, arthralgia, myalgia, weakness, or morning   stiffness Neuro: no paralysis, paresis, paresthesia, seizures, tremors, or   headaches     PHYSICAL EXAM: Vitals:  09/30/14 1446 BP: 156/84 Pulse: 53 Resp: 16 Temp:   98.2  F (36.8  C) SpO2: 100%     BMI: Body mass index is 47.77 kg/(m^2).     General/Appearance: to be in no distress, alert, cooperative HEENT:   Normocephalic, without obvious abnormality Lung: Respirations unlabored   Breast: Deferred Cardiac: Regular rate and rhythm Abdomen: obese, soft, mildly   tender in epigastrum, no rebound or guarding Rectal: deferred Incision: well   healed Extremities: No evidence of DVT seen on physical exam. Neuro: normal   without focal findings Skin: no rashes     Labs: BMP: Lab Results Component Value Date/Time  CRET 0.7 09/30/2014 03:35   PM     CBC: Lab Results Component Value Date/Time  WBC 8.5 09/30/2014  03:31 PM  HGB   14.0 09/30/2014 03:31 PM  HCT 42 09/30/2014 03:35 PM  HCT 41 09/30/2014 03:31   PM  PLT 284 09/30/2014 03:31 PM     CBC/COAG: Recent Labs    09/30/14  1531  09/30/14  1535 WBC  8.5   -- HGB    14.0   -- HCT  41  42 PLT  284   --     BMP: Recent Labs  09/30/14  1535 CRET  0.7     LFT/Panc: Recent Labs    09/30/14  1531 TP  7.4 ALB  4.3 AST  33 ALT  67*     Urine studies: Lab Results Component Value Date/Time  EOS 2 09/30/2014 03:31   PM     Cultures:     Imaging Studies: CT ABDOMEN PELVIS W CONTRAST Final Angelica Jones     Mild nonspecific hepatosplenomegaly, in retrospect stable since 09/12/2013.     Expected postop changes of gastric bypass surgery.             Problem List: Principal Problem:   Abdominal pain POA: Yes Active Problems:     Bipolar I disorder, most recent episode (or current) depressed, severe,   specified as with psychotic behavior (HCC) POA: Yes   GERD without esophagitis   POA: Yes Resolved Problems:   * No resolved hospital problems. *     ASSESSMENT / PLAN     Angelica Jones is a 29 year old woman h/o HTN, depression, bipolar, GERD s/p RYGB   in 07/2014 who presents with epigastric abdominal pain, N/V. She is AFVSS. Lab   work is significant for elevated bilirubin 1.9 down from 2.2 earlier this   week. No leukocytosis. Her albumin is 4.3. Her CT scan was largely   unremarkable. UGI on 5/4 was negative for leak.     Abdominal Pain - likely functional pain related to surgery - increase pepcid   to  BID - send home with zofran/phenergan - IVF bolus - OK for discharge   from ED, f/u with Dr. Kae Heller in office if symptoms continue     Carney Bern, MD 09/30/2014 [facility] Resident   Service Pager [phone]             _________________________________  Signed by:  <ORC.11.3>KATELYN</ORC.11.3><ORC.11.3>HAMZA</ORC.11.3> <ORC.11.4/><ORC.11.4/>   <ORC.11.2>MELLION</ORC.11.2><ORC.11.2>GUEND</ORC.11.2> <ORC.11.7/><ORC.11.7/>  KATELYN  MELLION    10    D: 09/30/2014 05:05  PM  T: 09/30/2014 05:05 PM    This document is confidential medical information.  Unauthorized disclosure or   use of this information is prohibited by law.  If you are not the intended recipient of this document, please advise [place] by   calling immediately [phone].

## 2014-10-03 ENCOUNTER — Ambulatory Visit
Admit: 2014-10-03 | Discharge: 2014-10-03 | Payer: PRIVATE HEALTH INSURANCE | Attending: Family Medicine | Primary: Family Medicine

## 2014-10-03 DIAGNOSIS — R1013 Epigastric pain: Secondary | ICD-10-CM

## 2014-10-03 NOTE — Progress Notes (Signed)
Subjective:      Patient ID: Angelica ReinLisa Blackham is a 29 y.o. female.    Abdominal Pain  This is a recurrent problem. The current episode started 1 to 4 weeks ago. The onset quality is gradual. The problem occurs intermittently. The problem has been unchanged. The pain is located in the generalized abdominal region and epigastric region. The pain is moderate. The quality of the pain is colicky. The abdominal pain radiates to the back. Associated symptoms include anorexia, diarrhea, nausea and weight loss. Pertinent negatives include no arthralgias, belching, constipation, dysuria, fever, flatus, frequency, headaches, hematochezia, hematuria, melena, myalgias or vomiting. The pain is aggravated by eating. The pain is relieved by nothing. She has tried nothing for the symptoms. Prior diagnostic workup includes CT scan (barium swallow, labs). Her past medical history is significant for pancreatitis. There is no history of gallstones. gastric bypass   Diarrhea   This is a new problem. The current episode started 1 to 4 weeks ago. The problem occurs 2 to 4 times per day. The problem has been unchanged. The stool consistency is described as watery. The patient states that diarrhea does not awaken her from sleep. Associated symptoms include abdominal pain and weight loss. Pertinent negatives include no arthralgias, fever, headaches, increased  flatus, myalgias or vomiting. Exacerbated by: eating,  There are no known risk factors. She has tried anti-motility drug for the symptoms. The treatment provided mild relief. There is no history of inflammatory bowel disease. gastric bypass       Review of Systems   Constitutional: Positive for weight loss, appetite change and fatigue. Negative for fever, diaphoresis and activity change.   HENT: Negative for postnasal drip, sinus pressure, sore throat and trouble swallowing.    Respiratory: Negative for chest tightness, shortness of breath and wheezing.    Cardiovascular: Negative for chest  pain and palpitations.   Gastrointestinal: Positive for nausea, abdominal pain, diarrhea and anorexia. Negative for vomiting, constipation, blood in stool, melena, hematochezia, abdominal distention, anal bleeding, rectal pain and flatus.        Jaundice  Yellow stools     Genitourinary: Negative for dysuria, urgency, frequency, hematuria, decreased urine volume, vaginal bleeding, vaginal discharge, difficulty urinating, vaginal pain and pelvic pain.   Musculoskeletal: Negative for myalgias, back pain, arthralgias and gait problem.   Skin: Positive for color change. Negative for pallor.   Neurological: Negative for dizziness, light-headedness and headaches.   Hematological: Negative for adenopathy.   Psychiatric/Behavioral: Positive for sleep disturbance. The patient is nervous/anxious.        is allergic to clindamycin/lincomycin; metformin and related; prednisone; and victoza.      Current outpatient prescriptions:   ???  lisinopril (PRINIVIL;ZESTRIL) 10 MG tablet, Take 1 tablet by mouth daily, Disp: 90 tablet, Rfl: 1  ???  citalopram (CELEXA) 20 MG tablet, Take 1 tablet by mouth daily, Disp: 90 tablet, Rfl: 1  ???  LORazepam (ATIVAN) 1 MG tablet, Take 1 tablet by mouth daily as needed for Anxiety, Disp: 15 tablet, Rfl: 0  ???  ondansetron (ZOFRAN) 8 MG tablet, Take 1 tablet by mouth 3 times daily as needed for Nausea or Vomiting, Disp: 21 tablet, Rfl: 0  ???  diphenhydrAMINE (BENADRYL) 25 MG capsule, Take 1 capsule by mouth every 4 hours as needed for Itching WARNING: May cause drowsiness.  May impair ability to operate a motor vehicle or machinery.  Do not use in combination with alcohol., Disp: 12 capsule, Rfl: 0  ???  traMADol (ULTRAM) 50  MG tablet, Take 1 tablet by mouth 2 times daily, Disp: 30 tablet, Rfl: 0  ???  famotidine (PEPCID) 40 MG tablet, Take 40 mg by mouth 2 times daily, Disp: , Rfl:   ???  Fexofenadine-Pseudoephedrine (ALLEGRA-D 24 HOUR PO), Take by mouth 3 times daily, Disp: , Rfl:   ???  doxycycline (VIBRAMYCIN)  100 MG capsule, Take 100 mg by mouth 2 times daily, Disp: , Rfl:   ???  Penicillin G Benzathine & Proc (BICILLIN C-R IM), Inject into the muscle, Disp: , Rfl:   ???  neomycin-polymyxin-dexameth 3.5-10000-0.1 OINT, Place into both eyes daily, Disp: , Rfl:      has a past medical history of Hypertension; Anxiety; Depression; Bipolar disorder (HCC); Polycystic ovarian disease; Cellulitis; Lymphedema; Chronic back pain; Urticaria; Pancreatitis; Hiatal hernia; Blepharitis of both eyes; and Morbid obesity with BMI of 50.0-59.9, adult (HCC).    Past Surgical History   Procedure Laterality Date   ??? Cholecystectomy     ??? Carpal tunnel release Left    ??? Knee surgery Right         reports that she has never smoked. She does not have any smokeless tobacco history on file. She reports that she drinks alcohol. She reports that she does not use illicit drugs.    family history includes Diabetes in her father; High Blood Pressure in her father and mother; Kidney Disease in her father; Seizures in her mother.      Objective:   Physical Exam   Constitutional: She is oriented to person, place, and time. She appears well-developed and well-nourished. No distress.   Morbidly obese WF     HENT:   Head: Normocephalic and atraumatic.   Mouth/Throat: Oropharynx is clear and moist. No oropharyngeal exudate.   Eyes: Conjunctivae and EOM are normal. Pupils are equal, round, and reactive to light. No scleral icterus (no jaundice on exam).   Neck: Normal range of motion. Neck supple. No thyromegaly present.   Cardiovascular: Normal rate, regular rhythm and normal heart sounds.    No murmur heard.  Pulmonary/Chest: Effort normal and breath sounds normal. No respiratory distress. She has no wheezes.   Abdominal: Soft. Bowel sounds are normal. She exhibits no distension and no mass. There is tenderness. There is no rebound and no guarding.   Mild epigastric ttp     Lymphadenopathy:     She has no cervical adenopathy.   Neurological: She is alert and  oriented to person, place, and time. No cranial nerve deficit. She exhibits normal muscle tone. Coordination normal.   Skin: Skin is warm. She is not diaphoretic. No pallor.   Psychiatric: She has a normal mood and affect. Her behavior is normal.   Nursing note and vitals reviewed.      Assessment:      1. Epigastric pain  Ambulatory referral to Gastroenterology    CT ABDOMEN PELVIS W WO IV CONTRAST    Hepatic Function Panel   2. Diarrhea  C DIFF TOXIN/ANTIGEN    Ambulatory referral to Gastroenterology    CT ABDOMEN PELVIS W WO IV CONTRAST    Hepatic Function Panel            Plan:      Needs ct with oral contrast   Labs and previous ct/barium test discussed with her no conclusive for current sx     1. Referral to gi for eval  2. Diet discussed  We may need to continue imodium or try cholestyramine  Time face to face >25 minutes, 50% time spent in education and coordination of care  Topics discussed:   Medical condition, diff diagnoses, work up options, medication use/secondary effects/medication interactions, and life style changes

## 2014-10-17 NOTE — Progress Notes (Signed)
Physical Therapy      Outpatient Physical Therapy DISCHARGE NOTE  Phone: [phone] Fax: [phone]     To:   Dr. Jackelyn Knife      Patient: Angelica Jones   DOB: 1985-07-13   MRN: [id number]  Evaluation Date: 10/17/2014      Diagnosis Information:  ??   Diagnosis: lymphedema 457.1, I89.0   ?? Treatment Diagnosis: swelling R >L LE   ??       Physical Therapy DISCHARGE NOTE  Will discharge pt at this time as she only particicpated in one f/u visit after PT eval.  No goals met due to decreased compliance with PT POC.      Plan of Care/Treatment to date:  '[x]'  Therapeutic Exercise    '[]'  Modalities:  '[x]'  Therapeutic Activity     '[]'  Ultrasound  '[]'  Electrical Stimulation  '[]'  Gait Training      '[]'  Cervical Traction '[]'  Lumbar Traction  '[x]'  Neuromuscular Re-education    '[]'  Cold/hotpack '[]'  Iontophoresis   '[x]'  Instruction in HEP     Other:  '[x]'  Manual Therapy- MLD     '[x]'      compression        '[x]'  Aquatic Therapy      '[]'            ?      Frequency/Duration:  # Days per week: '[]'  1 day # Weeks: '[]'  1 week '[]'  5 weeks     '[]'  2 days?   '[]'  2 weeks '[x]'  6 weeks     '[x]'  3 days   '[]'  3 weeks '[]'  7 weeks     '[]'  4 days   '[]'  4 weeks '[]'  8 weeks    Rehab Potential: '[]'  Excellent '[x]'  Good '[]'  Fair  '[]'  Poor   PLAN: D/C    Electronically signed by:  Garner Nash, PT [phone] DPT        If you have any questions or concerns, please don't hesitate to call.  Thank you for your referral.      Physician Signature:________________________________Date:__________________  By signing above, therapist???s plan is approved by physician

## 2014-10-24 ENCOUNTER — Encounter: Attending: Family Medicine | Primary: Family Medicine

## 2014-11-08 NOTE — Other (Unsigned)
[facility] Emergency Department ED PATIENT ENCOUNTER ARRIVAL: 11/08/14   1013 CSN: [id number]     HAR: [id number]     History     Chief Complaint Patient presents with   Head Injury   Headache     Patient is a 29 year old female presenting with head injury and headaches.   The history is provided by the patient. Head Injury The incident occurred 2   days ago. She came to the ER via walk-in. The injury mechanism was a fall.   There was no loss of consciousness. There was no blood loss. The quality of   the pain is described as dull. The pain is at a severity of 4/10. The pain is   moderate. The pain has been constant since the injury. Pertinent negatives   include no numbness, no blurred vision, no vomiting, no tinnitus, no   disorientation, no weakness and no memory loss. She has tried acetaminophen   for the symptoms. The treatment provided mild relief. Headache Primary   symptoms do not include headaches, memory loss, fever or vomiting. Additional   symptoms do not include weakness or tinnitus.     Past Medical History Diagnosis Date   Acute pancreatitis   Anemia   Anxiety     Bipolar affective disorder (HCC)   Dr. Marnette Burgess   Cellulitis and abscess of leg     recurrent cellulitis. on PCN every couple weeks   Depressive disorder, not   elsewhere classified   Edema   Hypertension   Lymph edema   Morbid obesity   (HCC)   PCOS (polycystic ovarian syndrome)   [name]   Unspecified   essential hypertension     Past Surgical History Procedure Laterality Date   Carpal tunnel release Left     Cholecystectomy with/without common duct exploration  2003   [facility]     Arthroscopy knee Right   lateral release   Gastric bypass   Knee surgery Right     Prior to Admission medications Medication Sig Start Date End Date Taking?   Authorizing Provider hyoscyamine (LEVSIN/SL) 0.125 MG SUBL 1 tablet by   Sublingual route 4 (four) times daily before meals and nightly. 10/18/14  Yes   Tadiparthi, Rashmi, MD ondansetron  (ZOFRAN-ODT) 4 MG TBDP Take 1 tablet by   mouth every 8 (eight) hours as needed. 09/30/14  Yes Lajuana Carry., MD   promethazine (PHENERGAN) 25 MG TABS Take 1 tablet by mouth every 6 (six) hours   as needed. 09/30/14  Yes Lajuana Carry., MD promethazine (PHENERGAN) 25 MG   TABS Take 1 tablet by mouth every 6 (six) hours as needed. 06/14/14  Yes   Tymitz, Humberto Seals, MD Multiple Vitamin (MULTI-VITAMIN DAILY PO) Take  by   mouth daily.   Yes HISTORICAL MED Penicillin G Benzathine & Proc (BICILLIN   C-R, 1200000,) 1200000 UNIT/2ML SUSP by Intramuscular route every 21 days.     Yes HISTORICAL MED famotidine (PEPCID) 40 MG TABS Take 1 tablet by mouth 2   (two) times daily. Patient taking differently: Take 40 mg by mouth 2 (two)   times daily. For severe allergies/hives 10/12/13  Yes Entis, [place] Nickel., MD   lisinopril (PRINIVIL,ZESTRIL) 10 MG TABS Take 1 tablet by mouth daily. 10/10/13   Yes Melvern Banker, MD sucralfate (CARAFATE) 1 G TABS Take 1 tablet by   mouth 4 (four) times daily before meals and nightly. 09/16/14 11/08/14  Albertine Grates, PA-C  doxycycline (DORYX) 100 MG CPEP Take 100 mg by mouth 2 (two)   times daily. 11/08/14  HISTORICAL MED fexofenadine (ALLEGRA) 180 MG TABS Take   180 mg by mouth 3 (three) times daily. Outpatient prescriber recommended   increase dose for patient's increased weight. He does want fexofenadine    TID.  Patient breaks out in hives really easily. This helps to prevent/treat   hives (especially on the face).  11/08/14  HISTORICAL MED EPINEPHrine (EPIPEN   2-PAK) 0.3 MG/0.3ML SOAJ Inject 0.3 mg as needed. 10/12/13 Ladene Artist.,   MD albuterol 108 (90 BASE) MCG/ACT AERS Use 2 puffs every 4 (four) hours as   needed. 10/12/13   Ladene Artist., MD     Allergies Allergen Reactions   Clindamycin Anaphylaxis   Prednisone Other   (See Comments)   States any steroids pancreatitis   Betadine [Povidone Iodine]   Hives and Rash   Morphine And Related Anxiety     Family History  Problem Relation Age of Onset   pulm embolus [OTHER] Sister     pcos   Other [OTHER] Sister   Hypertension Mother   Seizures Mother   Diabetes   Father   Hypertension Father   Other [OTHER] Father   Cancer Maternal   Grandmother   uterine     Social History Substance Use Topics   Smoking status: Never Smoker     Smokeless tobacco: Never Used   Alcohol use 0.5 oz/week   1 Shots of liquor   per week    Comment: socially  1 x month     Review of Systems Constitutional: Negative for activity change, appetite   change and fever. HENT: Negative for drooling, sinus pressure, tinnitus and   trouble swallowing. Eyes: Negative for blurred vision. Respiratory: Negative   for chest tightness and shortness of breath. Cardiovascular: Negative for   chest pain. Gastrointestinal: Negative for abdominal pain and vomiting.   Endocrine: Negative for cold intolerance and heat intolerance. Genitourinary:   Negative for hematuria. Musculoskeletal: Negative for joint swelling, myalgias   and neck pain. Neurological: Negative for syncope, facial asymmetry,   weakness, numbness and headaches. Hematological: Negative for adenopathy. Does   not bruise/bleed easily. Psychiatric/Behavioral: Negative for agitation,   behavioral problems, confusion and memory loss. All other systems reviewed and   are negative.     Physical Exam ED Triage Vitals Enc Vitals Group    BP 11/08/14 1043 142/63      Pulse 11/08/14 1043 70    Resp 11/08/14 1043 18    Temp 11/08/14 1043 98.8  F   (37.1  C)    Temp src 11/08/14 1043 Oral    SpO2 11/08/14 1043 100 %      Weight 11/08/14 1043 285 lb (129.3 kg)    Height 11/08/14 1043  (1.702 m)      Head Cir --    Peak Flow --    Pain Score 11/08/14 1043 8    Pain Loc --      Pain Edu? --    Excl. in GC? --     Physical Exam Constitutional: She is oriented to person, place, and time. She   appears well-developed and well-nourished. HENT: Head: Normocephalic. Head is   without raccoon's eyes and without Battle's  sign.     Right Ear: Hearing, tympanic membrane, external ear and ear canal normal.   Left Ear: Hearing, tympanic membrane, external ear and ear canal normal. Nose:  Nose normal. Mouth/Throat: Oropharynx is clear and moist. Eyes: Conjunctivae   and EOM are normal. Pupils are equal, round, and reactive to light. Right eye   exhibits no discharge. Left eye exhibits no discharge. No scleral icterus.   Neck: Normal range of motion. Neck supple. No JVD present. No tracheal   deviation present. No thyromegaly present. Cardiovascular: Normal rate,   regular rhythm, normal heart sounds and intact distal pulses.  Exam reveals no   gallop and no friction rub. No murmur heard. Pulmonary/Chest: Effort normal   and breath sounds normal. No stridor. No respiratory distress. She has no   wheezes. She has no rales. She exhibits no tenderness. Abdominal: Soft. Normal   appearance and bowel sounds are normal. She exhibits no distension and no   mass. There is no tenderness. There is no rebound and no guarding. No hernia.   Musculoskeletal: Normal range of motion. She exhibits no edema, tenderness or   deformity. Lymphadenopathy:   She has no cervical adenopathy. Neurological:   She is alert and oriented to person, place, and time. She has normal strength   and normal reflexes. She displays normal reflexes. No cranial nerve deficit or   sensory deficit. She exhibits normal muscle tone. Coordination normal. GCS   eye subscore is 4. GCS verbal subscore is 5. GCS motor subscore is 6. Reflex   Scores:      Tricep reflexes are 2+ on the right side and 2+ on the left side.        Bicep reflexes are 2+ on the right side and 2+ on the left side.        Brachioradialis reflexes are 2+ on the right side and 2+ on the left side.        Patellar reflexes are 2+ on the right side and 2+ on the left side.        Achilles reflexes are 2+ on the right side and 2+ on the left side. Skin: Skin   is warm. Psychiatric: She has a normal mood and affect.  Her behavior is   normal. Thought content normal. Nursing note and vitals reviewed.     ED Course Procedures     MDM     Labs ordered and reviewed:     No results found for this or any previous visit (from the past 12 hour(s)).         Xray interpretations:     Ct Head Wo Contrast     Leslee Suire Date: 11/08/2014 CT SCAN HEAD WITHOUT IV CONTRAST:  CLINICAL   INDICATION: headache  TECHNIQUE: Multiple axial images of the brain were   performed without IV contrast. Coronal and sagittal reconstructed images were   provided for review.  COMPARISON: Oct 19, 2013  FINDINGS: The ventricles,   sulci, and cisterns are within normal limits for patient's age. There is no   mass, shift of midline structures or extraaxial fluid collection. There is no   acute hemorrhage. There is no CT evidence of an acute territorial infarct.    The orbits are unremarkable.  The visualized paranasal sinuses are clear.    Mastoid air cells are clear.  Bones demonstrate no aggressive osteoblastic or   osteolytic lesions.  COMMENT: Please note that this record was generated using   voice recognition software. If there are any questions about the content of   this document, please contact the radiologist at [phone] as some errors   in transcription may have occurred.  No acute bleed.  No mass or mass effect seen on this unenhanced scan.  No CT   evidence of an acute territorial infarct.  Further evaluation as needed   clinically could be obtained with MRI.     The patient was given the following medication in the Emergency department:             Final Diagnosis:     Diagnosis  Cerebral concussion, without loss of consciousness, initial   encounter          The patient's clinical condition , present status and all objective findings   were discussed in detail with the admitting attending who has graciously   accepted the patient for admission and further treatment.         Electronically signed by: Ileene Patrick, MD     Ileene Patrick., MD 11/08/14  1258         _________________________________  Signed by:  Timmothy Euler    D: 11/08/2014 12:55 PM  T: 11/08/2014 12:55 PM    This document is confidential medical information.  Unauthorized disclosure or   use of this information is prohibited by law.  If you are not the intended recipient of this document, please advise [place] by   calling immediately [phone].

## 2014-11-08 NOTE — Other (Unsigned)
CURRENT STATUS IS EMERGENCY .      :     Any questions regarding PATIENT CLASS/STATUS should be directed to the Care   Management Departments: [facility] [phone] or [facility] [phone] or [facility]  [phone].         _________________________________  Signed byJamison Neighbor  NOTIFY    ZZ    D: 11/08/2014 10:13 AM  T:    This document is confidential medical information.  Unauthorized disclosure or   use of this information is prohibited by law.  If you are not the intended recipient of this document, please advise [place] by   calling immediately [phone].

## 2014-12-13 ENCOUNTER — Telehealth

## 2014-12-13 NOTE — Telephone Encounter (Signed)
Phychiatric retired without notice. Needs a new referral to a different doctor. Please fax to: [phone] Please advise

## 2014-12-13 NOTE — Telephone Encounter (Signed)
Please advise.

## 2014-12-14 NOTE — Telephone Encounter (Signed)
I printed referral with several names

## 2014-12-14 NOTE — Telephone Encounter (Signed)
Request has been faxed.   Left message informing pt.

## 2015-01-13 NOTE — Other (Unsigned)
------------------------------------------------------------------------------  -- Attestation signed by [name], [name], MD at 01/14/15 [place] NOTE        This patient was seen and examined with the midlevel provider. Examination   included the general exam. Abdomen was soft and nontender.     I agree with plan. Electronically signed by: [name], MD   ------------------------------------------------------------------------------  --     [facility] ED Encounter Arrival Date: 01/13/15 2310   Angelica Jones                            DOB: February 05, 1986 [address]   [place] [facility] [address] MRN: [id number] CSN: [id number]     HAR: [id number]     [place] NOTE     CHIEF COMPLAINT Chief Complaint Patient presents with   Fever     HPI Angelica Jones is a 29 year old female who presents to the ER with   complaints of low-grade fever which patient states been going on over the last   couple days up to 100.4, patient also complains of some nausea and vomiting   which patient states is been going on over the last 3 days she's vomited   approximately a couple times a day, patient denies any abdominal pain patient   denies any diarrhea or constipation patient denies any chest pain or shortness   of breath or any other upper respiratory symptoms denies any sore throat,   patient denies any urinary symptoms such as dysuria or hematuria, patient   denies any visual changes neurological deficits.     PAST MEDICAL HISTORY Past Medical History Diagnosis Date   Acute pancreatitis     Anemia   Anxiety   Bipolar affective disorder ([place])   Dr. [name]   Cellulitis   and abscess of leg   recurrent cellulitis. on PCN every couple weeks     Depressive disorder, not elsewhere classified   Edema   Hypertension   Lymph   edema   Morbid obesity (Uncertain)   PCOS (polycystic ovarian syndrome)   [name]   [name]   Unspecified essential hypertension      SURGICAL HISTORY Past Surgical History Procedure Laterality Date   Carpal   tunnel release Left   Cholecystectomy with/without common duct exploration    2003   [place]   Arthroscopy knee Right   lateral release   Gastric bypass     Gastric bypass,obesity,sb reconstruc     CURRENT MEDICATIONS Prior to Admission medications Medication Sig Start Date   End Date Taking? Authorizing Provider fluconazole (DIFLUCAN) 150 MG TABS Take   one tablet 5 days after Penicillin injection then repeat dose 3 to 4 days   later if symptoms develop. 12/18/14  Yes [name], MD Multiple Vitamin   (MULTI-VITAMIN DAILY PO) Take  by mouth daily.   Yes HISTORICAL MED   Penicillin G Benzathine & Proc (BICILLIN C-R, 1200000,) 1200000 UNIT/2ML SUSP   by Intramuscular route every 21 days.   Yes HISTORICAL MED famotidine (PEPCID)   40 MG TABS Take 1 tablet by mouth 2 (two) times daily. Patient taking   differently: Take 40 mg by mouth 2 (two) times daily. For severe   allergies/hives 10/12/13  Yes Entis, [name], MD hyoscyamine (LEVSIN/SL)   0.125 MG SUBL 1 tablet by Sublingual route 4 (four) times daily before meals   and  nightly. 10/18/14   Cheyenne Adas, MD ondansetron (ZOFRAN-ODT) 4 MG   TBDP Take 1 tablet by mouth every 8 (eight) hours as needed. 09/30/14     [name], MD promethazine (PHENERGAN) 25 MG TABS Take 1 tablet by   mouth every 6 (six) hours as needed. 06/14/14   Tymitz, [name], MD   EPINEPHrine (EPIPEN 2-PAK) 0.3 MG/0.3ML SOAJ Inject 0.3 mg as needed. 10/12/13   [name], MD albuterol 108 (90 BASE) MCG/ACT AERS Use 2 puffs every 4   (four) hours as needed. 10/12/13   [name], MD lisinopril   (PRINIVIL,ZESTRIL) 10 MG TABS Take 1 tablet by mouth daily. 10/10/13 01/13/15    [name], MD     ALLERGIES Allergies Allergen Reactions   Clindamycin Anaphylaxis   Prednisone   Other (See Comments)   States any steroids pancreatitis   Betadine [Povidone   Iodine] Hives and Rash   Morphine And  Related Anxiety     FAMILY HISTORY Family History Problem Relation Age of Onset   pulm embolus   [OTHER] Sister   pcos   Other [OTHER] Sister   Hypertension Mother   Seizures   Mother   Diabetes Father   Hypertension Father   Other [OTHER] Father   Cancer   Maternal Grandmother   uterine     SOCIAL HISTORY Social History     Social History   Marital status: Single   Spouse name: N/A   Number of   children: N/A   Years of education: N/A     Occupational History    Producer, television/film/video     Social History Main Topics   Smoking status: Never Smoker   Smokeless   tobacco: Never Used   Alcohol use 0.5 oz/week   1 Shots of liquor per week      Comment: socially  1 x month   Drug use: No   Sexual activity: Not Asked     Other Topics Concern   Weight Concern Yes   Special Diet Yes   Exercise Yes     Seat Belt Yes   Self-Exams Yes     Social History Narrative     REVIEW OF SYSTEMS All other review of systems are negative except for history   of present illness PHYSICAL EXAM VITAL SIGNS: Visit Vitals   BP 139/69     Pulse 74   Temp 98.9  F (37.2  C)   Resp 18   Ht 67" (170.2 cm)   Wt 258 lb   (117 kg)   LMP 01/05/2015   SpO2 99%   BMI 40.41 kg/m2     Constitutional:  Well developed, Well nourished, No acute distress, Non-toxic   appearance. HENT:  Normocephalic, Atraumatic, Bilateral external ears normal,   Oropharynx moist, No oral exudates, Nose normal. Neck- Normal range of   motion, No tenderness, Supple, No stridor. Eyes:  PERRL, EOMI, Conjunctiva   normal, No discharge. Respiratory:  Normal breath sounds, No respiratory   distress, No wheezing, No chest tenderness. Cardiovascular:  Normal heart   rate, Normal rhythm, No murmurs, No rubs, No gallops. GI:  Bowel sounds   normal, Soft, No tenderness, No masses, No pulsatile masses. Musculoskeletal:    Intact distal pulses, No edema, No tenderness, No cyanosis, No clubbing. Good   range of motion in all major joints. No tenderness to palpation or major   deformities noted. Back- No  tenderness. Integument:  Warm, Dry, No  erythema,   No rash. Neurologic:  Alert & oriented x 3, Normal motor function, Normal   sensory function, No focal deficits noted. Psychiatric:  Affect normal,   Judgment normal, Mood normal.     EKG     LABORATORY Recent Results (from the past 24 hour(s)) CBC with Differential    Collection Time: 01/14/15 12:01 AM Tekoa Amon Value Ref Range  WBC 5.2 3.6 - 10.5   THOU/mcL  RBC 4.42 3.80 - 5.20 MIL/mcL  HEMOGLOBIN 13.0 12.0 - 15.2 g/dL    HEMATOCRIT 37 36 - 46 %  MCV 84 82 - 97 fL  MCH 29 27 - 33 pg  MCHC 35 32 - 36   g/dL  RDW 14.4 <15.3 %  PLATELET 216 140 - 375 THOU/mcL  SEGS 65 %  BAND 1 %    LYMPHOCYTES 26 %  MONOS 7 %  EOS 1 %  ABS NEUTROPHIL 3.48 1.80 - 7.70   THOU/mcL  ABS LYMPHS 1.33 1.00 - 4.00 THOU/mcL  ABS MONOS 0.36 0.20 - 0.90   THOU/mcL  ABS EOS 0.05 0.03 - 0.45 THOU/mcL Hepatic Function Panel   (TBIL,DBIL,ALK,AST,ALT,TP,ALB)  Collection Time: 01/14/15 12:01 AM Lamis Behrmann   Value Ref Range  ALK PHOSPHATASE 112 35 - 135 IU/L  TOTAL PROTEIN 7.3 6.0 -   8.0 g/dL  ALBUMIN 4.3 3.5 - 5.0 g/dL  AST 32 10 - 40 IU/L  ALT 32 10 - 60 IU/L    TOTAL BILIRUBIN 3.8 (H) 0.0 - 1.2 mg/dL  DIRECT BILIRUBIN 0.3 (H) 0.0 - 0.2   mg/dL Lipase  Collection Time: 01/14/15 12:01 AM Haislee Corso Value Ref Range    LIPASE 25 22 - 51 U/L POCT Metabolic Panel - ISTAT  Collection Time: 01/14/15   12:06 AM Dickie Cloe Value Ref Range  GLUCOSE 87 74 - 99 mg/dL  BLD UREA NITROGEN   10 8 - 26 mg/dL  CREATININE 0.7 0.4 - 1.0 mg/dL  SODIUM 140 135 - 145 mEq/L    POTASSIUM 3.9 3.6 - 5.1 mEq/L  CHLORIDE 104 101 - 111 mEq/L  CO2 TOTAL 22 (L)   24 - 36 mEq/L  IONIZED CALCIUM 4.5 (L) 4.6 - 5.4 mg/dL  HEMOGLOBIN,CALC 12.6   12.0 - 15.2 g/dL  HEMATOCRIT 37 36 - 46 %PCV Urinalysis (UAS), STAT    Collection Time: 01/14/15  2:10 AM Milton Sagona Value Ref Range  UA SPECIMEN SOURCE   Malayja Freund  COLOR YELLOW YELLOW  APPEARANCE, URINE CLEAR CLEAR  PH, URINE 6.0 5.0   - 8.0  SPEC. GRAVITY, URINE 1.028 1.005 - 1.029  PROTEIN, URINE TRACE (A)    NEGATIVE  GLUCOSE, URINE NEGATIVE NEGATIVE  KETONE, URINE NEGATIVE NEGATIVE    BILIRUBIN, URINE NEGATIVE NEGATIVE  BLOOD, URINE NEGATIVE NEGATIVE    UROBILINOGEN, URINE 3.0 (H) <2.0 mg/dL  NITRITE, URINE NEGATIVE NEGATIVE    LEUKOCYTE ESTERASE, URINE NEGATIVE NEGATIVE Urine Microscopic  Collection   Time: 01/14/15  2:10 AM Jove Beyl Value Ref Range  RBC, URINE 3 0 - 5 /HPF  WBC,   URINE 1 0 - 3 /HPF  SQUAMOUS EPI CELLS 2 /HPF  BACTERIA PRESENT /HPF  MUCUS   PRESENT POCT GONADOT HCG QUALITATIVE  Collection Time: 01/14/15  2:19 AM   Hyla Coard Value Ref Range  HCG,POC URINE NEGATIVE NEGATIVE     RADIOLOGY No orders to display     PROCEDURES     ED COURSE & MEDICAL DECISION MAKING Pertinent Labs & Imaging studies   reviewed. (See chart for details) Patient  is evaluated in the ER by myself and   Dr. Rickard Rhymes, patient presents to the ER with complaints of some nausea and   vomiting as well as some low-grade fevers going on over the last several days,   patient denied any abdominal pain and had a nonsurgical abdominal exam here   in the ER patient had clear breath sounds with no upper respiratory symptoms   such as nasal drainage sore throat shortness of breath or chest pain, patient   had a negative UA for urinary tract infection here in the ER, negative   pregnancy test, patient had a white count here in the ER 5.2 stable H&H   patient has stable LFTs patient's bilirubin was elevated at 3.8 patient had a   lipase at 25, with the patient had a negative laboratory workup is felt the   patient can be managed as an outpatient patient's condition was discussed with   her primary care physician Dr. Nicole Kindred who recommended the patient follow up   with them beginning of next week patient's instructed to progress diet slowly   as tolerated patient has Phenergan and Zofran for home as well as Pepcid but   has not been taken patient's instructed to go ahead and continue taking his   medications patient is also take Tylenol for any  fevers patient is discharged   home in stable condition         The patient was given the following medication in the Emergency department:     Medication Administration from 01/13/2015 2310 to 01/14/2015 0309      Date/Time Order Dose Route Action Action by Comments   01/14/2015 0033   ondansetron (ZOFRAN) injection 4 mg 4 mg Intravenous Given Lovena Le   Dipilla   01/14/2015 0041 ketorolac (TORADOL) intraVENOUS injection 30 mg 30   mg Intravenous Given Lovena Le Dipilla   01/14/2015 0041 sodium chloride   0.9 % IV bolus 1,000 mL 1,000 mL Intravenous New Bag Jacqueline M Dipilla         FINAL DIAGNOSIS:     1. Fever in adult 2. Mild nausea and vomiting         The patient was given the following medications to go home with.     New Prescriptions  No medications on file     [name], [name], MD  spent face to face time with the patient and agrees   with above diagnosis and treatment.     [address]         Tessa Lerner, [place] 01/14/15 0309     [name], [name], MD 01/14/15 1829         _________________________________  Signed by:  Gwenith Daily    D: 01/13/2015 11:59 PM  T: 01/13/2015 11:59 PM    This document is confidential medical information.  Unauthorized disclosure or   use of this information is prohibited by law.  If you are not the intended recipient of this document, please advise [place] by   calling immediately [phone].

## 2015-01-13 NOTE — Other (Unsigned)
CURRENT STATUS IS EMERGENCY .      :     Any questions regarding PATIENT CLASS/STATUS should be directed to the Care   Management Departments: [facility] [phone] or [facility] [phone] or [facility]  [phone].         _________________________________  Signed byJamison Neighbor  NOTIFY    ZZ    D: 01/13/2015 11:10 PM  T:    This document is confidential medical information.  Unauthorized disclosure or   use of this information is prohibited by law.  If you are not the intended recipient of this document, please advise [place] by   calling immediately [phone].

## 2015-01-16 NOTE — Other (Unsigned)
CURRENT STATUS IS EMERGENCY .      :     Any questions regarding PATIENT CLASS/STATUS should be directed to the Care   Management Departments: [facility] [phone] or [facility] [phone] or [facility]  [phone].         _________________________________  Signed byJamison Neighbor  NOTIFY    ZZ    D: 01/16/2015 07:30 PM  T:    This document is confidential medical information.  Unauthorized disclosure or   use of this information is prohibited by law.  If you are not the intended recipient of this document, please advise [place] by   calling immediately [phone].

## 2015-01-16 NOTE — Other (Unsigned)
------------------------------------------------------------------------------  -- Attestation signed by Su Monks, MD at 01/19/15 2159 EMERGENCY   DEPARTMENT - ATTENDING NOTE         I have personally seen and examined this patient. I have fully   participated in the care of this patient with the resident/MLP.  I have   reviewed and agree with all pertinent clinical information including history,   physical exam, labs, radiographic studies and the plan. I have also reviewed   and agree with the medications, allergies and past medical history sections   for this patient.     Abdominal pain. Mainly in left upper quadrant. Some nausea and vomiting noted   as well. Started couple weeks ago. Presently worsening. History gastric   bypass. Tenderness over left upper quadrant epigastric area, no rebound, no   guarding. CT scan showed no acute abnormalities, her Monospot was positive.   Dr. Jeanne Ivan who is the patient's gastric bypass surgeon was informed and   patient will be discharged to follow-up after the patient's primary care   physician was contacted as well.     Electronically signed by: Su Monks, MD   ------------------------------------------------------------------------------  --     [facility] ED Encounter Arrival Date: 01/16/15 1930   Angelica Jones                            DOB: Oct 06, 1985 [id number]   [place] [address] MRN: [id number] CSN: [id number]     HAR: [id number]     Emergency DEPARTMENT -  Abdomen NOTE     CHIEF COMPLAINT Chief Complaint Patient presents with   Abdominal Pain     HPI Angelica Jones is a 29 year old female who presents to the emergency   department complaining of some left upper quadrant abdominal pain, generalized   malaise and fatigue, nausea and vomiting and night sweats. Patient's symptoms   started a few weeks ago and has gradually worsened. The abdominal pain   actually did not start until the last couple of days. She states  that she has   been monitoring her bilirubin which has steadily been increasing up to 3.8   over the last couple of weeks. She denies any cough or congestion, dysuria or   diarrhea. No ill contacts that she is aware of. Patient is status post gastric   bypass surgery in March. No additional aggravating or relieving factors. Pain   is a 5 out of 10.     REVIEW OF SYSTEMS See HPI for further details. All other systems reviewed and   otherwise negative.     PAST MEDICAL HISTORY Past Medical History Diagnosis Date   Acute pancreatitis     Anemia   Anxiety   Bipolar affective disorder ([place])   Dr. [name]   Cellulitis   and abscess of leg   recurrent cellulitis. on PCN every couple weeks     Depressive disorder, not elsewhere classified   Edema   Hypertension   Lymph   edema   Morbid obesity ([place])   PCOS (polycystic ovarian syndrome)   [name]   [name]   Unspecified essential hypertension     SURGICAL HISTORY Past Surgical History Procedure Laterality Date   Carpal   tunnel release Left   Cholecystectomy with/without common duct exploration    2003   [facility]   Arthroscopy knee Right   lateral release   Gastric bypass  Gastric bypass,obesity,sb reconstruc     CURRENT MEDICATIONS Current Outpatient Prescriptions Medication Sig Dispense   Refill   fluconazole (DIFLUCAN) 150 MG TABS Take one tablet 5 days after   Penicillin injection then repeat dose 3 to 4 days later if symptoms develop. 2   tablet 3   hyoscyamine (LEVSIN/SL) 0.125 MG SUBL 1 tablet by Sublingual route   4 (four) times daily before meals and nightly. 120 tablet 0   ondansetron   (ZOFRAN-ODT) 4 MG TBDP Take 1 tablet by mouth every 8 (eight) hours as needed.   20 tablet 0   promethazine (PHENERGAN) 25 MG TABS Take 1 tablet by mouth   every 6 (six) hours as needed. 50 tablet 0   Multiple Vitamin (MULTI-VITAMIN   DAILY PO) Take  by mouth daily.   Penicillin G Benzathine & Proc (BICILLIN   C-R, 1200000,) 1200000 UNIT/2ML SUSP by Intramuscular route every 21 days.      famotidine (PEPCID) 40 MG TABS Take 1 tablet by mouth 2 (two) times daily.   (Patient taking differently: Take 40 mg by mouth 2 (two) times daily. For   severe allergies/hives) 60 tablet 6   EPINEPHrine (EPIPEN 2-PAK) 0.3 MG/0.3ML   SOAJ Inject 0.3 mg as needed. 1 each 1   albuterol 108 (90 BASE) MCG/ACT AERS   Use 2 puffs every 4 (four) hours as needed. 1 inhaler 1     ALLERGIES Allergies Allergen Reactions   Clindamycin Anaphylaxis   Prednisone   Other (See Comments)   States any steroids pancreatitis   Betadine [Povidone   Iodine] Hives and Rash   Morphine And Related Anxiety     FAMILY HISTORY Family History Problem Relation Age of Onset   pulm embolus   [OTHER] Sister   pcos   Other [OTHER] Sister   Hypertension Mother   Seizures   Mother   Diabetes Father   Hypertension Father   Other [OTHER] Father   Cancer   Maternal Grandmother   uterine     SOCIAL HISTORY Social History     Social History   Marital status: Single   Spouse name: N/A   Number of   children: N/A   Years of education: N/A     Occupational History    Producer, television/film/video     Social History Main Topics   Smoking status: Never Smoker   Smokeless   tobacco: Never Used   Alcohol use 0.5 oz/week   1 Shots of liquor per week      Comment: socially  1 x month   Drug use: No   Sexual activity: Not Asked     Other Topics Concern   Weight Concern Yes   Special Diet Yes   Exercise Yes     Seat Belt Yes   Self-Exams Yes     Social History Narrative     PHYSICAL EXAM VITAL SIGNS: Visit Vitals   BP 141/83   Pulse 65   Temp 98.6  F   (37  C)   Resp 18   Ht 67" (170.2 cm)   Wt 258 lb (117 kg)   LMP 01/05/2015     SpO2 100%   BMI 40.41 kg/m2     Constitutional: Well-nourished well-developed no acute distress nontoxic in   appearance Eyes:  PERRL, conjunctiva normal HENT:  Atraumatic, external ears   normal, nose normal, oropharynx moist. Neck supple Respiratory:  Lungs clear   to auscultation bilaterally without wheezes, rales, or rhonchi, no  respiratory   distress  Cardiovascular: Regular rate and rhythm without murmurs, rubs, or   gallops, normal S1-S2 GI: Soft with left upper quadrant and epigastric   tenderness without rebound or guarding, no mass or hepatosplenomegaly GU: No   CVA tenderness Musculoskeletal:  No edema Integument:  Well hydrated   Neurologic:  Alert & oriented x 3, no focal deficits     LABORATORY Labs Reviewed CBC WITH DIFFERENTIAL - Abnormal; Notable for the   following:     Shalah Estelle Value  ABS BASOS 0.08 (*)  ABS REACTIVE LYMPHS 1.17 (*)    All other components within normal limits UA (URINALYSIS) - Abnormal; Notable   for the following:  COLOR BROWN (*)  APPEARANCE, URINE HAZY (*)  SPEC.   GRAVITY, URINE 1.042 (*)  PROTEIN, URINE 1+ (*)  KETONE, URINE 1+ (*)    BILIRUBIN, URINE   (*)  Value: Unable to obtain Vetra Shinall due to possible   interference.  UROBILINOGEN, URINE 8.0 (*)  All other components within normal   limits HEPATIC FUNCTION PANEL (TBIL,DBIL,ALK,AST,ALT,TP,ALB) - Abnormal;   Notable for the following:  AST 61 (*)  ALT 67 (*)  TOTAL BILIRUBIN 4.1 (*)    DIRECT BILIRUBIN 0.4 (*)  All other components within normal limits PROTIME &   INR - Abnormal; Notable for the following:  PROTIME 14.3 (*)  All other   components within normal limits LIPASE - Abnormal; Notable for the following:    LIPASE 16 (*)  All other components within normal limits URINE MICROSCOPIC -   Abnormal; Notable for the following:  RBC, URINE 13 (*)  WBC, URINE 4 (*)    HYALINE CASTS 17 (*)  All other components within normal limits MONOSPOT -   Abnormal; Notable for the following:  MONOSPOT POSITIVE (*)  All other   components within normal limits POCT METABOLIC PANEL - ISTAT - Abnormal;   Notable for the following:  CO2 TOTAL 23 (*)  All other components within   normal limits PTT MONOSPOT LIPASE POCT METABOLIC PANEL - ISTAT POCT URINE PREG   POCT GONADOT HCG QUALITATIVE     RADIOLOGY CT ABDOMEN PELVIS W CONTRAST Final Deakon Frix         1. No acute finding within the abdomen or  pelvis.     2. Mild fatty infiltration within the liver.     3. Prior gastric bypass.     4. Splenomegaly and multiple small retroperitoneal and mesenteric lymph   nodes. The possibility of low-grade lymphoma should be considered. The lymph   nodes may alternatively be reactive in nature.             ED COURSE & MEDICAL DECISION MAKING Pertinent Labs & Imaging studies   reviewed. (See chart for details) Su Monks, MD spent face to face time   with the patient and agrees with my assessment, care, and disposition.   Patient received a liter normal saline as well as 50  g of fentanyl and 4 mg   of Zofran IV. She received a second dose of Zofran IV while here. Laboratory   workup shows slightly increasing bilirubin and transaminases but normal   alkaline phosphatase. She does have a slight bandemia and positive Monospot.   CT scan shows some retroperitoneal mesenteric lymphadenopathy concerning for   possible low-grade lymphoma. I did communicate this to the patient. I did   speak with Dr. Jeanne Ivan, the patient's general surgeon. I also spoke with our   hospitalist,  Dr. Chrisandra Carota who thought she could be worked up as an outpatient. I   did contact the patient's primary care physician who agreed to see her this   morning and to have her call the office. Patient was written prescriptions for   Ultram and Zofran. She was advised to call her primary care physician first   thing this morning and return for any worsening symptoms. She is discharged in   stable condition.     The patient was given the following medication in the Emergency department:     Medication Administration from 01/16/2015 1930 to 01/17/2015 0116      Date/Time Order Dose Route Action Action by Comments   01/16/2015 2145 barium   sulfate (READI-CAT 2 CREAMY VANILLA) 2.1 % oral suspension 450 mL 450 mL Oral   Contrast Given Ritta Slot   01/16/2015 2130 sodium chloride 0.9% infusion   1,000 mL Intravenous New Bag Ritta Slot   01/16/2015 2301  sodium   chloride 0.9% infusion 0 mL Intravenous Stopped Ritta Slot   01/16/2015   2143 fentaNYL (SUBLIMAZE) injection 50 mcg 50 mcg Intravenous Given Ritta Slot   01/16/2015 2131 ondansetron (ZOFRAN) injection 4 mg 4 mg Intravenous   Given Ritta Slot   01/16/2015 2316 ioversol (OPTIRAY 350) 74 % injection   75 mL 75 mL Intravenous Contrast Given Nils Flack   01/17/2015 0015   ondansetron (ZOFRAN) injection 4 mg 4 mg Intravenous Given Bradley Ferris         Final Diagnosis:     1. EBV infection 2. Retroperitoneal lymphadenopathy 3. Bilirubinemia 4.   Splenomegaly         The patient was given the following medications to go home with.     Discharge Medication List as of 01/17/2015  1:00 AM     START taking these medications  Details !! ondansetron (ZOFRAN-ODT) 4 MG TBDP   Take 1 tablet by mouth every 6 (six) hours as needed.Print, Disp-15 tablet,   R-0      !! - Potential duplicate medications found. Please discuss with provider.         Electronically signed by: Barnetta Hammersmith, PA      Please note that some or all of this record was generated using voice   recognition software. If there are any questions about the content of this   document, please contact the author as some errors in transcription may have   occurred.     Barnetta Hammersmith, [place] 01/17/15 0245     Su Monks, MD 01/19/15 2159         _________________________________  Signed by:  Martha Clan    SN    D: 01/16/2015 11:48 PM  T: 01/16/2015 11:48 PM    This document is confidential medical information.  Unauthorized disclosure or   use of this information is prohibited by law.  If you are not the intended recipient of this document, please advise [place] by   calling immediately [phone].

## 2015-01-17 ENCOUNTER — Ambulatory Visit
Admit: 2015-01-17 | Discharge: 2015-01-17 | Payer: PRIVATE HEALTH INSURANCE | Attending: Family Medicine | Primary: Family Medicine

## 2015-01-17 DIAGNOSIS — B178 Other specified acute viral hepatitis: Secondary | ICD-10-CM

## 2015-01-17 NOTE — Telephone Encounter (Signed)
Pt has been scheduled.

## 2015-01-17 NOTE — Telephone Encounter (Signed)
[facility]us[facility] night while patient was in the ED. Patient Dx with Mononucleosis and needs to have Lymphoma ruled out. Patient states Dr.Tussey agreed to see patient on Today. Please advise      [name] [phone]

## 2015-01-19 ENCOUNTER — Inpatient Hospital Stay
Admit: 2015-01-19 | Discharge: 2015-01-20 | Disposition: A | Discharge status: Home / Self Care | Attending: Emergency Medicine

## 2015-01-19 DIAGNOSIS — B2799 Infectious mononucleosis, unspecified with other complication: Secondary | ICD-10-CM

## 2015-01-19 LAB — POCT VENOUS
Base Excess, Ven: 1 (ref <=3)
CO2: 24 mmol/L (ref 21–32)
Calcium, Ionized: 1.14 mmol/L (ref 1.12–1.32)
GFR African American: >60
GFR Non-African American: >60 (ref >60)
HCO3, Venous: 25.2 mmol/L (ref 23.0–29.0)
O2 Sat, Ven: 82 %
POC Anion Gap: 13 (ref 10–20)
POC BUN: 6 mg/dL — ABNORMAL LOW (ref 7–18)
POC Chloride: 105 mmol/L (ref 99–110)
POC Creatinine: 0.7 mg/dL (ref 0.6–1.1)
POC Glucose: 84 mg/dl (ref 70–99)
POC Potassium: 3.7 mmol/L (ref 3.5–5.1)
POC Sodium: 142 mmol/L (ref 136–145)
TC02 (Calc), Ven: 26 mmol/L
pCO2, Ven: 40.1 mm Hg (ref 40.0–50.0)
pH, Ven: 7.407 (ref 7.320–7.420)
pO2, Ven: 46 mm Hg

## 2015-01-19 LAB — MAGNESIUM: Magnesium: 2 mg/dL (ref 1.80–2.40)

## 2015-01-19 LAB — CBC WITH AUTO DIFFERENTIAL
Atypical Lymphocytes Relative: 24 % — ABNORMAL HIGH (ref 0–6)
Bands Relative: 3 % (ref 0–7)
Basophils %: 1 %
Basophils Absolute: 0.1 10*3/uL (ref 0.0–0.2)
Eosinophils %: 0 %
Eosinophils Absolute: 0 10*3/uL (ref 0.0–0.6)
Hematocrit: 36.2 % (ref 36.0–48.0)
Hemoglobin: 12.4 g/dL (ref 12.0–16.0)
Lymphocytes %: 45 %
Lymphocytes Absolute: 6.6 10*3/uL — ABNORMAL HIGH (ref 1.0–5.1)
MCH: 29.4 pg (ref 26.0–34.0)
MCHC: 34.3 g/dL (ref 31.0–36.0)
MCV: 85.7 fL (ref 80.0–100.0)
MPV: 9.5 fL (ref 5.0–10.5)
Monocytes %: 3 %
Monocytes Absolute: 0.3 10*3/uL (ref 0.0–1.3)
Neutrophils %: 24 %
Neutrophils Absolute: 2.6 10*3/uL (ref 1.7–7.7)
Platelets: 228 10*3/uL (ref 135–450)
RBC: 4.22 M/uL (ref 4.00–5.20)
RDW: 14.4 % (ref 12.4–15.4)
WBC: 9.6 10*3/uL (ref 4.0–11.0)

## 2015-01-19 LAB — HEPATIC FUNCTION PANEL
ALT: 101 U/L — ABNORMAL HIGH (ref 10–40)
AST: 77 U/L — ABNORMAL HIGH (ref 15–37)
Albumin: 3.9 g/dL (ref 3.4–5.0)
Alkaline Phosphatase: 127 U/L (ref 40–129)
Bilirubin, Direct: 0.3 mg/dL (ref 0.0–0.3)
Bilirubin, Indirect: 2.5 mg/dL — ABNORMAL HIGH (ref 0.0–1.0)
Total Bilirubin: 2.8 mg/dL — ABNORMAL HIGH (ref 0.0–1.0)
Total Protein: 6.8 g/dL (ref 6.4–8.2)

## 2015-01-19 LAB — PHOSPHORUS: Phosphorus: 3.1 mg/dL (ref 2.5–4.9)

## 2015-01-19 MED ORDER — SODIUM CHLORIDE 0.9 % IV BOLUS
0.9 % | Freq: Once | INTRAVENOUS | Status: AC
Start: 2015-01-19 — End: 2015-01-19
  Administered 2015-01-19: 22:00:00 1000 mL via INTRAVENOUS

## 2015-01-19 MED ORDER — ONDANSETRON HCL 4 MG/2ML IJ SOLN
4 MG/2ML | INTRAMUSCULAR | Status: DC | PRN
Start: 2015-01-19 — End: 2015-01-19
  Administered 2015-01-19: 22:00:00 4 mg via INTRAVENOUS

## 2015-01-19 MED FILL — ONDANSETRON HCL 4 MG/2ML IJ SOLN: 4 MG/2ML | INTRAMUSCULAR | Qty: 2

## 2015-01-19 MED FILL — SODIUM CHLORIDE 0.9 % IV SOLN: 0.9 % | INTRAVENOUS | Qty: 1000

## 2015-01-19 NOTE — ED Notes (Signed)
Report given by Nadeen Landau. Patient resting comfortably in bed with family at bedside Patient and family deny needs.      Roxanne Mins, RN  01/19/15 2004

## 2015-01-19 NOTE — Progress Notes (Signed)
Subjective:      Patient ID: Angelica Jones is a 29 y.o. female.    HPI Comments: Here for follow up from ED visit  Reason for visit: fatigue, jaundice, body aches   Diagnosis given: =mono hepatitis   Treatment: supportive tx   Work up: labs   Recommended follow up: 3 days   Now with following symptoms       Other   This is a new problem. The current episode started 1 to 4 weeks ago. The problem occurs constantly. The problem has been unchanged. Associated symptoms include abdominal pain, anorexia, arthralgias, chills, fatigue, a fever, headaches, myalgias, nausea, swollen glands and weakness. Pertinent negatives include no change in bowel habit, chest pain, congestion, coughing, diaphoresis, neck pain, numbness, rash, sore throat, urinary symptoms, vertigo, visual change or vomiting. Nothing aggravates the symptoms. Treatments tried: zofran' The treatment provided no relief.       Review of Systems   Constitutional: Positive for chills, fatigue and fever. Negative for diaphoresis.   HENT: Negative for congestion and sore throat.    Respiratory: Negative for cough.    Cardiovascular: Negative for chest pain.   Gastrointestinal: Positive for abdominal pain, anorexia and nausea. Negative for change in bowel habit and vomiting.   Musculoskeletal: Positive for arthralgias and myalgias. Negative for neck pain.   Skin: Negative for rash.   Neurological: Positive for weakness and headaches. Negative for vertigo and numbness.       Objective:   Physical Exam   Constitutional: She is oriented to person, place, and time. She appears well-developed and well-nourished. No distress.   Obese WF   HENT:   Head: Normocephalic and atraumatic.   Mouth/Throat: Oropharynx is clear and moist. No oropharyngeal exudate.   Eyes: EOM are normal. Pupils are equal, round, and reactive to light. Scleral icterus is present.   Neck: Normal range of motion. Neck supple. No JVD present. No thyromegaly present.   Cardiovascular: Normal rate, regular  rhythm, normal heart sounds and intact distal pulses.  Exam reveals no gallop and no friction rub.    No murmur heard.  Pulmonary/Chest: Effort normal and breath sounds normal. No respiratory distress. She has no wheezes. She has no rales. She exhibits no tenderness.   Abdominal: Soft. Bowel sounds are normal. She exhibits no distension and no mass. There is tenderness. There is no rebound and no guarding.   Mild diffuse abd      Musculoskeletal: Normal range of motion.   Lymphadenopathy:     She has no cervical adenopathy.   Neurological: She is alert and oriented to person, place, and time. She displays normal reflexes. No cranial nerve deficit. She exhibits normal muscle tone. Coordination normal.   Skin: Skin is warm. No rash noted. She is not diaphoretic. No erythema. No pallor.   Psychiatric: Her behavior is normal. Judgment and thought content normal.   Anxious mood     Nursing note and vitals reviewed.      Assessment:      1. Infectious mononucleosis hepatitis             Plan:          Supportive treatment   Increase fluids  Life style changes.     Results from ED discussed.     Re check labs on Friday    Patient advised to go to Ed or call 911 if sx worsen,  agrees and verbalizes understanding

## 2015-01-19 NOTE — Telephone Encounter (Signed)
Pt came into the office for blood draw. Pt stated that she felt weak like she's going to pass out. Pt's bp was check, 124/93.   [facility] was called and informed of pt current conditions. Pt was wheeled down to pt's father, whom was waiting in the parking lot for pt.

## 2015-01-19 NOTE — ED Notes (Signed)
MD in room speaking with patient and family.      Roxanne MinsAlexa C Belanna Manring, RN  01/19/15 2005

## 2015-01-19 NOTE — ED Provider Notes (Signed)
Date of evaluation: 01/19/2015    Chief Complaint   Dizziness      Nursing Notes, Past Medical Hx, Past Surgical Hx, Social Hx, Allergies, and Family Hx were reviewed.    History of Present Illness     Angelica Jones is a 29 y.o. female who presents With complaint of abdominal pain and dizziness.  Patient was actually seen at another hospital who diagnosed her with mononucleosis.  CT scan at that time shows splenomegaly.  She had an elevated total bilirubin of 2.8 and 3.1.  She has had a prior gastric bypass earlier this year at [facility] with a Dr.Tymitz. Patient is allergic to steroid-induced so she has not been able to have prednisone nor Decadron etc. For her mono.     Review of Systems     Review of Systems   Constitutional: Positive for activity change, appetite change and fatigue. Negative for chills and fever.   HENT: Negative.    Eyes: Negative.    Respiratory: Negative for cough and shortness of breath.    Cardiovascular: Negative for chest pain and palpitations.   Gastrointestinal: Positive for abdominal distention, abdominal pain and nausea. Negative for constipation and diarrhea.   Genitourinary: Negative for dysuria, frequency, urgency, vaginal bleeding and vaginal discharge.   Musculoskeletal: Negative.    Skin: Negative.    Neurological: Negative.    Hematological: Negative for adenopathy.   Psychiatric/Behavioral: Negative.    All other systems reviewed and are negative.      Past Medical, Surgical, Family, and Social History     She has a past medical history of Anxiety; Bipolar disorder (HCC); Blepharitis of both eyes; Cellulitis; Chronic back pain; Depression; Hiatal hernia; Hypertension; Lymphedema; Morbid obesity with BMI of 50.0-59.9, adult (HCC); Pancreatitis; Polycystic ovarian disease; and Urticaria.  She has a past surgical history that includes Cholecystectomy; Carpal tunnel release (Left); knee surgery (Right); Abdomen surgery; and Gastric bypass surgery.  Her family history  includes Diabetes in her father; High Blood Pressure in her father and mother; Kidney Disease in her father; Seizures in her mother.  She reports that she has never smoked. She does not have any smokeless tobacco history on file. She reports that she drinks alcohol. She reports that she does not use illicit drugs.    Medications     Previous Medications    CITALOPRAM (CELEXA) 20 MG TABLET    Take 1 tablet by mouth daily    DIPHENHYDRAMINE (BENADRYL) 25 MG CAPSULE    Take 1 capsule by mouth every 4 hours as needed for Itching WARNING: May cause drowsiness.  May impair ability to operate a motor vehicle or machinery.  Do not use in combination with alcohol.    DOXYCYCLINE (VIBRAMYCIN) 100 MG CAPSULE    Take 100 mg by mouth 2 times daily    EPINEPHRINE IJ    Inject as directed    FAMOTIDINE (PEPCID) 40 MG TABLET    Take 40 mg by mouth 2 times daily    FEXOFENADINE-PSEUDOEPHEDRINE (ALLEGRA-D 24 HOUR PO)    Take by mouth 3 times daily    LISINOPRIL (PRINIVIL;ZESTRIL) 10 MG TABLET    Take 1 tablet by mouth daily    LORAZEPAM (ATIVAN) 1 MG TABLET    Take 1 tablet by mouth daily as needed for Anxiety    NEOMYCIN-POLYMYXIN-DEXAMETH 3.5-10000-0.1 OINT    Place into both eyes daily    ONDANSETRON (ZOFRAN) 8 MG TABLET    Take 1 tablet by mouth 3 times daily  as needed for Nausea or Vomiting    PENICILLIN G BENZATHINE & PROC (BICILLIN C-R IM)    Inject into the muscle    TRAMADOL (ULTRAM) 50 MG TABLET    Take 1 tablet by mouth 2 times daily       Allergies     She is allergic to betadine [povidone iodine]; clindamycin/lincomycin; metformin and related; morphine; prednisone; and victoza [liraglutide].    Physical Exam     INITIAL VITALS:   Visit Vitals   ??? BP 140/85   ??? Pulse 55   ??? Temp 97.9 ??F (36.6 ??C) (Oral)   ??? Resp 17   ??? LMP 01/19/2015   ??? SpO2 99%      Physical Exam   Constitutional: She is oriented to person, place, and time. She appears well-developed and well-nourished. No distress.   HENT:   Head: Normocephalic.    Eyes: EOM are normal. Pupils are equal, round, and reactive to light. No scleral icterus.   Neck: Neck supple.   Cardiovascular: Normal rate, regular rhythm, normal heart sounds and intact distal pulses.    Pulmonary/Chest: Breath sounds normal. She has no rales.   Abdominal: Soft. Bowel sounds are normal. She exhibits no distension. There is tenderness. There is no guarding.   Patient has some tenderness with palpation of her abdomen without guarding or rigidity.  No tympani or tinkling.   Musculoskeletal: She exhibits no edema.   Lymphadenopathy:     She has no cervical adenopathy.   Neurological: She is alert and oriented to person, place, and time.   Skin: Skin is warm and dry. She is not diaphoretic.   Psychiatric: She has a normal mood and affect.   Nursing note and vitals reviewed.      Diagnostic Results     EKG       RADIOLOGY:  No orders to display         LABS:   Labs Reviewed   CBC WITH AUTO DIFFERENTIAL - Abnormal; Notable for the following:     Lymphocytes Absolute 6.6 (*)     Atypical Lymphocytes Relative 24 (*)     All other components within normal limits   HEPATIC FUNCTION PANEL - Abnormal; Notable for the following:     ALT 101 (*)     AST 77 (*)     Total Bilirubin 2.8 (*)     Bilirubin, Indirect 2.5 (*)     All other components within normal limits   POCT VENOUS - Abnormal; Notable for the following:     POC BUN 6 (*)     All other components within normal limits   MAGNESIUM   PHOSPHORUS   HEPATITIS PANEL, ACUTE   LIPASE   CK   POCT CHEM BASIC W ICA   POCT BLOOD GASES   POCT VENOUS       ED BEDSIDE ULTRASOUND:      RECENT VITALS:  BP: 140/85, Temp: 97.9 ??F (36.6 ??C), Pulse: 55, Resp: 17     Procedures         ED Course     The patient was given the following medications:  Orders Placed This Encounter   Medications   ??? 0.9 % sodium chloride bolus   ??? ondansetron (ZOFRAN) injection 4 mg       ED Course   There is no data filed.       CONSULTS:  IP CONSULT TO GI  IP CONSULT TO GENERAL  SURGERY  MEDICAL DECISION MAKING     Angelica Jones is a 29 y.o. female presents with discomfort in her upper abdomen who was recently diagnosed with mononucleosis.  Her total bilirubin was said to be 3.8 and 4.1 but is now 2.8 and improving.  After speaking with her surgeon and our gastroenterologists nothing else to be done at this time is strictly rests good nutrition and hydration. She was advised to be very careful as far as walking around the house and not get on a bicycles or do anything that could harm her abdomen and cause for injury to her spleen.    Critical Care:      Clinical Impression     1. Infectious mononucleosis, with other complication        Disposition/Plan     PATIENT RE[place]ED [place]  [address]  [place] [address]  [phone]            DISCHARGE MEDICATIONS:  New Prescriptions    No medications on file       DISPOSITION        Wyn Forster, DO  01/19/15 2021

## 2015-01-19 NOTE — ED Notes (Signed)
Bed: A09  Expected date:   Expected time:   Means of arrival:   Comments:  Next bed 2

## 2015-01-19 NOTE — ED Notes (Signed)
Patient discharged to home with all belongings. All discharge and follow up information discussed. Patient verbalized understanding and denies needs or questions. Patient is alert, oriented x4, no signs of acute distress.      Roxanne Mins, RN  01/19/15 2048

## 2015-01-19 NOTE — ED Notes (Signed)
Peripheral IV removed without complication. Bleeding controlled and bandage applied. Patient tolerated well.      Roxanne Mins, RN  01/19/15 2047

## 2015-01-20 LAB — HEPATITIS PANEL, ACUTE
Hep A IgM: NONREACTIVE
Hep B Core Ab, IgM: NONREACTIVE
Hep B S Ag Interp: NONREACTIVE
Hep C Ab Interp: NONREACTIVE

## 2015-01-24 ENCOUNTER — Ambulatory Visit
Admit: 2015-01-24 | Discharge: 2015-01-24 | Payer: PRIVATE HEALTH INSURANCE | Attending: Family Medicine | Primary: Family Medicine

## 2015-01-24 DIAGNOSIS — B178 Other specified acute viral hepatitis: Secondary | ICD-10-CM

## 2015-01-24 NOTE — Progress Notes (Signed)
Subjective:      Patient ID: Angelica Jones is a 29 y.o. female.    Pharyngitis   This is a new problem. The current episode started in the past 7 days. The problem occurs constantly. The problem has been unchanged. Associated symptoms include abdominal pain, anorexia, chills, fatigue, headaches, myalgias, nausea, swollen glands and vomiting. Pertinent negatives include no arthralgias, change in bowel habit, chest pain, congestion, coughing, diaphoresis, fever, neck pain, numbness, rash, sore throat, urinary symptoms, vertigo, visual change or weakness. Nothing aggravates the symptoms. She has tried nothing for the symptoms.   Abdominal Pain   This is a new problem. The current episode started 1 to 4 weeks ago. The onset quality is gradual. The problem occurs intermittently. The problem has been unchanged. The pain is located in the generalized abdominal region. The pain is moderate. The quality of the pain is aching. The abdominal pain does not radiate. Associated symptoms include anorexia, headaches, myalgias, nausea and vomiting. Pertinent negatives include no arthralgias, belching, constipation, diarrhea, dysuria, fever, flatus, frequency, hematochezia, hematuria, melena or weight loss. Nothing aggravates the pain. The pain is relieved by nothing. She has tried nothing for the symptoms. There is no history of abdominal surgery.   Emesis    This is a new problem. The current episode started 1 to 4 weeks ago. The problem occurs less than 2 times per day. The problem has been gradually improving. The emesis has an appearance of stomach contents. There has been no fever. Associated symptoms include abdominal pain, chills, headaches, myalgias and URI. Pertinent negatives include no arthralgias, chest pain, coughing, diarrhea, dizziness, fever, sweats or weight loss. She has tried nothing for the symptoms.       Review of Systems   Constitutional: Positive for chills and fatigue. Negative for diaphoresis, fever and weight  loss.   HENT: Negative for congestion and sore throat.    Respiratory: Negative for cough.    Cardiovascular: Negative for chest pain.   Gastrointestinal: Positive for abdominal pain, anorexia, nausea and vomiting. Negative for change in bowel habit, constipation, diarrhea, flatus, hematochezia and melena.   Genitourinary: Negative for dysuria, frequency and hematuria.   Musculoskeletal: Positive for myalgias. Negative for arthralgias and neck pain.   Skin: Negative for rash.   Neurological: Positive for headaches. Negative for dizziness, vertigo, weakness and numbness.     is allergic to betadine [povidone iodine]; clindamycin/lincomycin; metformin and related; morphine; prednisone; and victoza [liraglutide].      Current Outpatient Prescriptions:   ???  EPINEPHRINE IJ, Inject as directed, Disp: , Rfl:   ???  lisinopril (PRINIVIL;ZESTRIL) 10 MG tablet, Take 1 tablet by mouth daily, Disp: 90 tablet, Rfl: 1  ???  citalopram (CELEXA) 20 MG tablet, Take 1 tablet by mouth daily, Disp: 90 tablet, Rfl: 1  ???  LORazepam (ATIVAN) 1 MG tablet, Take 1 tablet by mouth daily as needed for Anxiety, Disp: 15 tablet, Rfl: 0  ???  ondansetron (ZOFRAN) 8 MG tablet, Take 1 tablet by mouth 3 times daily as needed for Nausea or Vomiting, Disp: 21 tablet, Rfl: 0  ???  diphenhydrAMINE (BENADRYL) 25 MG capsule, Take 1 capsule by mouth every 4 hours as needed for Itching WARNING: May cause drowsiness.  May impair ability to operate a motor vehicle or machinery.  Do not use in combination with alcohol., Disp: 12 capsule, Rfl: 0  ???  traMADol (ULTRAM) 50 MG tablet, Take 1 tablet by mouth 2 times daily, Disp: 30 tablet, Rfl: 0  ???  famotidine (PEPCID) 40 MG tablet, Take 40 mg by mouth 2 times daily, Disp: , Rfl:   ???  Fexofenadine-Pseudoephedrine (ALLEGRA-D 24 HOUR PO), Take by mouth 3 times daily, Disp: , Rfl:   ???  doxycycline (VIBRAMYCIN) 100 MG capsule, Take 100 mg by mouth 2 times daily, Disp: , Rfl:   ???  Penicillin G Benzathine & Proc (BICILLIN C-R  IM), Inject into the muscle, Disp: , Rfl:   ???  neomycin-polymyxin-dexameth 3.5-10000-0.1 OINT, Place into both eyes daily, Disp: , Rfl:      has a past medical history of Anxiety; Bipolar disorder (HCC); Blepharitis of both eyes; Cellulitis; Chronic back pain; Depression; Hiatal hernia; Hypertension; Lymphedema; Morbid obesity with BMI of 50.0-59.9, adult (HCC); Pancreatitis; Polycystic ovarian disease; and Urticaria.    Past Surgical History   Procedure Laterality Date   ??? Cholecystectomy     ??? Carpal tunnel release Left    ??? Knee surgery Right    ??? Abdomen surgery     ??? Gastric bypass surgery          reports that she has never smoked. She does not have any smokeless tobacco history on file. She reports that she drinks alcohol. She reports that she does not use illicit drugs.    family history includes Diabetes in her father; High Blood Pressure in her father and mother; Kidney Disease in her father; Seizures in her mother.    Objective:   Physical Exam   Constitutional: She is oriented to person, place, and time. She appears well-developed and well-nourished. No distress.   Obese WF   HENT:   Head: Normocephalic and atraumatic.   Mouth/Throat: Oropharyngeal exudate present.   Eyes: EOM are normal. Pupils are equal, round, and reactive to light. Scleral icterus is present.   Neck: Normal range of motion. Neck supple. No JVD present. No thyromegaly present.   Cardiovascular: Normal rate, regular rhythm, normal heart sounds and intact distal pulses.  Exam reveals no gallop and no friction rub.    No murmur heard.  Pulmonary/Chest: Effort normal and breath sounds normal. No respiratory distress. She has no wheezes. She has no rales. She exhibits no tenderness.   Abdominal: Soft. Bowel sounds are normal. She exhibits no distension and no mass. There is tenderness. There is no rebound and no guarding.   Mild diffuse abd      Musculoskeletal: Normal range of motion.   Lymphadenopathy:     She has cervical adenopathy.    Neurological: She is alert and oriented to person, place, and time. She displays normal reflexes. No cranial nerve deficit. She exhibits normal muscle tone. Coordination normal.   Skin: Skin is warm. No rash noted. She is not diaphoretic. No erythema. No pallor.   Psychiatric: She has a normal mood and affect. Her behavior is normal. Judgment and thought content normal.       Nursing note and vitals reviewed.      Assessment:      1. Infectious mononucleosis hepatitis  Hepatic Function Panel    THROAT CULTURE   2. Viral upper respiratory tract infection     3. Sore throat  THROAT CULTURE           Plan:      Improving  Needs fu labs  Fluids, cough drops  No candidate for nsaid (gastric bypass sx) and no tylenol (hepatitis)    Fu 1 week.

## 2015-01-25 LAB — HEPATIC FUNCTION PANEL
ALT: 96 U/L — ABNORMAL HIGH (ref 10–40)
AST: 69 U/L — ABNORMAL HIGH (ref 15–37)
Albumin: 4.1 g/dL (ref 3.4–5.0)
Alkaline Phosphatase: 108 U/L (ref 40–129)
Bilirubin, Direct: 0.4 mg/dL — ABNORMAL HIGH (ref 0.0–0.3)
Bilirubin, Indirect: 2.1 mg/dL — ABNORMAL HIGH (ref 0.0–1.0)
Total Bilirubin: 2.5 mg/dL — ABNORMAL HIGH (ref 0.0–1.0)
Total Protein: 6.5 g/dL (ref 6.4–8.2)

## 2015-01-26 ENCOUNTER — Encounter: Attending: Family Medicine | Primary: Family Medicine

## 2015-01-26 LAB — CULTURE, THROAT: Throat Culture: NORMAL

## 2015-02-01 ENCOUNTER — Ambulatory Visit
Admit: 2015-02-01 | Discharge: 2015-02-01 | Payer: PRIVATE HEALTH INSURANCE | Attending: Family Medicine | Primary: Family Medicine

## 2015-02-01 DIAGNOSIS — B178 Other specified acute viral hepatitis: Secondary | ICD-10-CM

## 2015-02-01 NOTE — Progress Notes (Signed)
Subjective:      Patient ID: Angelica Jones is a 29 y.o. female.    Abdominal Pain   This is a new problem. Episode onset: 3 weeks. The onset quality is gradual. The problem occurs intermittently. The problem has been waxing and waning. The pain is located in the generalized abdominal region. The pain is moderate. The quality of the pain is dull and a sensation of fullness. The abdominal pain does not radiate. Associated symptoms include headaches. Pertinent negatives include no anorexia, arthralgias, belching, constipation, diarrhea, dysuria, fever, flatus, frequency, hematochezia, hematuria, melena, myalgias, nausea, vomiting or weight loss.       Review of Systems   Constitutional: Positive for appetite change and fatigue. Negative for activity change, fever and weight loss.   HENT: Negative for congestion, sore throat, trouble swallowing and voice change.    Eyes: Negative for visual disturbance.   Respiratory: Negative for cough, chest tightness and shortness of breath.    Cardiovascular: Negative for chest pain, palpitations and leg swelling.   Gastrointestinal: Positive for abdominal pain. Negative for abdominal distention, anal bleeding, anorexia, constipation, diarrhea, flatus, hematochezia, melena, nausea and vomiting.   Genitourinary: Negative for dysuria, frequency and hematuria.   Musculoskeletal: Negative for arthralgias and myalgias.   Neurological: Positive for headaches. Negative for tremors and weakness.   Hematological: Negative for adenopathy. Does not bruise/bleed easily.   Psychiatric/Behavioral: The patient is not nervous/anxious.        is allergic to betadine [povidone iodine]; clindamycin/lincomycin; metformin and related; morphine; prednisone; and victoza [liraglutide].      Current Outpatient Prescriptions:   ???  EPINEPHRINE IJ, Inject as directed, Disp: , Rfl:   ???  LORazepam (ATIVAN) 1 MG tablet, Take 1 tablet by mouth daily as needed for Anxiety, Disp: 15 tablet, Rfl: 0  ???  ondansetron  (ZOFRAN) 8 MG tablet, Take 1 tablet by mouth 3 times daily as needed for Nausea or Vomiting, Disp: 21 tablet, Rfl: 0  ???  diphenhydrAMINE (BENADRYL) 25 MG capsule, Take 1 capsule by mouth every 4 hours as needed for Itching WARNING: May cause drowsiness.  May impair ability to operate a motor vehicle or machinery.  Do not use in combination with alcohol., Disp: 12 capsule, Rfl: 0  ???  traMADol (ULTRAM) 50 MG tablet, Take 1 tablet by mouth 2 times daily, Disp: 30 tablet, Rfl: 0  ???  famotidine (PEPCID) 40 MG tablet, Take 40 mg by mouth 2 times daily, Disp: , Rfl:   ???  Penicillin G Benzathine & Proc (BICILLIN C-R IM), Inject into the muscle, Disp: , Rfl:   ???  lisinopril (PRINIVIL;ZESTRIL) 10 MG tablet, Take 1 tablet by mouth daily, Disp: 90 tablet, Rfl: 1  ???  citalopram (CELEXA) 20 MG tablet, Take 1 tablet by mouth daily, Disp: 90 tablet, Rfl: 1  ???  Fexofenadine-Pseudoephedrine (ALLEGRA-D 24 HOUR PO), Take by mouth 3 times daily, Disp: , Rfl:   ???  doxycycline (VIBRAMYCIN) 100 MG capsule, Take 100 mg by mouth 2 times daily, Disp: , Rfl:   ???  neomycin-polymyxin-dexameth 3.5-10000-0.1 OINT, Place into both eyes daily, Disp: , Rfl:      has a past medical history of Anxiety; Bipolar disorder (HCC); Blepharitis of both eyes; Cellulitis; Chronic back pain; Depression; Hiatal hernia; Hypertension; Lymphedema; Morbid obesity with BMI of 50.0-59.9, adult (HCC); Pancreatitis; Polycystic ovarian disease; and Urticaria.    Past Surgical History   Procedure Laterality Date   ??? Cholecystectomy     ??? Carpal tunnel release  Left    ??? Knee surgery Right    ??? Abdomen surgery     ??? Gastric bypass surgery          reports that she has never smoked. She does not have any smokeless tobacco history on file. She reports that she drinks alcohol. She reports that she does not use illicit drugs.    family history includes Diabetes in her father; High Blood Pressure in her father and mother; Kidney Disease in her father; Seizures in her  mother.      Objective:   Physical Exam   Constitutional: She appears well-developed and well-nourished. No distress.   HENT:   Head: Normocephalic and atraumatic.   Mouth/Throat: No oropharyngeal exudate.   Eyes: Conjunctivae and EOM are normal. Pupils are equal, round, and reactive to light. No scleral icterus (jaundiced resolved).   Neck: Normal range of motion. Neck supple. No thyromegaly present.   Cardiovascular: Normal rate, regular rhythm, normal heart sounds and intact distal pulses.    No murmur heard.  Pulmonary/Chest: Effort normal and breath sounds normal.   Abdominal: Soft. Bowel sounds are normal. She exhibits no distension and no mass. There is tenderness. There is no rebound and no guarding.   Diffuse ttp with deep palpation  + hsm      Musculoskeletal: Normal range of motion.   Lymphadenopathy:     She has no cervical adenopathy.   Neurological: She is alert. No cranial nerve deficit. She exhibits normal muscle tone. Coordination normal.   Skin: Skin is warm. She is not diaphoretic. No erythema. No pallor.   Psychiatric: She has a normal mood and affect. Her behavior is normal. Judgment normal.   Nursing note and vitals reviewed.      Assessment:      1. Infectious mononucleosis hepatitis  CBC Auto Differential    Hepatic Function Panel    US Abdomen Complete   2. Left upper quadrant pain     3. Hepatomegaly  Hepatic Function Panel   4. Splenomegaly  Hepatic Function Panel           Plan:      1. Get fu labs  2. Get abd echo  Avoid trauma /contact sport  3. encourage activity and healthy diet her sx had been improving    Fu 2 weeks and prn

## 2015-02-02 LAB — HEPATIC FUNCTION PANEL
ALT: 42 U/L — ABNORMAL HIGH (ref 10–40)
AST: 29 U/L (ref 15–37)
Albumin: 4.4 g/dL (ref 3.4–5.0)
Alkaline Phosphatase: 97 U/L (ref 40–129)
Bilirubin, Direct: 0.4 mg/dL — ABNORMAL HIGH (ref 0.0–0.3)
Bilirubin, Indirect: 1.7 mg/dL — ABNORMAL HIGH (ref 0.0–1.0)
Total Bilirubin: 2.1 mg/dL — ABNORMAL HIGH (ref 0.0–1.0)
Total Protein: 6.6 g/dL (ref 6.4–8.2)

## 2015-02-02 LAB — CBC WITH AUTO DIFFERENTIAL
Basophils %: 2.2 %
Basophils Absolute: 0.1 10*3/uL (ref 0.0–0.2)
Eosinophils %: 1.1 %
Eosinophils Absolute: 0.1 10*3/uL (ref 0.0–0.6)
Hematocrit: 37.7 % (ref 36.0–48.0)
Hemoglobin: 12.5 g/dL (ref 12.0–16.0)
Lymphocytes %: 45.5 %
Lymphocytes Absolute: 2.5 10*3/uL (ref 1.0–5.1)
MCH: 29.1 pg (ref 26.0–34.0)
MCHC: 33.2 g/dL (ref 31.0–36.0)
MCV: 87.9 fL (ref 80.0–100.0)
MPV: 10.4 fL (ref 5.0–10.5)
Monocytes %: 11.2 %
Monocytes Absolute: 0.6 10*3/uL (ref 0.0–1.3)
Neutrophils %: 40 %
Neutrophils Absolute: 2.2 10*3/uL (ref 1.7–7.7)
Platelets: 242 10*3/uL (ref 135–450)
RBC: 4.29 M/uL (ref 4.00–5.20)
RDW: 15.8 % — ABNORMAL HIGH (ref 12.4–15.4)
WBC: 5.5 10*3/uL (ref 4.0–11.0)

## 2015-02-15 ENCOUNTER — Encounter

## 2015-02-15 ENCOUNTER — Inpatient Hospital Stay: Admit: 2015-02-15 | Attending: Family Medicine | Primary: Family Medicine

## 2015-02-15 DIAGNOSIS — B178 Other specified acute viral hepatitis: Secondary | ICD-10-CM

## 2015-02-27 ENCOUNTER — Encounter: Attending: Family Medicine | Primary: Family Medicine

## 2015-02-28 ENCOUNTER — Ambulatory Visit
Admit: 2015-02-28 | Discharge: 2015-02-28 | Payer: PRIVATE HEALTH INSURANCE | Attending: Family Medicine | Primary: Family Medicine

## 2015-02-28 DIAGNOSIS — M7072 Other bursitis of hip, left hip: Secondary | ICD-10-CM

## 2015-02-28 MED ORDER — HYDROCODONE-ACETAMINOPHEN 5-325 MG PO TABS
5-325 MG | ORAL_TABLET | Freq: Three times a day (TID) | ORAL | 0 refills | Status: AC | PRN
Start: 2015-02-28 — End: 2015-03-07

## 2015-02-28 NOTE — Progress Notes (Signed)
Subjective:      Patient ID: Angelica Jones is a 29 y.o. female.    Hip Pain    There was no injury mechanism (onset since July but getting worse now that she is trainin for 5 K race). Pain location: both hips  The quality of the pain is described as aching (sharp). The pain is at a severity of 7/10. The pain is severe. The pain has been intermittent since onset. Associated symptoms include a loss of sensation. Pertinent negatives include no inability to bear weight, loss of motion, muscle weakness, numbness or tingling. The symptoms are aggravated by movement and weight bearing. She has tried ice and elevation (nsaid contraindicated, tylenol afraid to take because of recen mono hepatitis) for the symptoms. The treatment provided no relief.   Knee Pain    The injury mechanism was a fall. The pain is present in the left knee. The quality of the pain is described as aching. The pain is moderate. The pain has been constant since onset. Associated symptoms include a loss of sensation. Pertinent negatives include no inability to bear weight, loss of motion, muscle weakness, numbness or tingling. She reports no foreign bodies present. The symptoms are aggravated by movement, weight bearing and palpation. She has tried ice for the symptoms. The treatment provided no relief.       Review of Systems   Constitutional: Positive for activity change. Negative for chills and fatigue.   Cardiovascular: Negative for chest pain and leg swelling.   Musculoskeletal: Positive for gait problem. Negative for back pain, myalgias, neck pain and neck stiffness.   Skin: Negative for color change, pallor and wound.   Neurological: Negative for tingling, weakness and numbness.   Psychiatric/Behavioral: Positive for sleep disturbance.       is allergic to betadine [povidone iodine]; clindamycin/lincomycin; metformin and related; morphine; prednisone; and victoza [liraglutide].      Current Outpatient Prescriptions:   ???  HYDROcodone-acetaminophen  (NORCO) 5-325 MG per tablet, Take 1 tablet by mouth every 8 hours as needed for Pain, Disp: 21 tablet, Rfl: 0  ???  LORazepam (ATIVAN) 1 MG tablet, Take 1 tablet by mouth daily as needed for Anxiety, Disp: 15 tablet, Rfl: 0  ???  ondansetron (ZOFRAN) 8 MG tablet, Take 1 tablet by mouth 3 times daily as needed for Nausea or Vomiting, Disp: 21 tablet, Rfl: 0  ???  Penicillin G Benzathine & Proc (BICILLIN C-R IM), Inject into the muscle, Disp: , Rfl:   ???  EPINEPHRINE IJ, Inject as directed, Disp: , Rfl:   ???  lisinopril (PRINIVIL;ZESTRIL) 10 MG tablet, Take 1 tablet by mouth daily, Disp: 90 tablet, Rfl: 1  ???  citalopram (CELEXA) 20 MG tablet, Take 1 tablet by mouth daily, Disp: 90 tablet, Rfl: 1  ???  diphenhydrAMINE (BENADRYL) 25 MG capsule, Take 1 capsule by mouth every 4 hours as needed for Itching WARNING: May cause drowsiness.  May impair ability to operate a motor vehicle or machinery.  Do not use in combination with alcohol., Disp: 12 capsule, Rfl: 0  ???  traMADol (ULTRAM) 50 MG tablet, Take 1 tablet by mouth 2 times daily, Disp: 30 tablet, Rfl: 0  ???  famotidine (PEPCID) 40 MG tablet, Take 40 mg by mouth 2 times daily, Disp: , Rfl:   ???  Fexofenadine-Pseudoephedrine (ALLEGRA-D 24 HOUR PO), Take by mouth 3 times daily, Disp: , Rfl:   ???  doxycycline (VIBRAMYCIN) 100 MG capsule, Take 100 mg by mouth 2 times daily,  Disp: , Rfl:   ???  neomycin-polymyxin-dexameth 3.5-10000-0.1 OINT, Place into both eyes daily, Disp: , Rfl:      has a past medical history of Anxiety; Bipolar disorder (HCC); Blepharitis of both eyes; Cellulitis; Chronic back pain; Depression; Hiatal hernia; Hypertension; Lymphedema; Morbid obesity with BMI of 50.0-59.9, adult (HCC); Pancreatitis; Polycystic ovarian disease; and Urticaria.    Past Surgical History   Procedure Laterality Date   ??? Cholecystectomy     ??? Carpal tunnel release Left    ??? Knee surgery Right    ??? Abdomen surgery     ??? Gastric bypass surgery          reports that she has never smoked. She  does not have any smokeless tobacco history on file. She reports that she drinks alcohol. She reports that she does not use illicit drugs.    family history includes Diabetes in her father; High Blood Pressure in her father and mother; Kidney Disease in her father; Seizures in her mother.      Objective:   Physical Exam   Constitutional: She is oriented to person, place, and time. She appears well-developed and well-nourished. No distress.   HENT:   Head: Normocephalic.   Hearing intact to nml conversation      Eyes: Conjunctivae and EOM are normal. Pupils are equal, round, and reactive to light. No scleral icterus.   Neck: Normal range of motion.   Cardiovascular: Normal rate and regular rhythm.    No murmur heard.  Pulmonary/Chest: Effort normal and breath sounds normal. No respiratory distress. She has no wheezes.   Musculoskeletal: Normal range of motion.        Right hip: She exhibits tenderness. She exhibits normal range of motion, normal strength, no bony tenderness, no swelling, no crepitus and no deformity.        Left hip: She exhibits tenderness. She exhibits normal range of motion, normal strength, no bony tenderness, no swelling, no crepitus and no deformity.        Right knee: She exhibits normal range of motion and no swelling.        Left knee: She exhibits normal range of motion, no swelling, no effusion, no ecchymosis, no deformity, no erythema, no bony tenderness, normal meniscus and no MCL laxity. Tenderness found. Medial joint line tenderness noted. No lateral joint line, no MCL, no LCL and no patellar tendon tenderness noted.        Lumbar back: She exhibits normal range of motion, no tenderness, no bony tenderness, no pain and no spasm.   Neurological: She is alert and oriented to person, place, and time. No cranial nerve deficit. Coordination normal.   Skin: Skin is warm. She is not diaphoretic. No erythema.   Psychiatric: She has a normal mood and affect. Her behavior is normal.   Nursing  note and vitals reviewed.      Assessment:      1. Bilateral hip bursitis  HYDROcodone-acetaminophen (NORCO) 5-325 MG per tablet     2. Knee contusion        Plan:      Short trial with norco  Bursitis exercise  If sx persist will refer to ortho  patient agrees and verbalizes understanding    .Controlled Substances Monitoring: Attestation: The Prescription Monitoring Report for this patient was reviewed today.  Daune Perch, MD)  Documentation: No signs of potential drug abuse or diversion identified. Daune Perch, MD)        Continue ice/rest/elevation

## 2015-03-27 ENCOUNTER — Telehealth

## 2015-03-27 NOTE — Telephone Encounter (Signed)
 Pt calling for the repeat on her Ultrasound. She's questioning if she should still have it done. She was instructed to repeat in 6 weeks. Please Leave a detail message on cell phone. Thanks

## 2015-03-28 NOTE — Telephone Encounter (Signed)
Order completed and faxed.   Left message informing pt.

## 2015-03-28 NOTE — Telephone Encounter (Signed)
Yes     Order abdominal ultrasound  Dx splenomegaly

## 2015-04-06 ENCOUNTER — Encounter

## 2015-04-11 ENCOUNTER — Encounter

## 2015-04-11 ENCOUNTER — Telehealth

## 2015-04-11 NOTE — Telephone Encounter (Signed)
 Pt aware.

## 2015-04-11 NOTE — Telephone Encounter (Signed)
Patient is calling because she would like a call back to discuss her test results from 04/06/15.  Please advise.   Angelica StanleyLisa [phone] (home)

## 2015-04-12 LAB — HEPATIC FUNCTION PANEL
ALT: 18 U/L (ref 10–40)
AST: 15 U/L (ref 15–37)
Albumin: 4.3 g/dL (ref 3.4–5.0)
Alkaline Phosphatase: 100 U/L (ref 40–129)
Bilirubin, Direct: <0.2 mg/dL (ref 0.0–0.3)
Total Bilirubin: 1.7 mg/dL — ABNORMAL HIGH (ref 0.0–1.0)
Total Protein: 6.6 g/dL (ref 6.4–8.2)

## 2015-04-12 LAB — CBC
Hematocrit: 39.1 % (ref 36.0–48.0)
Hemoglobin: 13.3 g/dL (ref 12.0–16.0)
MCH: 29.2 pg (ref 26.0–34.0)
MCHC: 34 g/dL (ref 31.0–36.0)
MCV: 85.8 fL (ref 80.0–100.0)
MPV: 11 fL — ABNORMAL HIGH (ref 5.0–10.5)
PLATELET SLIDE REVIEW: ADEQUATE
Platelets: 252 10*3/uL (ref 135–450)
RBC: 4.56 M/uL (ref 4.00–5.20)
RDW: 13.5 % (ref 12.4–15.4)
WBC: 8 10*3/uL (ref 4.0–11.0)

## 2015-04-24 ENCOUNTER — Encounter: Admit: 2015-04-24 | Primary: Family Medicine

## 2015-04-24 ENCOUNTER — Inpatient Hospital Stay
Admit: 2015-04-24 | Discharge: 2015-04-25 | Disposition: A | Discharge status: Home / Self Care | Attending: Emergency Medicine

## 2015-04-24 DIAGNOSIS — R1084 Generalized abdominal pain: Secondary | ICD-10-CM

## 2015-04-24 LAB — POC URINE WITH MICROSCOPIC
Bilirubin Urine: NEGATIVE mg/dL
Blood, Urine: NEGATIVE
Glucose, Ur: NEGATIVE mg/dL
Ketones, Urine: NEGATIVE mg/dL
Leukocyte Esterase, Urine: NEGATIVE
Nitrite, Urine: NEGATIVE
Protein, UA: NEGATIVE mg/dL
Specific Gravity, UA: 1.015 (ref 1.005–1.030)
Urobilinogen, Urine: 0.2 E.U./dL (ref <2.0)
pH, UA: 6.5 (ref 5.0–8.0)

## 2015-04-24 LAB — AMYLASE: Amylase: 37 U/L (ref 25–115)

## 2015-04-24 LAB — POCT VENOUS
CO2: 25 mmol/L (ref 21–32)
Calcium, Ionized: 1.19 mmol/L (ref 1.12–1.32)
GFR African American: >60
GFR Non-African American: >60 (ref >60)
Lactate: 0.58 mmol/L (ref 0.40–2.00)
POC Anion Gap: 16 (ref 10–20)
POC BUN: 5 mg/dL — ABNORMAL LOW (ref 7–18)
POC Chloride: 104 mmol/L (ref 99–110)
POC Creatinine: 0.7 mg/dL (ref 0.6–1.1)
POC Glucose: 99 mg/dl (ref 70–99)
POC Potassium: 3.6 mmol/L (ref 3.5–5.1)
POC Sodium: 145 mmol/L (ref 136–145)

## 2015-04-24 LAB — HEPATIC FUNCTION PANEL
ALT: 23 U/L (ref 10–40)
AST: 20 U/L (ref 15–37)
Albumin: 3.8 g/dL (ref 3.4–5.0)
Alkaline Phosphatase: 82 U/L (ref 40–129)
Bilirubin, Direct: 0.3 mg/dL (ref 0.0–0.3)
Bilirubin, Indirect: 1.7 mg/dL — ABNORMAL HIGH (ref 0.0–1.0)
Total Bilirubin: 2 mg/dL — ABNORMAL HIGH (ref 0.0–1.0)
Total Protein: 6.8 g/dL (ref 6.4–8.2)

## 2015-04-24 LAB — POC PREGNANCY UR-QUAL: Pregnancy, Urine: NEGATIVE

## 2015-04-24 LAB — LIPASE: Lipase: 24 U/L (ref 13.0–60.0)

## 2015-04-24 MED ORDER — ONDANSETRON HCL 4 MG/2ML IJ SOLN
4 MG/2ML | Freq: Once | INTRAMUSCULAR | Status: AC
Start: 2015-04-24 — End: 2015-04-24
  Administered 2015-04-24: 23:00:00 4 mg via INTRAVENOUS

## 2015-04-24 MED ORDER — ACETAMINOPHEN 500 MG PO TABS
500 MG | Freq: Once | ORAL | Status: AC
Start: 2015-04-24 — End: 2015-04-24
  Administered 2015-04-24: 23:00:00 1000 mg via ORAL

## 2015-04-24 MED ORDER — IOPAMIDOL 76 % IV SOLN
76 % | Freq: Once | INTRAVENOUS | Status: AC | PRN
Start: 2015-04-24 — End: 2015-04-24
  Administered 2015-04-25: 80 mL via INTRAVENOUS

## 2015-04-24 MED ORDER — IOHEXOL 240 MG/ML IJ SOLN
240 MG/ML | Freq: Once | INTRAMUSCULAR | Status: AC | PRN
Start: 2015-04-24 — End: 2015-04-24
  Administered 2015-04-24: 23:00:00 50 mL via ORAL

## 2015-04-24 MED ORDER — SODIUM CHLORIDE 0.9 % IV BOLUS
0.9 % | Freq: Once | INTRAVENOUS | Status: AC
Start: 2015-04-24 — End: 2015-04-24
  Administered 2015-04-24: 23:00:00 1000 mL via INTRAVENOUS

## 2015-04-24 MED FILL — TYLENOL EXTRA STRENGTH 500 MG PO TABS: 500 MG | ORAL | Qty: 2

## 2015-04-24 MED FILL — ONDANSETRON HCL 4 MG/2ML IJ SOLN: 4 MG/2ML | INTRAMUSCULAR | Qty: 2

## 2015-04-24 MED FILL — SODIUM CHLORIDE 0.9 % IV SOLN: 0.9 % | INTRAVENOUS | Qty: 1000

## 2015-04-24 NOTE — Telephone Encounter (Signed)
Pt c/o Vomiting,Bloating and dizziness x2 days. Feels the same way she did when she had Mono. Patient was scheduled for an appt for 12/2 but would like to be sooner. Please advise
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
## 2015-04-24 NOTE — Telephone Encounter (Signed)
Eval today is advised.  No appt available with me today.  [facility];local urgent care or ER today.

## 2015-04-24 NOTE — ED Notes (Signed)
Bed: A30  Expected date:   Expected time:   Means of arrival:   Comments:  Next bed 1

## 2015-04-24 NOTE — Telephone Encounter (Signed)
Pt informed.  French Anaracy, NP and Angelica ParentsAnnette Jones is not in office today for appts.  Pt advised to go to local urgent care or ER.  Close Encounter
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
## 2015-04-24 NOTE — ED Provider Notes (Signed)
ED Attending Attestation Note     Date of evaluation: 04/24/2015    This patient was seen by the resident physician.  I have seen and examined the patient, I agree with the workup, evaluation, management and diagnosis. The care plan has been discussed and I concur.  My assessment reveals patient complaining about abdominal pain nausea vomiting and generalized fatigue.  She states feels similar when they diagnosed her with mononucleosis hepatitis.  Patient's also had a history of a gastric bypass. She's also had a cholecystectomy in the past.  On exam recent stress abdomen was mild midepigastric tenderness but no true rebound or guarding.Angelica Jones.       Rylan Bernard W Johnnae Impastato, MD  04/24/15 2106

## 2015-04-25 MED ORDER — ONDANSETRON HCL 8 MG PO TABS
8 MG | ORAL_TABLET | Freq: Three times a day (TID) | ORAL | 0 refills | Status: DC | PRN
Start: 2015-04-25 — End: 2015-05-07

## 2015-04-27 ENCOUNTER — Ambulatory Visit
Admit: 2015-04-27 | Discharge: 2015-04-27 | Payer: PRIVATE HEALTH INSURANCE | Attending: Family Medicine | Primary: Family Medicine

## 2015-04-27 DIAGNOSIS — R161 Splenomegaly, not elsewhere classified: Secondary | ICD-10-CM

## 2015-04-27 NOTE — Progress Notes (Signed)
Subjective:      Patient ID: Gabriel CarinaLisa M Nhem is a 29 y.o. female.    HPI Comments: Here for follow up from ED visit  Reason for visit: severe LUQ pain  Diagnosis given: same  Treatment: zofran  Work up: ct /labs  Recommended follow up: 1 week (pt was at ed yesterday)  Now with following symptoms       Abdominal Pain   This is a recurrent problem. The current episode started yesterday. The onset quality is sudden. The problem occurs intermittently. The problem has been unchanged. The pain is located in the LUQ. The pain is at a severity of 8/10. The pain is severe. The quality of the pain is sharp and tearing. The abdominal pain does not radiate. Associated symptoms include anorexia, nausea and vomiting. Pertinent negatives include no arthralgias, belching, constipation, diarrhea, dysuria, fever, flatus, headaches, hematochezia, hematuria, melena, myalgias or weight loss. Nothing aggravates the pain. The pain is relieved by nothing. She has tried nothing for the symptoms. Prior diagnostic workup includes CT scan. Her past medical history is significant for abdominal surgery.   Other   Associated symptoms include abdominal pain, anorexia, fatigue, nausea and vomiting. Pertinent negatives include no arthralgias, congestion, coughing, fever, headaches, myalgias, rash, sore throat or swollen glands.   Dizziness   This is a new problem. The current episode started in the past 7 days. The problem occurs intermittently. The problem has been unchanged. Associated symptoms include abdominal pain, anorexia, fatigue, nausea and vomiting. Pertinent negatives include no arthralgias, congestion, coughing, fever, headaches, myalgias, rash, sore throat or swollen glands. The symptoms are aggravated by bending. She has tried nothing for the symptoms.       Review of Systems   Constitutional: Positive for fatigue. Negative for fever and weight loss.   HENT: Negative for congestion and sore throat.    Respiratory: Negative for cough.     Gastrointestinal: Positive for abdominal pain, anorexia, nausea and vomiting. Negative for constipation, diarrhea, flatus, hematochezia and melena.   Genitourinary: Negative for dysuria and hematuria.   Musculoskeletal: Negative for arthralgias and myalgias.   Skin: Negative for rash.   Neurological: Positive for dizziness. Negative for headaches.       is allergic to betadine [povidone iodine]; clindamycin/lincomycin; metformin and related; morphine; prednisone; and victoza [liraglutide].      Current Outpatient Prescriptions:   ???  ondansetron (ZOFRAN) 8 MG tablet, Take 1 tablet by mouth every 8 hours as needed for Nausea, Disp: 20 tablet, Rfl: 0  ???  EPINEPHRINE IJ, Inject as directed, Disp: , Rfl:   ???  Penicillin G Benzathine & Proc (BICILLIN C-R IM), Inject into the muscle, Disp: , Rfl:   ???  neomycin-polymyxin-dexameth 3.5-10000-0.1 OINT, Place into both eyes daily, Disp: , Rfl:   ???  lisinopril (PRINIVIL;ZESTRIL) 10 MG tablet, Take 1 tablet by mouth daily, Disp: 90 tablet, Rfl: 1  ???  citalopram (CELEXA) 20 MG tablet, Take 1 tablet by mouth daily, Disp: 90 tablet, Rfl: 1  ???  LORazepam (ATIVAN) 1 MG tablet, Take 1 tablet by mouth daily as needed for Anxiety, Disp: 15 tablet, Rfl: 0  ???  ondansetron (ZOFRAN) 8 MG tablet, Take 1 tablet by mouth 3 times daily as needed for Nausea or Vomiting, Disp: 21 tablet, Rfl: 0  ???  diphenhydrAMINE (BENADRYL) 25 MG capsule, Take 1 capsule by mouth every 4 hours as needed for Itching WARNING: May cause drowsiness.  May impair ability to operate a motor vehicle or machinery.  Do not use in combination with alcohol., Disp: 12 capsule, Rfl: 0  ???  famotidine (PEPCID) 40 MG tablet, Take 40 mg by mouth 2 times daily, Disp: , Rfl:   ???  Fexofenadine-Pseudoephedrine (ALLEGRA-D 24 HOUR PO), Take by mouth 3 times daily, Disp: , Rfl:   ???  doxycycline (VIBRAMYCIN) 100 MG capsule, Take 100 mg by mouth 2 times daily, Disp: , Rfl:      has a past medical history of Anxiety; Bipolar disorder  (HCC); Blepharitis of both eyes; Cellulitis; Chronic back pain; Depression; Hiatal hernia; Hypertension; Lymphedema; Morbid obesity with BMI of 50.0-59.9, adult (HCC); Pancreatitis; Polycystic ovarian disease; and Urticaria.    Past Surgical History   Procedure Laterality Date   ??? Cholecystectomy     ??? Carpal tunnel release Left    ??? Knee surgery Right    ??? Abdomen surgery     ??? Gastric bypass surgery          reports that she has never smoked. She does not have any smokeless tobacco history on file. She reports that she drinks alcohol. She reports that she does not use illicit drugs.    family history includes Diabetes in her father; High Blood Pressure in her father and mother; Kidney Disease in her father; Seizures in her mother.      Objective:   Physical Exam   Constitutional: She is oriented to person, place, and time. She appears well-developed and well-nourished. No distress.   HENT:   Head: Normocephalic and atraumatic.   Mouth/Throat: Oropharynx is clear and moist. No oropharyngeal exudate.   Eyes: EOM are normal. Pupils are equal, round, and reactive to light. Scleral icterus is present.   Neck: Normal range of motion. Neck supple. No thyromegaly present.   Cardiovascular: Normal rate and regular rhythm.    No murmur heard.  Pulmonary/Chest: Effort normal and breath sounds normal. No respiratory distress. She has no wheezes. She has no rales. She exhibits no tenderness.   Abdominal: Soft. Bowel sounds are normal. She exhibits no distension and no mass. There is tenderness (LUQ ttp, + splenomegaly). There is no rebound and no guarding.   Mild epigastric tenderness    Lymphadenopathy:     She has no cervical adenopathy.   Neurological: She is alert and oriented to person, place, and time. She has normal reflexes. She displays normal reflexes. No cranial nerve deficit. She exhibits normal muscle tone. Coordination normal.   Skin: Skin is warm. No rash noted. She is not diaphoretic. No erythema.    Psychiatric: Her behavior is normal. Thought content normal.   Anxious mood     Nursing note and vitals reviewed.      Assessment:      .  1. Splenomegaly  Ranelle Oyster, MD   2. Hyperbilirubinemia  Ambulatory referral to Gastroenterology           Plan:      1. Now with recurrent pain  Referred to surgery for eval  No thrombocytopenia or anemia in recent labs    Ct discussed with her  Declines barium swallow   Increase fluids with lytes   Small portions   zofran prn    2. Deteriorated and unchanged after mono   Refer to liver consult     Time face to face >25 minutes, 50% time spent in education and coordination of care  Topics discussed:   Medical condition, diff diagnoses, work up options, medication use/secondary effects/medication interactions, and life style  changes     Fu 6 weeks

## 2015-04-27 NOTE — Telephone Encounter (Signed)
Pt calling in. S/p bypass RNY 07/28/14 [facility].  Wanting a second opinion. Pt has an enlarge spleen since last year. Has been causing some pain since Aug 2016. Had mono in aug 2016. Pt stated that she will vomit at times due to the pain.  Was in ER on 04/24/15.  Had a US and it showed a cyst on her liver and her spleen. Pt talked to her PCP Dr. Lowell Boutonussey and he suggested that she call our office.  I asked her if she spoke to bariatric surgeon about this but didn't sound like they were much help.  She is in pain and wanted a second suggestion.  I did let her know that we would need [facility], I was unable to view them.  And Dr Rozanna Boerahman would review and let us know.  Pt understood.  plz advise.  Pt [phone]

## 2015-04-30 NOTE — Telephone Encounter (Signed)
Agree will look at her records first

## 2015-05-03 ENCOUNTER — Encounter: Attending: Family Medicine | Primary: Family Medicine

## 2015-05-07 ENCOUNTER — Ambulatory Visit
Admit: 2015-05-07 | Discharge: 2015-05-07 | Payer: PRIVATE HEALTH INSURANCE | Attending: Surgery | Primary: Family Medicine

## 2015-05-07 DIAGNOSIS — R161 Splenomegaly, not elsewhere classified: Secondary | ICD-10-CM

## 2015-05-07 NOTE — Patient Instructions (Signed)
PLAN:  1.  Agree with evaluation by bariatric/MIS surgeon Dr. Kae Hellerymitz to rule out complication related to gastric bypass surgery, possible ulcer disease  2.  If negative, would check stool studies to rule out colitis or possibly C. Diff  3.  If negative, would recommend evaluation by hematologist to rule out blood disorders that may require splenectomy  4. Follow with general surgeon as needed pending the above results  5. Avoid high contact activities as patients with splenomegaly are at risk for splenic rupture. This would be a surgical emergency and require emergent evaluation at the nearest ED
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
## 2015-05-15 NOTE — Telephone Encounter (Signed)
Pt would like to know if it's ok for her eo get a Flu vaccine with all that she has going on. Please advise  [phone] (home)

## 2015-05-16 NOTE — Telephone Encounter (Signed)
Pt informed.

## 2015-05-16 NOTE — Telephone Encounter (Signed)
Yes ok for her to have flu vaccine

## 2015-11-08 NOTE — Other (Unsigned)
CURRENT STATUS IS EMERGENCY .       :      Any questions regarding PATIENT CLASS/STATUS should be directed to the Care   Management Departments: [facility] [phone] or [facility] [phone] or [facility]  [phone].           _________________________________  Signed byJamison Neighbor:        [facility]  NOTIFY     ZZ    D: 11/08/2015 03:19 PM  T:     This document is confidential medical information.  Unauthorized disclosure or   use of this information is prohibited by law.  If you are not the intended recipient of this document, please advise us by   calling immediately [phone].

## 2015-11-08 NOTE — Other (Unsigned)
[facility] Emergency Department ED Encounter Arrival Date: 11/08/15 1519   Angelica Jones                            DOB: 09-24-1985 [address]   [name] [address] MRN: [id number] CSN: [id number]     HAR: [id number]      EMERGENCY DEPARTMENT - HEAD NOTE      CHIEF COMPLAINT Chief Complaint Patient presents with   Headache      HPI Angelica Jones is a 30 year old female who presents to planning of a   headache since she hit her head on Sunday. With that she's had some   intermittent nausea and has vomited a couple times. The headache is diffuse   mainly frontal throbbing. With that she's had a headache she has some light   sensitivity. She has felt dizzy. She does have a history of 2 previous   concussions prior to this episode. No other neurological symptoms no other   modifying factors or associated symptoms.      REVIEW OF SYSTEMS See HPI for further details. All other review of systems   otherwise negative.      PAST MEDICAL HISTORY Past Medical History: Diagnosis Date   Acute   pancreatitis   Anemia   Anxiety   Bipolar affective disorder (HCC)  Dr. Marnette Burgess     Cellulitis and abscess of leg  recurrent cellulitis. on PCN every couple   weeks   Depressive disorder, not elsewhere classified   Edema   Hypertension     Lymph edema   Morbid obesity (HCC)   PCOS (polycystic ovarian syndrome)    [name]   Unspecified essential hypertension      SURGICAL HISTORY Past Surgical History: Procedure Laterality Date     ARTHROSCOPY KNEE Right  lateral release   CARPAL TUNNEL RELEASE Left     CHOLECYSTECTOMY WITH/WITHOUT COMMON DUCT EXPLORATION  2003  [facility]   GASTRIC   BYPASS   GASTRIC BYPASS,OBESITY,SB RECONSTRUC      CURRENT MEDICATIONS Prior to Admission medications Medication Sig Start Date   End Date Taking? Authorizing Provider dicyclomine (BENTYL) 20 MG TABS Take 1   tablet by mouth every 6 (six) hours as needed. 05/17/15   Demetra Shiner,   MD promethazine (PHENERGAN) 12.5 MG TABS Take 1 tablet  by mouth every 6 (six)   hours as needed. 05/17/15   Demetra Shiner, MD ondansetron (ZOFRAN-ODT) 4   MG TBDP Take 1 tablet by mouth every 6 (six) hours as needed. 01/17/15     Hessie Dibble, PA tramadol (ULTRAM) 50 MG TABS Take 1 tablet by   mouth every 6 (six) hours as needed. 01/17/15   Hessie Dibble, PA   fluconazole (DIFLUCAN) 150 MG TABS Take one tablet 5 days after Penicillin   injection then repeat dose 3 to 4 days later if symptoms develop. 12/18/14   Gracelyn Nurse., MD hyoscyamine (LEVSIN/SL) 0.125 MG SUBL 1 tablet by   Sublingual route 4 (four) times daily before meals and nightly. 10/18/14     Demetra Shiner, MD ondansetron (ZOFRAN-ODT) 4 MG TBDP Take 1 tablet by   mouth every 8 (eight) hours as needed. Patient taking differently: Take 4 mg   by mouth as needed. 09/30/14   Lajuana Carry., MD promethazine (PHENERGAN)   25 MG TABS Take 1 tablet by mouth every 6 (six) hours as needed. 06/14/14  Tymitz, Humberto SealsKevin Michael, MD Multiple Vitamin (MULTI-VITAMIN DAILY PO) Take  by   mouth daily.    HISTORICAL MED Penicillin G Benzathine & Proc (BICILLIN C-R,   1200000,) 1200000 UNIT/2ML SUSP by Intramuscular route every 21 days.      HISTORICAL MED famotidine (PEPCID) 40 MG TABS Take 1 tablet by mouth 2 (two)   times daily. Patient taking differently: Take 40 mg by mouth 2 (two) times   daily. For severe allergies/hives 10/12/13   Ladene ArtistEntis, Gregory N., MD EPINEPHrine   (EPIPEN 2-PAK) 0.3 MG/0.3ML SOAJ Inject 0.3 mg as needed. 10/12/13 Ladene ArtistEntis,   Gregory N., MD albuterol 108 (90 BASE) MCG/ACT AERS Use 2 puffs every 4 (four)   hours as needed. 10/12/13   Ladene ArtistEntis, Gregory N., MD      ALLERGIES Allergies Allergen Reactions   Clindamycin Anaphylaxis   Prednisone   Other (See Comments)   States any steroids pancreatitis   Betadine [Povidone   Iodine] Hives and Rash   Morphine And Related Anxiety      FAMILY HISTORY Family History Problem Relation Age of Onset   pulm embolus   [OTHER] Sister   pcos    Other [OTHER] Sister   Hypertension Mother   Seizures   Mother   Diabetes Father   Hypertension Father   Other [OTHER] Father   Cancer   Maternal Grandmother   uterine      SOCIAL HISTORY Social History      Social History   Marital status: Single   Spouse name: N/A   Number of   children: N/A   Years of education: N/A      Occupational History    Teacher, 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      Social History Main Topics   Smoking status: Never Smoker   Smokeless   tobacco: Never Used   Alcohol use 0.5 oz/week   1 Shots of liquor per week      Comment: socially  1 x month   Drug use: No   Sexual activity: Not Asked      Other Topics Concern   Weight Concern Yes   Special Diet Yes   Exercise Yes     Seat Belt Yes   Self-Exams Yes      Social History Narrative      PHYSICAL EXAM VITAL SIGNS: BP 143/77   Pulse 62   Temp 98.1  F (36.7  C)   (Oral)    Resp 14   LMP 11/08/2015   SpO2 100% Constitutional:  Well   developed, well nourished, no acute distress, non-toxic appearance obese   female Eyes: Pupils are equal round reactive to light extraocular movements   are intact HENT: Ears nose mouth and throat are clear no battles or raccoon   sign no palpable swollen or bruised areas on the scalp or head Respiratory:    No respiratory distress, normal breath sounds Cardiovascular:  Normal rate,   normal rhythm, no murmurs, no gallops, no rubs Musculoskeletal:  No edema, no   tenderness, no deformities. Back- no tenderness Integument:  Well hydrated, no   rash Neurologic: Cranial nerves II through XII intact motor shows 5 over 5   strength sensory is intact negative pronator drift normal rapid alternating   motion with both hands; gait and station are normal      RADIOLOGY CT HEAD WO CONTRAST Final Ozzie Remmers      No mass, hemorrhage or cortical edema.  DOSE REDUCTION MEASURE: All CT scans performed at [facility] facilities use   dose optimization techniques as appropriate to the performed exam.           [facility] Imaging Report Main Call Center [phone] [facility] Call Center:   [phone]           ED COURSE & MEDICAL DECISION MAKING Pertinent Labs & Imaging studies   reviewed. (See chart for details) Patient's CAT scan of the head was negative   I am to put her on some Zofran and I wrote her for some Naprosyn and I'm been   a refer her to a neurosurgeon      The patient was given the following medication in the Emergency department:      Medication Administration from 11/08/2015 1519 to 11/08/2015 1818      Date/Time Order Dose Route Action Action by Comments   11/08/2015 1659   ondansetron (ZOFRAN) tablet 4 mg 4 mg Oral Given Christine Hassert     11/08/2015 1703 acetaminophen (TYLENOL) tablet 1,000 mg 1,000 mg Oral Given   Christine  Hassert           FINAL DIAGNOSIS:      1. Significant closed head trauma within past 3 months 2. Postconcussion   syndrome           The patient was given the following medications to go home with.      New Prescriptions  ONDANSETRON (ZOFRAN) 4 MG TABS    Take 1 tablet by mouth   every 8 (eight) hours as needed.      Follow-up Information  Follow up With Details Comments Contact Info    Iverson Alamin., MD Call in 1 day For follow up appointment in the next   few days [address] [facility] [address] [phone]       Magdalen Spatz, MD Call in 1 day For follow up appointment in the next few   days [address] [facility] [address] [phone]           Cheri Rous., MD 11/08/15 1818           _________________________________  Signed by:   Brantley Fling    D: 11/08/2015 04:53 PM  T: 11/08/2015 04:53 PM    This document is confidential medical information.  Unauthorized disclosure or   use of this information is prohibited by law.  If you are not the intended recipient of this document, please advise [place] by   calling immediately [phone].

## 2015-11-09 ENCOUNTER — Encounter: Attending: Family Medicine | Primary: Family Medicine

## 2016-01-09 ENCOUNTER — Encounter: Attending: Family Medicine | Primary: Family Medicine

## 2016-01-10 ENCOUNTER — Ambulatory Visit
Admit: 2016-01-10 | Discharge: 2016-01-10 | Payer: BLUE CROSS/BLUE SHIELD | Attending: Family Medicine | Primary: Family Medicine

## 2016-01-10 DIAGNOSIS — N390 Urinary tract infection, site not specified: Secondary | ICD-10-CM

## 2016-01-10 LAB — POCT URINALYSIS DIPSTICK
Bilirubin, UA: NEGATIVE
Glucose, UA POC: NEGATIVE
Ketones, UA: NEGATIVE
Nitrite, UA: NEGATIVE
Protein, UA POC: NEGATIVE
Spec Grav, UA: 1.01
Urobilinogen, UA: 0.2
pH, UA: 7

## 2016-01-10 MED ORDER — NITROFURANTOIN MONOHYD MACRO 100 MG PO CAPS
100 MG | ORAL_CAPSULE | Freq: Two times a day (BID) | ORAL | 0 refills | Status: AC
Start: 2016-01-10 — End: 2016-01-20

## 2016-01-10 NOTE — Progress Notes (Signed)
Subjective:      Patient ID: Angelica Jones is a 30 y.o. female.    Urinary Tract Infection    This is a recurrent problem. The current episode started more than 1 month ago. The problem occurs intermittently. The problem has been unchanged. The quality of the pain is described as burning. The pain is moderate. There has been no fever. She is sexually active. There is no history of pyelonephritis. Associated symptoms include frequency, hesitancy and urgency. Pertinent negatives include no chills, discharge, flank pain, hematuria, nausea, sweats or vomiting. She has tried nothing for the symptoms. There is no history of recurrent UTIs.   Hip Pain    There was no injury mechanism. Pain location: both hips. The pain is moderate. The pain has been constant since onset. Pertinent negatives include no inability to bear weight, loss of motion, loss of sensation, numbness or tingling. She reports no foreign bodies present. The symptoms are aggravated by palpation and movement. She has tried acetaminophen for the symptoms. The treatment provided no relief.   Back Pain   This is a chronic problem. The current episode started more than 1 year ago. The problem occurs constantly. The problem has been waxing and waning since onset. The pain is present in the lumbar spine. The quality of the pain is described as aching. The pain is moderate. The pain is worse during the day. The symptoms are aggravated by bending, twisting and standing. Stiffness is present all day. Pertinent negatives include no abdominal pain, bladder incontinence, bowel incontinence, chest pain, dysuria, fever, headaches, leg pain, numbness, paresis, paresthesias, pelvic pain, perianal numbness, tingling, weakness or weight loss. Risk factors include obesity and poor posture. She has tried ice (tylenol) for the symptoms. The treatment provided mild relief.       Review of Systems   Constitutional: Positive for activity change. Negative for chills, fatigue, fever,  unexpected weight change and weight loss.   Respiratory: Negative for chest tightness and shortness of breath.    Cardiovascular: Negative for chest pain.   Gastrointestinal: Negative for abdominal pain, bowel incontinence, constipation, nausea and vomiting.   Genitourinary: Positive for frequency, hesitancy and urgency. Negative for bladder incontinence, difficulty urinating, dysuria, flank pain, hematuria and pelvic pain.   Musculoskeletal: Positive for back pain. Negative for arthralgias, gait problem, joint swelling, myalgias, neck pain and neck stiffness.   Skin: Negative for rash.   Neurological: Negative for dizziness, tingling, weakness, numbness, headaches and paresthesias.   Psychiatric/Behavioral: Negative for dysphoric mood.       is allergic to betadine [povidone iodine]; clindamycin/lincomycin; metformin and related; morphine; prednisone; and victoza [liraglutide].      Current Outpatient Prescriptions:   ???  nitrofurantoin, macrocrystal-monohydrate, (MACROBID) 100 MG capsule, Take 1 capsule by mouth 2 times daily for 10 days, Disp: 20 capsule, Rfl: 0  ???  penicillin G benzathine (BICILLIN L-A) 1200000 UNIT/2ML SUSP, Inject 1.2 Million Units into the muscle once, Disp: , Rfl:   ???  Multiple Vitamins-Minerals (THERAPEUTIC MULTIVITAMIN-MINERALS) tablet, Take 1 tablet by mouth daily, Disp: , Rfl:   ???  Acetaminophen (TYLENOL) 325 MG CAPS, Take by mouth, Disp: , Rfl:      has a past medical history of Anxiety; Bipolar disorder (HCC); Blepharitis of both eyes; Cellulitis; Chronic back pain; Depression; Hiatal hernia; Hypertension; Lymphedema; Morbid obesity with BMI of 50.0-59.9, adult (HCC); Pancreatitis; Polycystic ovarian disease; and Urticaria.    Past Surgical History:   Procedure Laterality Date   ??? ABDOMEN SURGERY     ???  CARPAL TUNNEL RELEASE Left    ??? CHOLECYSTECTOMY     ??? GASTRIC BYPASS SURGERY     ??? KNEE SURGERY Right         reports that she has never smoked. She does not have any smokeless tobacco  history on file. She reports that she drinks alcohol. She reports that she does not use illicit drugs.    family history includes Diabetes in her father; High Blood Pressure in her father and mother; Kidney Disease in her father; Seizures in her mother.      Objective:   Physical Exam   Constitutional: She is oriented to person, place, and time. She appears well-developed and well-nourished. No distress.   HENT:   Mouth/Throat: Oropharynx is clear and moist.   Eyes: Conjunctivae and EOM are normal. Pupils are equal, round, and reactive to light.   Neck: Normal range of motion. Neck supple. No thyromegaly present.   Cardiovascular: Normal rate, regular rhythm and normal heart sounds.    Pulmonary/Chest: Effort normal and breath sounds normal. She has no wheezes. She has no rales. She exhibits no tenderness.   Abdominal: Soft. Bowel sounds are normal. She exhibits no distension and no mass. There is no tenderness. There is no rebound and no guarding.   Musculoskeletal:        Right hip: She exhibits tenderness. She exhibits normal range of motion, normal strength, no bony tenderness, no swelling, no crepitus and no deformity.        Left hip: She exhibits tenderness. She exhibits normal range of motion, normal strength, no bony tenderness, no swelling, no crepitus and no deformity.        Cervical back: Normal.        Thoracic back: Normal.        Lumbar back: She exhibits decreased range of motion, tenderness, pain and spasm. She exhibits no bony tenderness, no swelling, no edema, no deformity and normal pulse.   Lymphadenopathy:     She has no cervical adenopathy.   Neurological: She is alert and oriented to person, place, and time. She has normal reflexes. No cranial nerve deficit. She exhibits normal muscle tone. Coordination normal.   Skin: Skin is warm. No rash noted. She is not diaphoretic. No erythema.   Psychiatric: She has a normal mood and affect. Her behavior is normal.   Nursing note and vitals  reviewed.      Assessment:      1. Urinary tract infection without hematuria, site unspecified  POCT Urinalysis no Micro    URINE CULTURE   2. Bursitis of both hips, unspecified bursa  [facility] [place] [facility] SilviusGallagher, John MD (orthopedic surgery)   3. Chronic bilateral low back pain without sciatica             Plan:      macrobid  Increase fluids  If sx persist we need pelvic exam   patient agrees and verbalizes understanding  Patient to call if symptoms persist or worsen.       2. Chronic   Referral to ortho    4.back exercises, tylenol  Fu in 1 mo if sx persist will refer to PT

## 2016-01-13 LAB — CULTURE, URINE: Urine Culture, Routine: 50000

## 2016-01-15 DIAGNOSIS — M7061 Trochanteric bursitis, right hip: Secondary | ICD-10-CM | POA: Insufficient documentation

## 2016-01-15 DIAGNOSIS — M7062 Trochanteric bursitis, left hip: Secondary | ICD-10-CM

## 2016-02-07 DIAGNOSIS — M5431 Sciatica, right side: Secondary | ICD-10-CM | POA: Insufficient documentation

## 2016-03-26 ENCOUNTER — Telehealth

## 2016-03-26 NOTE — Telephone Encounter (Signed)
rx signed

## 2016-03-26 NOTE — Telephone Encounter (Signed)
[name] is calling requesting a order or rx be written for patient to get a pair of new Compression Stockings. Please advise  [name] [phone] (home)

## 2016-03-26 NOTE — Telephone Encounter (Signed)
Last ov 01/10/2016  Ordered and pended for your signature

## 2016-03-27 NOTE — Telephone Encounter (Signed)
Pt informed, pt will pick up rx.

## 2016-04-30 ENCOUNTER — Ambulatory Visit
Admit: 2016-04-30 | Discharge: 2016-04-30 | Payer: BLUE CROSS/BLUE SHIELD | Attending: Family Medicine | Primary: Family Medicine

## 2016-04-30 DIAGNOSIS — J029 Acute pharyngitis, unspecified: Secondary | ICD-10-CM

## 2016-04-30 MED ORDER — CIPROFLOXACIN HCL 0.3 % OP SOLN
0.3 % | Freq: Four times a day (QID) | OPHTHALMIC | 0 refills | Status: AC
Start: 2016-04-30 — End: 2016-05-10

## 2016-04-30 MED ORDER — AZITHROMYCIN 250 MG PO TABS
250 MG | PACK | ORAL | 0 refills | Status: AC
Start: 2016-04-30 — End: 2016-05-10

## 2016-05-01 DIAGNOSIS — B9789 Other viral agents as the cause of diseases classified elsewhere: Secondary | ICD-10-CM

## 2016-05-01 DIAGNOSIS — J029 Acute pharyngitis, unspecified: Secondary | ICD-10-CM | POA: Insufficient documentation

## 2016-05-01 DIAGNOSIS — J028 Acute pharyngitis due to other specified organisms: Secondary | ICD-10-CM

## 2016-05-01 NOTE — Progress Notes (Signed)
Subjective:      Patient ID: Angelica Jones is a 30 y.o. female.    Pharyngitis   This is a new problem. Episode onset: 3 days. The problem occurs constantly. The problem has been gradually worsening. Associated symptoms include chills, congestion, fatigue, headaches, myalgias, neck pain and a sore throat. Pertinent negatives include no coughing, fever, nausea, numbness, swollen glands or vomiting. The symptoms are aggravated by swallowing. She has tried acetaminophen and NSAIDs for the symptoms. The treatment provided mild relief.   Otalgia    There is pain in the left ear. This is a new problem. The current episode started today. The problem occurs constantly. The problem has been waxing and waning. The pain is moderate. Associated symptoms include headaches, neck pain, rhinorrhea and a sore throat. Pertinent negatives include no coughing or vomiting. She has tried acetaminophen, NSAIDs and heat packs for the symptoms. The treatment provided mild relief.       Review of Systems   Constitutional: Positive for chills and fatigue. Negative for fever.   HENT: Positive for congestion, ear pain, rhinorrhea and sore throat.    Respiratory: Negative for cough.    Gastrointestinal: Negative for nausea and vomiting.   Musculoskeletal: Positive for myalgias and neck pain.   Neurological: Positive for headaches. Negative for numbness.       Objective:   Physical Exam   Constitutional: She is oriented to person, place, and time. She appears well-developed and well-nourished. No distress.   HENT:   Head: Normocephalic and atraumatic.   Right Ear: External ear normal.   Left Ear: External ear normal.   Nose: Nose normal.   Mouth/Throat: Mucous membranes are normal. Posterior oropharyngeal erythema present. No oropharyngeal exudate or tonsillar abscesses.   Mild op erythema  Bilateral tm wnl  Dysphonia     Eyes: Conjunctivae and EOM are normal. Pupils are equal, round, and reactive to light. Left eye exhibits no discharge. No  scleral icterus.   Neck: Normal range of motion. Neck supple. No thyromegaly present.   Cardiovascular: Normal rate, regular rhythm and normal heart sounds.  Exam reveals no gallop and no friction rub.    No murmur heard.  Pulmonary/Chest: Effort normal and breath sounds normal. No respiratory distress. She has no wheezes. She has no rales. She exhibits no tenderness.   Lymphadenopathy:     She has no cervical adenopathy.   Neurological: She is alert and oriented to person, place, and time. She has normal reflexes. No cranial nerve deficit.   Skin: Skin is warm. No rash noted. She is not diaphoretic. No erythema.   Psychiatric: She has a normal mood and affect. Her behavior is normal.   Nursing note and vitals reviewed.      RS neg      Assessment:      1. Acute viral pharyngitis             Plan:      Symtomatic treatment for the URI symptoms: Tylenol / Advil, salt water gargles, increase fluids.  May also use: cough suppressant of choice, Robitussin DM or Delsym.  Can make appointment of symptoms worsen or fail to improve over the next 3-4 days.  Most viral illnesses last 7-10 days, and antibiotics do not help.      Prescription for antibiotic given but patient instructed not to start this medication unless worsening symptoms, patient agrees and verbalizes understanding     Fu 1 week prn

## 2016-06-02 NOTE — Telephone Encounter (Signed)
Error

## 2016-06-26 ENCOUNTER — Ambulatory Visit
Admit: 2016-06-26 | Discharge: 2016-06-26 | Payer: BLUE CROSS/BLUE SHIELD | Attending: Family Medicine | Primary: Family Medicine

## 2016-06-26 DIAGNOSIS — H9202 Otalgia, left ear: Secondary | ICD-10-CM

## 2016-06-26 MED ORDER — BETAMETHASONE DIPROPIONATE 0.05 % EX OINT
0.05 % | CUTANEOUS | 0 refills | Status: AC
Start: 2016-06-26 — End: ?

## 2016-06-26 NOTE — Progress Notes (Signed)
Subjective:      Patient ID: Angelica Jones is a 31 y.o. female.    Otalgia    There is pain in the left ear. This is a chronic problem. The current episode started more than 1 month ago. The problem occurs constantly. The problem has been unchanged. The pain is mild. Associated symptoms include a rash. Pertinent negatives include no abdominal pain, coughing, diarrhea, ear discharge, headaches, hearing loss, neck pain, rhinorrhea, sore throat or vomiting. She has tried nothing for the symptoms. There is no history of a chronic ear infection. recurrent cellulitis   Rash   This is a new problem. The current episode started in the past 7 days. The problem is unchanged. Location: R face. The rash is characterized by itchiness, redness and scaling. She was exposed to nothing ([facility] with [facility]ul[facility] at [facility] with [facility], ). Pertinent negatives include no anorexia, congestion, cough, diarrhea, eye pain, facial edema, fatigue, fever, rhinorrhea, sore throat or vomiting. Past treatments include nothing. (Recurrent cellulitis)   Mental Health Problem   The primary symptoms include dysphoric mood. The primary symptoms do not include delusions, hallucinations, bizarre behavior, disorganized speech, negative symptoms or somatic symptoms. Primary symptoms comment: increase stress, will be moving to [place] . The current episode started more than 1 month ago. This is a chronic problem.   The onset of the illness is precipitated by emotional stress. The degree of incapacity that she is experiencing as a consequence of her illness is mild. Additional symptoms of the illness do not include anhedonia, insomnia, hypersomnia, appetite change, unexpected weight change, fatigue, agitation, psychomotor retardation, feelings of worthlessness, attention impairment, euphoric mood, increased goal-directed activity, flight of ideas, inflated self-esteem, decreased need for sleep, distractible, poor judgment, visual change, headaches, abdominal pain or  seizures. She does not have a plan to commit suicide. She has not already injured self. She does not contemplate injuring another person. She has not already  injured another person. Risk factors that are present for mental illness include a history of mental illness.   resistant to med "all make me gain wt"      Review of Systems   Constitutional: Negative for appetite change, fatigue, fever and unexpected weight change.   HENT: Positive for ear pain. Negative for congestion, ear discharge, hearing loss, rhinorrhea and sore throat.    Eyes: Negative for pain.   Respiratory: Negative for cough.    Gastrointestinal: Negative for abdominal pain, anorexia, diarrhea and vomiting.   Musculoskeletal: Negative for neck pain.   Skin: Positive for rash.   Neurological: Negative for seizures and headaches.   Psychiatric/Behavioral: Positive for dysphoric mood. Negative for agitation and hallucinations. The patient does not have insomnia.      is allergic to betadine [povidone iodine]; clindamycin/lincomycin; metformin and related; morphine; prednisone; and victoza [liraglutide].      Current Outpatient Prescriptions:   ???  betamethasone dipropionate (DIPROLENE) 0.05 % ointment, Apply topically daily., Disp: 15 g, Rfl: 0  ???  penicillin G benzathine (BICILLIN L-A) 1200000 UNIT/2ML SUSP, Inject 1.2 Million Units into the muscle once, Disp: , Rfl:   ???  Multiple Vitamins-Minerals (THERAPEUTIC MULTIVITAMIN-MINERALS) tablet, Take 1 tablet by mouth daily, Disp: , Rfl:   ???  Acetaminophen (TYLENOL) 325 MG CAPS, Take by mouth, Disp: , Rfl:   ???  fluconazole (DIFLUCAN) 150 MG tablet, Take Diflucan 150 mg now; repeat when Flagyl finished., Disp: , Rfl:      has a past medical history of Anxiety; Bipolar disorder (  HCC); Blepharitis of both eyes; Cellulitis; Chronic back pain; Depression; Hiatal hernia; Hypertension; Lymphedema; Morbid obesity with BMI of 50.0-59.9, adult (HCC); Pancreatitis; Polycystic ovarian disease; and Urticaria.    Past  Surgical History:   Procedure Laterality Date   ??? ABDOMEN SURGERY     ??? CARPAL TUNNEL RELEASE Left    ??? CHOLECYSTECTOMY     ??? GASTRIC BYPASS SURGERY     ??? KNEE SURGERY Right         reports that she has never smoked. She has never used smokeless tobacco. She reports that she drinks alcohol. She reports that she does not use drugs.    family history includes Diabetes in her father; High Blood Pressure in her father and mother; Kidney Disease in her father; Seizures in her mother.    Objective:   Physical Exam   Constitutional: She is oriented to person, place, and time. She appears well-developed.   HENT:   Head: Normocephalic.   Right Ear: External ear normal.   Left Ear: External ear normal.   Nose: Nose normal.   Mouth/Throat: Oropharynx is clear and moist. No oropharyngeal exudate.   LF pinna mildly tender but no signs of infection     Eyes: Conjunctivae and EOM are normal. Pupils are equal, round, and reactive to light. No scleral icterus.   Neck: Normal range of motion. Neck supple.   Cardiovascular: Normal rate, regular rhythm and normal heart sounds.    Pulmonary/Chest: Effort normal and breath sounds normal. No respiratory distress. She has no wheezes.   Lymphadenopathy:     She has no cervical adenopathy.   Neurological: She is alert and oriented to person, place, and time. No cranial nerve deficit. She exhibits normal muscle tone. Coordination normal.   Skin: Skin is warm. Rash noted. She is not diaphoretic. No erythema.   R face with scaly patches no fluctuance or pain      Psychiatric: She has a normal mood and affect. Her behavior is normal. Judgment and thought content normal.   Nursing note and vitals reviewed.      Assessment:      1. Otalgia, left     2. Dermatitis  betamethasone dipropionate (DIPROLENE) 0.05 % ointment   3. GAD (generalized anxiety disorder)  Amb External Referral To Psychology           Plan:      1. No signs of infection   Do not put piercing on affected area  Tylenol prn   Fu in  1 mo prn     2. Skin care  Cellulitis resolved  Short trial with topical steroid  Patient to call if symptoms persist or worsen.     3. Referral to psychologist

## 2016-09-03 ENCOUNTER — Ambulatory Visit
Admit: 2016-09-03 | Discharge: 2016-09-03 | Payer: BLUE CROSS/BLUE SHIELD | Attending: Family Medicine | Primary: Family Medicine

## 2016-09-03 DIAGNOSIS — R635 Abnormal weight gain: Secondary | ICD-10-CM | POA: Insufficient documentation

## 2016-09-03 DIAGNOSIS — I10 Essential (primary) hypertension: Secondary | ICD-10-CM

## 2016-09-03 MED ORDER — LORAZEPAM 1 MG PO TABS
1 MG | ORAL_TABLET | Freq: Two times a day (BID) | ORAL | 0 refills | Status: AC
Start: 2016-09-03 — End: 2016-09-13

## 2016-09-03 MED ORDER — HYDROCHLOROTHIAZIDE 25 MG PO TABS
25 MG | ORAL_TABLET | Freq: Every day | ORAL | 1 refills | Status: AC
Start: 2016-09-03 — End: ?

## 2016-09-03 NOTE — Progress Notes (Signed)
Subjective:      Patient ID: Angelica Jones is a 31 y.o. female.    Here for follow up from ED visit  Reason for visit: ha, htn  Diagnosis given: ha , htn  Treatment: none  Work up: none  Recommended follow up: 1 week   Now with following symptoms         Hypertension   This is a recurrent problem. The current episode started in the past 7 days. The problem is unchanged. The problem is uncontrolled. Associated symptoms include anxiety, headaches and malaise/fatigue. Pertinent negatives include no blurred vision, chest pain, neck pain, orthopnea, palpitations, peripheral edema, PND, shortness of breath or sweats. There are no associated agents to hypertension. Risk factors for coronary artery disease include obesity and stress. Past treatments include nothing. Compliance problems include diet and exercise.  There is no history of kidney disease, CAD/MI, heart failure or left ventricular hypertrophy. There is no history of sleep apnea or a thyroid problem.   Mental Health Problem   The primary symptoms include dysphoric mood and somatic symptoms. The primary symptoms do not include delusions, hallucinations, bizarre behavior, disorganized speech or negative symptoms. Primary symptoms comment: stress, panic attacks, pt is moving out of town . The current episode started more than 1 month ago. This is a chronic problem.   The somatic symptoms began more than 1 month ago. The somatic symptoms have been unchanged since their onset. The symptoms are moderate. Somatic symptoms include headaches. Somatic symptoms do not include fatigue, abdominal pain, heartburn or myalgias.     The onset of the illness is precipitated by emotional stress. The degree of incapacity that she is experiencing as a consequence of her illness is severe. Sequelae of the illness include an inability to work. Additional symptoms of the illness include insomnia, appetite change, unexpected weight change (wt gain, 20 Lg "stress eater") and headaches.  Additional symptoms of the illness do not include anhedonia, hypersomnia, fatigue, psychomotor retardation, feelings of worthlessness, attention impairment, decreased need for sleep, distractible, poor judgment or abdominal pain. She does not admit to suicidal ideas. She does not have a plan to commit suicide. She does not contemplate harming herself. She has not already injured self. She does not contemplate injuring another person. She has not already  injured another person. Risk factors that are present for mental illness include a history of mental illness.   Headache    This is a recurrent problem. The current episode started in the past 7 days. The problem occurs intermittently. The problem has been unchanged. The pain is located in the frontal region. The pain does not radiate. The pain quality is similar to prior headaches. The quality of the pain is described as throbbing and squeezing. The pain is moderate. Associated symptoms include insomnia. Pertinent negatives include no abdominal pain, blurred vision, nausea, neck pain, phonophobia, photophobia, vomiting or weakness.       Review of Systems   Constitutional: Positive for appetite change, malaise/fatigue and unexpected weight change (wt gain, 20 Lg "stress eater"). Negative for fatigue.   Eyes: Negative for blurred vision and photophobia.   Respiratory: Negative for shortness of breath.    Cardiovascular: Negative for chest pain, palpitations, orthopnea and PND.   Gastrointestinal: Negative for abdominal pain, heartburn, nausea and vomiting.   Musculoskeletal: Negative for myalgias and neck pain.   Neurological: Positive for headaches. Negative for weakness.   Psychiatric/Behavioral: Positive for dysphoric mood. Negative for hallucinations. The patient has insomnia.  is allergic to betadine [povidone iodine]; clindamycin/lincomycin; metformin and related; morphine; prednisone; and victoza [liraglutide].      Current Outpatient Prescriptions:   .   LORazepam (ATIVAN) 1 MG tablet, Take 1 tablet by mouth 2 times daily for 10 days.., Disp: 20 tablet, Rfl: 0  .  hydrochlorothiazide (HYDRODIURIL) 25 MG tablet, Take 1 tablet by mouth daily, Disp: 30 tablet, Rfl: 1  .  penicillin G benzathine (BICILLIN L-A) 1200000 UNIT/2ML SUSP, Inject 1.2 Million Units into the muscle once, Disp: , Rfl:   .  betamethasone dipropionate (DIPROLENE) 0.05 % ointment, Apply topically daily., Disp: 15 g, Rfl: 0  .  fluconazole (DIFLUCAN) 150 MG tablet, Take Diflucan 150 mg now; repeat when Flagyl finished., Disp: , Rfl:   .  Multiple Vitamins-Minerals (THERAPEUTIC MULTIVITAMIN-MINERALS) tablet, Take 1 tablet by mouth daily, Disp: , Rfl:   .  Acetaminophen (TYLENOL) 325 MG CAPS, Take by mouth, Disp: , Rfl:      has a past medical history of Anxiety; Bipolar disorder (HCC); Blepharitis of both eyes; Cellulitis; Chronic back pain; Depression; Hiatal hernia; Hypertension; Lymphedema; Morbid obesity with BMI of 50.0-59.9, adult (HCC); Pancreatitis; Polycystic ovarian disease; and Urticaria.    Past Surgical History:   Procedure Laterality Date   . ABDOMEN SURGERY     . CARPAL TUNNEL RELEASE Left    . CHOLECYSTECTOMY     . GASTRIC BYPASS SURGERY     . KNEE SURGERY Right         reports that she has never smoked. She has never used smokeless tobacco. She reports that she drinks alcohol. She reports that she does not use drugs.    family history includes Diabetes in her father; High Blood Pressure in her father and mother; Kidney Disease in her father; Seizures in her mother.    Objective:   Physical Exam   Constitutional: She is oriented to person, place, and time. She appears well-developed and well-nourished. No distress.   HENT:   Head: Normocephalic and atraumatic.   Right Ear: External ear normal.   Left Ear: External ear normal.   Mouth/Throat: Oropharynx is clear and moist. No oropharyngeal exudate.   Eyes: Conjunctivae and EOM are normal. Pupils are equal, round, and reactive to light.  No scleral icterus.   Neck: Normal range of motion. Neck supple. No JVD present. No thyromegaly present.   No carotid bruits   Cardiovascular: Normal rate, regular rhythm, normal heart sounds and intact distal pulses.  Exam reveals no gallop and no friction rub.    No murmur heard.  Pulses:       Carotid pulses are 2+ on the right side, and 2+ on the left side.  No edema     Pulmonary/Chest: Effort normal and breath sounds normal. No respiratory distress. She has no wheezes. She has no rales. She exhibits no tenderness.   Abdominal: Soft. Bowel sounds are normal. She exhibits no distension. There is no tenderness.   Musculoskeletal: She exhibits no edema or tenderness.   Lymphadenopathy:     She has no cervical adenopathy.   Neurological: She is alert and oriented to person, place, and time. She has normal reflexes. She is not disoriented. She displays no tremor and normal reflexes. No cranial nerve deficit. She exhibits normal muscle tone. Coordination and gait normal. GCS eye subscore is 4. GCS verbal subscore is 5. GCS motor subscore is 6.   Reflex Scores:       Tricep reflexes are 2+ on the right  side and 2+ on the left side.       Bicep reflexes are 2+ on the right side and 2+ on the left side.       Brachioradialis reflexes are 2+ on the right side and 2+ on the left side.       Patellar reflexes are 2+ on the right side and 2+ on the left side.       Achilles reflexes are 2+ on the right side and 2+ on the left side.  Skin: Skin is warm. No rash noted. She is not diaphoretic. No erythema. No pallor.   Psychiatric: She has a normal mood and affect. Her behavior is normal. Judgment and thought content normal.   Nursing note and vitals reviewed.      Assessment:       Diagnosis Orders   1. Reactive hypertension  hydrochlorothiazide (HYDRODIURIL) 25 MG tablet   2. Panic attack  LORazepam (ATIVAN) 1 MG tablet   3. Weight gain       4. Tension ha        Plan:      1. hctz   encourage low sodium diet   2. Refill  ativan prn ONLY for severe panic attack  3.  Counseling: encourage to adjust caloric and fat  intake to maintain or achieve ideal body weight, to emphasize fruits, vegetables, whole grains, if possible drink  vegetable milks, reduce meats, poultry, fish, and rich in legume like beans,lentus, chickpeas ,  nuts. Exercise is a life-long commitment.   4. Tylenol prn     Pt is moving out of town in 20 days, she is to fu with new pcp     patient agrees and verbalizes understanding    Patient to call if symptoms persist or worsen.

## 2016-10-28 ENCOUNTER — Ambulatory Visit (INDEPENDENT_AMBULATORY_CARE_PROVIDER_SITE_OTHER): Payer: BLUE CROSS/BLUE SHIELD | Admitting: Internal Medicine

## 2016-10-28 ENCOUNTER — Encounter: Payer: Self-pay | Admitting: Internal Medicine

## 2016-10-28 DIAGNOSIS — E282 Polycystic ovarian syndrome: Secondary | ICD-10-CM | POA: Diagnosis not present

## 2016-10-28 DIAGNOSIS — I890 Lymphedema, not elsewhere classified: Secondary | ICD-10-CM | POA: Diagnosis not present

## 2016-10-28 DIAGNOSIS — F418 Other specified anxiety disorders: Secondary | ICD-10-CM | POA: Diagnosis not present

## 2016-10-28 DIAGNOSIS — I10 Essential (primary) hypertension: Secondary | ICD-10-CM | POA: Insufficient documentation

## 2016-10-28 DIAGNOSIS — Z9884 Bariatric surgery status: Secondary | ICD-10-CM | POA: Diagnosis not present

## 2016-10-28 DIAGNOSIS — L03119 Cellulitis of unspecified part of limb: Secondary | ICD-10-CM | POA: Insufficient documentation

## 2016-10-28 DIAGNOSIS — F319 Bipolar disorder, unspecified: Secondary | ICD-10-CM | POA: Insufficient documentation

## 2016-10-28 NOTE — Progress Notes (Signed)
[facility]  Reason for Consult: Recurrent lower extremity cellulitis Referring Physician: Dr. Twana FirstSilvania Ng  Patient Active Problem List   Diagnosis Date Noted  . 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    Priority: High  . Morbid obesity (HCC) 10/28/2016  . Status post gastric bypass for obesity 10/28/2016  . Lymphedema of lower extremity 10/28/2016  . Depression with anxiety 10/28/2016  . Hypertension 10/28/2016  . Polycystic ovary syndrome 10/28/2016    Patient's Medications  New Prescriptions   No medications on file  Previous Medications   ACETAMINOPHEN 325 MG CAPS    Take by mouth.   PENICILLIN G BENZATHINE & PROC (BICILLIN C-R IM)    Inject into the muscle.  Modified Medications   No medications on file  Discontinued Medications   No medications on file    Recommendations: 1. She is weighing her options and will consider continuing IM penicillin injections every 3 weeks versus stopping it and using self-directed oral penicillin at the first signs of recurrent cellulitis   Assessment: Kristie Hendricks has a history of severe, recurrent cellulitis of both lower extremities. Her risk factors have been obesity, lymphedema and tinea pedis. Given the number of episodes she has had and the frequency of hospitalization it is reasonable to consider continuing prophylactic IM Bicillin. She certainly has had a drastic reduction in episodes since starting this regimen in 2015. However she has also lost a significant amount of weight and successfully treated her tinea pedis. Both of these interventions could possibly reduce the likelihood of recurrences. Since she missed her last month's dose of IM Bicillin she has been considering switching back to self-directed oral penicillin therapy at the first sign of a recurrence. She tells me that she would like to discuss this further with her mother before making a decision on how to proceed.   HPI: Kristie ReinLisa  Hendricks is a 31 y.o. female with a history of morbid obesity who began having episodes of recurrent lower extremity cellulitis in 2005. She estimates that she's had about 50 episodes since that time. She has been hospitalized at least 10 times for up to one week. She has never had any abscess formation and has not required any surgery for her infections. She began to develop chronic dependent lymphedema in both legs in 2012. She says that she cannot tolerate compressive stockings because of pain. She underwent gastric bypass surgery for morbid obesity in March 2016. She weighed about 350 pounds at that time and lost down to around 215 pounds she recently moved here from [place] to take a better job with [employer]. She states that the stress of the move his caused her to gain some of that weight back but she plans on losing it again. She had bad athlete's foot but was treated several years ago and it resolved.  She started on prophylactic oral penicillin in 2013 but had difficulty adhering to the regimen. She continued to have breakthrough episodes of cellulitis. She started on IM benzathine penicillin every 4 weeks in 2014 but continued to have occasional breakthrough episodes toward the end of the month. Her last bout of cellulitis was in January 2015. She was hospitalized and then released on IV penicillin. She was then started on IM benzathine penicillin every 3 weeks and she has not had any recurrent episodes since that time. However she missed her last dose last month because of the move and has not had  any problems so far. She states that the first signs of recurrent cellulitis are generally headache, neck and back pain, fever, nausea and vomiting. She always travels with a supply of oral penicillin to take at the first sign of recurrent cellulitis.  Review of Systems: Review of Systems  Constitutional: Negative for chills, diaphoresis, fever, malaise/fatigue and weight loss.  HENT:  Negative for sore throat.   Respiratory: Negative for cough, sputum production and shortness of breath.   Cardiovascular: Negative for chest pain.  Gastrointestinal: Negative for abdominal pain, diarrhea, heartburn, nausea and vomiting.  Genitourinary: Negative for dysuria and frequency.  Musculoskeletal: Negative for joint pain and myalgias.  Skin: Negative for rash.       Recent sunburn.  Neurological: Negative for dizziness and headaches.  Psychiatric/Behavioral: Positive for depression. Negative for substance abuse. The patient is nervous/anxious.       History reviewed. No pertinent past medical history.  Social History  Substance Use Topics  . Smoking status: Never Smoker  . Smokeless tobacco: Never Used  . Alcohol use Yes    History reviewed. No pertinent family history. Allergies  Allergen Reactions  . Clindamycin Anaphylaxis  . Prednisone Other (See Comments)    States any steroids pancreatitis  . Dexamethasone Nausea And Vomiting  . Metformin And Related Other (See Comments)    Severe diarrhea  . Povidone Iodine Hives and Rash    OBJECTIVE: Vitals:   10/28/16 0919  BP: 134/83  Pulse: 67  Temp: 98.1 F (36.7 C)  TempSrc: Oral  Weight: 238 lb (108 kg)  Height: 5\' 7"  (1.702 m)   Body mass index is 37.28 kg/m.   Physical Exam  Constitutional: She is oriented to person, place, and time.  She is in good spirits. She has acute sunburn especially on her upper back.  HENT:  Mouth/Throat: No oropharyngeal exudate.  Eyes: Conjunctivae are normal.  Cardiovascular: Normal rate and regular rhythm.   No murmur heard. Pulmonary/Chest: Breath sounds normal.  Abdominal: Soft. She exhibits no mass. There is no tenderness.  Musculoskeletal: Normal range of motion. She exhibits no edema or tenderness.  Neurological: She is alert and oriented to person, place, and time. Gait normal.  Skin: No rash noted.  She has mild venous stasis dermatitis on both lower legs.  She has very mild dry skin on her feet but no evidence of onychomycosis or tinea infection.  Psychiatric: Mood and affect normal.    Microbiology: No results found for this or any previous visit (from the past 240 hour(s)).  Cliffton Asters, MD [facility] [phone] pager   [phone] cell 10/28/2016, 10:54 AM

## 2016-11-13 ENCOUNTER — Ambulatory Visit: Payer: BLUE CROSS/BLUE SHIELD | Admitting: Clinical

## 2016-11-14 ENCOUNTER — Ambulatory Visit (INDEPENDENT_AMBULATORY_CARE_PROVIDER_SITE_OTHER): Payer: BLUE CROSS/BLUE SHIELD | Admitting: Clinical

## 2016-11-14 DIAGNOSIS — F332 Major depressive disorder, recurrent severe without psychotic features: Secondary | ICD-10-CM | POA: Diagnosis not present

## 2016-11-19 ENCOUNTER — Ambulatory Visit (INDEPENDENT_AMBULATORY_CARE_PROVIDER_SITE_OTHER): Payer: BLUE CROSS/BLUE SHIELD | Admitting: Clinical

## 2016-11-19 DIAGNOSIS — F332 Major depressive disorder, recurrent severe without psychotic features: Secondary | ICD-10-CM | POA: Diagnosis not present

## 2016-12-01 ENCOUNTER — Ambulatory Visit (INDEPENDENT_AMBULATORY_CARE_PROVIDER_SITE_OTHER): Payer: BLUE CROSS/BLUE SHIELD | Admitting: Clinical

## 2016-12-01 DIAGNOSIS — F332 Major depressive disorder, recurrent severe without psychotic features: Secondary | ICD-10-CM | POA: Diagnosis not present

## 2016-12-10 ENCOUNTER — Ambulatory Visit (INDEPENDENT_AMBULATORY_CARE_PROVIDER_SITE_OTHER): Payer: BLUE CROSS/BLUE SHIELD | Admitting: Clinical

## 2016-12-10 DIAGNOSIS — F332 Major depressive disorder, recurrent severe without psychotic features: Secondary | ICD-10-CM | POA: Diagnosis not present

## 2016-12-24 ENCOUNTER — Ambulatory Visit (INDEPENDENT_AMBULATORY_CARE_PROVIDER_SITE_OTHER): Payer: BLUE CROSS/BLUE SHIELD | Admitting: Clinical

## 2016-12-24 DIAGNOSIS — F332 Major depressive disorder, recurrent severe without psychotic features: Secondary | ICD-10-CM | POA: Diagnosis not present

## 2016-12-30 ENCOUNTER — Ambulatory Visit (INDEPENDENT_AMBULATORY_CARE_PROVIDER_SITE_OTHER): Payer: BLUE CROSS/BLUE SHIELD | Admitting: Clinical

## 2016-12-30 DIAGNOSIS — F332 Major depressive disorder, recurrent severe without psychotic features: Secondary | ICD-10-CM | POA: Diagnosis not present

## 2017-01-08 ENCOUNTER — Ambulatory Visit (INDEPENDENT_AMBULATORY_CARE_PROVIDER_SITE_OTHER): Payer: BLUE CROSS/BLUE SHIELD | Admitting: Clinical

## 2017-01-08 DIAGNOSIS — F332 Major depressive disorder, recurrent severe without psychotic features: Secondary | ICD-10-CM | POA: Diagnosis not present

## 2017-01-15 ENCOUNTER — Ambulatory Visit (INDEPENDENT_AMBULATORY_CARE_PROVIDER_SITE_OTHER): Payer: BLUE CROSS/BLUE SHIELD | Admitting: Clinical

## 2017-01-15 DIAGNOSIS — F332 Major depressive disorder, recurrent severe without psychotic features: Secondary | ICD-10-CM

## 2017-01-21 ENCOUNTER — Ambulatory Visit (INDEPENDENT_AMBULATORY_CARE_PROVIDER_SITE_OTHER): Payer: BLUE CROSS/BLUE SHIELD | Admitting: Clinical

## 2017-01-21 DIAGNOSIS — F332 Major depressive disorder, recurrent severe without psychotic features: Secondary | ICD-10-CM

## 2017-01-28 ENCOUNTER — Ambulatory Visit (INDEPENDENT_AMBULATORY_CARE_PROVIDER_SITE_OTHER): Payer: BLUE CROSS/BLUE SHIELD | Admitting: Clinical

## 2017-01-28 DIAGNOSIS — F332 Major depressive disorder, recurrent severe without psychotic features: Secondary | ICD-10-CM

## 2017-02-03 ENCOUNTER — Ambulatory Visit: Payer: BLUE CROSS/BLUE SHIELD | Admitting: Clinical

## 2017-02-05 ENCOUNTER — Ambulatory Visit (INDEPENDENT_AMBULATORY_CARE_PROVIDER_SITE_OTHER): Payer: BLUE CROSS/BLUE SHIELD | Admitting: Clinical

## 2017-02-05 DIAGNOSIS — F332 Major depressive disorder, recurrent severe without psychotic features: Secondary | ICD-10-CM

## 2017-02-11 ENCOUNTER — Ambulatory Visit: Payer: BLUE CROSS/BLUE SHIELD | Admitting: Clinical

## 2017-02-17 DIAGNOSIS — M7061 Trochanteric bursitis, right hip: Secondary | ICD-10-CM | POA: Diagnosis not present

## 2017-02-17 DIAGNOSIS — M545 Low back pain: Secondary | ICD-10-CM | POA: Diagnosis not present

## 2017-02-17 DIAGNOSIS — M7062 Trochanteric bursitis, left hip: Secondary | ICD-10-CM | POA: Diagnosis not present

## 2017-02-18 ENCOUNTER — Ambulatory Visit (INDEPENDENT_AMBULATORY_CARE_PROVIDER_SITE_OTHER): Payer: BLUE CROSS/BLUE SHIELD | Admitting: Clinical

## 2017-02-18 DIAGNOSIS — F332 Major depressive disorder, recurrent severe without psychotic features: Secondary | ICD-10-CM

## 2017-02-24 DIAGNOSIS — M7062 Trochanteric bursitis, left hip: Secondary | ICD-10-CM | POA: Diagnosis not present

## 2017-02-24 DIAGNOSIS — M7061 Trochanteric bursitis, right hip: Secondary | ICD-10-CM | POA: Diagnosis not present

## 2017-02-24 DIAGNOSIS — M533 Sacrococcygeal disorders, not elsewhere classified: Secondary | ICD-10-CM | POA: Diagnosis not present

## 2017-02-25 ENCOUNTER — Ambulatory Visit (INDEPENDENT_AMBULATORY_CARE_PROVIDER_SITE_OTHER): Payer: BLUE CROSS/BLUE SHIELD | Admitting: Clinical

## 2017-02-25 DIAGNOSIS — F3171 Bipolar disorder, in partial remission, most recent episode hypomanic: Secondary | ICD-10-CM

## 2017-03-03 DIAGNOSIS — M7062 Trochanteric bursitis, left hip: Secondary | ICD-10-CM | POA: Diagnosis not present

## 2017-03-03 DIAGNOSIS — M7061 Trochanteric bursitis, right hip: Secondary | ICD-10-CM | POA: Diagnosis not present

## 2017-03-03 DIAGNOSIS — M533 Sacrococcygeal disorders, not elsewhere classified: Secondary | ICD-10-CM | POA: Diagnosis not present

## 2017-03-04 ENCOUNTER — Ambulatory Visit (INDEPENDENT_AMBULATORY_CARE_PROVIDER_SITE_OTHER): Payer: BLUE CROSS/BLUE SHIELD | Admitting: Clinical

## 2017-03-04 DIAGNOSIS — F332 Major depressive disorder, recurrent severe without psychotic features: Secondary | ICD-10-CM | POA: Diagnosis not present

## 2017-03-05 DIAGNOSIS — M7061 Trochanteric bursitis, right hip: Secondary | ICD-10-CM | POA: Diagnosis not present

## 2017-03-05 DIAGNOSIS — M7062 Trochanteric bursitis, left hip: Secondary | ICD-10-CM | POA: Diagnosis not present

## 2017-03-05 DIAGNOSIS — M533 Sacrococcygeal disorders, not elsewhere classified: Secondary | ICD-10-CM | POA: Diagnosis not present

## 2017-03-10 ENCOUNTER — Ambulatory Visit (INDEPENDENT_AMBULATORY_CARE_PROVIDER_SITE_OTHER): Payer: BLUE CROSS/BLUE SHIELD | Admitting: Clinical

## 2017-03-10 DIAGNOSIS — M533 Sacrococcygeal disorders, not elsewhere classified: Secondary | ICD-10-CM | POA: Diagnosis not present

## 2017-03-10 DIAGNOSIS — M7062 Trochanteric bursitis, left hip: Secondary | ICD-10-CM | POA: Diagnosis not present

## 2017-03-10 DIAGNOSIS — F332 Major depressive disorder, recurrent severe without psychotic features: Secondary | ICD-10-CM | POA: Diagnosis not present

## 2017-03-10 DIAGNOSIS — M7061 Trochanteric bursitis, right hip: Secondary | ICD-10-CM | POA: Diagnosis not present

## 2017-03-16 DIAGNOSIS — M7061 Trochanteric bursitis, right hip: Secondary | ICD-10-CM | POA: Diagnosis not present

## 2017-03-16 DIAGNOSIS — M7062 Trochanteric bursitis, left hip: Secondary | ICD-10-CM | POA: Diagnosis not present

## 2017-03-16 DIAGNOSIS — M533 Sacrococcygeal disorders, not elsewhere classified: Secondary | ICD-10-CM | POA: Diagnosis not present

## 2017-03-17 ENCOUNTER — Ambulatory Visit (INDEPENDENT_AMBULATORY_CARE_PROVIDER_SITE_OTHER): Payer: BLUE CROSS/BLUE SHIELD | Admitting: Clinical

## 2017-03-17 DIAGNOSIS — F332 Major depressive disorder, recurrent severe without psychotic features: Secondary | ICD-10-CM

## 2017-03-17 DIAGNOSIS — M47818 Spondylosis without myelopathy or radiculopathy, sacral and sacrococcygeal region: Secondary | ICD-10-CM | POA: Diagnosis not present

## 2017-03-17 DIAGNOSIS — M545 Low back pain: Secondary | ICD-10-CM | POA: Diagnosis not present

## 2017-03-17 DIAGNOSIS — M7062 Trochanteric bursitis, left hip: Secondary | ICD-10-CM | POA: Diagnosis not present

## 2017-03-17 DIAGNOSIS — M7061 Trochanteric bursitis, right hip: Secondary | ICD-10-CM | POA: Diagnosis not present

## 2017-03-20 DIAGNOSIS — M25551 Pain in right hip: Secondary | ICD-10-CM | POA: Diagnosis not present

## 2017-03-20 DIAGNOSIS — M545 Low back pain: Secondary | ICD-10-CM | POA: Diagnosis not present

## 2017-03-20 DIAGNOSIS — M25552 Pain in left hip: Secondary | ICD-10-CM | POA: Diagnosis not present

## 2017-03-24 ENCOUNTER — Ambulatory Visit: Payer: BLUE CROSS/BLUE SHIELD | Admitting: Clinical

## 2017-03-25 ENCOUNTER — Ambulatory Visit (INDEPENDENT_AMBULATORY_CARE_PROVIDER_SITE_OTHER): Payer: BLUE CROSS/BLUE SHIELD | Admitting: Clinical

## 2017-03-25 DIAGNOSIS — F332 Major depressive disorder, recurrent severe without psychotic features: Secondary | ICD-10-CM | POA: Diagnosis not present

## 2017-03-26 ENCOUNTER — Ambulatory Visit (INDEPENDENT_AMBULATORY_CARE_PROVIDER_SITE_OTHER): Payer: BLUE CROSS/BLUE SHIELD | Admitting: Internal Medicine

## 2017-03-26 ENCOUNTER — Encounter: Payer: Self-pay | Admitting: Internal Medicine

## 2017-03-26 DIAGNOSIS — M25552 Pain in left hip: Secondary | ICD-10-CM | POA: Diagnosis not present

## 2017-03-26 DIAGNOSIS — M545 Low back pain: Secondary | ICD-10-CM | POA: Diagnosis not present

## 2017-03-26 DIAGNOSIS — L03119 Cellulitis of unspecified part of limb: Secondary | ICD-10-CM | POA: Diagnosis not present

## 2017-03-26 DIAGNOSIS — M25551 Pain in right hip: Secondary | ICD-10-CM | POA: Diagnosis not present

## 2017-03-26 MED ORDER — PENICILLIN G BENZATHINE 1200000 UNIT/2ML IM SUSP
1.2000 10*6.[IU] | INTRAMUSCULAR | Status: DC
  Administered 2017-03-26: 1.2 10*6.[IU] via INTRAMUSCULAR

## 2017-03-26 NOTE — Progress Notes (Signed)
[facility]  Patient Active Problem List   Diagnosis Date Noted  . 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    Priority: High  . Morbid obesity (HCC) 10/28/2016  . Status post gastric bypass for obesity 10/28/2016  . Lymphedema of lower extremity 10/28/2016  . Depression with anxiety 10/28/2016  . Hypertension 10/28/2016  . Polycystic ovary syndrome 10/28/2016    Patient's Medications  New Prescriptions   No medications on file  Previous Medications   ACETAMINOPHEN 325 MG CAPS    Take by mouth.  Modified Medications   No medications on file  Discontinued Medications   PENICILLIN G BENZATHINE & PROC (BICILLIN C-R IM)    Inject into the muscle.    Subjective: Kristie Hendricks is in for a routine follow-up visit for her recurrent lower extremity cellulitis. She has a history of morbid obesity who began having episodes of recurrent lower extremity cellulitis in 2005. She estimates that she's had about 50 episodes since that time. She has been hospitalized at least 10 times for up to one week. She has never had any abscess formation and has not required any surgery for her infections. She began to develop chronic dependent lymphedema in both legs in 2012. She says that she cannot tolerate compressive stockings because of pain. She underwent gastric bypass surgery for morbid obesity in March 2016. She weighed about 350 pounds at that time and lost down to around 215 pounds. She had bad athlete's foot but was treated several years ago and it resolved.  She started on prophylactic oral penicillin in 2013 but had difficulty adhering to the regimen. She continued to have breakthrough episodes of cellulitis. She started on IM benzathine penicillin every 4 weeks in 2014 but continued to have occasional breakthrough episodes toward the end of the month. Her last bout of cellulitis was in January 2015. She was hospitalized and then released on IV penicillin. She  was then started on IM benzathine penicillin every 3 weeks and she has not had any recurrent episodes since that time. However she missed her last dose last month because of the move and has not had any problems so far. She states that the first signs of recurrent cellulitis are generally headache, neck and back pain, fever, nausea and vomiting. She always travels with a supply of oral penicillin to take at the first sign of recurrent cellulitis.  [place] recently moved here from [place] to take a better job with [employer]. She is also a Physicist, medicalfull-time student taking online classes from [employer] studying to get her Human resources officermasters of science in psychology. She saw me back in March after which time she decided to stop her regular injections benzathine penicillin. She's been on no antibiotics since May of this year. She has not had any recurrent bouts of cellulitis but she has gained significant weight since that time and is very worried about the potential for recurrences. She says that she is under a great deal of stress with her work and school work. Her boyfriend is wheelchair-bound. She was recently pushing him in his wheelchair and strained her left hip. 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 She also tells me that she has bad bursitis in both hips. She has been seen at [facility] and is undergoing physical therapy. Because of her recent pain and stress she has not been able to exercise  as much and has gained weight making her more worried about recurrent cellulitis. She tells me again that she is not able to tolerate daily use of compressive stockings because of pain and skin irritation.  Review of Systems: Review of Systems  Constitutional: Negative for chills, diaphoresis, fever, malaise/fatigue and weight loss.  HENT: Negative for sore throat.   Respiratory: Negative for cough, sputum production and  shortness of breath.   Cardiovascular: Negative for chest pain.  Gastrointestinal: Negative for abdominal pain, diarrhea, heartburn, nausea and vomiting.  Genitourinary: Negative for dysuria and frequency.  Musculoskeletal: Positive for joint pain. Negative for myalgias.  Skin: Negative for rash.  Neurological: Negative for dizziness and headaches.    Past Medical History:  Diagnosis Date  . Anxiety   . Depression   . Hypertension     Social History  Substance Use Topics  . Smoking status: Never Smoker  . Smokeless tobacco: Never Used  . Alcohol use 0.6 oz/week    1 Glasses of wine per week     Comment: socially     No family history on file.  Allergies  Allergen Reactions  . Clindamycin Anaphylaxis  . Prednisone Other (See Comments)    States any steroids pancreatitis  . Dexamethasone Nausea And Vomiting  . Metformin And Related Other (See Comments)    Severe diarrhea  . Povidone Iodine Hives and Rash    Objective: Vitals:   03/26/17 0938  BP: 138/80  Pulse: 65  Temp: 98.8 F (37.1 C)  TempSrc: Oral  Weight: 285 lb (129.3 kg)   Body mass index is 44.64 kg/m.  Physical Exam  Constitutional: She is oriented to person, place, and time.  She has gained 47 pounds since her last visit.  HENT:  Mouth/Throat: No oropharyngeal exudate.  Eyes: Conjunctivae are normal.  Cardiovascular: Normal rate and regular rhythm.   No murmur heard. Pulmonary/Chest: Breath sounds normal.  Abdominal: Soft. She exhibits no mass. There is no tenderness.  Musculoskeletal: Normal range of motion.  Neurological: She is alert and oriented to person, place, and time.  Skin: No rash noted.  She has some early venous stasis changes in both lower legs but no evidence of active cellulitis. She has no evidence of athlete's foot.   Psychiatric: Mood and affect normal.    Lab Results    Problem List Items Addressed This Visit      High   Recurrent cellulitis of lower extremity     After a lengthy discussion about the pros and cons of chronic prophylactic antibiotics for recurrent cellulitis that we agreed that she would restart benzathine penicillin 1.2 million units IM every 30 days at least until she follows up here in 4 months.      Relevant Medications   penicillin g benzathine (BICILLIN LA) 1200000 UNIT/2ML injection 1.2 Million Units (Start on 03/26/2017 10:15 AM)       Cliffton Asters, MD [facility] [phone] pager   [phone] cell 03/26/2017, 10:11 AM

## 2017-03-26 NOTE — Assessment & Plan Note (Signed)
After a lengthy discussion about the pros and cons of chronic prophylactic antibiotics for recurrent cellulitis that we agreed that she would restart benzathine penicillin 1.2 million units IM every 30 days at least until she follows up here in 4 months.

## 2017-04-01 ENCOUNTER — Ambulatory Visit: Payer: BLUE CROSS/BLUE SHIELD | Admitting: Clinical

## 2017-04-01 DIAGNOSIS — M25552 Pain in left hip: Secondary | ICD-10-CM | POA: Diagnosis not present

## 2017-04-01 DIAGNOSIS — F332 Major depressive disorder, recurrent severe without psychotic features: Secondary | ICD-10-CM

## 2017-04-01 DIAGNOSIS — M545 Low back pain: Secondary | ICD-10-CM | POA: Diagnosis not present

## 2017-04-01 DIAGNOSIS — M25551 Pain in right hip: Secondary | ICD-10-CM | POA: Diagnosis not present

## 2017-04-05 ENCOUNTER — Emergency Department (HOSPITAL_COMMUNITY)
Admission: EM | Admit: 2017-04-05 | Discharge: 2017-04-05 | Disposition: A | Discharge status: Home / Self Care | Payer: BLUE CROSS/BLUE SHIELD | Attending: Emergency Medicine | Admitting: Emergency Medicine

## 2017-04-05 ENCOUNTER — Encounter (HOSPITAL_COMMUNITY): Payer: Self-pay

## 2017-04-05 DIAGNOSIS — Z8719 Personal history of other diseases of the digestive system: Secondary | ICD-10-CM | POA: Insufficient documentation

## 2017-04-05 DIAGNOSIS — M545 Low back pain: Secondary | ICD-10-CM | POA: Insufficient documentation

## 2017-04-05 DIAGNOSIS — R1013 Epigastric pain: Secondary | ICD-10-CM

## 2017-04-05 DIAGNOSIS — I10 Essential (primary) hypertension: Secondary | ICD-10-CM | POA: Insufficient documentation

## 2017-04-05 HISTORY — DX: Acute pancreatitis without necrosis or infection, unspecified: K85.90

## 2017-04-05 LAB — CBC
HCT: 38.6 % (ref 36.0–46.0)
HEMOGLOBIN: 13.5 g/dL (ref 12.0–15.0)
MCH: 29.9 pg (ref 26.0–34.0)
MCHC: 35 g/dL (ref 30.0–36.0)
MCV: 85.4 fL (ref 78.0–100.0)
Platelets: 254 10*3/uL (ref 150–400)
RBC: 4.52 MIL/uL (ref 3.87–5.11)
RDW: 12.7 % (ref 11.5–15.5)
WBC: 8.6 10*3/uL (ref 4.0–10.5)

## 2017-04-05 LAB — COMPREHENSIVE METABOLIC PANEL
ALBUMIN: 4 g/dL (ref 3.5–5.0)
ALK PHOS: 100 U/L (ref 38–126)
ALT: 26 U/L (ref 14–54)
AST: 20 U/L (ref 15–41)
Anion gap: 7 (ref 5–15)
BILIRUBIN TOTAL: 2.2 mg/dL — AB (ref 0.3–1.2)
BUN: 8 mg/dL (ref 6–20)
CALCIUM: 9 mg/dL (ref 8.9–10.3)
CO2: 25 mmol/L (ref 22–32)
Chloride: 105 mmol/L (ref 101–111)
Creatinine, Ser: 0.61 mg/dL (ref 0.44–1.00)
GFR calc Af Amer: >60 mL/min (ref >60)
GLUCOSE: 89 mg/dL (ref 65–99)
Potassium: 4 mmol/L (ref 3.5–5.1)
Sodium: 137 mmol/L (ref 135–145)
Total Protein: 6.8 g/dL (ref 6.5–8.1)

## 2017-04-05 LAB — URINALYSIS, ROUTINE W REFLEX MICROSCOPIC
BILIRUBIN URINE: NEGATIVE
GLUCOSE, UA: NEGATIVE mg/dL
HGB URINE DIPSTICK: NEGATIVE
Ketones, ur: NEGATIVE mg/dL
Leukocytes, UA: NEGATIVE
Nitrite: NEGATIVE
PH: 7 (ref 5.0–8.0)
Protein, ur: NEGATIVE mg/dL
SPECIFIC GRAVITY, URINE: 1.001 — AB (ref 1.005–1.030)

## 2017-04-05 LAB — LIPASE, BLOOD: Lipase: 28 U/L (ref 11–51)

## 2017-04-05 LAB — I-STAT BETA HCG BLOOD, ED (MC, WL, AP ONLY): I-stat hCG, quantitative: <5 m[IU]/mL (ref <5)

## 2017-04-05 MED ORDER — GI COCKTAIL ~~LOC~~
30.0000 mL | Freq: Once | ORAL | Status: AC
  Administered 2017-04-05: 30 mL via ORAL
  Filled 2017-04-05: qty 30

## 2017-04-05 NOTE — ED Triage Notes (Signed)
Patient developed epigastric pain with back pain x 3 days, reports nausea and abnormal stools with same, thinks related to pancreatitis, has had gastric bypass in 2016.

## 2017-04-05 NOTE — Discharge Instructions (Signed)
Please read and follow all provided instructions.  Your diagnoses today include:  1. Epigastric abdominal pain     Tests performed today include: Blood counts and electrolytes Blood tests to check liver and kidney function Blood tests to check pancreas function Urine test to look for infection and pregnancy (in women) Vital signs. See below for your results today.   Medications prescribed:   Take any prescribed medications only as directed.  Home care instructions:  Follow any educational materials contained in this packet.  Follow-up instructions: Please follow-up with your primary care provider in the next 2 days for further evaluation of your symptoms.    Return instructions:  SEEK IMMEDIATE MEDICAL ATTENTION IF: The pain does not go away or becomes severe  A temperature above 101F develops  Repeated vomiting occurs (multiple episodes)  The pain becomes localized to portions of the abdomen. The right side could possibly be appendicitis. In an adult, the left lower portion of the abdomen could be colitis or diverticulitis.  Blood is being passed in stools or vomit (bright red or black tarry stools)  You develop chest pain, difficulty breathing, dizziness or fainting, or become confused, poorly responsive, or inconsolable (young children) If you have any other emergent concerns regarding your health  Additional Information: Abdominal (belly) pain can be caused by many things. Your caregiver performed an examination and possibly ordered blood/urine tests and imaging (CT scan, x-rays, ultrasound). Many cases can be observed and treated at home after initial evaluation in the emergency department. Even though you are being discharged home, abdominal pain can be unpredictable. Therefore, you need a repeated exam if your pain does not resolve, returns, or worsens. Most patients with abdominal pain don't have to be admitted to the hospital or have surgery, but serious problems like  appendicitis and gallbladder attacks can start out as nonspecific pain. Many abdominal conditions cannot be diagnosed in one visit, so follow-up evaluations are very important.  Your vital signs today were: BP 104/84    Pulse (!) 58    Temp 98.2 F (36.8 C) (Oral)    Resp 14    Ht 5\' 7"  (1.702 m)    Wt 112.9 kg (249 lb)    LMP 03/12/2017 (Approximate)    SpO2 100%    BMI 39.00 kg/m  If your blood pressure (bp) was elevated above 135/85 this visit, please have this repeated by your doctor within one month. --------------

## 2017-04-05 NOTE — ED Notes (Signed)
This RN and Teacher, adult educationKaitlynn RN each attempted IV x1 but were unsuccessful in getting reliable access

## 2017-04-05 NOTE — ED Provider Notes (Signed)
[facility] EMERGENCY [facility] Provider Note   CSN: [id number] Arrival date & time: 04/05/17  1408     History   Chief Complaint Chief Complaint  Patient presents with  . Abdominal Pain/back pain    HPI Kristie Hendricks is a 31 y.o. female.  HPI  31 y.o. female with a hx of HTN, Pancreatitis, presents to the Emergency Department today due to epigastric pain x 3 days. Pt states that she normally gets pancreatitis when she is on steroids and tries to avoid them. Notes hat last week she received steroid injections for bursitis and felt "a twinge" in her stomach and thought it might be a flare, but managed at home. Notes that last night she drank 2 alcoholic beverages to reward herself for the week and proceeded to have epigastric pain with radiation into back. No N/V. Rates pain 6/10. Sharp sensation. No CP/SOB. No headaches. No numbness/tingling. No meds PTA. No other symptoms noted   Past Medical History:  Diagnosis Date  . Anxiety   . Depression   . Hypertension   . Pancreatitis     Patient Active Problem List   Diagnosis Date Noted  . Morbid obesity (HCC) 10/28/2016  . Status post gastric bypass for obesity 10/28/2016  . Lymphedema of lower extremity 10/28/2016  . 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  . Depression with anxiety 10/28/2016  . Hypertension 10/28/2016  . Polycystic ovary syndrome 10/28/2016    History reviewed. No pertinent surgical history.  OB History    No data available       Home Medications    Prior to Admission medications   Medication Sig Start Date End Date Taking? Authorizing Provider  Acetaminophen 325 MG CAPS Take by mouth.    [provider]    Family History No family history on file.  Social History Social History   Tobacco Use  . Smoking status: Never Smoker  . Smokeless tobacco: Never Used  Substance Use Topics  . Alcohol use: Yes    Alcohol/week: 0.6 oz    Types: 1 Glasses of wine  per week    Comment: socially   . Drug use: No     Allergies   Clindamycin; Prednisone; Dexamethasone; Metformin and related; and Povidone iodine   Review of Systems Review of Systems ROS reviewed and all are negative for acute change except as noted in the HPI.  Physical Exam Updated Vital Signs BP (!) 143/78 (BP Location: Right Arm)   Pulse 83   Temp 98.2 F (36.8 C) (Oral)   Resp 14   Ht 5\' 7"  (1.702 m)   Wt 112.9 kg (249 lb)   LMP 03/12/2017 (Approximate)   SpO2 100%   BMI 39.00 kg/m   Physical Exam  Constitutional: She is oriented to person, place, and time. Vital signs are normal. She appears well-developed and well-nourished. No distress.  HENT:  Head: Normocephalic and atraumatic. Head is without raccoon's eyes and without Battle's sign.  Right Ear: Tympanic membrane, external ear and ear canal normal. No hemotympanum.  Left Ear: Tympanic membrane, external ear and ear canal normal. No hemotympanum.  Nose: Nose normal.  Mouth/Throat: Uvula is midline, oropharynx is clear and moist and mucous membranes are normal. No trismus in the jaw. No oropharyngeal exudate, posterior oropharyngeal erythema or tonsillar abscesses.  Eyes: EOM are normal. Pupils are equal, round, and reactive to light.  Neck: Trachea normal and normal range of motion. Neck supple. No spinous process tenderness and no muscular  tenderness present. No tracheal deviation and normal range of motion present.  Cardiovascular: Normal rate, regular rhythm, S1 normal, S2 normal, normal heart sounds, intact distal pulses and normal pulses.  Pulmonary/Chest: Effort normal and breath sounds normal. No respiratory distress. She has no decreased breath sounds. She has no wheezes. She has no rhonchi. She has no rales.  Abdominal: Normal appearance and bowel sounds are normal. There is tenderness in the epigastric area. There is no rigidity, no rebound, no guarding, no CVA tenderness, no tenderness at McBurney's point  and negative Murphy's sign.  Musculoskeletal: Normal range of motion.  Neurological: She is alert and oriented to person, place, and time. She has normal strength. No cranial nerve deficit or sensory deficit.  Skin: Skin is warm and dry.  Psychiatric: She has a normal mood and affect. Her speech is normal and behavior is normal. Thought content normal.  Nursing note and vitals reviewed.    ED Treatments / Results  Labs (all labs ordered are listed, but only abnormal results are displayed) Labs Reviewed  COMPREHENSIVE METABOLIC PANEL - Abnormal; Notable for the following components:      Result Value   Total Bilirubin 2.2 (*)    All other components within normal limits  URINALYSIS, ROUTINE W REFLEX MICROSCOPIC - Abnormal; Notable for the following components:   Color, Urine STRAW (*)    Specific Gravity, Urine 1.001 (*)    All other components within normal limits  LIPASE, BLOOD  CBC  I-STAT BETA HCG BLOOD, ED ([facility], [facility], [facility] ONLY)    EKG  EKG Interpretation None       Radiology No results found.  Procedures Procedures (including critical care time)  Medications Ordered in ED Medications  gi cocktail (Maalox,Lidocaine,Donnatal) (not administered)     Initial Impression / Assessment and Plan / ED Course  I have reviewed the triage vital signs and the nursing notes.  Pertinent labs & imaging results that were available during my care of the patient were reviewed by me and considered in my medical decision making (see chart for details).  Final Clinical Impressions(s) / ED Diagnoses  {I have reviewed and evaluated the relevant laboratory values.   {I have reviewed the relevant previous healthcare records.  {I obtained HPI from historian.   ED Course:  Assessment: Pt is a 31 y.o. female with a hx of HTN, Pancreatitis, presents to the Emergency Department today due to epigastric pain x 3 days. Pt states that she normally gets pancreatitis when she is on steroids and  tries to avoid them. Notes hat last week she received steroid injections for bursitis and felt "a twinge" in her stomach and thought it might be a flare, but managed at home. Notes that last night she drank 2 alcoholic beverages to reward herself for the week and proceeded to have epigastric pain with radiation into back. No N/V. Rates pain 6/10. Sharp sensation. No CP/SOB. No headaches. No numbness/tingling. No meds PTA. On exam, pt in NAD. Nontoxic/nonseptic appearing. VSS. Afebrile. Lungs CTA. Heart RRR. Abdomen TTP epigastrum. CBC unremarkable. CMP unremarkable. Lipase negative. Possible gastritis? Pt able to tolerate PO without difficulty. Pain improved with GI cocktail. Plan is to DC home with follow up to PCP. At time of discharge, Patient is in no acute distress. Vital Signs are stable. Patient is able to ambulate. Patient able to tolerate PO.   Disposition/Plan:  DC Home Additional Verbal discharge instructions given and discussed with patient.  Pt Instructed to f/u with PCP in  the next week for evaluation and treatment of symptoms. Return precautions given Pt acknowledges and agrees with plan  Supervising Physician Rolan Bucco, MD  Final diagnoses:  Epigastric abdominal pain    ED Discharge Orders    None       Audry Pili, PA-C 04/05/17 1733    Rolan Bucco, MD 04/08/17 1125

## 2017-04-08 ENCOUNTER — Ambulatory Visit: Payer: BLUE CROSS/BLUE SHIELD | Admitting: Infectious Diseases

## 2017-04-08 ENCOUNTER — Ambulatory Visit: Payer: BLUE CROSS/BLUE SHIELD | Admitting: Clinical

## 2017-04-08 ENCOUNTER — Encounter: Payer: Self-pay | Admitting: Infectious Diseases

## 2017-04-08 VITALS — BP 144/92 | HR 61 | Temp 98.7°F | Wt 248.0 lb

## 2017-04-08 DIAGNOSIS — R1013 Epigastric pain: Secondary | ICD-10-CM | POA: Diagnosis not present

## 2017-04-08 DIAGNOSIS — R110 Nausea: Secondary | ICD-10-CM

## 2017-04-08 DIAGNOSIS — L03119 Cellulitis of unspecified part of limb: Secondary | ICD-10-CM | POA: Diagnosis not present

## 2017-04-08 DIAGNOSIS — F332 Major depressive disorder, recurrent severe without psychotic features: Secondary | ICD-10-CM

## 2017-04-08 DIAGNOSIS — R109 Unspecified abdominal pain: Secondary | ICD-10-CM | POA: Insufficient documentation

## 2017-04-08 MED ORDER — ONDANSETRON HCL 4 MG PO TABS: 4.0000 mg | ORAL_TABLET | Freq: Three times a day (TID) | ORAL | 0 refills | Status: DC | PRN

## 2017-04-08 MED ORDER — PENICILLIN V POTASSIUM 500 MG PO TABS: 500.0000 mg | ORAL_TABLET | Freq: Four times a day (QID) | ORAL | 1 refills | Status: AC

## 2017-04-08 MED ORDER — PANTOPRAZOLE SODIUM 40 MG PO TBEC: 40.0000 mg | DELAYED_RELEASE_TABLET | Freq: Every day | ORAL | 0 refills | Status: DC

## 2017-04-08 NOTE — Assessment & Plan Note (Signed)
Recurrent cellulitis on LLE calf. Will send in rx for oral penicillin v 500 mg QID. Will discuss with Dr. Orvan Falconerampbell about her request to increase frequency of injections to q3w since she failed q4w in the past.

## 2017-04-08 NOTE — Assessment & Plan Note (Signed)
She is having episodes of nausea r/t her cellulitis and possibly as reaction to steroid injection she had 2 weeks ago. Requested zofran - will give one time course to help her get through the next few days. She will work on getting primary care provider for chronic condition management.

## 2017-04-08 NOTE — Patient Instructions (Signed)
Will send in some penicillin for you to help you through this episode.   Will send message to Dr. Orvan Falconerampbell about contacting you for consideration of q3w injections for prevention.   You have 3 prescriptions ready for you to pick up .   Please sign up with MyChart to access your labs and set up email communication with our clinic for non-urgent medical concerns.

## 2017-04-08 NOTE — Assessment & Plan Note (Addendum)
Reviewed her labs and imaging personally from recent ED visit. I am concerned she has gastric/duodenal ulcer being she has had a lot of stress, some alcohol, and ibuprofen recently with her hip pain. She is s/p gastric bypass procedure. Will send in 2w course of protonix for her. Advised she should avoid ibuprofen/nsaids with her procedure to preserve GI tract.

## 2017-04-08 NOTE — Progress Notes (Signed)
Kristie Hendricks 1985-11-17 [id number]  PCP: Patient, No Pcp Per    Reason for Visit: Recurrent cellulitis / Current flare     HPI: Kristie Hendricks is a 31 y.o. female patient of Dr. Blair Dolphin that was recently seen for recurrent lower extremety cellulitis. She has struggled with this since 2005 with multiple courses of treatment including hospitalizations with IV abx. She has developed chronic lymphedema in both legs since 2012. In 2013 started on prophylactic PO PCN but struggled with adherence. Then started on Q4w benzathine pcn 1.2 million units IM without improvement. This was increased to Q3w at that time and she had significant improvement in prevention. She moved here from [place] recently and was seen by Dr. Orvan Falconer first in March 2018 - decided to stop injections to see if her condition improves. Has not been on antibiotics until May. Has not had any further episodes of recurrence however due to weight gain she was concerned she would relapse and decision was made to resume Q4w dosing of injections. Her last injection was 03/26/17.   Today she called the office requesting treatment for relapse of cellulitis. Shaved her legs with razor and soaked in tub recently at home. 2 days prior she started feeling "tingling" behind her left calf. Yesterday morning woke up with splotchy redness to this area with "tightness and tenderness." She marked the area at the time it presented and it has since spread and wrapped to the front of her shin. Denies fevers but feels chilly lately. Some nausea, abdominal pain and diarrhea as described below. Requesting PCN PO to help get her through this flare. Would also like to discuss with Dr. Orvan Falconer increasing her injections to q3w since that worked better for her in the past. Upset because she almost made 4 years since cellulitis flare.    Also notes nausea, diarrhea and urge to vomit for a few weeks now. Feels this is d/t the cortisone injections in her right hip.  Tolerated them well until recent injection 2 weeks ago when she started feeling nauseated, experiencing epigastric pain and immediate diarrhea within 10 minutes of eating. Denies fevers or vomiting although she feels like she could vomit at times. Apparently she gets pancreatitis in the setting of steroids use in the past and felt this was r/t that. She was seen in the ED a short time ago and her lipase/amylase were negative. Was given GI coctail without relief. She is s/p gastric bypass surgery and tells me she has also been taking "a lot of ibuprofen" lately for the hip pain and a few alcoholic beverages lately. Has not tried anything OTC. Requesting anti-nausea medication.    Patient Active Problem List   Diagnosis Date Noted  . Nausea 04/08/2017  . Abdominal pain 04/08/2017  . Morbid obesity (HCC) 10/28/2016  . Status post gastric bypass for obesity 10/28/2016  . Lymphedema of lower extremity 10/28/2016  . 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  . Depression with anxiety 10/28/2016  . Hypertension 10/28/2016  . Polycystic ovary syndrome 10/28/2016    Outpatient Medications Prior to Visit  Medication Sig Dispense Refill  . Acetaminophen 325 MG CAPS Take by mouth.     Facility-Administered Medications Prior to Visit  Medication Dose Route Frequency Provider Last Rate Last Dose  . penicillin g benzathine (BICILLIN LA) 1200000 UNIT/2ML injection 1.2 Million Units  1.2 Million Units Intramuscular Q30 days Cliffton Asters, MD   1.2 Million Units at 03/26/17 1019    Review of Systems  Constitutional:  Negative for chills, fever and malaise/fatigue.  Eyes: Negative for blurred vision and photophobia.  Respiratory: Negative for cough and sputum production.   Cardiovascular: Negative for chest pain.  Gastrointestinal: Positive for abdominal pain, diarrhea and nausea. Negative for vomiting.  Genitourinary: Negative for dysuria.  Musculoskeletal:       LLE pain to calf   Skin:  Negative for rash.       Cellulitis of LLE extremity to calf  Neurological: Negative for headaches.    Past Medical History:  Diagnosis Date  . Anxiety   . Depression   . Hypertension   . Pancreatitis     Social History   Tobacco Use  . Smoking status: Never Smoker  . Smokeless tobacco: Never Used  Substance Use Topics  . Alcohol use: Yes    Alcohol/week: 0.6 oz    Types: 1 Glasses of wine per week    Comment: socially   . Drug use: No    No family history on file.   Allergies  Allergen Reactions  . Clindamycin Anaphylaxis  . Prednisone Other (See Comments)    States any steroids pancreatitis  . Dexamethasone Nausea And Vomiting  . Metformin And Related Other (See Comments)    Severe diarrhea  . Povidone Iodine Hives and Rash    PHYSICAL EXAM AND DIAGNOSTIC FINDINGS:  Vitals:   04/08/17 1534  BP: (!) 144/92  Pulse: 61  Temp: 98.7 F (37.1 C)  TempSrc: Oral  Weight: 248 lb (112.5 kg)   Body mass index is 38.84 kg/m.  Physical Exam  Constitutional: She is oriented to person, place, and time and well-developed, well-nourished, and in no distress.  Pleasant caucasian young woman. Obese. In no distress and seated comfortably.   HENT:  Mouth/Throat: No oral lesions. No dental abscesses.  Cardiovascular: Normal rate, regular rhythm and normal heart sounds.  Pulmonary/Chest: Effort normal and breath sounds normal.  Abdominal: Soft. Bowel sounds are normal. She exhibits no distension. There is no hepatosplenomegaly. There is tenderness in the epigastric area. There is no guarding.  Musculoskeletal: Normal range of motion. She exhibits no tenderness.  Left calf with diffuse light erythema, mild swelling and pain with palpation. Redness has surpassed previous markings made by patient.   Neurological: She is alert and oriented to person, place, and time.  Skin: Skin is warm and dry. No rash noted.  Psychiatric: Mood, affect and judgment normal.    ASSESSMENT &  PLAN:  Problem List Items Addressed This Visit      Other   Morbid obesity (HCC)   Recurrent cellulitis of lower extremity - Primary    Recurrent cellulitis on LLE calf. Will send in rx for oral penicillin v 500 mg QID. Will discuss with Dr. Orvan Falconerampbell about her request to increase frequency of injections to q3w since she failed q4w in the past.        Relevant Medications   penicillin v potassium (VEETID) 500 MG tablet   Nausea    She is having episodes of nausea r/t her cellulitis and possibly as reaction to steroid injection she had 2 weeks ago. Requested zofran - will give one time course to help her get through the next few days. She will work on getting primary care provider for chronic condition management.       Relevant Medications   ondansetron (ZOFRAN) 4 MG tablet   Abdominal pain    Reviewed her labs and imaging personally from recent ED visit. I am concerned she has gastric/duodenal  ulcer being she has had a lot of stress, some alcohol, and ibuprofen recently with her hip pain. She is s/p gastric bypass procedure. Will send in 2w course of protonix for her. Advised she should avoid ibuprofen/nsaids with her procedure to preserve GI tract.       Relevant Medications   pantoprazole (PROTONIX) 40 MG tablet      Meds ordered this encounter  Medications  . penicillin v potassium (VEETID) 500 MG tablet    Sig: Take 1 tablet (500 mg total) 4 (four) times daily for 10 days by mouth.    Dispense:  30 tablet    Refill:  1    Order Specific Question:   Supervising Provider    Answer:   HATCHER, JEFFREY C [[id number]]  . ondansetron (ZOFRAN) 4 MG tablet    Sig: Take 1 tablet (4 mg total) every 8 (eight) hours as needed by mouth for nausea or vomiting.    Dispense:  20 tablet    Refill:  0    Order Specific Question:   Supervising Provider    Answer:   HATCHER, JEFFREY C [[id number]]  . pantoprazole (PROTONIX) 40 MG tablet    Sig: Take 1 tablet (40 mg total) daily for 14 days by mouth.     Dispense:  14 tablet    Refill:  0    Order Specific Question:   Supervising Provider    Answer:   HATCHER, JEFFREY C [[id number]]    Rexene AlbertsStephanie Jw Covin, MSN, [facility]NP-C [facility]  04/09/2017  9:45 AM

## 2017-04-21 DIAGNOSIS — M545 Low back pain: Secondary | ICD-10-CM | POA: Diagnosis not present

## 2017-04-21 DIAGNOSIS — M25552 Pain in left hip: Secondary | ICD-10-CM | POA: Diagnosis not present

## 2017-04-21 DIAGNOSIS — M25551 Pain in right hip: Secondary | ICD-10-CM | POA: Diagnosis not present

## 2017-04-22 ENCOUNTER — Ambulatory Visit: Payer: BLUE CROSS/BLUE SHIELD | Admitting: Clinical

## 2017-04-22 DIAGNOSIS — F332 Major depressive disorder, recurrent severe without psychotic features: Secondary | ICD-10-CM | POA: Diagnosis not present

## 2017-04-29 ENCOUNTER — Ambulatory Visit: Payer: BLUE CROSS/BLUE SHIELD | Admitting: Clinical

## 2017-04-29 DIAGNOSIS — M25552 Pain in left hip: Secondary | ICD-10-CM | POA: Diagnosis not present

## 2017-04-29 DIAGNOSIS — M25551 Pain in right hip: Secondary | ICD-10-CM | POA: Diagnosis not present

## 2017-04-29 DIAGNOSIS — M545 Low back pain: Secondary | ICD-10-CM | POA: Diagnosis not present

## 2017-04-30 ENCOUNTER — Ambulatory Visit: Payer: BLUE CROSS/BLUE SHIELD | Admitting: Clinical

## 2017-04-30 DIAGNOSIS — F332 Major depressive disorder, recurrent severe without psychotic features: Secondary | ICD-10-CM

## 2017-05-04 ENCOUNTER — Ambulatory Visit: Payer: Self-pay

## 2017-05-05 ENCOUNTER — Ambulatory Visit: Payer: Self-pay | Admitting: Internal Medicine

## 2017-05-06 ENCOUNTER — Ambulatory Visit: Payer: BLUE CROSS/BLUE SHIELD | Admitting: Clinical

## 2017-05-07 ENCOUNTER — Ambulatory Visit: Payer: BLUE CROSS/BLUE SHIELD | Admitting: Internal Medicine

## 2017-05-07 DIAGNOSIS — M545 Low back pain: Secondary | ICD-10-CM | POA: Diagnosis not present

## 2017-05-07 DIAGNOSIS — L03119 Cellulitis of unspecified part of limb: Secondary | ICD-10-CM | POA: Diagnosis not present

## 2017-05-07 DIAGNOSIS — M25552 Pain in left hip: Secondary | ICD-10-CM | POA: Diagnosis not present

## 2017-05-07 DIAGNOSIS — M25551 Pain in right hip: Secondary | ICD-10-CM | POA: Diagnosis not present

## 2017-05-07 MED ORDER — PENICILLIN G BENZATHINE 1200000 UNIT/2ML IM SUSP
1.2000 10*6.[IU] | INTRAMUSCULAR | Status: DC
  Administered 2017-05-07 – 2017-08-18 (×4): 1.2 10*6.[IU] via INTRAMUSCULAR

## 2017-05-07 NOTE — Assessment & Plan Note (Signed)
She is very concerned about having a recurrent episode of cellulitis after 4 years without an episode. She is requesting to go back on Bicillin injections every 3 weeks like she took for many years. I am in agreement with that plan. She will also use Lamisil cream and moisturizing cream on her feet. She will follow-up here in 3 months.
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
## 2017-05-13 ENCOUNTER — Ambulatory Visit: Payer: BLUE CROSS/BLUE SHIELD | Admitting: Clinical

## 2017-05-13 DIAGNOSIS — F332 Major depressive disorder, recurrent severe without psychotic features: Secondary | ICD-10-CM | POA: Diagnosis not present

## 2017-05-27 ENCOUNTER — Ambulatory Visit: Payer: BLUE CROSS/BLUE SHIELD | Admitting: Primary Care

## 2017-05-27 ENCOUNTER — Encounter: Payer: Self-pay | Admitting: Primary Care

## 2017-05-27 ENCOUNTER — Ambulatory Visit: Payer: Self-pay | Admitting: Primary Care

## 2017-05-27 ENCOUNTER — Ambulatory Visit: Payer: BLUE CROSS/BLUE SHIELD | Admitting: Clinical

## 2017-05-27 DIAGNOSIS — F319 Bipolar disorder, unspecified: Secondary | ICD-10-CM

## 2017-05-27 DIAGNOSIS — F332 Major depressive disorder, recurrent severe without psychotic features: Secondary | ICD-10-CM | POA: Diagnosis not present

## 2017-05-27 DIAGNOSIS — I890 Lymphedema, not elsewhere classified: Secondary | ICD-10-CM

## 2017-05-27 DIAGNOSIS — I10 Essential (primary) hypertension: Secondary | ICD-10-CM

## 2017-05-27 DIAGNOSIS — Z9884 Bariatric surgery status: Secondary | ICD-10-CM

## 2017-05-27 DIAGNOSIS — L03119 Cellulitis of unspecified part of limb: Secondary | ICD-10-CM

## 2017-05-27 NOTE — Assessment & Plan Note (Signed)
Occurred in March 2016, regained 60 pounds since then. Discussed the importance of a healthy diet and regular exercise in order for weight loss, and to reduce the risk of any potential medical problems.

## 2017-05-27 NOTE — Assessment & Plan Note (Signed)
Discussed the importance of a healthy diet and regular exercise in order for weight loss, and to reduce the risk of any potential medical problems.  

## 2017-05-27 NOTE — Assessment & Plan Note (Signed)
Following with infectious disease and receiving penicillin injections.

## 2017-05-27 NOTE — Assessment & Plan Note (Signed)
Stable in the office today, recommended healthy lifestyle changes. Not currently on medications.

## 2017-05-27 NOTE — Patient Instructions (Signed)
Please schedule a physical with me in 2019. You may also schedule a lab only appointment 3-4 days prior. We will discuss your lab results in detail during your physical.  It was a pleasure to meet you today! Please don't hesitate to call or message me with any questions. Welcome to [facility]!

## 2017-05-27 NOTE — Assessment & Plan Note (Signed)
Stage I secondary to recurrent cellulitis. Strongly encouraged weight loss through healthy diet and regular exercise.

## 2017-05-27 NOTE — Progress Notes (Signed)
Subjective:    Patient ID: Kristie Hendricks, female    DOB: 08/18/1985, 32 y.o.   MRN: [id number]  HPI  Ms. Kristie Hendricks is a 32 year old female who presents today to establish care and discuss the problems mentioned below. Will obtain old records.  1) Recurrent Cellulitis: Present to her bilateral lower extremities and intermittent since high school. Her last episode was in November 2018, prior to that her last episode was three years ago. She does have stage I lymphedema to her lower extremities secondary to recurrent cellulitis. She is following with infectious disease and currently undergoing Bicillin injections.  2) Gastric Bypass: Occurred in March of 2016. No recent follow up since Summer 2018 since moving from [place]. Labs had been stable for the last two years. She has since regained 60 pounds due to stress, unhealthy diet, lack of exercise.  Wt Readings from Last 3 Encounters:  05/27/17 261 lb 12.8 oz (118.8 kg)  05/07/17 256 lb (116.1 kg)  04/08/17 248 lb (112.5 kg)   3) Bipolar Disorder I: Diagnosed in 2011. She finds that she can control bipolar symptoms if she can control her anxiety symptoms. She does experience mood swings. Previously managed on Lithium, Seroquel, Celexa, no current medications. She is [facility]Hospitalized in 2014 and 2015 for Bipolar episodes. Overall feels well managed on current regimen, thinks she may need medication in the future but doesn't wish to start at this time.   4) Hypertension: Previously managed on lisinopril, was able to come off in Summer 2016 due to weight loss from gastric bypass.   BP Readings from Last 3 Encounters:  05/27/17 124/76  05/07/17 125/89  04/08/17 (!) 144/92     Review of Systems  Constitutional: Negative for unexpected weight change.  Eyes: Negative for visual disturbance.  Respiratory: Negative for shortness of breath.   Cardiovascular: Negative for chest pain.    Musculoskeletal:       Chronic SI joint pain  Neurological: Negative for dizziness and headaches.  Psychiatric/Behavioral:       See HPI       Past Medical History:  Diagnosis Date  . Anxiety   . Asthma   . Bipolar disorder (HCC)   . Chickenpox   . Depression   . Frequent headaches   . Hypertension   . Migraines   . Pancreatitis      Social History   Socioeconomic History  . Marital status: Single    Spouse name: Not on file  . Number of children: Not on file  . Years of education: Not on file  . Highest education level: Not on file  Social Needs  . Financial resource strain: Not on file  . Food insecurity - worry: Not on file  . Food insecurity - inability: Not on file  . Transportation needs - medical: Not on file  . Transportation needs - non-medical: Not on file  Occupational History  . Not on file  Tobacco Use  . Smoking status: Never Smoker  . Smokeless tobacco: Never Used  Substance and Sexual Activity  . Alcohol use: Yes    Alcohol/week: 0.6 oz    Types: 1 Glasses of wine per week    Comment: socially   . Drug use: No  . Sexual activity: Not on file  Other Topics Concern  . Not on file  Social History Narrative  . Not on file    Past Surgical History:  Procedure Laterality  Date  . DG GALL BLADDER  2003  . GASTRIC BYPASS  2016    Family History  Problem Relation Age of Onset  . Asthma Mother   . COPD Mother   . Hyperlipidemia Mother   . Hypertension Mother   . Stroke Mother   . Arthritis Father   . Diabetes Father   . Heart disease Father   . Hyperlipidemia Father   . Hypertension Father   . Kidney disease Father   . Asthma Sister   . Depression Sister   . Asthma Brother   . Depression Brother     Allergies  Allergen Reactions  . Clindamycin Anaphylaxis  . Prednisone Other (See Comments) and Nausea And Vomiting    States any steroids pancreatitis pancreatitis  . Dexamethasone Nausea And Vomiting  . Metformin And Related  Other (See Comments)    Severe diarrhea  . Other Nausea And Vomiting    Steroid that starts with a C  - causes Pancreatis   . Morphine Anxiety  . Povidone Iodine Hives and Rash    Current Outpatient Medications on File Prior to Visit  Medication Sig Dispense Refill  . penicillin G procaine-benzathine (BICILLIN-CR) 1200000 UNIT/2ML SUSP inj Inject 1.2 Million Units into the muscle every 21 ( twenty-one) days.     . Acetaminophen 325 MG CAPS Take by mouth.     Current Facility-Administered Medications on File Prior to Visit  Medication Dose Route Frequency Provider Last Rate Last Dose  . penicillin g benzathine (BICILLIN LA) 1200000 UNIT/2ML injection 1.2 Million Units  1.2 Million Units Intramuscular Q21 days Cliffton Asters, MD   1.2 Million Units at 05/07/17 1558    BP 124/76   Pulse 70   Temp 98.1 F (36.7 C) (Oral)   Ht 5\' 6"  (1.676 m)   Wt 261 lb 12.8 oz (118.8 kg)   LMP 05/13/2017   SpO2 98%   BMI 42.26 kg/m    Objective:   Physical Exam  Constitutional: She appears well-nourished.  Neck: Neck supple.  Cardiovascular: Normal rate and regular rhythm.  Pulmonary/Chest: Effort normal and breath sounds normal.  Skin: Skin is warm and dry.  Psychiatric: She has a normal mood and affect.          Assessment & Plan:

## 2017-05-27 NOTE — Assessment & Plan Note (Signed)
Diagnosed in 2011, no current medication use. Continue with regular therapy appointments and stress management.

## 2017-05-28 ENCOUNTER — Ambulatory Visit (INDEPENDENT_AMBULATORY_CARE_PROVIDER_SITE_OTHER): Payer: BLUE CROSS/BLUE SHIELD | Admitting: *Deleted

## 2017-05-28 DIAGNOSIS — L03119 Cellulitis of unspecified part of limb: Secondary | ICD-10-CM | POA: Diagnosis not present

## 2017-05-28 MED ORDER — PENICILLIN G BENZATHINE 1200000 UNIT/2ML IM SUSP
1.2000 10*6.[IU] | Freq: Once | INTRAMUSCULAR | Status: AC
Start: 2017-05-28 — End: 2017-05-28
  Administered 2017-05-28: 1.2 10*6.[IU] via INTRAMUSCULAR

## 2017-06-01 ENCOUNTER — Ambulatory Visit: Payer: Self-pay

## 2017-06-02 DIAGNOSIS — M25562 Pain in left knee: Secondary | ICD-10-CM | POA: Diagnosis not present

## 2017-06-03 ENCOUNTER — Ambulatory Visit: Payer: BLUE CROSS/BLUE SHIELD | Admitting: Clinical

## 2017-06-03 DIAGNOSIS — F332 Major depressive disorder, recurrent severe without psychotic features: Secondary | ICD-10-CM | POA: Diagnosis not present

## 2017-06-09 DIAGNOSIS — M25562 Pain in left knee: Secondary | ICD-10-CM | POA: Diagnosis not present

## 2017-06-10 ENCOUNTER — Ambulatory Visit: Payer: BLUE CROSS/BLUE SHIELD | Admitting: Clinical

## 2017-06-10 DIAGNOSIS — F4322 Adjustment disorder with anxiety: Secondary | ICD-10-CM | POA: Diagnosis not present

## 2017-06-11 DIAGNOSIS — M2242 Chondromalacia patellae, left knee: Secondary | ICD-10-CM | POA: Diagnosis not present

## 2017-06-15 DIAGNOSIS — M2242 Chondromalacia patellae, left knee: Secondary | ICD-10-CM | POA: Diagnosis not present

## 2017-06-15 DIAGNOSIS — M25562 Pain in left knee: Secondary | ICD-10-CM | POA: Diagnosis not present

## 2017-06-17 ENCOUNTER — Ambulatory Visit: Payer: BLUE CROSS/BLUE SHIELD | Admitting: Clinical

## 2017-06-17 DIAGNOSIS — F332 Major depressive disorder, recurrent severe without psychotic features: Secondary | ICD-10-CM

## 2017-06-18 ENCOUNTER — Ambulatory Visit (INDEPENDENT_AMBULATORY_CARE_PROVIDER_SITE_OTHER): Payer: BLUE CROSS/BLUE SHIELD | Admitting: Behavioral Health

## 2017-06-18 DIAGNOSIS — L03119 Cellulitis of unspecified part of limb: Secondary | ICD-10-CM

## 2017-06-18 MED ORDER — PENICILLIN G BENZATHINE 1200000 UNIT/2ML IM SUSP
1.2000 10*6.[IU] | Freq: Once | INTRAMUSCULAR | Status: AC
  Administered 2017-06-18: 1.2 10*6.[IU] via INTRAMUSCULAR

## 2017-06-19 DIAGNOSIS — M2242 Chondromalacia patellae, left knee: Secondary | ICD-10-CM | POA: Diagnosis not present

## 2017-06-19 DIAGNOSIS — M25562 Pain in left knee: Secondary | ICD-10-CM | POA: Diagnosis not present

## 2017-06-23 DIAGNOSIS — M25562 Pain in left knee: Secondary | ICD-10-CM | POA: Diagnosis not present

## 2017-06-23 DIAGNOSIS — M2242 Chondromalacia patellae, left knee: Secondary | ICD-10-CM | POA: Diagnosis not present

## 2017-06-24 ENCOUNTER — Ambulatory Visit: Payer: BLUE CROSS/BLUE SHIELD | Admitting: Clinical

## 2017-06-24 DIAGNOSIS — F332 Major depressive disorder, recurrent severe without psychotic features: Secondary | ICD-10-CM | POA: Diagnosis not present

## 2017-06-25 DIAGNOSIS — M2242 Chondromalacia patellae, left knee: Secondary | ICD-10-CM | POA: Diagnosis not present

## 2017-06-25 DIAGNOSIS — M25562 Pain in left knee: Secondary | ICD-10-CM | POA: Diagnosis not present

## 2017-06-29 ENCOUNTER — Ambulatory Visit: Payer: Self-pay

## 2017-06-30 ENCOUNTER — Telehealth: Payer: Self-pay | Admitting: *Deleted

## 2017-06-30 DIAGNOSIS — M25562 Pain in left knee: Secondary | ICD-10-CM | POA: Diagnosis not present

## 2017-06-30 DIAGNOSIS — M2242 Chondromalacia patellae, left knee: Secondary | ICD-10-CM | POA: Diagnosis not present

## 2017-06-30 NOTE — Telephone Encounter (Signed)
Yes

## 2017-06-30 NOTE — Telephone Encounter (Signed)
Patient would like to move her bicillin injection from 2/14 to earlier that week. Please advise if this is ok to reschedule. Andree CossHowell, Humza Tallerico M, RN

## 2017-07-01 ENCOUNTER — Ambulatory Visit: Payer: BLUE CROSS/BLUE SHIELD | Admitting: Clinical

## 2017-07-01 DIAGNOSIS — F332 Major depressive disorder, recurrent severe without psychotic features: Secondary | ICD-10-CM | POA: Diagnosis not present

## 2017-07-02 DIAGNOSIS — M2242 Chondromalacia patellae, left knee: Secondary | ICD-10-CM | POA: Diagnosis not present

## 2017-07-02 DIAGNOSIS — M25562 Pain in left knee: Secondary | ICD-10-CM | POA: Diagnosis not present

## 2017-07-07 ENCOUNTER — Ambulatory Visit (INDEPENDENT_AMBULATORY_CARE_PROVIDER_SITE_OTHER): Payer: BLUE CROSS/BLUE SHIELD

## 2017-07-07 DIAGNOSIS — M2242 Chondromalacia patellae, left knee: Secondary | ICD-10-CM | POA: Diagnosis not present

## 2017-07-07 DIAGNOSIS — L03119 Cellulitis of unspecified part of limb: Secondary | ICD-10-CM | POA: Diagnosis not present

## 2017-07-07 DIAGNOSIS — M25562 Pain in left knee: Secondary | ICD-10-CM | POA: Diagnosis not present

## 2017-07-08 ENCOUNTER — Ambulatory Visit: Payer: BLUE CROSS/BLUE SHIELD | Admitting: Clinical

## 2017-07-08 DIAGNOSIS — F332 Major depressive disorder, recurrent severe without psychotic features: Secondary | ICD-10-CM | POA: Diagnosis not present

## 2017-07-09 ENCOUNTER — Ambulatory Visit: Payer: Self-pay

## 2017-07-09 DIAGNOSIS — M25562 Pain in left knee: Secondary | ICD-10-CM | POA: Diagnosis not present

## 2017-07-09 DIAGNOSIS — M2242 Chondromalacia patellae, left knee: Secondary | ICD-10-CM | POA: Diagnosis not present

## 2017-07-14 DIAGNOSIS — M25562 Pain in left knee: Secondary | ICD-10-CM | POA: Diagnosis not present

## 2017-07-14 DIAGNOSIS — M2242 Chondromalacia patellae, left knee: Secondary | ICD-10-CM | POA: Diagnosis not present

## 2017-07-15 ENCOUNTER — Ambulatory Visit: Payer: BLUE CROSS/BLUE SHIELD | Admitting: Clinical

## 2017-07-15 DIAGNOSIS — F332 Major depressive disorder, recurrent severe without psychotic features: Secondary | ICD-10-CM | POA: Diagnosis not present

## 2017-07-15 DIAGNOSIS — M25562 Pain in left knee: Secondary | ICD-10-CM | POA: Diagnosis not present

## 2017-07-15 DIAGNOSIS — M2242 Chondromalacia patellae, left knee: Secondary | ICD-10-CM | POA: Diagnosis not present

## 2017-07-21 ENCOUNTER — Ambulatory Visit: Payer: BLUE CROSS/BLUE SHIELD | Admitting: Primary Care

## 2017-07-21 ENCOUNTER — Encounter: Payer: Self-pay | Admitting: Primary Care

## 2017-07-21 VITALS — BP 118/76 | HR 65 | Temp 98.0°F | Ht 66.0 in | Wt 263.8 lb

## 2017-07-21 DIAGNOSIS — N898 Other specified noninflammatory disorders of vagina: Secondary | ICD-10-CM | POA: Diagnosis not present

## 2017-07-21 DIAGNOSIS — M25552 Pain in left hip: Secondary | ICD-10-CM | POA: Diagnosis not present

## 2017-07-21 NOTE — Progress Notes (Signed)
Subjective:    Patient ID: Kristie Hendricks, female    DOB: December 08, 1985, 32 y.o.   MRN: [id number]  HPI  Kristie Hendricks is a 32 year old female with a history of PCOS, bacterial vaginosis, vaginal yeast infections who presents today with a chief complaint of vaginal discharge. Her discharge his white in color. She also notices a fishy odor. Her symptoms began 1.5 months ago, worse about two weeks ago.  She's undergoing penicillin injections for prevention of recurrent cellulitis, last injection was three weeks ago. She's been taking probiotics, monostat, refresh, and apple cider vinegar without improvement.  She denies urinary frequency, dysuria, vaginal itching, hematuria.   Review of Systems  Constitutional: Negative for fever.  Gastrointestinal: Negative for abdominal pain.  Genitourinary: Positive for vaginal discharge. Negative for dysuria, flank pain, frequency and menstrual problem.       Past Medical History:  Diagnosis Date  . Anxiety   . Asthma   . Bipolar disorder (HCC)   . Chickenpox   . Depression   . Frequent headaches   . Hypertension   . Migraines   . Pancreatitis      Social History   Socioeconomic History  . Marital status: Single    Spouse name: Not on file  . Number of children: Not on file  . Years of education: Not on file  . Highest education level: Not on file  Social Needs  . Financial resource strain: Not on file  . Food insecurity - worry: Not on file  . Food insecurity - inability: Not on file  . Transportation needs - medical: Not on file  . Transportation needs - non-medical: Not on file  Occupational History  . Not on file  Tobacco Use  . Smoking status: Never Smoker  . Smokeless tobacco: Never Used  Substance and Sexual Activity  . Alcohol use: Yes    Alcohol/week: 0.6 oz    Types: 1 Glasses of wine per week    Comment: socially   . Drug use: No  . Sexual activity: Not on file  Other Topics Concern  . Not on file  Social History  Narrative  . Not on file    Past Surgical History:  Procedure Laterality Date  . DG GALL BLADDER  2003  . GASTRIC BYPASS  2016    Family History  Problem Relation Age of Onset  . Asthma Mother   . COPD Mother   . Hyperlipidemia Mother   . Hypertension Mother   . Stroke Mother   . Arthritis Father   . Diabetes Father   . Heart disease Father   . Hyperlipidemia Father   . Hypertension Father   . Kidney disease Father   . Asthma Sister   . Depression Sister   . Asthma Brother   . Depression Brother     Allergies  Allergen Reactions  . Clindamycin Anaphylaxis  . Prednisone Other (See Comments) and Nausea And Vomiting    States any steroids pancreatitis pancreatitis  . Dexamethasone Nausea And Vomiting  . Metformin And Related Other (See Comments)    Severe diarrhea  . Other Nausea And Vomiting    Steroid that starts with a C  - causes Pancreatis   . Morphine Anxiety  . Povidone Iodine Hives and Rash    Current Outpatient Medications on File Prior to Visit  Medication Sig Dispense Refill  . Acetaminophen 325 MG CAPS Take by mouth.    . penicillin G procaine-benzathine (BICILLIN-CR) 1200000 UNIT/2ML SUSP  inj Inject 1.2 Million Units into the muscle every 21 ( twenty-one) days.      Current Facility-Administered Medications on File Prior to Visit  Medication Dose Route Frequency Provider Last Rate Last Dose  . penicillin g benzathine (BICILLIN LA) 1200000 UNIT/2ML injection 1.2 Million Units  1.2 Million Units Intramuscular Q21 days Cliffton Astersampbell, John, MD   1.2 Million Units at 07/07/17 1623    BP 118/76   Pulse 65   Temp 98 F (36.7 C) (Oral)   Ht 5\' 6"  (1.676 m)   Wt 263 lb 12.8 oz (119.7 kg)   SpO2 99%   BMI 42.58 kg/m    Objective:   Physical Exam  Constitutional: She appears well-nourished.  Neck: Neck supple.  Genitourinary: There is no tenderness or lesion on the right labia. There is no tenderness or lesion on the left labia. Cervix exhibits  discharge. Cervix exhibits no motion tenderness. No erythema in the vagina. Vaginal discharge found.  Genitourinary Comments: Small amount of thick, whitish discharge. Mild odor.  Skin: Skin is warm and dry.          Assessment & Plan:  Vaginal Discharge:  Present for the past 6 weeks, worse over last 10 days. Getting frequent injections of PCN for prevention of recurrent cellulitis.  Exam today suspicious for at least vaginal yeast infection, could very well be BV. Wet prep sent off for testing. Await results.  Doreene NestKatherine K Marena Witts, NP

## 2017-07-21 NOTE — Patient Instructions (Signed)
I'll be in touch once I receive your results.  Please schedule a physical/presurigcal clearance with me anytime at your convenience. You may also schedule a lab only appointment 3-4 days prior. We will discuss your lab results in detail during your physical.  It was a pleasure to see you today!

## 2017-07-22 ENCOUNTER — Ambulatory Visit: Payer: BLUE CROSS/BLUE SHIELD | Admitting: Clinical

## 2017-07-22 ENCOUNTER — Other Ambulatory Visit: Payer: Self-pay | Admitting: Primary Care

## 2017-07-22 DIAGNOSIS — N760 Acute vaginitis: Principal | ICD-10-CM

## 2017-07-22 DIAGNOSIS — F332 Major depressive disorder, recurrent severe without psychotic features: Secondary | ICD-10-CM | POA: Diagnosis not present

## 2017-07-22 DIAGNOSIS — B9689 Other specified bacterial agents as the cause of diseases classified elsewhere: Secondary | ICD-10-CM

## 2017-07-22 DIAGNOSIS — B373 Candidiasis of vulva and vagina: Secondary | ICD-10-CM

## 2017-07-22 DIAGNOSIS — B3731 Acute candidiasis of vulva and vagina: Secondary | ICD-10-CM

## 2017-07-22 LAB — WET PREP BY MOLECULAR PROBE
Candida species: DETECTED — AB
MICRO NUMBER: 90250346
SPECIMEN QUALITY:: ADEQUATE
TRICHOMONAS VAG: NOT DETECTED

## 2017-07-22 MED ORDER — FLUCONAZOLE 150 MG PO TABS: 150.0000 mg | ORAL_TABLET | Freq: Once | ORAL | 0 refills | Status: AC

## 2017-07-22 MED ORDER — METRONIDAZOLE 500 MG PO TABS: 500.0000 mg | ORAL_TABLET | Freq: Two times a day (BID) | ORAL | 0 refills | Status: DC

## 2017-07-28 ENCOUNTER — Ambulatory Visit: Payer: BLUE CROSS/BLUE SHIELD | Admitting: Internal Medicine

## 2017-07-28 ENCOUNTER — Encounter: Payer: Self-pay | Admitting: Internal Medicine

## 2017-07-28 DIAGNOSIS — L03119 Cellulitis of unspecified part of limb: Secondary | ICD-10-CM
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
## 2017-07-28 NOTE — Assessment & Plan Note (Signed)
She will stay on penicillin injections every 3 weeks for now but is willing to consider other options after she has recovered from surgery.

## 2017-07-29 ENCOUNTER — Ambulatory Visit: Payer: BLUE CROSS/BLUE SHIELD | Admitting: Clinical

## 2017-07-29 DIAGNOSIS — F332 Major depressive disorder, recurrent severe without psychotic features: Secondary | ICD-10-CM

## 2017-08-05 ENCOUNTER — Ambulatory Visit: Payer: Self-pay | Admitting: Clinical

## 2017-08-12 ENCOUNTER — Ambulatory Visit: Payer: BLUE CROSS/BLUE SHIELD | Admitting: Clinical

## 2017-08-12 DIAGNOSIS — F332 Major depressive disorder, recurrent severe without psychotic features: Secondary | ICD-10-CM

## 2017-08-18 ENCOUNTER — Ambulatory Visit (INDEPENDENT_AMBULATORY_CARE_PROVIDER_SITE_OTHER): Payer: BLUE CROSS/BLUE SHIELD | Admitting: Behavioral Health

## 2017-08-18 DIAGNOSIS — L03119 Cellulitis of unspecified part of limb: Secondary | ICD-10-CM | POA: Diagnosis not present

## 2017-08-18 NOTE — Progress Notes (Signed)
Patient tolerated injection to right upper outer quadrant well. Angeline SlimAshley Hill RN

## 2017-08-19 ENCOUNTER — Ambulatory Visit: Payer: BLUE CROSS/BLUE SHIELD | Admitting: Clinical

## 2017-08-19 DIAGNOSIS — F332 Major depressive disorder, recurrent severe without psychotic features: Secondary | ICD-10-CM

## 2017-08-25 ENCOUNTER — Ambulatory Visit: Payer: BLUE CROSS/BLUE SHIELD | Admitting: Clinical

## 2017-08-25 DIAGNOSIS — F332 Major depressive disorder, recurrent severe without psychotic features: Secondary | ICD-10-CM | POA: Diagnosis not present

## 2017-08-26 ENCOUNTER — Ambulatory Visit: Payer: Self-pay | Admitting: Clinical

## 2017-08-26 DIAGNOSIS — M6751 Plica syndrome, right knee: Secondary | ICD-10-CM | POA: Diagnosis not present

## 2017-08-26 DIAGNOSIS — M94262 Chondromalacia, left knee: Secondary | ICD-10-CM | POA: Diagnosis not present

## 2017-08-26 DIAGNOSIS — M62462 Contracture of muscle, left lower leg: Secondary | ICD-10-CM | POA: Diagnosis not present

## 2017-08-26 DIAGNOSIS — G8918 Other acute postprocedural pain: Secondary | ICD-10-CM | POA: Diagnosis not present

## 2017-08-26 DIAGNOSIS — M6752 Plica syndrome, left knee: Secondary | ICD-10-CM | POA: Diagnosis not present

## 2017-08-26 HISTORY — PX: KNEE ARTHROSCOPY: SUR90

## 2017-09-01 DIAGNOSIS — M25462 Effusion, left knee: Secondary | ICD-10-CM | POA: Diagnosis not present

## 2017-09-02 ENCOUNTER — Ambulatory Visit: Payer: BLUE CROSS/BLUE SHIELD | Admitting: Clinical

## 2017-09-02 DIAGNOSIS — F332 Major depressive disorder, recurrent severe without psychotic features: Secondary | ICD-10-CM | POA: Diagnosis not present

## 2017-09-03 DIAGNOSIS — M2242 Chondromalacia patellae, left knee: Secondary | ICD-10-CM | POA: Diagnosis not present

## 2017-09-03 DIAGNOSIS — R262 Difficulty in walking, not elsewhere classified: Secondary | ICD-10-CM | POA: Diagnosis not present

## 2017-09-03 DIAGNOSIS — M25562 Pain in left knee: Secondary | ICD-10-CM | POA: Diagnosis not present

## 2017-09-07 ENCOUNTER — Telehealth: Payer: Self-pay

## 2017-09-07 ENCOUNTER — Ambulatory Visit: Payer: BLUE CROSS/BLUE SHIELD | Admitting: Family Medicine

## 2017-09-07 ENCOUNTER — Encounter: Payer: Self-pay | Admitting: Family Medicine

## 2017-09-07 VITALS — BP 124/76 | HR 88 | Temp 97.9°F | Wt 267.0 lb

## 2017-09-07 DIAGNOSIS — N760 Acute vaginitis: Secondary | ICD-10-CM | POA: Diagnosis not present

## 2017-09-07 DIAGNOSIS — B373 Candidiasis of vulva and vagina: Secondary | ICD-10-CM | POA: Diagnosis not present

## 2017-09-07 DIAGNOSIS — M2242 Chondromalacia patellae, left knee: Secondary | ICD-10-CM | POA: Diagnosis not present

## 2017-09-07 DIAGNOSIS — B3731 Acute candidiasis of vulva and vagina: Secondary | ICD-10-CM

## 2017-09-07 DIAGNOSIS — N898 Other specified noninflammatory disorders of vagina: Secondary | ICD-10-CM

## 2017-09-07 DIAGNOSIS — M25562 Pain in left knee: Secondary | ICD-10-CM | POA: Diagnosis not present

## 2017-09-07 DIAGNOSIS — R300 Dysuria: Secondary | ICD-10-CM

## 2017-09-07 DIAGNOSIS — B9689 Other specified bacterial agents as the cause of diseases classified elsewhere: Secondary | ICD-10-CM

## 2017-09-07 DIAGNOSIS — R262 Difficulty in walking, not elsewhere classified: Secondary | ICD-10-CM | POA: Diagnosis not present

## 2017-09-07 LAB — POC URINALSYSI DIPSTICK (AUTOMATED)
BILIRUBIN UA: NEGATIVE
Blood, UA: NEGATIVE
Glucose, UA: NEGATIVE
Ketones, UA: NEGATIVE
LEUKOCYTES UA: NEGATIVE
NITRITE UA: NEGATIVE
PH UA: 6 (ref 5.0–8.0)
Protein, UA: NEGATIVE
Spec Grav, UA: 1.025 (ref 1.010–1.025)
UROBILINOGEN UA: 0.2 U/dL

## 2017-09-07 NOTE — Telephone Encounter (Signed)
Copied from CRM [id number]. Topic: Quick Communication - Patient Running Late >> Sep 07, 2017  3:58 PM Windy KalataMichael, Taylor L, NT wrote: Patient called and is running 8 minutes late.  Patient is aware of the 10 minute late policy and may be asked to reschedule  Route to department's PEC pool.

## 2017-09-07 NOTE — Telephone Encounter (Signed)
Noted patient seen in office

## 2017-09-07 NOTE — Progress Notes (Signed)
Subjective:    Patient ID: Kristie Hendricks, female    DOB: 08/29/1985, 32 y.o.   MRN: [id number]  HPI This is a 32 yo female who presents today with several days of increased urinary frequency and dysuria, dysuria at end of stream, not beginning.  Poor water intake for last couple of weeks while recovering from knee surgery. Nocturia x 3-4. No vaginal irritation, some white discharge that is increased today. Generally felt poor last week, some low grade fever and back ache. Has had tylenol little relief.   Currently on chronic antibiotic therapy for recurrent cellulitis. Had episode of BV/ vaginal yeast 2/19, symptoms cleared with treatment.  Patient reports that she frequently gets vaginitis.  Past Medical History:  Diagnosis Date  . Anxiety   . Asthma   . Bipolar disorder (HCC)   . Chickenpox   . Depression   . Frequent headaches   . Hypertension   . Migraines   . Pancreatitis    Past Surgical History:  Procedure Laterality Date  . DG GALL BLADDER  2003  . GASTRIC BYPASS  2016   Family History  Problem Relation Age of Onset  . Asthma Mother   . COPD Mother   . Hyperlipidemia Mother   . Hypertension Mother   . Stroke Mother   . Arthritis Father   . Diabetes Father   . Heart disease Father   . Hyperlipidemia Father   . Hypertension Father   . Kidney disease Father   . Asthma Sister   . Depression Sister   . Asthma Brother   . Depression Brother    Social History   Tobacco Use  . Smoking status: Never Smoker  . Smokeless tobacco: Never Used  Substance Use Topics  . Alcohol use: Yes    Alcohol/week: 0.6 oz    Types: 1 Glasses of wine per week    Comment: socially   . Drug use: No      Review of Systems    Per HPI Objective:   Physical Exam  Constitutional: She is oriented to person, place, and time. She appears well-developed. No distress.  HENT:  Head: Normocephalic and atraumatic.  Eyes: Conjunctivae are normal.  Neck: Normal range of motion. Neck  supple.  Cardiovascular: Normal rate.  Pulmonary/Chest: Effort normal.  Neurological: She is alert and oriented to person, place, and time.  Skin: She is not diaphoretic.  Psychiatric: She has a normal mood and affect. Her behavior is normal. Judgment and thought content normal.  Vitals reviewed.     BP 124/76 (BP Location: Right Arm, Patient Position: Sitting, Cuff Size: Normal)   Pulse 88   Temp 97.9 F (36.6 C) (Oral)   Wt 267 lb (121.1 kg)   SpO2 98%   BMI 41.82 kg/m  Wt Readings from Last 3 Encounters:  09/07/17 267 lb (121.1 kg)  07/28/17 268 lb (121.6 kg)  07/21/17 263 lb 12.8 oz (119.7 kg)   Results for orders placed or performed in visit on 09/07/17  POCT Urinalysis Dipstick (Automated)  Result Value Ref Range   Color, UA yellow    Clarity, UA clear    Glucose, UA neg    Bilirubin, UA neg    Ketones, UA neg    Spec Grav, UA 1.025 1.010 - 1.025   Blood, UA neg    pH, UA 6.0 5.0 - 8.0   Protein, UA neg    Urobilinogen, UA 0.2 0.2 or 1.0 E.U./dL   Nitrite, UA neg  Leukocytes, UA Negative Negative       Assessment & Plan:  1. Dysuria - urine very concentrated today, encouraged her to increase fluids - POCT Urinalysis Dipstick (Automated) - Urine Culture  2. Vaginal discharge -Self collected wet prep - WET PREP BY MOLECULAR PROBE  Olean Ree, FNP-BC  [facility], [facility]  09/08/2017 1:48 PM

## 2017-09-07 NOTE — Patient Instructions (Signed)
Good to see you today, increase fluids until urine is light yellow  Can take AZO, acetaminophen (Tylenol) as needed  I will notify you of culture results and wet prep results

## 2017-09-08 ENCOUNTER — Telehealth: Payer: Self-pay

## 2017-09-08 ENCOUNTER — Encounter: Payer: Self-pay | Admitting: Family Medicine

## 2017-09-08 ENCOUNTER — Ambulatory Visit: Payer: BLUE CROSS/BLUE SHIELD

## 2017-09-08 DIAGNOSIS — Z9889 Other specified postprocedural states: Secondary | ICD-10-CM | POA: Diagnosis not present

## 2017-09-08 DIAGNOSIS — L03119 Cellulitis of unspecified part of limb: Secondary | ICD-10-CM

## 2017-09-08 LAB — WET PREP BY MOLECULAR PROBE
CANDIDA SPECIES: DETECTED — AB
MICRO NUMBER:: 90460624
SPECIMEN QUALITY:: ADEQUATE
Trichomonas vaginosis: NOT DETECTED

## 2017-09-08 LAB — URINE CULTURE
MICRO NUMBER: 90460625
SPECIMEN QUALITY: ADEQUATE

## 2017-09-08 MED ORDER — METRONIDAZOLE 500 MG PO TABS: 500.0000 mg | ORAL_TABLET | Freq: Two times a day (BID) | ORAL | 0 refills | Status: DC

## 2017-09-08 MED ORDER — FLUCONAZOLE 150 MG PO TABS: ORAL_TABLET | ORAL | 0 refills | Status: DC

## 2017-09-08 NOTE — Telephone Encounter (Signed)
I spoke with pt; pt was advised as instructed from result note;pt voiced understanding. I advised pt that D GessnerLeone Payor noted on result note 09/08/17 at 8:46 AM and [place] CMA was out of office today. I apologized and pt was understanding. pt request cb when urine culture is complete.

## 2017-09-08 NOTE — Telephone Encounter (Signed)
Copied from CRM [phone]. Topic: General - Other >> Sep 08, 2017 11:39 AM Gerrianne ScalePayne, Angela L wrote: Reason for CRM: patient upset because she had gotten a message stating that she has medicines at the pharmacy and that know one called her about labs or anything to let her know that she was getting prescriptions she would like a call back now

## 2017-09-09 ENCOUNTER — Ambulatory Visit: Payer: BLUE CROSS/BLUE SHIELD | Admitting: Clinical

## 2017-09-09 DIAGNOSIS — F332 Major depressive disorder, recurrent severe without psychotic features: Secondary | ICD-10-CM

## 2017-09-09 NOTE — Telephone Encounter (Signed)
Called and spoke with patient informing her that her urine culture was neg. Understanding verbalized nothing further needed at this time.

## 2017-09-15 DIAGNOSIS — R262 Difficulty in walking, not elsewhere classified: Secondary | ICD-10-CM | POA: Diagnosis not present

## 2017-09-15 DIAGNOSIS — M25562 Pain in left knee: Secondary | ICD-10-CM | POA: Diagnosis not present

## 2017-09-15 DIAGNOSIS — M2242 Chondromalacia patellae, left knee: Secondary | ICD-10-CM | POA: Diagnosis not present

## 2017-09-16 ENCOUNTER — Ambulatory Visit: Payer: Self-pay | Admitting: Clinical

## 2017-09-17 DIAGNOSIS — M2242 Chondromalacia patellae, left knee: Secondary | ICD-10-CM | POA: Diagnosis not present

## 2017-09-17 DIAGNOSIS — M25562 Pain in left knee: Secondary | ICD-10-CM | POA: Diagnosis not present

## 2017-09-17 DIAGNOSIS — R262 Difficulty in walking, not elsewhere classified: Secondary | ICD-10-CM | POA: Diagnosis not present

## 2017-09-23 ENCOUNTER — Ambulatory Visit: Payer: BLUE CROSS/BLUE SHIELD | Admitting: Clinical

## 2017-09-23 DIAGNOSIS — F332 Major depressive disorder, recurrent severe without psychotic features: Secondary | ICD-10-CM | POA: Diagnosis not present

## 2017-09-24 DIAGNOSIS — M2242 Chondromalacia patellae, left knee: Secondary | ICD-10-CM | POA: Diagnosis not present

## 2017-09-24 DIAGNOSIS — M25562 Pain in left knee: Secondary | ICD-10-CM | POA: Diagnosis not present

## 2017-09-24 DIAGNOSIS — R262 Difficulty in walking, not elsewhere classified: Secondary | ICD-10-CM | POA: Diagnosis not present

## 2017-09-29 ENCOUNTER — Ambulatory Visit (INDEPENDENT_AMBULATORY_CARE_PROVIDER_SITE_OTHER): Payer: BLUE CROSS/BLUE SHIELD

## 2017-09-29 DIAGNOSIS — M25562 Pain in left knee: Secondary | ICD-10-CM | POA: Diagnosis not present

## 2017-09-29 DIAGNOSIS — R262 Difficulty in walking, not elsewhere classified: Secondary | ICD-10-CM | POA: Diagnosis not present

## 2017-09-29 DIAGNOSIS — I890 Lymphedema, not elsewhere classified: Secondary | ICD-10-CM | POA: Diagnosis not present

## 2017-09-29 DIAGNOSIS — M2242 Chondromalacia patellae, left knee: Secondary | ICD-10-CM | POA: Diagnosis not present

## 2017-09-29 MED ORDER — PENICILLIN G BENZATHINE 1200000 UNIT/2ML IM SUSP
1.2000 10*6.[IU] | INTRAMUSCULAR | Status: DC
  Administered 2017-09-29: 1.2 10*6.[IU] via INTRAMUSCULAR

## 2017-09-29 NOTE — Progress Notes (Signed)
Patient her for bicillin injection as ordered by Dr. Cliffton Asters.   Laurell Josephs, RN

## 2017-09-30 ENCOUNTER — Ambulatory Visit: Payer: BLUE CROSS/BLUE SHIELD | Admitting: Clinical

## 2017-09-30 DIAGNOSIS — F332 Major depressive disorder, recurrent severe without psychotic features: Secondary | ICD-10-CM | POA: Diagnosis not present

## 2017-10-01 DIAGNOSIS — R262 Difficulty in walking, not elsewhere classified: Secondary | ICD-10-CM | POA: Diagnosis not present

## 2017-10-01 DIAGNOSIS — M2242 Chondromalacia patellae, left knee: Secondary | ICD-10-CM | POA: Diagnosis not present

## 2017-10-01 DIAGNOSIS — M25562 Pain in left knee: Secondary | ICD-10-CM | POA: Diagnosis not present

## 2017-10-06 ENCOUNTER — Telehealth: Payer: Self-pay | Admitting: *Deleted

## 2017-10-06 DIAGNOSIS — M2242 Chondromalacia patellae, left knee: Secondary | ICD-10-CM | POA: Diagnosis not present

## 2017-10-06 DIAGNOSIS — R262 Difficulty in walking, not elsewhere classified: Secondary | ICD-10-CM | POA: Diagnosis not present

## 2017-10-06 DIAGNOSIS — M25562 Pain in left knee: Secondary | ICD-10-CM | POA: Diagnosis not present

## 2017-10-06 NOTE — Telephone Encounter (Signed)
Given her recurrent symptoms I'm happy to send her to GYN, would she like for me to place a referral to be seen? I'm also very happy to see her in the office for testing in the mean time.

## 2017-10-06 NOTE — Telephone Encounter (Signed)
Per DPR, left detail message of [name]'s comments for patient to call back 

## 2017-10-06 NOTE — Telephone Encounter (Signed)
Copied from CRM [id number]. Topic: General - Other >> Oct 06, 2017  8:10 AM Gerrianne Scale wrote: Reason for CRM: patient calling stating that she still has BV symptoms and would like to know what she could do or maybe go see a OBGYN

## 2017-10-07 ENCOUNTER — Encounter: Payer: Self-pay | Admitting: Family Medicine

## 2017-10-07 ENCOUNTER — Ambulatory Visit (INDEPENDENT_AMBULATORY_CARE_PROVIDER_SITE_OTHER): Payer: BLUE CROSS/BLUE SHIELD | Admitting: Family Medicine

## 2017-10-07 VITALS — BP 141/98 | HR 68 | Ht 67.0 in | Wt 274.0 lb

## 2017-10-07 DIAGNOSIS — Z01419 Encounter for gynecological examination (general) (routine) without abnormal findings: Secondary | ICD-10-CM

## 2017-10-07 DIAGNOSIS — Z1151 Encounter for screening for human papillomavirus (HPV): Secondary | ICD-10-CM | POA: Diagnosis not present

## 2017-10-07 DIAGNOSIS — B373 Candidiasis of vulva and vagina: Secondary | ICD-10-CM

## 2017-10-07 DIAGNOSIS — E282 Polycystic ovarian syndrome: Secondary | ICD-10-CM

## 2017-10-07 DIAGNOSIS — Z113 Encounter for screening for infections with a predominantly sexual mode of transmission: Secondary | ICD-10-CM | POA: Diagnosis not present

## 2017-10-07 DIAGNOSIS — Z124 Encounter for screening for malignant neoplasm of cervix: Secondary | ICD-10-CM | POA: Diagnosis not present

## 2017-10-07 NOTE — Progress Notes (Signed)
   GYNECOLOGY ANNUAL PREVENTATIVE CARE ENCOUNTER NOTE  Subjective:   Kristie Hendricks is a 32 y.o. G0P0000 female here for a routine annual gynecologic exam.  Current complaints: recurrent BV and yeast after PCN injections.   Denies abnormal vaginal bleeding, discharge, pelvic pain, problems with intercourse or other gynecologic concerns.    Gynecologic History Patient's last menstrual period was 09/17/2017 (approximate). Contraception: IUD Last Pap: >3 yrs ago Last mammogram: na  The following portions of the patient's history were reviewed and updated as appropriate: allergies, current medications, past family history, past medical history, past social history, past surgical history and problem list.  Review of Systems Pertinent items are noted in HPI.   Objective:  BP (!) 141/98   Pulse 68   Ht  (1.702 m)   Wt 274 lb (124.3 kg)   LMP 09/17/2017 (Approximate)   BMI 42.91 kg/m  CONSTITUTIONAL: Well-developed, well-nourished female in no acute distress.  HENT:  Normocephalic, atraumatic, External right and left ear normal. Oropharynx is clear and moist EYES:  No scleral icterus.  NECK: Normal range of motion, supple, no masses.  Normal thyroid.  SKIN: Skin is warm and dry. No rash noted. Not diaphoretic. No erythema. No pallor. NEUROLOGIC: Alert and oriented to person, place, and time. Normal reflexes, muscle tone coordination. No cranial nerve deficit noted. PSYCHIATRIC: Normal mood and affect. Normal behavior. Normal judgment and thought content. CARDIOVASCULAR: Normal heart rate noted, regular rhythm. 2+ distal pulses. RESPIRATORY: Effort and breath sounds normal, no problems with respiration noted. BREASTS: Symmetric in size. No masses, skin changes, nipple drainage, or lymphadenopathy. ABDOMEN: Soft,  no distention noted.  No tenderness, rebound or guarding.  PELVIC: Normal appearing external genitalia; normal appearing vaginal mucosa and cervix.  No abnormal discharge noted.   Pap smear obtained.  Normal uterine size, no other palpable masses, no uterine or adnexal tenderness. MUSCULOSKELETAL: Normal range of motion.    Assessment and Plan:  1) Annual gynecologic examination with pap smear:  Will follow up results of pap smear and manage accordingly.  STI screen also ordered today- no risk for HIV, on chronic PCN injection thus RPR not indicated.  Routine preventative health maintenance measures emphasized.  2) Contraception counseling: Has LNG-IUD in place.   Please refer to After Visit Summary for other counseling recommendations.   Return in about 1 year (around 10/08/2018) for Yearly wellness exam.  Federico Flake, MD, MPH, ABFM Attending Physician Faculty Practice- Center for [facility]

## 2017-10-07 NOTE — Progress Notes (Signed)
Recurring yeast infections - taking PCN injections for recurring LE cellulitis.  Therapist at [facility] for bipolar.

## 2017-10-07 NOTE — Telephone Encounter (Signed)
FYI. Patient already schedule herself appt with GYN 10/07/2017

## 2017-10-08 DIAGNOSIS — R262 Difficulty in walking, not elsewhere classified: Secondary | ICD-10-CM | POA: Diagnosis not present

## 2017-10-08 DIAGNOSIS — M25562 Pain in left knee: Secondary | ICD-10-CM | POA: Diagnosis not present

## 2017-10-08 DIAGNOSIS — M2242 Chondromalacia patellae, left knee: Secondary | ICD-10-CM | POA: Diagnosis not present

## 2017-10-08 LAB — TSH: TSH: 1.31 u[IU]/mL (ref 0.450–4.500)

## 2017-10-08 LAB — CERVICOVAGINAL ANCILLARY ONLY
Bacterial vaginitis: NEGATIVE
Candida vaginitis: NEGATIVE
Chlamydia: NEGATIVE
Neisseria Gonorrhea: NEGATIVE
TRICH (WINDOWPATH): NEGATIVE

## 2017-10-08 LAB — HEPATITIS C ANTIBODY: Hep C Virus Ab: <0.1 s/co ratio (ref 0.0–0.9)

## 2017-10-08 LAB — HEMOGLOBIN A1C
Est. average glucose Bld gHb Est-mCnc: 94 mg/dL
Hgb A1c MFr Bld: 4.9 % (ref 4.8–5.6)

## 2017-10-13 LAB — CYTOLOGY - PAP
DIAGNOSIS: UNDETERMINED — AB
HPV 16/18/45 genotyping: NEGATIVE
HPV: DETECTED — AB

## 2017-10-14 ENCOUNTER — Ambulatory Visit (INDEPENDENT_AMBULATORY_CARE_PROVIDER_SITE_OTHER): Payer: BLUE CROSS/BLUE SHIELD | Admitting: Clinical

## 2017-10-14 DIAGNOSIS — F332 Major depressive disorder, recurrent severe without psychotic features: Secondary | ICD-10-CM

## 2017-10-15 DIAGNOSIS — R262 Difficulty in walking, not elsewhere classified: Secondary | ICD-10-CM | POA: Diagnosis not present

## 2017-10-15 DIAGNOSIS — M2242 Chondromalacia patellae, left knee: Secondary | ICD-10-CM | POA: Diagnosis not present

## 2017-10-15 DIAGNOSIS — M25562 Pain in left knee: Secondary | ICD-10-CM | POA: Diagnosis not present

## 2017-10-20 ENCOUNTER — Ambulatory Visit: Payer: Self-pay

## 2017-10-21 ENCOUNTER — Ambulatory Visit (INDEPENDENT_AMBULATORY_CARE_PROVIDER_SITE_OTHER): Payer: BLUE CROSS/BLUE SHIELD | Admitting: *Deleted

## 2017-10-21 ENCOUNTER — Ambulatory Visit (INDEPENDENT_AMBULATORY_CARE_PROVIDER_SITE_OTHER): Payer: BLUE CROSS/BLUE SHIELD | Admitting: Clinical

## 2017-10-21 DIAGNOSIS — L03119 Cellulitis of unspecified part of limb: Secondary | ICD-10-CM

## 2017-10-21 DIAGNOSIS — M2242 Chondromalacia patellae, left knee: Secondary | ICD-10-CM | POA: Diagnosis not present

## 2017-10-21 DIAGNOSIS — M25562 Pain in left knee: Secondary | ICD-10-CM | POA: Diagnosis not present

## 2017-10-21 DIAGNOSIS — F332 Major depressive disorder, recurrent severe without psychotic features: Secondary | ICD-10-CM

## 2017-10-21 DIAGNOSIS — R262 Difficulty in walking, not elsewhere classified: Secondary | ICD-10-CM | POA: Diagnosis not present

## 2017-10-21 MED ORDER — PENICILLIN G BENZATHINE 1200000 UNIT/2ML IM SUSP
1.2000 10*6.[IU] | Freq: Once | INTRAMUSCULAR | Status: AC
  Administered 2017-10-21: 1.2 10*6.[IU] via INTRAMUSCULAR

## 2017-10-23 DIAGNOSIS — R262 Difficulty in walking, not elsewhere classified: Secondary | ICD-10-CM | POA: Diagnosis not present

## 2017-10-23 DIAGNOSIS — M25562 Pain in left knee: Secondary | ICD-10-CM | POA: Diagnosis not present

## 2017-10-23 DIAGNOSIS — M2242 Chondromalacia patellae, left knee: Secondary | ICD-10-CM | POA: Diagnosis not present

## 2017-10-26 DIAGNOSIS — M2242 Chondromalacia patellae, left knee: Secondary | ICD-10-CM | POA: Diagnosis not present

## 2017-10-26 DIAGNOSIS — R262 Difficulty in walking, not elsewhere classified: Secondary | ICD-10-CM | POA: Diagnosis not present

## 2017-10-26 DIAGNOSIS — M25562 Pain in left knee: Secondary | ICD-10-CM | POA: Diagnosis not present

## 2017-10-28 ENCOUNTER — Ambulatory Visit (INDEPENDENT_AMBULATORY_CARE_PROVIDER_SITE_OTHER): Payer: BLUE CROSS/BLUE SHIELD | Admitting: Clinical

## 2017-10-28 DIAGNOSIS — F332 Major depressive disorder, recurrent severe without psychotic features: Secondary | ICD-10-CM | POA: Diagnosis not present

## 2017-11-02 ENCOUNTER — Telehealth: Payer: Self-pay | Admitting: Primary Care

## 2017-11-02 DIAGNOSIS — I10 Essential (primary) hypertension: Secondary | ICD-10-CM

## 2017-11-02 NOTE — Telephone Encounter (Signed)
Please notify patient that I never received her liver enzymes from last week. Can you help get these labs? I will most certainly repeat her liver enzymes for her upcoming CPE.

## 2017-11-02 NOTE — Telephone Encounter (Signed)
Copied from CRM [id number]. Topic: Inquiry >> Nov 02, 2017  9:02 AM Maia Pettiesrtiz, Kristie S wrote: Reason for CRM: pt states that she had labs done thru her job and her liver enzymes were out of range. They states it was faxed successfully to Mayra ReelKate Clark, NP last week. Pt scheduled CPE 12/25/17 and labs prior 12/21/17 - would like additional labs for liver if needed at the same time. Please advise pt of additional testing if needed.

## 2017-11-03 NOTE — Telephone Encounter (Signed)
Spoken and notified patient of Kristie Hendricks's comments. Patient verbalized understanding.  

## 2017-11-03 NOTE — Telephone Encounter (Signed)
Patient will have the clinic at her job to resent the lab results. Gave patient the fax number [phone].

## 2017-11-04 ENCOUNTER — Ambulatory Visit (INDEPENDENT_AMBULATORY_CARE_PROVIDER_SITE_OTHER): Payer: BLUE CROSS/BLUE SHIELD | Admitting: Family Medicine

## 2017-11-04 ENCOUNTER — Ambulatory Visit (INDEPENDENT_AMBULATORY_CARE_PROVIDER_SITE_OTHER): Payer: BLUE CROSS/BLUE SHIELD | Admitting: Clinical

## 2017-11-04 ENCOUNTER — Encounter: Payer: Self-pay | Admitting: Family Medicine

## 2017-11-04 VITALS — BP 127/84 | HR 71 | Wt 276.0 lb

## 2017-11-04 DIAGNOSIS — Z3202 Encounter for pregnancy test, result negative: Secondary | ICD-10-CM

## 2017-11-04 DIAGNOSIS — B373 Candidiasis of vulva and vagina: Secondary | ICD-10-CM

## 2017-11-04 DIAGNOSIS — F332 Major depressive disorder, recurrent severe without psychotic features: Secondary | ICD-10-CM

## 2017-11-04 DIAGNOSIS — R87629 Unspecified abnormal cytological findings in specimens from vagina: Secondary | ICD-10-CM

## 2017-11-04 DIAGNOSIS — B3731 Acute candidiasis of vulva and vagina: Secondary | ICD-10-CM

## 2017-11-04 DIAGNOSIS — N870 Mild cervical dysplasia: Secondary | ICD-10-CM | POA: Diagnosis not present

## 2017-11-04 LAB — POCT URINE PREGNANCY: PREG TEST UR: NEGATIVE

## 2017-11-04 MED ORDER — FLUCONAZOLE 150 MG PO TABS: ORAL_TABLET | ORAL | 0 refills | Status: DC

## 2017-11-04 NOTE — Progress Notes (Signed)
    GYNECOLOGY CLINIC COLPOSCOPY PROCEDURE NOTE  32 y.o. G0P0000 here for colposcopy for ASCUS with POSITIVE high risk HPV pap smear on 10/07/2017 Discussed role for HPV in cervical dysplasia, need for surveillance.  UPT neg  Patient given informed consent, signed copy in the chart, time out was performed.  Placed in lithotomy position. Cervix viewed with speculum and colposcope after application of acetic acid.   Physical Exam  Genitourinary: Cervix exhibits no motion tenderness and no friability. No erythema or bleeding in the vagina. Vaginal discharge found.       Colposcopy adequate? No  acetowhite lesion(s) noted at 5-7 o'clock, dense; corresponding biopsies obtained.  ECC specimen obtained. All specimens were labeled and sent to pathology.   Patient was given post procedure instructions.  Will follow up pathology and manage accordingly; patient will be contacted with results and recommendations.  Routine preventative health maintenance measures emphasized.  Federico FlakeKimberly Niles Freddy Kinne, MD, MPH, ABFM Attending Physician Faculty Practice- Center [facility]

## 2017-11-05 DIAGNOSIS — M5413 Radiculopathy, cervicothoracic region: Secondary | ICD-10-CM | POA: Diagnosis not present

## 2017-11-05 DIAGNOSIS — M9901 Segmental and somatic dysfunction of cervical region: Secondary | ICD-10-CM | POA: Diagnosis not present

## 2017-11-05 DIAGNOSIS — M9903 Segmental and somatic dysfunction of lumbar region: Secondary | ICD-10-CM | POA: Diagnosis not present

## 2017-11-05 DIAGNOSIS — M545 Low back pain: Secondary | ICD-10-CM | POA: Diagnosis not present

## 2017-11-06 ENCOUNTER — Encounter: Payer: Self-pay | Admitting: Family Medicine

## 2017-11-06 DIAGNOSIS — R87619 Unspecified abnormal cytological findings in specimens from cervix uteri: Secondary | ICD-10-CM | POA: Insufficient documentation

## 2017-11-09 DIAGNOSIS — M2242 Chondromalacia patellae, left knee: Secondary | ICD-10-CM | POA: Diagnosis not present

## 2017-11-09 DIAGNOSIS — R262 Difficulty in walking, not elsewhere classified: Secondary | ICD-10-CM | POA: Diagnosis not present

## 2017-11-09 DIAGNOSIS — M9903 Segmental and somatic dysfunction of lumbar region: Secondary | ICD-10-CM | POA: Diagnosis not present

## 2017-11-09 DIAGNOSIS — M25562 Pain in left knee: Secondary | ICD-10-CM | POA: Diagnosis not present

## 2017-11-09 DIAGNOSIS — M5413 Radiculopathy, cervicothoracic region: Secondary | ICD-10-CM | POA: Diagnosis not present

## 2017-11-09 DIAGNOSIS — M545 Low back pain: Secondary | ICD-10-CM | POA: Diagnosis not present

## 2017-11-09 DIAGNOSIS — M9901 Segmental and somatic dysfunction of cervical region: Secondary | ICD-10-CM | POA: Diagnosis not present

## 2017-11-10 DIAGNOSIS — M9901 Segmental and somatic dysfunction of cervical region: Secondary | ICD-10-CM | POA: Diagnosis not present

## 2017-11-10 DIAGNOSIS — M9903 Segmental and somatic dysfunction of lumbar region: Secondary | ICD-10-CM | POA: Diagnosis not present

## 2017-11-10 DIAGNOSIS — M5413 Radiculopathy, cervicothoracic region: Secondary | ICD-10-CM | POA: Diagnosis not present

## 2017-11-10 DIAGNOSIS — M545 Low back pain: Secondary | ICD-10-CM | POA: Diagnosis not present

## 2017-11-11 ENCOUNTER — Ambulatory Visit (INDEPENDENT_AMBULATORY_CARE_PROVIDER_SITE_OTHER): Payer: BLUE CROSS/BLUE SHIELD | Admitting: Clinical

## 2017-11-11 DIAGNOSIS — F332 Major depressive disorder, recurrent severe without psychotic features: Secondary | ICD-10-CM | POA: Diagnosis not present

## 2017-11-11 DIAGNOSIS — M25562 Pain in left knee: Secondary | ICD-10-CM | POA: Diagnosis not present

## 2017-11-11 DIAGNOSIS — M2242 Chondromalacia patellae, left knee: Secondary | ICD-10-CM | POA: Diagnosis not present

## 2017-11-11 DIAGNOSIS — R262 Difficulty in walking, not elsewhere classified: Secondary | ICD-10-CM | POA: Diagnosis not present

## 2017-11-16 DIAGNOSIS — M2242 Chondromalacia patellae, left knee: Secondary | ICD-10-CM | POA: Diagnosis not present

## 2017-11-16 DIAGNOSIS — R262 Difficulty in walking, not elsewhere classified: Secondary | ICD-10-CM | POA: Diagnosis not present

## 2017-11-16 DIAGNOSIS — M25562 Pain in left knee: Secondary | ICD-10-CM | POA: Diagnosis not present

## 2017-11-18 ENCOUNTER — Ambulatory Visit (INDEPENDENT_AMBULATORY_CARE_PROVIDER_SITE_OTHER): Payer: BLUE CROSS/BLUE SHIELD | Admitting: Clinical

## 2017-11-18 DIAGNOSIS — F332 Major depressive disorder, recurrent severe without psychotic features: Secondary | ICD-10-CM | POA: Diagnosis not present

## 2017-11-23 ENCOUNTER — Encounter: Payer: Self-pay | Admitting: Family Medicine

## 2017-11-23 ENCOUNTER — Ambulatory Visit (INDEPENDENT_AMBULATORY_CARE_PROVIDER_SITE_OTHER): Payer: BLUE CROSS/BLUE SHIELD | Admitting: Family Medicine

## 2017-11-23 VITALS — BP 130/80 | HR 69 | Temp 98.3°F | Ht 67.0 in | Wt 277.8 lb

## 2017-11-23 DIAGNOSIS — R21 Rash and other nonspecific skin eruption: Secondary | ICD-10-CM

## 2017-11-23 MED ORDER — VALACYCLOVIR HCL 1 G PO TABS: 1000.0000 mg | ORAL_TABLET | Freq: Three times a day (TID) | ORAL | 0 refills | Status: DC

## 2017-11-23 NOTE — Progress Notes (Signed)
BP 130/80 (BP Location: Left Arm, Patient Position: Sitting, Cuff Size: Large)   Pulse 69   Temp 98.3 F (36.8 C) (Oral)   Ht 5\' 7"  (1.702 m)   Wt 277 lb 12 oz (126 kg)   SpO2 98%   BMI 43.50 kg/m    CC: rash on back Subjective:    Patient ID: Sherian Rein, female    DOB: 1985/10/09, 32 y.o.   MRN: [id number]  HPI: Jema Deegan is a 32 y.o. female presenting on 11/23/2017 for Rash (Painful, itchy rash located on mid back in area of bra. Noticed about 1 wk ago. Thought is initially was a bug bite but pain and itching has worsened. Thinks it may be shingles. Had previous breakout in same area with same sxs. )   Discomfort to R upper back noted 1 wk ago. Painful itchy bumps.  Initially thought this was bug bite, but acutely worse yesterday morning.  H/o shingles to same area in the past.  Bad headache for the past week, feeling fatigued - just started 2nd job. Increased stress recently.   No fevers/chills.  No new lotions, detergents, soaps or shampoos.  Relevant past medical, surgical, family and social history reviewed and updated as indicated. Interim medical history since our last visit reviewed. Allergies and medications reviewed and updated. Outpatient Medications Prior to Visit  Medication Sig Dispense Refill  . Acetaminophen 325 MG CAPS Take by mouth.    . fexofenadine (ALLEGRA) 180 MG tablet Take by mouth.    . fluconazole (DIFLUCAN) 150 MG tablet Take one tablet today, repeat in 48 hours. 2 tablet 0  . penicillin G benzathine-penicillin G procaine (BICILLIN C-R 900/300) 900000-300000 UNIT/2ML SUSP Gets injection every 3 weeks    . metroNIDAZOLE (FLAGYL) 500 MG tablet Take 1 tablet (500 mg total) by mouth 2 (two) times daily. Do not drink alcohol while taking medication or for 24 hours once finished. (Patient not taking: Reported on 10/07/2017) 14 tablet 0   No facility-administered medications prior to visit.      Per HPI unless specifically indicated in ROS section  below Review of Systems     Objective:    BP 130/80 (BP Location: Left Arm, Patient Position: Sitting, Cuff Size: Large)   Pulse 69   Temp 98.3 F (36.8 C) (Oral)   Ht 5\' 7"  (1.702 m)   Wt 277 lb 12 oz (126 kg)   SpO2 98%   BMI 43.50 kg/m   Wt Readings from Last 3 Encounters:  11/23/17 277 lb 12 oz (126 kg)  11/04/17 276 lb (125.2 kg)  10/07/17 274 lb (124.3 kg)    Physical Exam  Constitutional: She appears well-developed and well-nourished. No distress.  Skin: Skin is warm and dry. Rash noted. No erythema.     Few clustered papules R upper back along braline, do not follow dermatomal distribution No vesicles   Nursing note and vitals reviewed.     Assessment & Plan:   Problem List Items Addressed This Visit    Skin rash - Primary    Anticipate bug bites - discussed benadryl use for itch. Pt unable to apply steroid cream as she lives alone. Pt cannot tolerate steroid course.  If spreading rash, or more neuropathic pain, printed valtrex Rx to treat possible shingles.  Pt agrees with plan.           Meds ordered this encounter  Medications  . valACYclovir (VALTREX) 1000 MG tablet    Sig: Take 1 tablet (  1,000 mg total) by mouth 3 (three) times daily.    Dispense:  21 tablet    Refill:  0   No orders of the defined types were placed in this encounter.   Follow up plan: Return if symptoms worsen or fail to improve.  Eustaquio BoydenJavier Ladawn Boullion, MD

## 2017-11-23 NOTE — Assessment & Plan Note (Signed)
Anticipate bug bites - discussed benadryl use for itch. Pt unable to apply steroid cream as she lives alone. Pt cannot tolerate steroid course.  If spreading rash, or more neuropathic pain, printed valtrex Rx to treat possible shingles.  Pt agrees with plan.

## 2017-11-23 NOTE — Patient Instructions (Signed)
Possible bug bites - treat itch with benadryl 25mg  (1/2-1 tablet) at night time.  If spreading (new spots) or worsening nerve pain, fill shingles medicine (valtrex).

## 2017-11-24 DIAGNOSIS — R262 Difficulty in walking, not elsewhere classified: Secondary | ICD-10-CM | POA: Diagnosis not present

## 2017-11-24 DIAGNOSIS — M25562 Pain in left knee: Secondary | ICD-10-CM | POA: Diagnosis not present

## 2017-11-24 DIAGNOSIS — M2242 Chondromalacia patellae, left knee: Secondary | ICD-10-CM | POA: Diagnosis not present

## 2017-11-25 ENCOUNTER — Ambulatory Visit (INDEPENDENT_AMBULATORY_CARE_PROVIDER_SITE_OTHER): Payer: BLUE CROSS/BLUE SHIELD | Admitting: Clinical

## 2017-11-25 DIAGNOSIS — F332 Major depressive disorder, recurrent severe without psychotic features: Secondary | ICD-10-CM

## 2017-12-02 ENCOUNTER — Ambulatory Visit (INDEPENDENT_AMBULATORY_CARE_PROVIDER_SITE_OTHER): Payer: BLUE CROSS/BLUE SHIELD | Admitting: Clinical

## 2017-12-02 DIAGNOSIS — F332 Major depressive disorder, recurrent severe without psychotic features: Secondary | ICD-10-CM

## 2017-12-09 ENCOUNTER — Ambulatory Visit (INDEPENDENT_AMBULATORY_CARE_PROVIDER_SITE_OTHER): Payer: BLUE CROSS/BLUE SHIELD | Admitting: Clinical

## 2017-12-09 DIAGNOSIS — F332 Major depressive disorder, recurrent severe without psychotic features: Secondary | ICD-10-CM | POA: Diagnosis not present

## 2017-12-16 ENCOUNTER — Ambulatory Visit (INDEPENDENT_AMBULATORY_CARE_PROVIDER_SITE_OTHER): Payer: BLUE CROSS/BLUE SHIELD | Admitting: Clinical

## 2017-12-16 DIAGNOSIS — F332 Major depressive disorder, recurrent severe without psychotic features: Secondary | ICD-10-CM

## 2017-12-21 ENCOUNTER — Other Ambulatory Visit (INDEPENDENT_AMBULATORY_CARE_PROVIDER_SITE_OTHER): Payer: BLUE CROSS/BLUE SHIELD

## 2017-12-21 DIAGNOSIS — I10 Essential (primary) hypertension: Secondary | ICD-10-CM

## 2017-12-21 LAB — COMPREHENSIVE METABOLIC PANEL
ALT: 17 U/L (ref 0–35)
AST: 15 U/L (ref 0–37)
Albumin: 4 g/dL (ref 3.5–5.2)
Alkaline Phosphatase: 105 U/L (ref 39–117)
BUN: 11 mg/dL (ref 6–23)
CHLORIDE: 106 meq/L (ref 96–112)
CO2: 27 meq/L (ref 19–32)
CREATININE: 0.66 mg/dL (ref 0.40–1.20)
Calcium: 9.1 mg/dL (ref 8.4–10.5)
GFR: 111.16 mL/min (ref >60.00)
Glucose, Bld: 89 mg/dL (ref 70–99)
Potassium: 3.8 mEq/L (ref 3.5–5.1)
SODIUM: 141 meq/L (ref 135–145)
Total Bilirubin: 1.2 mg/dL (ref 0.2–1.2)
Total Protein: 6.9 g/dL (ref 6.0–8.3)

## 2017-12-21 LAB — LIPID PANEL
CHOL/HDL RATIO: 3
Cholesterol: 137 mg/dL (ref 0–200)
HDL: 46.9 mg/dL (ref >39.00)
LDL CALC: 67 mg/dL (ref 0–99)
NonHDL: 90
Triglycerides: 113 mg/dL (ref 0.0–149.0)
VLDL: 22.6 mg/dL (ref 0.0–40.0)

## 2017-12-23 ENCOUNTER — Ambulatory Visit (INDEPENDENT_AMBULATORY_CARE_PROVIDER_SITE_OTHER): Payer: BLUE CROSS/BLUE SHIELD | Admitting: Clinical

## 2017-12-23 DIAGNOSIS — F4322 Adjustment disorder with anxiety: Secondary | ICD-10-CM

## 2017-12-25 ENCOUNTER — Encounter: Payer: Self-pay | Admitting: Primary Care

## 2017-12-30 ENCOUNTER — Ambulatory Visit (INDEPENDENT_AMBULATORY_CARE_PROVIDER_SITE_OTHER): Payer: BLUE CROSS/BLUE SHIELD | Admitting: Clinical

## 2017-12-30 DIAGNOSIS — F332 Major depressive disorder, recurrent severe without psychotic features: Secondary | ICD-10-CM

## 2018-01-01 ENCOUNTER — Encounter: Payer: BLUE CROSS/BLUE SHIELD | Admitting: Primary Care

## 2018-01-01 DIAGNOSIS — Z0289 Encounter for other administrative examinations: Secondary | ICD-10-CM

## 2018-01-06 ENCOUNTER — Ambulatory Visit: Payer: BLUE CROSS/BLUE SHIELD | Admitting: Clinical

## 2018-01-13 ENCOUNTER — Ambulatory Visit (INDEPENDENT_AMBULATORY_CARE_PROVIDER_SITE_OTHER): Payer: BLUE CROSS/BLUE SHIELD | Admitting: Clinical

## 2018-01-13 DIAGNOSIS — F332 Major depressive disorder, recurrent severe without psychotic features: Secondary | ICD-10-CM | POA: Diagnosis not present

## 2018-01-20 ENCOUNTER — Ambulatory Visit (INDEPENDENT_AMBULATORY_CARE_PROVIDER_SITE_OTHER): Payer: BLUE CROSS/BLUE SHIELD | Admitting: Clinical

## 2018-01-20 DIAGNOSIS — F332 Major depressive disorder, recurrent severe without psychotic features: Secondary | ICD-10-CM | POA: Diagnosis not present

## 2018-01-21 ENCOUNTER — Ambulatory Visit: Payer: BLUE CROSS/BLUE SHIELD | Admitting: Internal Medicine

## 2018-01-22 ENCOUNTER — Ambulatory Visit (INDEPENDENT_AMBULATORY_CARE_PROVIDER_SITE_OTHER): Payer: BLUE CROSS/BLUE SHIELD | Admitting: Family Medicine

## 2018-01-22 ENCOUNTER — Encounter: Payer: Self-pay | Admitting: Family Medicine

## 2018-01-22 DIAGNOSIS — L749 Eccrine sweat disorder, unspecified: Secondary | ICD-10-CM | POA: Diagnosis not present

## 2018-01-22 DIAGNOSIS — R251 Tremor, unspecified: Secondary | ICD-10-CM

## 2018-01-22 LAB — POCT CBG (FASTING - GLUCOSE)-MANUAL ENTRY: Glucose Fasting, POC: 102 mg/dL — AB (ref 70–99)

## 2018-01-22 NOTE — Patient Instructions (Signed)
Good to see you today  If you are interested in checking your blood sugar, you can get an over the counter glucose monitor   Keep a log of your episodes of bring if if additional episodes   Hypoglycemia Hypoglycemia occurs when the level of sugar (glucose) in the blood is too low. Glucose is a type of sugar that provides the body's main source of energy. Certain hormones (insulin and glucagon) control the level of glucose in the blood. Insulin lowers blood glucose, and glucagon increases blood glucose. Hypoglycemia can result from having too much insulin in the bloodstream, or from not eating enough food that contains glucose. Hypoglycemia can happen in people who do or do not have diabetes. It can develop quickly, and it can be a medical emergency. What are the causes? Hypoglycemia occurs most often in people who have diabetes. If you have diabetes, hypoglycemia may be caused by:  Diabetes medicine.  Not eating enough, or not eating often enough.  Increased physical activity.  Drinking alcohol, especially when you have not eaten recently.  If you do not have diabetes, hypoglycemia may be caused by:  A tumor in the pancreas. The pancreas is the organ that makes insulin.  Not eating enough, or not eating for long periods at a time (fasting).  Severe infection or illness that affects the liver, heart, or kidneys.  Certain medicines.  You may also have reactive hypoglycemia. This condition causes hypoglycemia within 4 hours of eating a meal. This may occur after having stomach surgery. Sometimes, the cause of reactive hypoglycemia is not known. What increases the risk? Hypoglycemia is more likely to develop in:  People who have diabetes and take medicines to lower blood glucose.  People who abuse alcohol.  People who have a severe illness.  What are the signs or symptoms? Hypoglycemia may not cause any symptoms. If you have symptoms, they may  include:  Hunger.  Anxiety.  Sweating and feeling clammy.  Confusion.  Dizziness or feeling light-headed.  Sleepiness.  Nausea.  Increased heart rate.  Headache.  Blurry vision.  Seizure.  Nightmares.  Tingling or numbness around the mouth, lips, or tongue.  A change in speech.  Decreased ability to concentrate.  A change in coordination.  Restless sleep.  Tremors or shakes.  Fainting.  Irritability.  How is this diagnosed? Hypoglycemia is diagnosed with a blood test to measure your blood glucose level. This blood test is done while you are having symptoms. Your health care provider may also do a physical exam and review your medical history. If you do not have diabetes, other tests may be done to find the cause of your hypoglycemia. How is this treated? This condition can often be treated by immediately eating or drinking something that contains glucose, such as:  3-4 sugar tablets (glucose pills).  Glucose gel, 15-gram tube.  Fruit juice, 4 oz (120 mL).  Regular soda (not diet soda), 4 oz (120 mL).  Low-fat milk, 4 oz (120 mL).  Several pieces of hard candy.  Sugar or honey, 1 Tbsp.  Treating Hypoglycemia If You Have Diabetes  If you are alert and able to swallow safely, follow the 15:15 rule:  Take 15 grams of a rapid-acting carbohydrate. Rapid-acting options include: ? 1 tube of glucose gel. ? 3 glucose pills. ? 6-8 pieces of hard candy. ? 4 oz (120 mL) of fruit juice. ? 4 oz (120 ml) of regular (not diet) soda.  Check your blood glucose 15 minutes after you  take the carbohydrate.  If the repeat blood glucose level is still at or below 70 mg/dL (3.9 mmol/L), take 15 grams of a carbohydrate again.  If your blood glucose level does not increase above 70 mg/dL (3.9 mmol/L) after 3 tries, seek emergency medical care.  After your blood glucose level returns to normal, eat a meal or a snack within 1 hour.  Treating Severe  Hypoglycemia Severe hypoglycemia is when your blood glucose level is at or below 54 mg/dL (3 mmol/L). Severe hypoglycemia is an emergency. Do not wait to see if the symptoms will go away. Get medical help right away. Call your local emergency services (911 in the [place] Do not drive yourself to the hospital. If you have severe hypoglycemia and you cannot eat or drink, you may need an injection of glucagon. A family member or close friend should learn how to check your blood glucose and how to give you a glucagon injection. Ask your health care provider if you need to have an emergency glucagon injection kit available. Severe hypoglycemia may need to be treated in a hospital. The treatment may include getting glucose through an IV tube. You may also need treatment for the cause of your hypoglycemia. Follow these instructions at home: General instructions  Avoid any diets that cause you to not eat enough food. Talk with your health care provider before you start any new diet.  Take over-the-counter and prescription medicines only as told by your health care provider.  Limit alcohol intake to no more than 1 drink per day for nonpregnant women and 2 drinks per day for men. One drink equals 12 oz of beer, 5 oz of wine, or 1 oz of hard liquor.  Keep all follow-up visits as told by your health care provider. This is important. If You Have Diabetes:   Make sure you know the symptoms of hypoglycemia.  Always have a rapid-acting carbohydrate snack with you to treat low blood sugar.  Follow your diabetes management plan, as told by your health care provider. Make sure you: ? Take your medicines as directed. ? Follow your exercise plan. ? Follow your meal plan. Eat on time, and do not skip meals. ? Check your blood glucose as often as directed. Make sure to check your blood glucose before and after exercise. If you exercise longer or in a different way than usual, check your blood glucose more  often. ? Follow your sick day plan whenever you cannot eat or drink normally. Make this plan in advance with your health care provider.  Share your diabetes management plan with people in your workplace, school, and household.  Check your urine for ketones when you are ill and as told by your health care provider.  Carry a medical alert card or wear medical alert jewelry. If You Have Reactive Hypoglycemia or Low Blood Sugar From Other Causes:  Monitor your blood glucose as told by your health care provider.  Follow instructions from your health care provider about eating or drinking restrictions. Contact a health care provider if:  You have problems keeping your blood glucose in your target range.  You have frequent episodes of hypoglycemia. Get help right away if:  You continue to have hypoglycemia symptoms after eating or drinking something containing glucose.  Your blood glucose is at or below 54 mg/dL (3 mmol/L).  You have a seizure.  You faint. These symptoms may represent a serious problem that is an emergency. Do not wait to see if the  symptoms will go away. Get medical help right away. Call your local emergency services (911 in the [place] Do not drive yourself to the hospital. This information is not intended to replace advice given to you by your health care provider. Make sure you discuss any questions you have with your health care provider. Document Released: 05/12/2005 Document Revised: 10/24/2015 Document Reviewed: 06/15/2015 Elsevier Interactive Patient Education  [employer].

## 2018-01-22 NOTE — Progress Notes (Signed)
  [id number]  [id number]                                               u u u  u uuu U uY   [id number]     [id number]yyyyyyyyyyyyyyyyyyyyyyyyyyyyyyyyyyyyyyyyyyyyyyyyyyyyyyyyyyyyyyyyyyyyyyyyyyyyyyyyyyyyyyyyyyyyyyyyyyyyyyyyyyyyyyyyyyyyyyyyyyyyyyyyyyyyyyyyyyyyyyyyyyyyyyyyyyyyyyyyyyyyyyyyyyyyyyyyyyyyyyyyyyyyyyyyyyyyyyyyyyyyyyyyyyyyyyyyyyyyyyyyyyyyyyyyyyyyyyyyyyyyyyyyyyyyyyyyyyyyyyyyyyyyyyyyyyyyyyyyyyyyyyyyyyyyyyyyyyyyyyyyyyyyyyyyyyyyyyyyyyyyyyyyyyyyyyyyyyyyyyyyyyyyyyyyyyyyyyyyyyyyyyyyyyyyyyyyyyyyyyyyyyyyyyyyyyyyyyyyyyyyyyyyyyyyyyyyyyyyyyyyyyyyyyyyyyyyyyyyyyyyyyyyyyyyyyyyyyyyyyyyyyyyyyyyyyyyyyyyyyyyyyyyyyyyyyyyyyyyyyyyyyyyyyyyyyyyyyyyyyyyyyyyyyyyyyyyyyyyyyyyyyyyyyyyyyyyyyyyyyyyyyyyyyyyyyyyyyyyyyyyyyyy[id number]

## 2018-01-22 NOTE — Progress Notes (Signed)
Subjective:    Patient ID: Kristie Hendricks, female    DOB: 03/18/1986, 32 y.o.   MRN: [id number]  HPI This is a 32 yo female who presents today with feeling of blood sugar "bottoming out," recently. Happened 8/18, 8/23, felt shaky, dizzy like she might pass out. Before one episode had had pancakes 1-2 hours prior, for the second episode, had a full meal prior.  Has history of gastric bypass and wonders if she is having reactive hypoglycemia which she has heard occurring on her online support group. Episodes seems to happen about 1.5 hours after eating. Ate cookout after one episode and symptoms resolved. No nighttime symptoms. These episodes caused severe anxiety. 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 Little time for self care. 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   This morning she had food at 1 am (about 8.5 hours ago).   Hgba1C 10/07/17- 4.9.  Past Medical History:  Diagnosis Date  . Anxiety   . Asthma   . Bipolar disorder (HCC)   . Chickenpox   . Depression   . Frequent headaches   . Hypertension   . Migraines   . Pancreatitis    Past Surgical History:  Procedure Laterality Date  . DG GALL BLADDER  2003  . GASTRIC BYPASS  2016  . KNEE ARTHROSCOPY Left 08/26/2017   Family History  Problem Relation Age of Onset  . Asthma Mother   . COPD Mother   . Hyperlipidemia Mother   . Hypertension Mother   . Stroke Mother   . Arthritis Father   . Diabetes Father   . Heart disease Father   . Hyperlipidemia Father   . Hypertension Father   . Kidney disease Father   . Asthma Sister   . Depression Sister   . Asthma Brother   . Depression Brother   . Uterine cancer Maternal Grandmother [id number]   Social History   Tobacco Use  . Smoking status: Never Smoker  . Smokeless tobacco: Never Used  Substance Use Topics  . Alcohol use: Yes    Alcohol/week: 1.0 standard drinks    Types: 1 Glasses  of wine per week    Comment: socially   . Drug use: No      Review of Systems Per HPI    Objective:   Physical Exam  Constitutional: She is oriented to person, place, and time. She appears well-developed and well-nourished. No distress.  HENT:  Head: Normocephalic and atraumatic.  Eyes: Conjunctivae are normal.  Cardiovascular: Normal rate.  Pulmonary/Chest: Effort normal.  Neurological: She is alert and oriented to person, place, and time.  Skin: She is not diaphoretic.  Psychiatric: She has a normal mood and affect. Her behavior is normal. Judgment and thought content normal.  Vitals reviewed.     BP 136/76 (BP Location: Right Arm, Patient Position: Sitting, Cuff Size: Normal)   Pulse 79   Temp 97.8 F (36.6 C) (Oral)   Ht 5\' 7"  (1.702 m)   Wt 282 lb 8 oz (128.1 kg)   SpO2 97%   BMI 44.25 kg/m  Wt Readings from Last 3 Encounters:  01/22/18 282 lb 8 oz (128.1 kg)  11/23/17 277 lb 12 oz (126 kg)  11/04/17 276 lb (125.2 kg)   Results for orders placed or performed in visit on 01/22/18  Glucose (CBG), Fasting  Result Value Ref Range   Glucose  Fasting, POC 102 (A) 70 - 99 mg/dL       Assessment & Plan:  1. Shaking - Glucose (CBG), Fasting - normal fasting glucose and recent hgba1c, could be hypoglycemia, would be difficult to capture - encouraged her to keep a log of episodes and recent intake.  - discussed eating protein and complex carbs with each meal and snack and to avoid long stretches of not eating - RTC precautions reviewed  2. Sweating abnormality - see #1   Olean Ree, FNP-BC   Primary Care at [facility], [facility]  01/24/2018 9:36 AM

## 2018-02-01 ENCOUNTER — Ambulatory Visit: Payer: Self-pay | Admitting: Internal Medicine

## 2018-02-03 ENCOUNTER — Ambulatory Visit (INDEPENDENT_AMBULATORY_CARE_PROVIDER_SITE_OTHER): Payer: BLUE CROSS/BLUE SHIELD | Admitting: Clinical

## 2018-02-03 DIAGNOSIS — F332 Major depressive disorder, recurrent severe without psychotic features: Secondary | ICD-10-CM | POA: Diagnosis not present

## 2018-02-10 ENCOUNTER — Ambulatory Visit (INDEPENDENT_AMBULATORY_CARE_PROVIDER_SITE_OTHER): Payer: BLUE CROSS/BLUE SHIELD | Admitting: Clinical

## 2018-02-10 DIAGNOSIS — F332 Major depressive disorder, recurrent severe without psychotic features: Secondary | ICD-10-CM | POA: Diagnosis not present

## 2018-02-17 ENCOUNTER — Ambulatory Visit (INDEPENDENT_AMBULATORY_CARE_PROVIDER_SITE_OTHER): Payer: BLUE CROSS/BLUE SHIELD | Admitting: Clinical

## 2018-02-17 DIAGNOSIS — F332 Major depressive disorder, recurrent severe without psychotic features: Secondary | ICD-10-CM | POA: Diagnosis not present

## 2018-02-24 ENCOUNTER — Ambulatory Visit (INDEPENDENT_AMBULATORY_CARE_PROVIDER_SITE_OTHER): Payer: BLUE CROSS/BLUE SHIELD | Admitting: Clinical

## 2018-02-24 DIAGNOSIS — F332 Major depressive disorder, recurrent severe without psychotic features: Secondary | ICD-10-CM

## 2018-03-02 DIAGNOSIS — M9902 Segmental and somatic dysfunction of thoracic region: Secondary | ICD-10-CM | POA: Diagnosis not present

## 2018-03-02 DIAGNOSIS — M542 Cervicalgia: Secondary | ICD-10-CM | POA: Diagnosis not present

## 2018-03-02 DIAGNOSIS — M546 Pain in thoracic spine: Secondary | ICD-10-CM | POA: Diagnosis not present

## 2018-03-02 DIAGNOSIS — M9901 Segmental and somatic dysfunction of cervical region: Secondary | ICD-10-CM | POA: Diagnosis not present

## 2018-03-03 ENCOUNTER — Ambulatory Visit (INDEPENDENT_AMBULATORY_CARE_PROVIDER_SITE_OTHER): Payer: BLUE CROSS/BLUE SHIELD | Admitting: Clinical

## 2018-03-03 DIAGNOSIS — F332 Major depressive disorder, recurrent severe without psychotic features: Secondary | ICD-10-CM | POA: Diagnosis not present

## 2018-03-05 DIAGNOSIS — M9901 Segmental and somatic dysfunction of cervical region: Secondary | ICD-10-CM | POA: Diagnosis not present

## 2018-03-05 DIAGNOSIS — M546 Pain in thoracic spine: Secondary | ICD-10-CM | POA: Diagnosis not present

## 2018-03-05 DIAGNOSIS — M9902 Segmental and somatic dysfunction of thoracic region: Secondary | ICD-10-CM | POA: Diagnosis not present

## 2018-03-05 DIAGNOSIS — M542 Cervicalgia: Secondary | ICD-10-CM | POA: Diagnosis not present

## 2018-03-08 ENCOUNTER — Ambulatory Visit (INDEPENDENT_AMBULATORY_CARE_PROVIDER_SITE_OTHER): Payer: BLUE CROSS/BLUE SHIELD | Admitting: Family Medicine

## 2018-03-08 ENCOUNTER — Encounter: Payer: Self-pay | Admitting: Family Medicine

## 2018-03-08 DIAGNOSIS — M9902 Segmental and somatic dysfunction of thoracic region: Secondary | ICD-10-CM | POA: Diagnosis not present

## 2018-03-08 DIAGNOSIS — M9901 Segmental and somatic dysfunction of cervical region: Secondary | ICD-10-CM | POA: Diagnosis not present

## 2018-03-08 DIAGNOSIS — M542 Cervicalgia: Secondary | ICD-10-CM | POA: Diagnosis not present

## 2018-03-08 DIAGNOSIS — M546 Pain in thoracic spine: Secondary | ICD-10-CM | POA: Diagnosis not present

## 2018-03-08 DIAGNOSIS — Z6841 Body Mass Index (BMI) 40.0 and over, adult: Secondary | ICD-10-CM | POA: Diagnosis not present

## 2018-03-08 NOTE — Progress Notes (Signed)
Medical Nutrition Therapy PCP Alma Friendly, PA Infectious Disease Physician Michel Bickers, MD Therapist Laroy Apple, PhD   Assessment:  Primary concerns today: Weight management.  Jodiann is interested in better overall nutrition and in better managing emotional eating.  Also needs some help in streamlining meal planning and prep, as well as preventing hypoglycemia.    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  She had gastric bypass surg in [place] in Mar 2016, and lost 140 lb.  She had been doing Jazzercise up to three times a day, then had to stop exercising d/t a back injury, after which she started to regain weight.  She moved to [place] 18 mo ago, but has met few people outside of work.  She was taking classes and working full-time as a Designer, industrial/product, graduated in April 2019 with a BS in psychology, so had no time for exercise or for food planning.  She re-injured her back in Aug 2018, and her knee in December 2018.  She now works a second job, trying to pay [employer], so time constraints for both exercise and food preparation are problematic.    In August she started to experience reactive hypoglycemia (see office visit 01/22/18), which she had first experienced in 2017.      She has been getting quarterly penicillin as a prevention of cellulitis, but hopes to discontinue this.  Tyesha is not currently taking a probiotic.    Usual eating pattern: 2 meals and 1-2 snacks per day. Frequent foods and beverages: Orgain protein drinks, water, soda; frozen entrees, fast food, fruit.   Avoided foods: milk, ice cream (lactose intol), soy (gives diarrhea), pork, most seafood, and fish.   Usual physical activity: none currently other than part-time job at [employer]. Sleep: Estimates she gets about 4 1/2 hours of sleep per night.  Gets home late from second job, and wakes frequently in the night.     24-hr recall: (Up at 9:30 AM, although awake at 7:30 to a phone call) B (8 AM)-   water Snk ( AM)-    L (12:30 PM)-  Orgain protein drink, water Snk (2 PM)-  20 oz soda  D (5 PM)-  3/4 [employer] beef hot dog, 10 oz soda Snk (7, 9:30)-  1 glass white wine, water, 1 gin&tonic,  Snk (10 PM)-  36 oz Pepsi, water Snk (1 AM)-  1/2 turkey-chs sandwich Typical day? No.  More typical is some small source of protein in AM (str chs/protein drink), lunch of a real meal ~12:30, dinner is usually between 8:45 and 10 PM, yesterday was an abnormally large amt of soda.    Nutritional Diagnosis:  NI-5.8.2 Excessive carbohydrate intake As related to glycemic control.  As evidenced by yesterday's intake of >60 oz of soda.    Intervention:  Nutrition education.  Handouts given during visit include:  After-Visit Summary (AVS)  Meal Planning Form  Demonstrated degree of understanding via:  Teach Back  Barriers to learning/adherence to lifestyle change: Time constraints from working two jobs.   Monitoring/Evaluation:  Dietary intake, exercise, and body weight in 4 week(s).

## 2018-03-08 NOTE — Patient Instructions (Addendum)
1. Eat at least REAL 3 meals and 1-2 snacks per day. Never go more than 4-5 hours while awake without eating. Eat breakfast within the first hour of getting up.  (A real meal includes at least a source of protein, starch, and veg's and/or fruit OR: Would you serve this to a guest in your home,and call it a meal?) 2. Limit starchy foods to TWO portions per meal and ONE per snack. ONE portion of a starchy  food is equal to the following:   - ONE slice of bread (or its equivalent, such as half of a hamburger bun).   - 1/2 cup of a "scoopable" starchy food such as potatoes or rice.   - 15 grams of Total Carbohydrate as shown on food label.   - Every 4 ounces of a sweet drink (including fruit juice) 3. Include at every meal: a protein food, a carb food, and vegetables and/or fruit.   - Obtain twice the volume of veg's as protein or carbohydrate foods for both lunch and dinner.   - Fresh or frozen veg's are best.   - Keep frozen veg's on hand for a quick vegetable serving.     - Canned soup: Always first microwave veg's, then add the soup, and reheat.    - This is our baseline plan, which may need to be fine-tuned, depending on how you respond to eating in this way.  Keep in mind, this is NOT intended to be a rigid food plan.    Carbohydrate includes starch, sugar, and fiber.  Of these, only sugar and starch raise blood glucose.  (Fiber is found in fruits, vegetables [especially skin, seeds, and stalks] and whole grains.)   Starchy (carb) foods: Bread, rice, pasta, potatoes, corn, cereal, grits, crackers, bagels, muffins, all baked goods.  (Fruit, milk, and yogurt also have carbohydrate, but most of these foods will not spike your blood sugar as most starchy foods will.)  A few fruits do cause high blood sugars; use small portions of bananas (limit to 1/2 at a time), grapes, watermelon, oranges, and most tropical fruits.   Protein foods: Meat, fish, poultry, eggs, dairy foods, and beans such as pinto and  kidney beans (beans also provide carbohydrate).   - Use the Meal Planning Form provided today as a basis for shopping, so you keep on hand those ingredients you need for the 7 dinner meals at any given time.    HAALT:  Hungry, Angry, Anxious, Lonely, Tired - all red flags for poor decisions.  For example, when you are thinking Oreos, do you really need some sleep?  AT FOLLOW-UP WE'LL TALK MORE ABOUT EMOTIONAL EATING.

## 2018-03-09 ENCOUNTER — Encounter: Payer: Self-pay | Admitting: Primary Care

## 2018-03-09 ENCOUNTER — Ambulatory Visit (INDEPENDENT_AMBULATORY_CARE_PROVIDER_SITE_OTHER): Payer: BLUE CROSS/BLUE SHIELD | Admitting: Primary Care

## 2018-03-09 VITALS — BP 127/80 | HR 63 | Temp 97.7°F | Ht 67.0 in | Wt 288.8 lb

## 2018-03-09 DIAGNOSIS — E282 Polycystic ovarian syndrome: Secondary | ICD-10-CM

## 2018-03-09 DIAGNOSIS — M545 Low back pain, unspecified: Secondary | ICD-10-CM

## 2018-03-09 DIAGNOSIS — M9901 Segmental and somatic dysfunction of cervical region: Secondary | ICD-10-CM | POA: Diagnosis not present

## 2018-03-09 DIAGNOSIS — I10 Essential (primary) hypertension: Secondary | ICD-10-CM | POA: Diagnosis not present

## 2018-03-09 DIAGNOSIS — Z0000 Encounter for general adult medical examination without abnormal findings: Secondary | ICD-10-CM | POA: Diagnosis not present

## 2018-03-09 DIAGNOSIS — G8929 Other chronic pain: Secondary | ICD-10-CM | POA: Insufficient documentation

## 2018-03-09 DIAGNOSIS — M9902 Segmental and somatic dysfunction of thoracic region: Secondary | ICD-10-CM | POA: Diagnosis not present

## 2018-03-09 DIAGNOSIS — M549 Dorsalgia, unspecified: Secondary | ICD-10-CM

## 2018-03-09 DIAGNOSIS — L03119 Cellulitis of unspecified part of limb: Secondary | ICD-10-CM

## 2018-03-09 DIAGNOSIS — M542 Cervicalgia: Secondary | ICD-10-CM | POA: Diagnosis not present

## 2018-03-09 DIAGNOSIS — M546 Pain in thoracic spine: Secondary | ICD-10-CM | POA: Diagnosis not present

## 2018-03-09 DIAGNOSIS — Z9884 Bariatric surgery status: Secondary | ICD-10-CM

## 2018-03-09 DIAGNOSIS — F319 Bipolar disorder, unspecified: Secondary | ICD-10-CM

## 2018-03-09 NOTE — Assessment & Plan Note (Signed)
Will need repeat labs, she will find out what's requested and will return for labs.

## 2018-03-09 NOTE — Patient Instructions (Signed)
Please call me with the labs needed for follow up of your gastric bypass surgery.  Start exercising. You should be getting 150 minutes of moderate intensity exercise weekly.  It's important to improve your diet by reducing consumption of fast food, fried food, processed snack foods, sugary drinks. Increase consumption of fresh vegetables and fruits, whole grains, water.  Ensure you are drinking 64 ounces of water daily.  Follow up with the nutritionist as discussed.  It was a pleasure to see you today!   Preventive Care 18-39 Years, Female Preventive care refers to lifestyle choices and visits with your health care provider that can promote health and wellness. What does preventive care include?  A yearly physical exam. This is also called an annual well check.  Dental exams once or twice a year.  Routine eye exams. Ask your health care provider how often you should have your eyes checked.  Personal lifestyle choices, including: ? Daily care of your teeth and gums. ? Regular physical activity. ? Eating a healthy diet. ? Avoiding tobacco and drug use. ? Limiting alcohol use. ? Practicing safe sex. ? Taking vitamin and mineral supplements as recommended by your health care provider. What happens during an annual well check? The services and screenings done by your health care provider during your annual well check will depend on your age, overall health, lifestyle risk factors, and family history of disease. Counseling Your health care provider may ask you questions about your:  Alcohol use.  Tobacco use.  Drug use.  Emotional well-being.  Home and relationship well-being.  Sexual activity.  Eating habits.  Work and work Statistician.  Method of birth control.  Menstrual cycle.  Pregnancy history.  Screening You may have the following tests or measurements:  Height, weight, and BMI.  Diabetes screening. This is done by checking your blood sugar (glucose)  after you have not eaten for a while (fasting).  Blood pressure.  Lipid and cholesterol levels. These may be checked every 5 years starting at age 85.  Skin check.  Hepatitis C blood test.  Hepatitis B blood test.  Sexually transmitted disease (STD) testing.  BRCA-related cancer screening. This may be done if you have a family history of breast, ovarian, tubal, or peritoneal cancers.  Pelvic exam and Pap test. This may be done every 3 years starting at age 33. Starting at age 7, this may be done every 5 years if you have a Pap test in combination with an HPV test.  Discuss your test results, treatment options, and if necessary, the need for more tests with your health care provider. Vaccines Your health care provider may recommend certain vaccines, such as:  Influenza vaccine. This is recommended every year.  Tetanus, diphtheria, and acellular pertussis (Tdap, Td) vaccine. You may need a Td booster every 10 years.  Varicella vaccine. You may need this if you have not been vaccinated.  HPV vaccine. If you are 66 or younger, you may need three doses over 6 months.  Measles, mumps, and rubella (MMR) vaccine. You may need at least one dose of MMR. You may also need a second dose.  Pneumococcal 13-valent conjugate (PCV13) vaccine. You may need this if you have certain conditions and were not previously vaccinated.  Pneumococcal polysaccharide (PPSV23) vaccine. You may need one or two doses if you smoke cigarettes or if you have certain conditions.  Meningococcal vaccine. One dose is recommended if you are age 47-21 years and a first-year college student living in a  residence hall, or if you have one of several medical conditions. You may also need additional booster doses.  Hepatitis A vaccine. You may need this if you have certain conditions or if you travel or work in places where you may be exposed to hepatitis A.  Hepatitis B vaccine. You may need this if you have certain  conditions or if you travel or work in places where you may be exposed to hepatitis B.  Haemophilus influenzae type b (Hib) vaccine. You may need this if you have certain risk factors.  Talk to your health care provider about which screenings and vaccines you need and how often you need them. This information is not intended to replace advice given to you by your health care provider. Make sure you discuss any questions you have with your health care provider. Document Released: 07/08/2001 Document Revised: 01/30/2016 Document Reviewed: 03/13/2015 Elsevier Interactive Patient Education  [employer].

## 2018-03-09 NOTE — Progress Notes (Signed)
Subjective:    Patient ID: Kristie Hendricks, female    DOB: June 30, 1985, 32 y.o.   MRN: [id number]  HPI  Kristie Hendricks is a 32 year old female who presents today for complete physical.  Immunizations: -Tetanus: Completed in 2012 -Influenza: Completed at work  Diet: She endorses a poor diet, just saw a nutritionist yesterday.  Breakfast: String cheese, protein shake Lunch: Fast food Dinner: Fast food, frozen meals Snacks: Candy Desserts: Daily  Beverages: Water, soda, social alcohol   Exercise: She is not exercising Eye exam: Completed in 2017 Dental exam: Completes annually   Pap Smear: Completed in 2019  BP Readings from Last 3 Encounters:  03/09/18 127/80  01/22/18 136/76  11/23/17 130/80     Review of Systems  Constitutional: Negative for unexpected weight change.  HENT: Negative for rhinorrhea.   Respiratory: Negative for cough and shortness of breath.   Cardiovascular: Negative for chest pain.  Gastrointestinal: Negative for constipation and diarrhea.  Genitourinary: Negative for difficulty urinating.  Musculoskeletal: Positive for arthralgias.       Chronic lower back pain  Skin: Negative for rash.  Allergic/Immunologic: Negative for environmental allergies.  Neurological: Negative for dizziness, numbness and headaches.  Psychiatric/Behavioral: Positive for sleep disturbance. The patient is nervous/anxious.        Difficulty falling asleep, mind racing thoughts. Melatonin causes second day drowsiness. Never been treated with prescription medication. Following with therapy for bipolar disorder.       Past Medical History:  Diagnosis Date  . Anxiety   . Asthma   . Bipolar disorder (HCC)   . Chickenpox   . Depression   . Frequent headaches   . Hypertension   . Migraines   . Pancreatitis      Social History   Socioeconomic History  . Marital status: Single    Spouse name: Not on file  . Number of children: Not on file  . Years of education: Not on file  .  Highest education level: Not on file  Occupational History  . Not on file  Social Needs  . Financial resource strain: Not on file  . Food insecurity:    Worry: Not on file    Inability: Not on file  . Transportation needs:    Medical: Not on file    Non-medical: Not on file  Tobacco Use  . Smoking status: Never Smoker  . Smokeless tobacco: Never Used  Substance and Sexual Activity  . Alcohol use: Yes    Alcohol/week: 1.0 standard drinks    Types: 1 Glasses of wine per week    Comment: socially   . Drug use: No  . Sexual activity: Yes    Partners: Male    Birth control/protection: IUD, Condom  Lifestyle  . Physical activity:    Days per week: Not on file    Minutes per session: Not on file  . Stress: Not on file  Relationships  . Social connections:    Talks on phone: Not on file    Gets together: Not on file    Attends religious service: Not on file    Active member of club or organization: Not on file    Attends meetings of clubs or organizations: Not on file    Relationship status: Not on file  . Intimate partner violence:    Fear of current or ex partner: Not on file    Emotionally abused: Not on file    Physically abused: Not on file  Forced sexual activity: Not on file  Other Topics Concern  . Not on file  Social History Narrative  . Not on file    Past Surgical History:  Procedure Laterality Date  . DG GALL BLADDER  2003  . GASTRIC BYPASS  2016  . KNEE ARTHROSCOPY Left 08/26/2017    Family History  Problem Relation Age of Onset  . Asthma Mother   . COPD Mother   . Hyperlipidemia Mother   . Hypertension Mother   . Stroke Mother   . Arthritis Father   . Diabetes Father   . Heart disease Father   . Hyperlipidemia Father   . Hypertension Father   . Kidney disease Father   . Asthma Sister   . Depression Sister   . Asthma Brother   . Depression Brother   . Uterine cancer Maternal Grandmother 79    Allergies  Allergen Reactions  .  Clindamycin Anaphylaxis  . Prednisone Other (See Comments) and Nausea And Vomiting    States any steroids pancreatitis pancreatitis  . Dexamethasone Nausea And Vomiting  . Metformin And Related Other (See Comments)    Severe diarrhea  . Other Nausea And Vomiting    Steroid that starts with a C  - causes Pancreatis   . Morphine Anxiety  . Povidone Iodine Hives and Rash    Current Outpatient Medications on File Prior to Visit  Medication Sig Dispense Refill  . Acetaminophen 325 MG CAPS Take by mouth.    . fexofenadine (ALLEGRA) 180 MG tablet Take by mouth.     No current facility-administered medications on file prior to visit.     BP 127/80   Pulse 63   Temp 97.7 F (36.5 C) (Oral)   Ht 5\' 7"  (1.702 m)   Wt 288 lb 12 oz (131 kg)   SpO2 98%   BMI 45.22 kg/m    Objective:   Physical Exam  Constitutional: She is oriented to person, place, and time. She appears well-nourished.  HENT:  Mouth/Throat: No oropharyngeal exudate.  Eyes: Pupils are equal, round, and reactive to light. EOM are normal.  Neck: Neck supple. No thyromegaly present.  Cardiovascular: Normal rate and regular rhythm.  Respiratory: Effort normal and breath sounds normal.  GI: Soft. Bowel sounds are normal. There is no tenderness.  Musculoskeletal: Normal range of motion.  Neurological: She is alert and oriented to person, place, and time.  Skin: Skin is warm and dry.  Psychiatric: She has a normal mood and affect.           Assessment & Plan:

## 2018-03-09 NOTE — Assessment & Plan Note (Signed)
Immunizations UTD. Pap smear UTD. Discussed the importance of a healthy diet and regular exercise in order for weight loss, and to reduce the risk of any potential medical problems. Commended her on establishing care with nutritionist. Exam stable. Labs pending and reviewed. Follow up in 1 year for CPE

## 2018-03-09 NOTE — Assessment & Plan Note (Signed)
Repeat A1C pending. IUD in place. Recommended weight loss through diet and exercise.

## 2018-03-09 NOTE — Assessment & Plan Note (Signed)
Stable in the office today, continue to monitor.  

## 2018-03-09 NOTE — Assessment & Plan Note (Addendum)
Overall doing well with therapy once weekly.  Feels well managed with therapy and without medications.  Will have her try low dose Melatonin at 2.5 mg for insomnia, she will update.

## 2018-03-09 NOTE — Assessment & Plan Note (Signed)
History of SI joint disorder, also with history of left lateral tendon release of left knee. She is following with her chiropractor. She does have one physical therapy appointment remaining through her insurance.

## 2018-03-09 NOTE — Assessment & Plan Note (Signed)
Met with nutritionist yesterday, commended her on this. Recommended to start some form of exercise if possible.

## 2018-03-09 NOTE — Assessment & Plan Note (Signed)
No recent recurrence of cellulitis.  No penicillin injections since May 2019.

## 2018-03-10 ENCOUNTER — Ambulatory Visit (INDEPENDENT_AMBULATORY_CARE_PROVIDER_SITE_OTHER): Payer: BLUE CROSS/BLUE SHIELD | Admitting: Clinical

## 2018-03-10 DIAGNOSIS — F332 Major depressive disorder, recurrent severe without psychotic features: Secondary | ICD-10-CM

## 2018-03-11 DIAGNOSIS — M546 Pain in thoracic spine: Secondary | ICD-10-CM | POA: Diagnosis not present

## 2018-03-11 DIAGNOSIS — M9902 Segmental and somatic dysfunction of thoracic region: Secondary | ICD-10-CM | POA: Diagnosis not present

## 2018-03-11 DIAGNOSIS — M542 Cervicalgia: Secondary | ICD-10-CM | POA: Diagnosis not present

## 2018-03-11 DIAGNOSIS — M9901 Segmental and somatic dysfunction of cervical region: Secondary | ICD-10-CM | POA: Diagnosis not present

## 2018-03-16 DIAGNOSIS — M546 Pain in thoracic spine: Secondary | ICD-10-CM | POA: Diagnosis not present

## 2018-03-16 DIAGNOSIS — M9902 Segmental and somatic dysfunction of thoracic region: Secondary | ICD-10-CM | POA: Diagnosis not present

## 2018-03-16 DIAGNOSIS — M542 Cervicalgia: Secondary | ICD-10-CM | POA: Diagnosis not present

## 2018-03-16 DIAGNOSIS — M9901 Segmental and somatic dysfunction of cervical region: Secondary | ICD-10-CM | POA: Diagnosis not present

## 2018-03-17 ENCOUNTER — Ambulatory Visit (INDEPENDENT_AMBULATORY_CARE_PROVIDER_SITE_OTHER): Payer: BLUE CROSS/BLUE SHIELD | Admitting: Clinical

## 2018-03-17 DIAGNOSIS — F332 Major depressive disorder, recurrent severe without psychotic features: Secondary | ICD-10-CM

## 2018-03-18 DIAGNOSIS — M9901 Segmental and somatic dysfunction of cervical region: Secondary | ICD-10-CM | POA: Diagnosis not present

## 2018-03-18 DIAGNOSIS — M542 Cervicalgia: Secondary | ICD-10-CM | POA: Diagnosis not present

## 2018-03-18 DIAGNOSIS — M9902 Segmental and somatic dysfunction of thoracic region: Secondary | ICD-10-CM | POA: Diagnosis not present

## 2018-03-18 DIAGNOSIS — M546 Pain in thoracic spine: Secondary | ICD-10-CM | POA: Diagnosis not present

## 2018-03-24 ENCOUNTER — Ambulatory Visit (INDEPENDENT_AMBULATORY_CARE_PROVIDER_SITE_OTHER): Payer: BLUE CROSS/BLUE SHIELD | Admitting: Clinical

## 2018-03-24 DIAGNOSIS — F332 Major depressive disorder, recurrent severe without psychotic features: Secondary | ICD-10-CM

## 2018-03-30 ENCOUNTER — Ambulatory Visit (INDEPENDENT_AMBULATORY_CARE_PROVIDER_SITE_OTHER): Payer: BLUE CROSS/BLUE SHIELD | Admitting: Family Medicine

## 2018-03-30 ENCOUNTER — Encounter: Payer: Self-pay | Admitting: Family Medicine

## 2018-03-30 NOTE — Patient Instructions (Addendum)
-   If you have more nights of waking up hungry, write down what you last ate, how much, and what time.  Physical Hunger . Gradual Onset . Open to options . Hunger can wait . Stop when full . Feels satisfying Emotional Hunger . Occurs suddenly . Specific food craving . Looking for instant satisfaction . Keep eating even when full . Results in feeling guilty  - One thing that can help prevent emotional eating is to make sure you optimize satisfaction from the meals and snacks you do have.  Research suggests we get more satisfaction from meals that include at least 3 distinct components.  In addn, having a complete meal in front of [place] (rather than eating piece-meal) tends to be more satisfying.    - As you think ahead for meals, make sure that meal looks like, tastes like, and feels like a REAL meal.    - One definition of a REAL meal:  Would you serve this to a guest in your home, and call it a meal?  - Factors that contribute to wanting to snack in the evenings:  - Not getting any - or enough - food satisfaction earlier in the day (appetite is cumulative).  - Meal not consisting of at least 3 components.   - HAALT, bored, depressed  - Triggers through the day that demand some sense of comfort or pleasure.  This includes feeling burdened or deprived.    Goals: 1. Eat at least REAL 3 meals and 1-2 snacks per day. Never go more than 4-5 hours while awake without eating. Eat breakfast within the first hour of getting up.  (A real meal includes at least a source of protein, starch, and veg's and/or fruit OR: Would you serve this to a guest in your home,and call it a meal?) 2. Limit starchy foods to TWO portions per meal and ONE per snack. ONE portion of a starchy  food is equal to the following:              - ONE slice of bread (or its equivalent, such as half of a hamburger bun).              - 1/2 cup of a "scoopable" starchy food such as potatoes or rice.              - 15 grams of Total  Carbohydrate as shown on food label.              - Every 4 ounces of a sweet drink (including fruit juice) 3. When you are struggling, ask yourself these three "decoding" questions:    1. What am I feeling right now?  (Include physical as well as emotional feelings.)    2. What do I want to feel?    3. What do I truly need right now? Use the Feelings and Needs lists, and WRITE your answers.   You are looking for feelings, not thoughts.  Bring your responses to your follow-up appt.    Follow-up to today's discussion: Core beliefs we develop as young children become the filters through which all information goes as we navigate our way in the world.  These beliefs influence any new information.

## 2018-03-30 NOTE — Progress Notes (Signed)
Medical Nutrition Therapy PCP Vernona Rieger, PA Infectious Disease Physician Cliffton Asters, MD Therapist Charlyne Mom, PhD   Assessment:  Primary concerns today: Weight management.  Kristie Hendricks is interested in better overall nutrition and in better managing emotional eating.  Also needs some help in streamlining meal planning and prep, as well as preventing hypoglycemia.    Learning Readiness: Ready  Kristie Hendricks has started eating 3 meals more frequently, although she feels she has been snacking more at night, which she thinks is mostly mindless eating.  Last week she woke up at 2 or 3 AM, wanting food, which she sometimes got (string cheese, candy, or lasagne).  She has a hard time distinguishing if her hunger is more physical or emotional, but said she suspects she ~90% of the time her snacking is more out of emotional than physical hunger.    One of the biggest barriers to better food choices is a demanding work schedule.  Kristie Hendricks recognizes she has done better with food choices on weeks she has managed to plan ahead, although even on these weeks evening snacking remains problematic.  (This week, their asst mgr quit, so schedule all changed, and she is working more hours.)  Kristie Hendricks is doing a "No Sweets November" with a friend, so is not drinking sweet drinks or having candy/desserts.    Usual eating pattern: 2-3 meals and 1-2 snacks per day.   Usual physical activity: none currently other than part-time job at [employer]. Sleep: Still poor; estimates ~4 1/2 hours per night.     No Food Recall completed today.     Nutritional Diagnosis: Significant progress on NI-5.8.2 Excessive carbohydrate intake As related to glycemic control.  As evidenced by avoidance of sugar-sweetened beverages (even before start of "No-Sweets november).    Intervention:  Nutrition education.  Handouts given during visit include:  After-Visit Summary (AVS)  Demonstrated degree of understanding via:  Teach Back  Barriers to  learning/adherence to lifestyle change: Time constraints from working two jobs.   Monitoring/Evaluation:  Dietary intake, exercise, and body weight in 2 week(s).

## 2018-03-31 ENCOUNTER — Ambulatory Visit: Payer: BLUE CROSS/BLUE SHIELD | Admitting: Clinical

## 2018-03-31 ENCOUNTER — Ambulatory Visit (INDEPENDENT_AMBULATORY_CARE_PROVIDER_SITE_OTHER): Payer: BLUE CROSS/BLUE SHIELD | Admitting: Family Medicine

## 2018-03-31 ENCOUNTER — Encounter: Payer: Self-pay | Admitting: Family Medicine

## 2018-03-31 ENCOUNTER — Encounter: Payer: Self-pay | Admitting: *Deleted

## 2018-03-31 VITALS — BP 138/85 | HR 61 | Wt 290.0 lb

## 2018-03-31 DIAGNOSIS — Z30433 Encounter for removal and reinsertion of intrauterine contraceptive device: Secondary | ICD-10-CM | POA: Diagnosis not present

## 2018-03-31 DIAGNOSIS — E282 Polycystic ovarian syndrome: Secondary | ICD-10-CM | POA: Diagnosis not present

## 2018-03-31 DIAGNOSIS — Z3202 Encounter for pregnancy test, result negative: Secondary | ICD-10-CM | POA: Diagnosis not present

## 2018-03-31 LAB — POCT URINE PREGNANCY: Preg Test, Ur: NEGATIVE

## 2018-03-31 MED ORDER — PARAGARD INTRAUTERINE COPPER IU IUD
1.0000 | INTRAUTERINE_SYSTEM | Freq: Once | INTRAUTERINE | Status: AC
  Administered 2018-03-31: 1 via INTRAUTERINE

## 2018-03-31 NOTE — Progress Notes (Signed)
     Subjective: Kristie Hendricks is a G0P0000 here to discuss PCOS and mood worsening as she approaches period but having irregular periods as well. Has history of bipolar and see counselor regularly.   Objective: BP 138/85   Pulse 61   Wt 290 lb (131.5 kg)   LMP 03/31/2018   BMI 45.42 kg/m   Gen: well appearing, NAD HEENT: no scleral icterus CV: RR Lung: Normal WOB Ext: warm well perfused  GYNECOLOGY CLINIC PROCEDURE NOTE  Kristie Hendricks is a 32 y.o. G0P0000 here for  IUD: Paragard IUD insertion. No GYN concerns.  Last pap smear was on abnormal - next pap in 09/2018.  IUD Insertion Procedure Note Patient identified, informed consent performed, consent signed.   Discussed risks of irregular bleeding, cramping, infection, malpositioning or misplacement of the IUD outside the uterus which may require further procedure such as laparoscopy. Time out was performed.  Urine pregnancy test negative.  Speculum placed in the vagina.  Cervix visualized.  Cleaned with Betadine x 2.  Grasped anteriorly with a single tooth tenaculum.  Uterus sounded to 8 cm.  IUD placed per manufacturer's recommendations.  Strings trimmed to 3 cm. Tenaculum was removed, good hemostasis noted.  Patient tolerated procedure well.   Patient was given post-procedure instructions.  She was advised to have backup contraception for one week.  Patient was also asked to check IUD strings periodically and follow up in 4 weeks for IUD check.  IUD Removal  Patient identified, informed consent performed, consent signed.  Patient was in the dorsal lithotomy position, normal external genitalia was noted.  A speculum was placed in the patient's vagina, normal discharge was noted, no lesions. The cervix was visualized, no lesions, no abnormal discharge.  The strings of the IUD were grasped and pulled using ring forceps. The IUD was removed in its entirety. Patient tolerated the procedure well.    Patient will use Paraguard for contraception.  Routine preventative health maintenance measures emphasized.   Assessment & Plan:  Patient will try Cu-IUD to see how normal cycles affect mood Plan to return in 3 months if no menses, at this time will plan for q 3-4 month provera to induce endometrial shedding for endometrial hyperplasia prevention  Federico Flake, MD  Faculty Practice  Center for [facility], [facility]

## 2018-03-31 NOTE — Progress Notes (Signed)
Having pelvic and lower abdominal pain on and off for few weeks along with some abnormal bleeding. Pt having severe mood swings as well.

## 2018-04-01 DIAGNOSIS — M546 Pain in thoracic spine: Secondary | ICD-10-CM | POA: Diagnosis not present

## 2018-04-01 DIAGNOSIS — M9901 Segmental and somatic dysfunction of cervical region: Secondary | ICD-10-CM | POA: Diagnosis not present

## 2018-04-01 DIAGNOSIS — M9902 Segmental and somatic dysfunction of thoracic region: Secondary | ICD-10-CM | POA: Diagnosis not present

## 2018-04-01 DIAGNOSIS — M542 Cervicalgia: Secondary | ICD-10-CM | POA: Diagnosis not present

## 2018-04-13 ENCOUNTER — Ambulatory Visit (INDEPENDENT_AMBULATORY_CARE_PROVIDER_SITE_OTHER): Payer: BLUE CROSS/BLUE SHIELD | Admitting: Family Medicine

## 2018-04-13 ENCOUNTER — Encounter: Payer: Self-pay | Admitting: Family Medicine

## 2018-04-13 NOTE — Progress Notes (Signed)
Medical Nutrition Therapy PCP Vernona RiegerKatherine Clark, PA Infectious Disease Physician Cliffton AstersJohn Campbell, MD Therapist Charlyne MomJenna Mendelson, PhD   Assessment:  Primary concerns today: Weight management.  Kristie Hendricks is interested in better overall nutrition and in better managing emotional eating.  Also needs some help in streamlining meal planning and prep, as well as preventing hypoglycemia.    Learning Readiness: Ready  Kristie Hendricks was in [place] last week for work, which also allowed her to be with family.  She made an effort to follow her nutrition goals, but this was challenging, and she wanted to enjoy her time there.  Kristie Hendricks heard from a friend about a ketogenic program from [facility], which Kristie Hendricks recognized was very similar to how she ate right after her surgery.  She has decided to eat more in the way, but not as extreme as the [facility] plan.  Since getting home from [place] 3 days ago, she has been using a protein shake for breakfast, and has been limiting her carb's, but not to a point of ketosis.    Kristie Hendricks will be working a lot of hours in the coming weeks, ramping up her hours at [employer] during the holiday season.  She understands that this makes advance planning all the more important.    24-hr recall:  (Up at 7:30 AM) B (8:30 AM)-  Smoothie (2 scoops Orgain pdr; 2 scoops PB pdr, 1.5 c frozen berries, 1 c vanilla lact-free [Carbmaster] milk) Snk ( AM)-  water - 11:20 dentist appt -  L (1:30 PM)-  Smoothie (same except grapes instead of berries;~40 g protein) Snk (3 PM)-  2 string cheese D (8 PM)-  [employer] 12 chx nuggets, ~2 c waffle fries  Snk ( PM)-  2-3 c Chex Mix, water Typical day? Yes.  except lunch was a smoothie b/c of dental procedure.   Usual eating pattern: 2-3 meals and 1-2 snacks per day.   Usual physical activity: none currently other than part-time job at [employer]. Sleep: Still poor; estimates ~4 1/2 hours per night.      Nutritional Diagnosis: Significant progress on NI-5.8.2  Excessive carbohydrate intake As related to glycemic control.  As evidenced by continued avoidance of sugar-sweetened beverages.    Intervention:  Nutrition education.  Handouts given during visit include:  After-Visit Summary (AVS)  Demonstrated degree of understanding via:  Teach Back  Barriers to learning/adherence to lifestyle change: Time constraints from working two jobs.   Monitoring/Evaluation:  Dietary intake, exercise, and body weight in 6 week(s).

## 2018-04-13 NOTE — Patient Instructions (Addendum)
Ideas for low-carb meals:   - Veg and chicken (or other meat) soups (or use canned soups with added veg's and meat)  - Salad with chicken or other meat  - Lettuce wraps with meat/poultry   Key to success in following through with intentions: Planning in advance.     Protein shakes: I encourage you to use as a meal replacement no more than once on most days.  Consider using only one scoop of protein powder when mixing into milk in addn to other protein source such as PB powder.    Goals: 1. Set aside at least a few minutes every Saturday to plan   - What foods you need to buy for the week  - When you can shop and do some food prep  - Plan foods for the next long car ride (including what foods you will bring home with you).  If your mom gives you a loaf of nut bread (or other sweets), consider stowing in the trunk on your way home.   2. Three real meals/day: Protein shake for breakfast, and low-carb meals for lunch and dinner.  3. Snacks: String cheese, yogurt, nuts (2-4 tbsp = 1 portion), fruit, apple with PB, unsweetened applesauce.    Use the Decoding Questions as needed!  (Writing your answers usually gives you the best results.)  Continue to use LoseIt app as long as it is helpful to you.    Check out [employer] on [address] for some produce.

## 2018-04-14 ENCOUNTER — Ambulatory Visit (INDEPENDENT_AMBULATORY_CARE_PROVIDER_SITE_OTHER): Payer: BLUE CROSS/BLUE SHIELD | Admitting: Clinical

## 2018-04-14 DIAGNOSIS — F332 Major depressive disorder, recurrent severe without psychotic features: Secondary | ICD-10-CM

## 2018-04-28 ENCOUNTER — Ambulatory Visit (INDEPENDENT_AMBULATORY_CARE_PROVIDER_SITE_OTHER): Payer: BLUE CROSS/BLUE SHIELD | Admitting: Clinical

## 2018-04-28 DIAGNOSIS — F332 Major depressive disorder, recurrent severe without psychotic features: Secondary | ICD-10-CM | POA: Diagnosis not present

## 2018-04-29 ENCOUNTER — Telehealth: Payer: Self-pay

## 2018-04-29 ENCOUNTER — Ambulatory Visit (INDEPENDENT_AMBULATORY_CARE_PROVIDER_SITE_OTHER): Payer: BLUE CROSS/BLUE SHIELD | Admitting: Family Medicine

## 2018-04-29 ENCOUNTER — Encounter: Payer: Self-pay | Admitting: Family Medicine

## 2018-04-29 VITALS — BP 138/76 | HR 68 | Temp 98.3°F | Ht 67.0 in | Wt 291.0 lb

## 2018-04-29 DIAGNOSIS — J40 Bronchitis, not specified as acute or chronic: Secondary | ICD-10-CM | POA: Diagnosis not present

## 2018-04-29 MED ORDER — ALBUTEROL SULFATE HFA 108 (90 BASE) MCG/ACT IN AERS: 2.0000 | INHALATION_SPRAY | RESPIRATORY_TRACT | 0 refills | Status: DC | PRN

## 2018-04-29 MED ORDER — PROMETHAZINE-DM 6.25-15 MG/5ML PO SYRP: 5.0000 mL | ORAL_SOLUTION | Freq: Every evening | ORAL | 0 refills | Status: DC | PRN

## 2018-04-29 MED ORDER — BENZONATATE 200 MG PO CAPS: 200.0000 mg | ORAL_CAPSULE | Freq: Three times a day (TID) | ORAL | 1 refills | Status: DC | PRN

## 2018-04-29 MED ORDER — BUDESONIDE 90 MCG/ACT IN AEPB: 2.0000 | INHALATION_SPRAY | Freq: Two times a day (BID) | RESPIRATORY_TRACT | 1 refills | Status: DC

## 2018-04-29 NOTE — Patient Instructions (Signed)
Drink lots of fluids  Rest when you can  pulmicort 2 puffs twice daily for 2-3 weeks while sick Albuterol inhaler for rescue when needed   Tessalon for cough  prometh-DM for cough at night (sedating)  Update if not starting to improve in a week or if worsening

## 2018-04-29 NOTE — Telephone Encounter (Signed)
I will see her then  

## 2018-04-29 NOTE — Assessment & Plan Note (Signed)
With bronchospasm in pt with intolerance to prednisone oral  Will try pulmicort inhaler 2 puffs bid for 2-3 wk (until feeling better)  Rest/fluids Disc symptomatic care - see instructions on AVS  Albuterol mdi prn wheeze  Tylenol for pain/fever Tessalon and prometh DM for cough  Update if not starting to improve in a week or if worsening

## 2018-04-29 NOTE — Progress Notes (Signed)
Subjective:    Patient ID: Kristie Hendricks, female    DOB: 05/05/1986, 32 y.o.   MRN: [id number]  HPI Here for uri symptoms -nasal and chest and ST Also ear pain   32 yo pt of NP Clark  Symptoms started Sat with painful cough (chest soreness and tenderness)  Parents both had uris over TG holiday  ST-scratchy /not severe pnd  R ear symptoms - full/pain  Runny nose and congestion - clear d/c (just started)   Cough is prod - phlegm in back of throat-cannot spit is out  Feels tight in chest -? Wheezing   Otc: tylenol and allegra  Cannot take prednisone - gives her pancreatitis   Patient Active Problem List   Diagnosis Date Noted  . Bronchitis 04/29/2018  . Chronic back pain 03/09/2018  . Preventative health care 03/09/2018  . Skin rash 11/23/2017  . Abnormal Pap smear of cervix 11/06/2017  . Morbid obesity (HCC) 10/28/2016  . Status post gastric bypass for obesity 10/28/2016  . Lymphedema of lower extremity 10/28/2016  . 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  . Bipolar disorder (HCC) 10/28/2016  . Hypertension 10/28/2016  . Polycystic ovary syndrome 10/28/2016   Past Medical History:  Diagnosis Date  . Anxiety   . Asthma   . Bipolar disorder (HCC)   . Chickenpox   . Depression   . Frequent headaches   . Hypertension   . Migraines   . Pancreatitis    Past Surgical History:  Procedure Laterality Date  . DG GALL BLADDER  2003  . GASTRIC BYPASS  2016  . KNEE ARTHROSCOPY Left 08/26/2017   Social History   Tobacco Use  . Smoking status: Never Smoker  . Smokeless tobacco: Never Used  Substance Use Topics  . Alcohol use: Yes    Alcohol/week: 1.0 standard drinks    Types: 1 Glasses of wine per week    Comment: socially   . Drug use: No   Family History  Problem Relation Age of Onset  . Asthma Mother   . COPD Mother   . Hyperlipidemia Mother   . Hypertension Mother   . Stroke Mother   . Arthritis Father   . Diabetes Father   . Heart disease  Father   . Hyperlipidemia Father   . Hypertension Father   . Kidney disease Father   . Asthma Sister   . Depression Sister   . Asthma Brother   . Depression Brother   . Uterine cancer Maternal Grandmother 79   Allergies  Allergen Reactions  . Clindamycin Anaphylaxis  . Prednisone Other (See Comments) and Nausea And Vomiting    States any steroids pancreatitis pancreatitis  . Dexamethasone Nausea And Vomiting  . Metformin And Related Other (See Comments)    Severe diarrhea  . Other Nausea And Vomiting    Steroid that starts with a C  - causes Pancreatis   . Morphine Anxiety  . Povidone Iodine Hives and Rash   Current Outpatient Medications on File Prior to Visit  Medication Sig Dispense Refill  . Acetaminophen 325 MG CAPS Take by mouth.    . fexofenadine (ALLEGRA) 180 MG tablet Take by mouth.     No current facility-administered medications on file prior to visit.      Review of Systems  Constitutional: Positive for appetite change and fatigue. Negative for fever.  HENT: Positive for congestion, postnasal drip, rhinorrhea, sinus pressure, sneezing and sore throat. Negative for ear pain.   Eyes: Negative for  pain and discharge.  Respiratory: Positive for cough, chest tightness and wheezing. Negative for shortness of breath and stridor.        Chest wall pain  Cardiovascular: Negative for chest pain.  Gastrointestinal: Negative for diarrhea, nausea and vomiting.  Genitourinary: Negative for frequency, hematuria and urgency.  Musculoskeletal: Negative for arthralgias and myalgias.  Skin: Negative for rash.  Neurological: Positive for headaches. Negative for dizziness, weakness and light-headedness.  Psychiatric/Behavioral: Negative for confusion and dysphoric mood.       Objective:   Physical Exam  Constitutional: She appears well-developed and well-nourished. No distress.  obese and well appearing  Barky cough-persistent   HENT:  Head: Normocephalic and  atraumatic.  Right Ear: External ear normal.  Left Ear: External ear normal.  Mouth/Throat: Oropharynx is clear and moist.  Nares are injected and congested  No sinus tenderness Clear rhinorrhea and post nasal drip   Eyes: Pupils are equal, round, and reactive to light. Conjunctivae and EOM are normal. Right eye exhibits no discharge. Left eye exhibits no discharge. No scleral icterus.  Neck: Normal range of motion. Neck supple.  Cardiovascular: Normal rate, regular rhythm and normal heart sounds.  Pulmonary/Chest: Effort normal. No stridor. No respiratory distress. She has wheezes. She has no rales. She exhibits tenderness.  Harsh bs  Scattered rhonchi Wheeze on forced exp No rales or crackles   Tender CW - bilat/anterior and lateral  No crepitus or skin change   Lymphadenopathy:    She has no cervical adenopathy.  Neurological: She is alert.  Skin: Skin is warm and dry. No rash noted.  Psychiatric: She has a normal mood and affect.          Assessment & Plan:   Problem List Items Addressed This Visit      Respiratory   Bronchitis - Primary    With bronchospasm in pt with intolerance to prednisone oral  Will try pulmicort inhaler 2 puffs bid for 2-3 wk (until feeling better)  Rest/fluids Disc symptomatic care - see instructions on AVS  Albuterol mdi prn wheeze  Tylenol for pain/fever Tessalon and prometh DM for cough  Update if not starting to improve in a week or if worsening

## 2018-04-29 NOTE — Telephone Encounter (Signed)
Pt started with non prod cough on 04/24/18; when coughs both lungs hurt;no fever; sometimes S/T pain is severe, lt earache on and off, nasal congestion started this AM. Pt said sometimes the breathing is difficult after coughing episode but pt states is in no distress and [facility]. Pt scheduled appt with Dr Milinda Antisower 04/29/18 at 10:45.

## 2018-05-05 ENCOUNTER — Ambulatory Visit: Payer: BLUE CROSS/BLUE SHIELD | Admitting: Clinical

## 2018-05-10 ENCOUNTER — Encounter: Payer: Self-pay | Admitting: Radiology

## 2018-05-12 ENCOUNTER — Ambulatory Visit (INDEPENDENT_AMBULATORY_CARE_PROVIDER_SITE_OTHER): Payer: BLUE CROSS/BLUE SHIELD | Admitting: Clinical

## 2018-05-12 DIAGNOSIS — F332 Major depressive disorder, recurrent severe without psychotic features: Secondary | ICD-10-CM

## 2018-06-01 ENCOUNTER — Inpatient Hospital Stay
Admission: EM | Admit: 2018-06-01 | Discharge: 2018-06-04 | DRG: 872 | Disposition: A | Discharge status: Home / Self Care | Payer: BLUE CROSS/BLUE SHIELD | Attending: Family Medicine | Admitting: Family Medicine

## 2018-06-01 ENCOUNTER — Encounter: Payer: Self-pay | Admitting: Primary Care

## 2018-06-01 ENCOUNTER — Emergency Department: Payer: BLUE CROSS/BLUE SHIELD

## 2018-06-01 ENCOUNTER — Inpatient Hospital Stay: Payer: BLUE CROSS/BLUE SHIELD

## 2018-06-01 ENCOUNTER — Other Ambulatory Visit: Payer: Self-pay

## 2018-06-01 ENCOUNTER — Ambulatory Visit (INDEPENDENT_AMBULATORY_CARE_PROVIDER_SITE_OTHER): Payer: BLUE CROSS/BLUE SHIELD | Admitting: Primary Care

## 2018-06-01 ENCOUNTER — Encounter: Payer: Self-pay | Admitting: Emergency Medicine

## 2018-06-01 VITALS — BP 136/82 | HR 107 | Temp 102.7°F | Ht 67.0 in | Wt 290.8 lb

## 2018-06-01 DIAGNOSIS — Z888 Allergy status to other drugs, medicaments and biological substances status: Secondary | ICD-10-CM

## 2018-06-01 DIAGNOSIS — Z885 Allergy status to narcotic agent status: Secondary | ICD-10-CM

## 2018-06-01 DIAGNOSIS — R51 Headache: Secondary | ICD-10-CM | POA: Diagnosis not present

## 2018-06-01 DIAGNOSIS — F319 Bipolar disorder, unspecified: Secondary | ICD-10-CM | POA: Diagnosis present

## 2018-06-01 DIAGNOSIS — Z8261 Family history of arthritis: Secondary | ICD-10-CM | POA: Diagnosis not present

## 2018-06-01 DIAGNOSIS — R197 Diarrhea, unspecified: Secondary | ICD-10-CM | POA: Diagnosis present

## 2018-06-01 DIAGNOSIS — E860 Dehydration: Secondary | ICD-10-CM | POA: Diagnosis not present

## 2018-06-01 DIAGNOSIS — Z79899 Other long term (current) drug therapy: Secondary | ICD-10-CM | POA: Diagnosis not present

## 2018-06-01 DIAGNOSIS — Z841 Family history of disorders of kidney and ureter: Secondary | ICD-10-CM | POA: Diagnosis not present

## 2018-06-01 DIAGNOSIS — R509 Fever, unspecified: Secondary | ICD-10-CM | POA: Diagnosis not present

## 2018-06-01 DIAGNOSIS — R6510 Systemic inflammatory response syndrome (SIRS) of non-infectious origin without acute organ dysfunction: Secondary | ICD-10-CM | POA: Diagnosis not present

## 2018-06-01 DIAGNOSIS — I890 Lymphedema, not elsewhere classified: Secondary | ICD-10-CM | POA: Diagnosis present

## 2018-06-01 DIAGNOSIS — Z9884 Bariatric surgery status: Secondary | ICD-10-CM | POA: Diagnosis not present

## 2018-06-01 DIAGNOSIS — R109 Unspecified abdominal pain: Secondary | ICD-10-CM | POA: Diagnosis not present

## 2018-06-01 DIAGNOSIS — M549 Dorsalgia, unspecified: Secondary | ICD-10-CM | POA: Diagnosis present

## 2018-06-01 DIAGNOSIS — R161 Splenomegaly, not elsewhere classified: Secondary | ICD-10-CM | POA: Diagnosis not present

## 2018-06-01 DIAGNOSIS — Z825 Family history of asthma and other chronic lower respiratory diseases: Secondary | ICD-10-CM | POA: Diagnosis not present

## 2018-06-01 DIAGNOSIS — E876 Hypokalemia: Secondary | ICD-10-CM | POA: Diagnosis not present

## 2018-06-01 DIAGNOSIS — L03116 Cellulitis of left lower limb: Secondary | ICD-10-CM | POA: Diagnosis not present

## 2018-06-01 DIAGNOSIS — R600 Localized edema: Secondary | ICD-10-CM

## 2018-06-01 DIAGNOSIS — Z8349 Family history of other endocrine, nutritional and metabolic diseases: Secondary | ICD-10-CM | POA: Diagnosis not present

## 2018-06-01 DIAGNOSIS — I10 Essential (primary) hypertension: Secondary | ICD-10-CM | POA: Diagnosis present

## 2018-06-01 DIAGNOSIS — Z6841 Body Mass Index (BMI) 40.0 and over, adult: Secondary | ICD-10-CM | POA: Diagnosis not present

## 2018-06-01 DIAGNOSIS — Z8049 Family history of malignant neoplasm of other genital organs: Secondary | ICD-10-CM | POA: Diagnosis not present

## 2018-06-01 DIAGNOSIS — Z8249 Family history of ischemic heart disease and other diseases of the circulatory system: Secondary | ICD-10-CM | POA: Diagnosis not present

## 2018-06-01 DIAGNOSIS — Z823 Family history of stroke: Secondary | ICD-10-CM

## 2018-06-01 DIAGNOSIS — Z818 Family history of other mental and behavioral disorders: Secondary | ICD-10-CM | POA: Diagnosis not present

## 2018-06-01 DIAGNOSIS — J4520 Mild intermittent asthma, uncomplicated: Secondary | ICD-10-CM | POA: Diagnosis present

## 2018-06-01 DIAGNOSIS — Z881 Allergy status to other antibiotic agents status: Secondary | ICD-10-CM

## 2018-06-01 DIAGNOSIS — Z91048 Other nonmedicinal substance allergy status: Secondary | ICD-10-CM | POA: Diagnosis not present

## 2018-06-01 DIAGNOSIS — A419 Sepsis, unspecified organism: Principal | ICD-10-CM | POA: Diagnosis present

## 2018-06-01 DIAGNOSIS — L0390 Cellulitis, unspecified: Secondary | ICD-10-CM | POA: Diagnosis not present

## 2018-06-01 DIAGNOSIS — R1030 Lower abdominal pain, unspecified: Secondary | ICD-10-CM

## 2018-06-01 DIAGNOSIS — F419 Anxiety disorder, unspecified: Secondary | ICD-10-CM | POA: Diagnosis not present

## 2018-06-01 DIAGNOSIS — E282 Polycystic ovarian syndrome: Secondary | ICD-10-CM | POA: Diagnosis present

## 2018-06-01 DIAGNOSIS — Z833 Family history of diabetes mellitus: Secondary | ICD-10-CM

## 2018-06-01 DIAGNOSIS — G8929 Other chronic pain: Secondary | ICD-10-CM | POA: Diagnosis present

## 2018-06-01 LAB — COMPREHENSIVE METABOLIC PANEL
ALT: 26 U/L (ref 0–44)
AST: 23 U/L (ref 15–41)
Albumin: 4.2 g/dL (ref 3.5–5.0)
Alkaline Phosphatase: 103 U/L (ref 38–126)
Anion gap: 10 (ref 5–15)
BUN: 13 mg/dL (ref 6–20)
CO2: 23 mmol/L (ref 22–32)
Calcium: 8.8 mg/dL — ABNORMAL LOW (ref 8.9–10.3)
Chloride: 103 mmol/L (ref 98–111)
Creatinine, Ser: 0.59 mg/dL (ref 0.44–1.00)
GFR calc Af Amer: >60 mL/min (ref >60)
GFR calc non Af Amer: >60 mL/min (ref >60)
GLUCOSE: 119 mg/dL — AB (ref 70–99)
Potassium: 3.5 mmol/L (ref 3.5–5.1)
Sodium: 136 mmol/L (ref 135–145)
TOTAL PROTEIN: 7.4 g/dL (ref 6.5–8.1)
Total Bilirubin: 1.9 mg/dL — ABNORMAL HIGH (ref 0.3–1.2)

## 2018-06-01 LAB — POC URINALSYSI DIPSTICK (AUTOMATED)
BILIRUBIN UA: NEGATIVE
GLUCOSE UA: NEGATIVE
Leukocytes, UA: NEGATIVE
Nitrite, UA: NEGATIVE
PH UA: 6.5 (ref 5.0–8.0)
Protein, UA: NEGATIVE
RBC UA: NEGATIVE
SPEC GRAV UA: 1.015 (ref 1.010–1.025)
Urobilinogen, UA: 0.2 E.U./dL

## 2018-06-01 LAB — CBC WITH DIFFERENTIAL/PLATELET
Abs Immature Granulocytes: 0.46 10*3/uL — ABNORMAL HIGH (ref 0.00–0.07)
BASOS PCT: 0 %
Basophils Absolute: 0.1 10*3/uL (ref 0.0–0.1)
Eosinophils Absolute: 0 10*3/uL (ref 0.0–0.5)
Eosinophils Relative: 0 %
HCT: 37.1 % (ref 36.0–46.0)
Hemoglobin: 12.5 g/dL (ref 12.0–15.0)
Immature Granulocytes: 3 %
Lymphocytes Relative: 3 %
Lymphs Abs: 0.5 10*3/uL — ABNORMAL LOW (ref 0.7–4.0)
MCH: 28.3 pg (ref 26.0–34.0)
MCHC: 33.7 g/dL (ref 30.0–36.0)
MCV: 84.1 fL (ref 80.0–100.0)
Monocytes Absolute: 0.8 10*3/uL (ref 0.1–1.0)
Monocytes Relative: 5 %
Neutro Abs: 14.8 10*3/uL — ABNORMAL HIGH (ref 1.7–7.7)
Neutrophils Relative %: 89 %
PLATELETS: 284 10*3/uL (ref 150–400)
RBC: 4.41 MIL/uL (ref 3.87–5.11)
RDW: 12.8 % (ref 11.5–15.5)
WBC: 16.5 10*3/uL — ABNORMAL HIGH (ref 4.0–10.5)
nRBC: 0 % (ref 0.0–0.2)

## 2018-06-01 LAB — URINALYSIS, COMPLETE (UACMP) WITH MICROSCOPIC
Bilirubin Urine: NEGATIVE
Glucose, UA: NEGATIVE mg/dL
Hgb urine dipstick: NEGATIVE
Ketones, ur: 20 mg/dL — AB
Leukocytes, UA: NEGATIVE
Nitrite: NEGATIVE
Protein, ur: NEGATIVE mg/dL
Specific Gravity, Urine: >1.046 — ABNORMAL HIGH (ref 1.005–1.030)
pH: 6 (ref 5.0–8.0)

## 2018-06-01 LAB — LIPASE, BLOOD: Lipase: 22 U/L (ref 11–51)

## 2018-06-01 LAB — POC INFLUENZA A&B (BINAX/QUICKVUE)
INFLUENZA B, POC: NEGATIVE
Influenza A, POC: NEGATIVE

## 2018-06-01 LAB — INFLUENZA PANEL BY PCR (TYPE A & B)
Influenza A By PCR: NEGATIVE
Influenza B By PCR: NEGATIVE

## 2018-06-01 LAB — LACTIC ACID, PLASMA: Lactic Acid, Venous: 0.9 mmol/L (ref 0.5–1.9)

## 2018-06-01 LAB — POCT URINE PREGNANCY: Preg Test, Ur: NEGATIVE

## 2018-06-01 MED ORDER — FENTANYL CITRATE (PF) 100 MCG/2ML IJ SOLN
50.0000 ug | Freq: Once | INTRAMUSCULAR | Status: AC
  Administered 2018-06-01: 50 ug via INTRAVENOUS
  Filled 2018-06-01: qty 2

## 2018-06-01 MED ORDER — ONDANSETRON HCL 4 MG/2ML IJ SOLN
4.0000 mg | Freq: Once | INTRAMUSCULAR | Status: AC
  Administered 2018-06-01: 4 mg via INTRAVENOUS
  Filled 2018-06-01: qty 2

## 2018-06-01 MED ORDER — ACETAMINOPHEN 325 MG PO TABS
650.0000 mg | ORAL_TABLET | Freq: Four times a day (QID) | ORAL | Status: DC | PRN
  Administered 2018-06-01 – 2018-06-03 (×4): 650 mg via ORAL
  Filled 2018-06-01 (×4): qty 2

## 2018-06-01 MED ORDER — SODIUM CHLORIDE 0.9 % IV BOLUS
1000.0000 mL | Freq: Once | INTRAVENOUS | Status: AC
  Administered 2018-06-01: 1000 mL via INTRAVENOUS

## 2018-06-01 MED ORDER — ACETAMINOPHEN 650 MG RE SUPP: 650.0000 mg | Freq: Four times a day (QID) | RECTAL | Status: DC | PRN

## 2018-06-01 MED ORDER — ENOXAPARIN SODIUM 40 MG/0.4ML ~~LOC~~ SOLN
40.0000 mg | Freq: Two times a day (BID) | SUBCUTANEOUS | Status: DC
  Administered 2018-06-02 – 2018-06-04 (×5): 40 mg via SUBCUTANEOUS
  Filled 2018-06-01 (×5): qty 0.4

## 2018-06-01 MED ORDER — SODIUM CHLORIDE 0.9 % IV SOLN
1.0000 g | Freq: Once | INTRAVENOUS | Status: AC
  Administered 2018-06-01: 1 g via INTRAVENOUS
  Filled 2018-06-01: qty 10

## 2018-06-01 MED ORDER — IBUPROFEN 400 MG PO TABS: 600.0000 mg | ORAL_TABLET | Freq: Four times a day (QID) | ORAL | Status: DC | PRN

## 2018-06-01 MED ORDER — IOPAMIDOL (ISOVUE-300) INJECTION 61%
100.0000 mL | Freq: Once | INTRAVENOUS | Status: AC | PRN
  Administered 2018-06-01: 100 mL via INTRAVENOUS
  Filled 2018-06-01: qty 100

## 2018-06-01 MED ORDER — ONDANSETRON HCL 4 MG/2ML IJ SOLN: 4.0000 mg | Freq: Once | INTRAMUSCULAR | Status: DC | PRN

## 2018-06-01 MED ORDER — ONDANSETRON HCL 4 MG/2ML IJ SOLN: 4.0000 mg | Freq: Four times a day (QID) | INTRAMUSCULAR | Status: DC | PRN

## 2018-06-01 MED ORDER — ONDANSETRON HCL 4 MG PO TABS: 4.0000 mg | ORAL_TABLET | Freq: Four times a day (QID) | ORAL | Status: DC | PRN

## 2018-06-01 MED ORDER — IBUPROFEN 400 MG PO TABS
600.0000 mg | ORAL_TABLET | Freq: Once | ORAL | Status: AC
  Administered 2018-06-01: 600 mg via ORAL

## 2018-06-01 MED ORDER — ACETAMINOPHEN 325 MG PO TABS
650.0000 mg | ORAL_TABLET | Freq: Once | ORAL | Status: AC | PRN
  Administered 2018-06-01: 650 mg via ORAL
  Filled 2018-06-01: qty 2

## 2018-06-01 MED ORDER — OXYCODONE HCL 5 MG PO TABS
5.0000 mg | ORAL_TABLET | Freq: Four times a day (QID) | ORAL | Status: DC | PRN
  Administered 2018-06-01 – 2018-06-03 (×5): 5 mg via ORAL
  Filled 2018-06-01 (×5): qty 1

## 2018-06-01 MED ORDER — SODIUM CHLORIDE 0.9 % IV SOLN
INTRAVENOUS | Status: AC
  Administered 2018-06-01 – 2018-06-02 (×2): via INTRAVENOUS

## 2018-06-01 MED ORDER — VANCOMYCIN HCL 10 G IV SOLR
2000.0000 mg | Freq: Once | INTRAVENOUS | Status: AC
  Administered 2018-06-01: 2000 mg via INTRAVENOUS
  Filled 2018-06-01 (×2): qty 2000

## 2018-06-01 NOTE — Patient Instructions (Addendum)
Go to the emergency department now.  Give your high fevers and abdominal pain you need a CT scan now and blood work.   You could have a kidney infection, kidney stone, appendicitis, or another abdominal problem.   It was a pleasure to see you today!

## 2018-06-01 NOTE — Progress Notes (Signed)
CODE SEPSIS - PHARMACY COMMUNICATION  **Broad Spectrum Antibiotics should be administered within 1 hour of Sepsis diagnosis**  Time Code Sepsis Called/Page Received: 0107 2143  Antibiotics Ordered: 0107 2125  Time of 1st antibiotic administration: 0107 2300  Additional action taken by pharmacy:   If necessary, Name of Provider/Nurse Contacted:     Erich Montane ,PharmD Clinical Pharmacist  06/01/2018  11:59 PM

## 2018-06-01 NOTE — ED Notes (Signed)
Pt to the er for llq pain. Pt states it started on Saturday in the left side of her back at her kidney and has now moved around to the abdomen. Pt reports pain after she urinates and a fever.

## 2018-06-01 NOTE — Assessment & Plan Note (Signed)
Also with left lower back pain, left groin pain, fever of 102.7 despite Tylenol and ibuprofen. She does appear ill and has deteriorated since her visit today. Urinalysis unremarkable, urine pregnancy negative, culture sent. Negative for flu today. She really needs quick full work-up including labs with stat CT.  Given her high fevers, coupled with lower abdominal tenderness on exam and overall presentation she will need more immediate attention.  Differentials include acute appendicitis, infected renal stone, pyelonephritis, other abdominal process. We will send her to the emergency department for further evaluation.

## 2018-06-01 NOTE — ED Notes (Signed)
IV team in with pt 

## 2018-06-01 NOTE — H&P (Signed)
[facility] - [place] at [facility]   PATIENT NAME: Kristie ReinLisa Hendricks    MR#:  [id number]  DATE OF BIRTH:  07/14/1985  DATE OF ADMISSION:  06/01/2018  PRIMARY CARE PHYSICIAN: Doreene Nestlark, Katherine K, NP   REQUESTING/REFERRING PHYSICIAN: Manson PasseyBrown, MD  CHIEF COMPLAINT:   Chief Complaint  Patient presents with  . Abdominal Pain  . Fever  . Back Pain    HISTORY OF PRESENT ILLNESS:  Kristie Hendricks  is a 33 y.o. female who presents with chief complaint as above.  Patient states that 5 days ago she had several episodes of diarrhea.  These were self-limiting and occurred only that day.  She had no other symptoms.  4 days ago she had an episode of left costovertebral angle tenderness.  She states that she thought perhaps she had pulled a muscle.  This was also self-limiting, and resolved.  She states that she then was feeling okay until today when she developed a fever, recurrent left costovertebral angle tenderness, some groin pain, and then diffuse malaise and nausea with vomiting x1.  Here in the ED she was found to be persistently febrile despite antipyretics that she took at home, Tylenol and ibuprofen.  She is tachycardic.  She has an elevated white blood cell count.  Work-up is otherwise largely within normal limits.  She meets sepsis criteria, except that her source is unclear at this time.  Work-up has included influenza test which was negative, urinalysis which showed some dehydration though was not indicative of UTI.  CT abdomen and pelvis did not elucidate a source of infection.  Hospitalist were called for admission and further evaluation and treatment  PAST MEDICAL HISTORY:   Past Medical History:  Diagnosis Date  . Anxiety   . Asthma   . Bipolar disorder (HCC)   . Chickenpox   . Depression   . Frequent headaches   . Hypertension   . Migraines   . Pancreatitis      PAST SURGICAL HISTORY:   Past Surgical History:  Procedure Laterality Date  . DG GALL BLADDER  2003   . GASTRIC BYPASS  2016  . KNEE ARTHROSCOPY Left 08/26/2017     SOCIAL HISTORY:   Social History   Tobacco Use  . Smoking status: Never Smoker  . Smokeless tobacco: Never Used  Substance Use Topics  . Alcohol use: Yes    Alcohol/week: 1.0 standard drinks    Types: 1 Glasses of wine per week    Comment: socially      FAMILY HISTORY:   Family History  Problem Relation Age of Onset  . Asthma Mother   . COPD Mother   . Hyperlipidemia Mother   . Hypertension Mother   . Stroke Mother   . Arthritis Father   . Diabetes Father   . Heart disease Father   . Hyperlipidemia Father   . Hypertension Father   . Kidney disease Father   . Asthma Sister   . Depression Sister   . Asthma Brother   . Depression Brother   . Uterine cancer Maternal Grandmother 79    DRUG ALLERGIES:   Allergies  Allergen Reactions  . Clindamycin Anaphylaxis  . Prednisone Other (See Comments) and Nausea And Vomiting    States any steroids pancreatitis pancreatitis  . Dexamethasone Nausea And Vomiting  . Metformin And Related Other (See Comments)    Severe diarrhea  . Other Nausea And Vomiting    Steroid that starts with a C  - causes  Pancreatis   . Morphine Anxiety  . Povidone Iodine Hives and Rash    MEDICATIONS AT HOME:   Prior to Admission medications   Medication Sig Start Date End Date Taking? Authorizing Provider  Acetaminophen 325 MG CAPS Take by mouth.    [provider]  albuterol (PROVENTIL HFA;VENTOLIN HFA) 108 (90 Base) MCG/ACT inhaler Inhale 2 puffs into the lungs every 4 (four) hours as needed for wheezing or shortness of breath. Patient not taking: Reported on 06/01/2018 04/29/18   Tower, Audrie Gallus, MD  Budesonide 90 MCG/ACT inhaler Inhale 2 puffs into the lungs 2 (two) times daily. 04/29/18   Tower, Audrie Gallus, MD  fexofenadine (ALLEGRA) 180 MG tablet Take by mouth.    [provider]    REVIEW OF SYSTEMS:  Review of Systems  Constitutional: Positive for  chills, fever and malaise/fatigue. Negative for weight loss.  HENT: Negative for ear pain, hearing loss and tinnitus.   Eyes: Negative for blurred vision, double vision, pain and redness.  Respiratory: Negative for cough, hemoptysis and shortness of breath.   Cardiovascular: Negative for chest pain, palpitations, orthopnea and leg swelling.  Gastrointestinal: Positive for diarrhea, nausea and vomiting. Negative for abdominal pain and constipation.  Genitourinary: Positive for flank pain. Negative for dysuria, frequency and hematuria.  Musculoskeletal: Negative for back pain, joint pain and neck pain.  Skin:       No acne, rash, or lesions  Neurological: Negative for dizziness, tremors, focal weakness and weakness.  Endo/Heme/Allergies: Negative for polydipsia. Does not bruise/bleed easily.  Psychiatric/Behavioral: Negative for depression. The patient is not nervous/anxious and does not have insomnia.      VITAL SIGNS:   Vitals:   06/01/18 1743 06/01/18 1745 06/01/18 1908 06/01/18 2134  BP: (!) 148/91   126/65  Pulse: (!) 113   (!) 116  Resp: 18   20  Temp: (!) 103 F (39.4 C)  (!) 102.2 F (39 C) (!) 103.2 F (39.6 C)  TempSrc: Oral  Oral Oral  SpO2: 100%   96%  Weight:  131.8 kg    Height:  5\' 7"  (1.702 m)     Wt Readings from Last 3 Encounters:  06/01/18 131.8 kg  06/01/18 131.9 kg  04/29/18 132 kg    PHYSICAL EXAMINATION:  Physical Exam  Vitals reviewed. Constitutional: She is oriented to person, place, and time. She appears well-developed and well-nourished. No distress.  HENT:  Head: Normocephalic and atraumatic.  Dry mucous membranes  Eyes: Pupils are equal, round, and reactive to light. Conjunctivae and EOM are normal. No scleral icterus.  Neck: Normal range of motion. Neck supple. No JVD present. No thyromegaly present.  Cardiovascular: Regular rhythm and intact distal pulses. Exam reveals no gallop and no friction rub.  No murmur heard. Tachycardic   Respiratory: Effort normal and breath sounds normal. No respiratory distress. She has no wheezes. She has no rales.  GI: Soft. Bowel sounds are normal. She exhibits no distension. There is no abdominal tenderness.  Musculoskeletal: Normal range of motion.        General: Tenderness (Left costovertebral angle tenderness) present. No edema.     Comments: No arthritis, no gout  Lymphadenopathy:    She has no cervical adenopathy.  Neurological: She is alert and oriented to person, place, and time. No cranial nerve deficit.  No dysarthria, no aphasia  Skin: Skin is warm and dry. No rash noted. No erythema.  Psychiatric: She has a normal mood and affect. Her behavior is normal.  Judgment and thought content normal.    LABORATORY PANEL:   CBC Recent Labs  Lab 06/01/18 1952  WBC 16.5*  HGB 12.5  HCT 37.1  PLT 284   ------------------------------------------------------------------------------------------------------------------  Chemistries  Recent Labs  Lab 06/01/18 1952  NA 136  K 3.5  CL 103  CO2 23  GLUCOSE 119*  BUN 13  CREATININE 0.59  CALCIUM 8.8*  AST 23  ALT 26  ALKPHOS 103  BILITOT 1.9*   ------------------------------------------------------------------------------------------------------------------  Cardiac Enzymes No results for input(s): TROPONINI in the last 168 hours. ------------------------------------------------------------------------------------------------------------------  RADIOLOGY:  Ct Abdomen Pelvis W Contrast  Result Date: 06/01/2018 CLINICAL DATA:  33 y/o  F; abdominal pain, back pain, and fever. EXAM: CT ABDOMEN AND PELVIS WITH CONTRAST TECHNIQUE: Multidetector CT imaging of the abdomen and pelvis was performed using the standard protocol following bolus administration of intravenous contrast. CONTRAST:  100mL ISOVUE-300 IOPAMIDOL (ISOVUE-300) INJECTION 61% COMPARISON:  None. FINDINGS: Lower chest: No acute abnormality. Hepatobiliary: No  focal liver abnormality is seen. Status post cholecystectomy. No biliary dilatation. Pancreas: Unremarkable. No pancreatic ductal dilatation or surrounding inflammatory changes. Spleen: Left inferior spleen cyst measuring 21 mm, likely benign. Spleen measures 11.5 x 7.7 x 11.1 cm (volume = 510 cm^3). Adrenals/Urinary Tract: Adrenal glands are unremarkable. Kidneys are normal, without renal calculi, focal lesion, or hydronephrosis. Bladder is unremarkable. 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 Vascular/Lymphatic: No significant vascular findings are present. No enlarged abdominal or pelvic lymph nodes. Reproductive: Uterus and bilateral adnexa are unremarkable. IUD has inferiorly migrated to the cervix. Other: No abdominal wall hernia or abnormality. No abdominopelvic ascites. Musculoskeletal: No acute or significant osseous findings. IMPRESSION: 1. No acute process identified as explanation for abdominal pain. 2. Mild splenomegaly, approximately 510 cc. 3. IUD has inferiorly migrated to cervix. Electronically Signed   By: Mitzi HansenLance  Furusawa-Stratton M.D.   On: 06/01/2018 21:05    EKG:   Orders placed or performed during the hospital encounter of 06/01/18  . ED EKG 12-Lead  . ED EKG 12-Lead    IMPRESSION AND PLAN:  Principal Problem:   SIRS (systemic inflammatory response syndrome) (HCC) -her HPI is clinically consistent with something like pyelonephritis, though laboratory work-up here in the ED does not strongly support this.  Source of infection is unclear at this time, though differential would also include some viral GI illness versus bacteremia.  She does have an abnormality on her spleen which was interpreted by the radiologist as a cyst most likely benign.  IV antibiotics were administered, lactic acid was within normal limits, blood pressure stable, blood cultures were sent, urine culture ordered, chest x-ray is also pending. Active Problems:    Dehydration -aggressive IV fluids tonight   Hypertension -hold any antihypertensives for now as patient's blood pressure is normotensive  Chart review performed and case discussed with ED provider. Labs, imaging and/or ECG reviewed by provider and discussed with patient/family. Management plans discussed with the patient and/or family.  DVT PROPHYLAXIS: SubQ lovenox   GI PROPHYLAXIS:  None  ADMISSION STATUS: Inpatient     CODE STATUS: Full  TOTAL TIME TAKING CARE OF THIS PATIENT: 45 minutes.   Djibril Glogowski FIELDING 06/01/2018, 10:26 PM  [facility]  Office  [phone]  CC: Primary care physician; Doreene Nestlark, Katherine K, NP  Note:  This document was prepared using Dragon voice recognition software and may include unintentional dictation errors.

## 2018-06-01 NOTE — ED Notes (Signed)
Patient transported to CT 

## 2018-06-01 NOTE — ED Provider Notes (Signed)
[facility] Emergency Department Provider Note    First MD Initiated Contact with Patient 06/01/18 1935     (approximate)  I have reviewed the triage vital signs and the nursing notes.   HISTORY  Chief Complaint Abdominal Pain; Fever; and Back Pain    HPI Kristie Hendricks is a 33 y.o. female with below list of chronic medical conditions including multiple episodes of cellulitis presents to the emergency department with left flank/back pain with associated fever temperature 103 on arrival.  Patient also admits to right lower quadrant abdominal pain as well.  Patient states current pain score 7 out of 10.  Patient denies any urinary symptoms.  Patient denies any nausea or vomiting.   Past Medical History:  Diagnosis Date  . Anxiety   . Asthma   . Bipolar disorder (HCC)   . Chickenpox   . Depression   . Frequent headaches   . Hypertension   . Migraines   . Pancreatitis     Patient Active Problem List   Diagnosis Date Noted  . Lower abdominal pain 06/01/2018  . Sepsis (HCC) 06/01/2018  . Dehydration 06/01/2018  . Bronchitis 04/29/2018  . Chronic back pain 03/09/2018  . Preventative health care 03/09/2018  . Skin rash 11/23/2017  . Abnormal Pap smear of cervix 11/06/2017  . Morbid obesity (HCC) 10/28/2016  . Status post gastric bypass for obesity 10/28/2016  . Lymphedema of lower extremity 10/28/2016  . 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  . Bipolar disorder (HCC) 10/28/2016  . Hypertension 10/28/2016  . Polycystic ovary syndrome 10/28/2016    Past Surgical History:  Procedure Laterality Date  . DG GALL BLADDER  2003  . GASTRIC BYPASS  2016  . KNEE ARTHROSCOPY Left 08/26/2017    Prior to Admission medications   Medication Sig Start Date End Date Taking? Authorizing Provider  Acetaminophen 325 MG CAPS Take by mouth.    [provider]  albuterol (PROVENTIL HFA;VENTOLIN HFA) 108 (90 Base) MCG/ACT inhaler Inhale 2 puffs  into the lungs every 4 (four) hours as needed for wheezing or shortness of breath. Patient not taking: Reported on 06/01/2018 04/29/18   Tower, Audrie GallusMarne A, MD  Budesonide 90 MCG/ACT inhaler Inhale 2 puffs into the lungs 2 (two) times daily. 04/29/18   Tower, Audrie GallusMarne A, MD  fexofenadine (ALLEGRA) 180 MG tablet Take by mouth.    [provider]    Allergies Clindamycin; Prednisone; Dexamethasone; Metformin and related; Other; Morphine; and Povidone iodine  Family History  Problem Relation Age of Onset  . Asthma Mother   . COPD Mother   . Hyperlipidemia Mother   . Hypertension Mother   . Stroke Mother   . Arthritis Father   . Diabetes Father   . Heart disease Father   . Hyperlipidemia Father   . Hypertension Father   . Kidney disease Father   . Asthma Sister   . Depression Sister   . Asthma Brother   . Depression Brother   . Uterine cancer Maternal Grandmother [phone]    Social History Social History   Tobacco Use  . Smoking status: Never Smoker  . Smokeless tobacco: Never Used  Substance Use Topics  . Alcohol use: Yes    Alcohol/week: 1.0 standard drinks    Types: 1 Glasses of wine per week    Comment: socially   . Drug use: No    Review of Systems Constitutional: No fever/chills Eyes: No visual changes. ENT: No sore throat. Cardiovascular: Denies chest  pain. Respiratory: Denies shortness of breath. Gastrointestinal: Positive for abdominal pain. Genitourinary: Negative for dysuria. Musculoskeletal: Negative for neck pain.  Negative for back pain. Integumentary: Negative for rash. Neurological: Negative for headaches, focal weakness or numbness.   ____________________________________________   PHYSICAL EXAM:  VITAL SIGNS: ED Triage Vitals  Enc Vitals Group     BP 06/01/18 1743 (!) 148/91     Pulse Rate 06/01/18 1743 (!) 113     Resp 06/01/18 1743 18     Temp 06/01/18 1743 (!) 103 F (39.4 C)     Temp Source 06/01/18 1743 Oral     SpO2 06/01/18 1743 100 %       Weight 06/01/18 1745 131.8 kg (290 lb 9.1 oz)     Height 06/01/18 1745 1.702 m (5\' 7" )     Head Circumference --      Peak Flow --      Pain Score 06/01/18 1744 10     Pain Loc --      Pain Edu? --      Excl. in GC? --     Constitutional: Alert and oriented.  Apparent discomfort  eyes: Conjunctivae are normal. Mouth/Throat: Mucous membranes are dry.  Oropharynx non-erythematous. Neck: No stridor.   Cardiovascular: Normal rate, regular rhythm. Good peripheral circulation. Grossly normal heart sounds. Respiratory: Normal respiratory effort.  No retractions. Lungs CTAB. Gastrointestinal: Left lower quadrant right lower quadrant tenderness to palpation.  No distention. Musculoskeletal: No lower extremity tenderness nor edema. No gross deformities of extremities. Neurologic:  Normal speech and language. No gross focal neurologic deficits are appreciated.  Skin:  Skin is warm, dry and intact. No rash noted.  No evidence of cellulitis Psychiatric: Mood and affect are normal. Speech and behavior are normal.  ____________________________________________   LABS (all labs ordered are listed, but only abnormal results are displayed)  Labs Reviewed  COMPREHENSIVE METABOLIC PANEL - Abnormal; Notable for the following components:      Result Value   Glucose, Bld 119 (*)    Calcium 8.8 (*)    Total Bilirubin 1.9 (*)    All other components within normal limits  URINALYSIS, COMPLETE (UACMP) WITH MICROSCOPIC - Abnormal; Notable for the following components:   Color, Urine YELLOW (*)    APPearance CLEAR (*)    Specific Gravity, Urine >1.046 (*)    Ketones, ur 20 (*)    Bacteria, UA RARE (*)    All other components within normal limits  CBC WITH DIFFERENTIAL/PLATELET - Abnormal; Notable for the following components:   WBC 16.5 (*)    Neutro Abs 14.8 (*)    Lymphs Abs 0.5 (*)    Abs Immature Granulocytes 0.46 (*)    All other components within normal limits  CULTURE, BLOOD (ROUTINE X  2)  CULTURE, BLOOD (ROUTINE X 2)  LIPASE, BLOOD  LACTIC ACID, PLASMA  LACTIC ACID, PLASMA  INFLUENZA PANEL BY PCR (TYPE A & B)  POC URINE PREG, ED   _________________________  RADIOLOGY I, [place] Dewayne Shorter, personally viewed and evaluated these images (plain radiographs) as part of my medical decision making, as well as reviewing the written report by the radiologist.  ED MD interpretation: No acute process seen on CT scan to explain the patient's discomfort radiologist.  Official radiology report(s): Ct Abdomen Pelvis W Contrast  Result Date: 06/01/2018 CLINICAL DATA:  33 y/o  F; abdominal pain, back pain, and fever. EXAM: CT ABDOMEN AND PELVIS WITH CONTRAST TECHNIQUE: Multidetector CT imaging of the abdomen and  pelvis was performed using the standard protocol following bolus administration of intravenous contrast. CONTRAST:  100mL ISOVUE-300 IOPAMIDOL (ISOVUE-300) INJECTION 61% COMPARISON:  None. FINDINGS: Lower chest: No acute abnormality. Hepatobiliary: No focal liver abnormality is seen. Status post cholecystectomy. No biliary dilatation. Pancreas: Unremarkable. No pancreatic ductal dilatation or surrounding inflammatory changes. Spleen: Left inferior spleen cyst measuring 21 mm, likely benign. Spleen measures 11.5 x 7.7 x 11.1 cm (volume = 510 cm^3). Adrenals/Urinary Tract: Adrenal glands are unremarkable. Kidneys are normal, without renal calculi, focal lesion, or hydronephrosis. Bladder is unremarkable. 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 Vascular/Lymphatic: No significant vascular findings are present. No enlarged abdominal or pelvic lymph nodes. Reproductive: Uterus and bilateral adnexa are unremarkable. IUD has inferiorly migrated to the cervix. Other: No abdominal wall hernia or abnormality. No abdominopelvic ascites. Musculoskeletal: No acute or significant osseous findings. IMPRESSION: 1. No acute process identified as explanation for  abdominal pain. 2. Mild splenomegaly, approximately 510 cc. 3. IUD has inferiorly migrated to cervix. Electronically Signed   By: Mitzi HansenLance  Furusawa-Stratton M.D.   On: 06/01/2018 21:05      Critical Care performed:  .Critical Care Performed by: Darci CurrentBrown, [place] N, MD Authorized by: Darci CurrentBrown, [place] N, MD   Critical care provider statement:    Critical care time (minutes):  45   Critical care time was exclusive of:  Separately billable procedures and treating other patients   Critical care was necessary to treat or prevent imminent or life-threatening deterioration of the following conditions:  Sepsis   Critical care was time spent personally by me on the following activities:  Development of treatment plan with patient or surrogate, discussions with consultants, evaluation of patient's response to treatment, examination of patient, obtaining history from patient or surrogate, ordering and performing treatments and interventions, ordering and review of laboratory studies, ordering and review of radiographic studies, pulse oximetry, re-evaluation of patient's condition and review of old charts   I assumed direction of critical care for this patient from another provider in my specialty: no       ____________________________________________   INITIAL IMPRESSION / ASSESSMENT AND PLAN / ED COURSE  As part of my medical decision making, I reviewed the following data within the electronic MEDICAL RECORD NUMBER   33 year old female presenting with above-stated history and physical exam concerning for sepsis patient is tachycardic febrile with leukocytosis.  Patient given IV ceftriaxone no clear etiology for the patient's sepsis identified.  CT scan of the abdomen pelvis revealed no explanation for the patient's abdominal discomfort.  Patient discussed with Dr. Anne HahnWillis for hospital admission for further evaluation and management.  _____________________________  FINAL CLINICAL IMPRESSION(S) / ED  DIAGNOSES  Final diagnoses:  Sepsis, due to unspecified organism, unspecified whether acute organ dysfunction present [facility](HCC)     MEDICATIONS GIVEN DURING THIS VISIT:  Medications  ondansetron (ZOFRAN) injection 4 mg (has no administration in time range)  cefTRIAXone (ROCEPHIN) 1 g in sodium chloride 0.9 % 100 mL IVPB (has no administration in time range)  acetaminophen (TYLENOL) tablet 650 mg (650 mg Oral Given 06/01/18 1803)  sodium chloride 0.9 % bolus 1,000 mL (0 mLs Intravenous Stopped 06/01/18 2100)  fentaNYL (SUBLIMAZE) injection 50 mcg (50 mcg Intravenous Given 06/01/18 2010)  ondansetron (ZOFRAN) injection 4 mg (4 mg Intravenous Given 06/01/18 2010)  iopamidol (ISOVUE-300) 61 % injection 100 mL (100 mLs Intravenous Contrast Given 06/01/18 2043)  sodium chloride 0.9 % bolus 1,000 mL (1,000 mLs Intravenous New Bag/Given 06/01/18 2136)  ibuprofen (  ADVIL,MOTRIN) tablet 600 mg (600 mg Oral Given 06/01/18 2137)     ED Discharge Orders    None       Note:  This document was prepared using Dragon voice recognition software and may include unintentional dictation errors.    Darci Current, MD 06/01/18 2226

## 2018-06-01 NOTE — Progress Notes (Signed)
Subjective:    Patient ID: Kristie Hendricks, female    DOB: Apr 05, 1986, 33 y.o.   MRN: [id number]  HPI  Kristie Hendricks is a 33 year old female with a history of gastric bypass surgery, recurrent cellulitis, PCOS who presents today with a chief complaint of fever.   Her symptoms began Friday last week with diarrhea and nausea. She was having diarrhea every 15 minutes for 2 hours at that time. Felt improved the following day up until that afternoon when she developed intermittent left lower back pain. Since then she's noticed intermittent left lower back pain.   She had a gallon of water yesterday and noticed her urine was clearer. This morning she woke up with chills. She checked her temperature at 2 pm today and had a fever of 102.5. She's taken one dose of Ibuprofen and 1 dose of Tylenol without improvement in fever.  Her fever in the clinic today is 102.7.  She's also experiencing bilateral lower quadrant abdominal pain, left groin pain, tenderness to left posterior calf, headaches, also noticed that her urine was more dark this morning. She continues to feel nauseated.   She denies dysuria, cough, rhinorrhea, sore throat, diarrhea, vomiting, redness to her lower extremities. LMP was 05/23/18, she has a copper IUD in place.  Review of Systems  Constitutional: Positive for chills, fatigue and fever.  HENT: Negative for congestion, rhinorrhea and sore throat.   Respiratory: Negative for cough.   Gastrointestinal: Positive for abdominal pain, diarrhea and nausea. Negative for vomiting.  Genitourinary: Positive for flank pain and pelvic pain. Negative for dysuria, frequency, hematuria, vaginal bleeding and vaginal discharge.       Past Medical History:  Diagnosis Date  . Anxiety   . Asthma   . Bipolar disorder (HCC)   . Chickenpox   . Depression   . Frequent headaches   . Hypertension   . Migraines   . Pancreatitis      Social History   Socioeconomic History  . Marital status: Single   Spouse name: Not on file  . Number of children: Not on file  . Years of education: Not on file  . Highest education level: Not on file  Occupational History  . Not on file  Social Needs  . Financial resource strain: Not on file  . Food insecurity:    Worry: Not on file    Inability: Not on file  . Transportation needs:    Medical: Not on file    Non-medical: Not on file  Tobacco Use  . Smoking status: Never Smoker  . Smokeless tobacco: Never Used  Substance and Sexual Activity  . Alcohol use: Yes    Alcohol/week: 1.0 standard drinks    Types: 1 Glasses of wine per week    Comment: socially   . Drug use: No  . Sexual activity: Yes    Partners: Male    Birth control/protection: I.U.D., Condom  Lifestyle  . Physical activity:    Days per week: Not on file    Minutes per session: Not on file  . Stress: Not on file  Relationships  . Social connections:    Talks on phone: Not on file    Gets together: Not on file    Attends religious service: Not on file    Active member of club or organization: Not on file    Attends meetings of clubs or organizations: Not on file    Relationship status: Not on file  . Intimate partner  violence:    Fear of current or ex partner: Not on file    Emotionally abused: Not on file    Physically abused: Not on file    Forced sexual activity: Not on file  Other Topics Concern  . Not on file  Social History Narrative  . Not on file    Past Surgical History:  Procedure Laterality Date  . DG GALL BLADDER  2003  . GASTRIC BYPASS  2016  . KNEE ARTHROSCOPY Left 08/26/2017    Family History  Problem Relation Age of Onset  . Asthma Mother   . COPD Mother   . Hyperlipidemia Mother   . Hypertension Mother   . Stroke Mother   . Arthritis Father   . Diabetes Father   . Heart disease Father   . Hyperlipidemia Father   . Hypertension Father   . Kidney disease Father   . Asthma Sister   . Depression Sister   . Asthma Brother   .  Depression Brother   . Uterine cancer Maternal Grandmother 79    Allergies  Allergen Reactions  . Clindamycin Anaphylaxis  . Prednisone Other (See Comments) and Nausea And Vomiting    States any steroids pancreatitis pancreatitis  . Dexamethasone Nausea And Vomiting  . Metformin And Related Other (See Comments)    Severe diarrhea  . Other Nausea And Vomiting    Steroid that starts with a C  - causes Pancreatis   . Morphine Anxiety  . Povidone Iodine Hives and Rash    Current Outpatient Medications on File Prior to Visit  Medication Sig Dispense Refill  . Acetaminophen 325 MG CAPS Take by mouth.    . Budesonide 90 MCG/ACT inhaler Inhale 2 puffs into the lungs 2 (two) times daily. 1 Inhaler 1  . fexofenadine (ALLEGRA) 180 MG tablet Take by mouth.    Marland Kitchen. albuterol (PROVENTIL HFA;VENTOLIN HFA) 108 (90 Base) MCG/ACT inhaler Inhale 2 puffs into the lungs every 4 (four) hours as needed for wheezing or shortness of breath. (Patient not taking: Reported on 06/01/2018) 1 Inhaler 0   No current facility-administered medications on file prior to visit.     BP 136/82   Pulse (!) 107   Temp (!) 102.7 F (39.3 C) (Oral)   Ht 5\' 7"  (1.702 m)   Wt 290 lb 12 oz (131.9 kg)   SpO2 97%   BMI 45.54 kg/m    Objective:   Physical Exam  Constitutional: She appears well-nourished. She appears ill.  HENT:  Right Ear: Tympanic membrane and ear canal normal.  Left Ear: Tympanic membrane and ear canal normal.  Nose: No mucosal edema. Right sinus exhibits no maxillary sinus tenderness and no frontal sinus tenderness. Left sinus exhibits no maxillary sinus tenderness and no frontal sinus tenderness.  Mouth/Throat: Oropharynx is clear and moist.  Neck: Neck supple.  Cardiovascular: Normal rate and regular rhythm.  Respiratory: Effort normal and breath sounds normal. She has no wheezes.  GI: Soft. Normal appearance and bowel sounds are normal. There is abdominal tenderness in the right lower quadrant,  suprapubic area and left lower quadrant.  Skin: Skin is warm and dry.  Redness to cheeks bilaterally           Assessment & Plan:

## 2018-06-01 NOTE — ED Triage Notes (Signed)
Says abd pain and back pain.  Went to dr and now she has fever.  Also headache.  They sent her here for possible ct scan due to right lower quad tenderness. Also back of left leg tender.

## 2018-06-02 ENCOUNTER — Ambulatory Visit: Payer: BLUE CROSS/BLUE SHIELD | Admitting: Clinical

## 2018-06-02 ENCOUNTER — Inpatient Hospital Stay: Payer: BLUE CROSS/BLUE SHIELD

## 2018-06-02 LAB — CBC
HCT: 38.6 % (ref 36.0–46.0)
Hemoglobin: 12.8 g/dL (ref 12.0–15.0)
MCH: 28.2 pg (ref 26.0–34.0)
MCHC: 33.2 g/dL (ref 30.0–36.0)
MCV: 85 fL (ref 80.0–100.0)
Platelets: 280 10*3/uL (ref 150–400)
RBC: 4.54 MIL/uL (ref 3.87–5.11)
RDW: 13.2 % (ref 11.5–15.5)
WBC: 18 10*3/uL — ABNORMAL HIGH (ref 4.0–10.5)
nRBC: 0 % (ref 0.0–0.2)

## 2018-06-02 LAB — BASIC METABOLIC PANEL
ANION GAP: 8 (ref 5–15)
BUN: 9 mg/dL (ref 6–20)
CO2: 24 mmol/L (ref 22–32)
Calcium: 8.2 mg/dL — ABNORMAL LOW (ref 8.9–10.3)
Chloride: 106 mmol/L (ref 98–111)
Creatinine, Ser: 0.75 mg/dL (ref 0.44–1.00)
Glucose, Bld: 122 mg/dL — ABNORMAL HIGH (ref 70–99)
Potassium: 3.4 mmol/L — ABNORMAL LOW (ref 3.5–5.1)
Sodium: 138 mmol/L (ref 135–145)

## 2018-06-02 LAB — MRSA PCR SCREENING: MRSA BY PCR: NEGATIVE

## 2018-06-02 MED ORDER — PENICILLIN V POTASSIUM 500 MG PO TABS
500.0000 mg | ORAL_TABLET | Freq: Four times a day (QID) | ORAL | Status: DC
  Administered 2018-06-03 (×2): 500 mg via ORAL
  Filled 2018-06-02 (×4): qty 1

## 2018-06-02 MED ORDER — IBUPROFEN 400 MG PO TABS
600.0000 mg | ORAL_TABLET | Freq: Four times a day (QID) | ORAL | Status: DC | PRN
  Administered 2018-06-02 – 2018-06-04 (×5): 600 mg via ORAL
  Filled 2018-06-02 (×5): qty 2

## 2018-06-02 MED ORDER — SODIUM CHLORIDE 0.9 % IV SOLN
2.0000 g | Freq: Three times a day (TID) | INTRAVENOUS | Status: DC
  Administered 2018-06-02: 2 g via INTRAVENOUS
  Filled 2018-06-02 (×3): qty 2

## 2018-06-02 MED ORDER — IBUPROFEN 400 MG PO TABS: 400.0000 mg | ORAL_TABLET | Freq: Four times a day (QID) | ORAL | Status: DC | PRN

## 2018-06-02 MED ORDER — SODIUM CHLORIDE 0.9 % IV SOLN
5.0000 10*6.[IU] | INTRAVENOUS | Status: AC
  Administered 2018-06-02 – 2018-06-03 (×5): 5 10*6.[IU] via INTRAVENOUS
  Filled 2018-06-02 (×6): qty 5

## 2018-06-02 MED ORDER — VANCOMYCIN HCL 10 G IV SOLR
1250.0000 mg | Freq: Three times a day (TID) | INTRAVENOUS | Status: DC
  Administered 2018-06-02: 1250 mg via INTRAVENOUS
  Filled 2018-06-02 (×3): qty 1250

## 2018-06-02 MED ORDER — SODIUM CHLORIDE 0.9 % IV SOLN
INTRAVENOUS | Status: DC | PRN
  Administered 2018-06-02 – 2018-06-04 (×7): 1000 mL via INTRAVENOUS

## 2018-06-02 MED ORDER — POTASSIUM CHLORIDE CRYS ER 20 MEQ PO TBCR
40.0000 meq | EXTENDED_RELEASE_TABLET | Freq: Once | ORAL | Status: AC
  Administered 2018-06-02: 40 meq via ORAL
  Filled 2018-06-02: qty 2

## 2018-06-02 NOTE — Progress Notes (Signed)
[employer] [facility] [facility] [place] [facility]   PATIENT NAME: Kristie ReinLisa Hendricks    MR#:  [id number]  DATE OF BIRTH:  12/16/1985  SUBJECTIVE:   Patient presented with fever.  It appears that she has cellulitis.  She has had recurrent cellulitis.  She reports that penicillin is the only antibiotic that works for her.  She has a headache but reports no neck stiffness.  She does get headaches occasionally.  REVIEW OF SYSTEMS:    Review of Systems  Constitutional: Positive for fever, no chills weight loss HENT: Negative for ear pain, nosebleeds, congestion, facial swelling, rhinorrhea, neck pain, neck stiffness and ear discharge.   Respiratory: Negative for cough, shortness of breath, wheezing  Cardiovascular: Negative for chest pain, palpitations and leg swelling.  Gastrointestinal: Negative for heartburn, abdominal pain, vomiting, diarrhea or consitpation Genitourinary: Negative for dysuria, urgency, frequency, hematuria Musculoskeletal: Negative for back pain or joint pain Neurological: Negative for dizziness, seizures, syncope, focal weakness,  numbness and headaches.  Hematological: Does not bruise/bleed easily.  Psychiatric/Behavioral: Negative for hallucinations, confusion, dysphoric mood  Skin: Left lower extremity cellulitis  Tolerating Diet: yes      DRUG ALLERGIES:   Allergies  Allergen Reactions  . Clindamycin Anaphylaxis  . Prednisone Other (See Comments) and Nausea And Vomiting    States any steroids pancreatitis pancreatitis  . Dexamethasone Nausea And Vomiting  . Metformin And Related Other (See Comments)    Severe diarrhea  . Other Nausea And Vomiting    Steroid that starts with a C  - causes Pancreatis   . Morphine Anxiety  . Povidone Iodine Hives and Rash    VITALS:  Blood pressure 121/77, pulse 95, temperature 99.5 F (37.5 C), temperature source Oral, resp. rate 20, height 5\' 7"  (1.702 m), weight 133.1 kg, SpO2 100 %.  PHYSICAL EXAMINATION:   Constitutional: Appears well-developed and well-nourished. No distress. HENT: Normocephalic. Marland Kitchen. Oropharynx is clear and moist.  Eyes: Conjunctivae and EOM are normal. PERRLA, no scleral icterus.  Neck: Normal ROM. Neck supple. No JVD. No tracheal deviation. CVS: RRR, S1/S2 +, no murmurs, no gallops, no carotid bruit.  Pulmonary: Effort and breath sounds normal, no stridor, rhonchi, wheezes, rales.  Abdominal: Soft. BS +,  no distension, tenderness, rebound or guarding.  Musculoskeletal: Normal range of motion. No edema and no tenderness.  Neuro: Alert. CN 2-12 grossly intact. No focal deficits. Skin: Left lower extremity with very mild swelling and erythema, tenderness and hot psychiatric: Normal mood and affect.      LABORATORY PANEL:   CBC Recent Labs  Lab 06/02/18 0539  WBC 18.0*  HGB 12.8  HCT 38.6  PLT 280   ------------------------------------------------------------------------------------------------------------------  Chemistries  Recent Labs  Lab 06/01/18 1952 06/02/18 0539  NA 136 138  K 3.5 3.4*  CL 103 106  CO2 23 24  GLUCOSE 119* 122*  BUN 13 9  CREATININE 0.59 0.75  CALCIUM 8.8* 8.2*  AST 23  --   ALT 26  --   ALKPHOS 103  --   BILITOT 1.9*  --    ------------------------------------------------------------------------------------------------------------------  Cardiac Enzymes No results for input(s): TROPONINI in the last 168 hours. ------------------------------------------------------------------------------------------------------------------  RADIOLOGY:  Dg Chest 2 View  Result Date: 06/01/2018 CLINICAL DATA:  Fever EXAM: CHEST - 2 VIEW COMPARISON:  None. FINDINGS: The heart size and mediastinal contours are within normal limits. Both lungs are clear. The visualized skeletal structures are unremarkable. IMPRESSION: No active cardiopulmonary disease. Electronically Signed   By: Adrian ProwsKim  Fujinaga M.D.  On: 06/01/2018 23:11   Ct Abdomen Pelvis W  Contrast  Result Date: 06/01/2018 CLINICAL DATA:  33 y/o  F; abdominal pain, back pain, and fever. EXAM: CT ABDOMEN AND PELVIS WITH CONTRAST TECHNIQUE: Multidetector CT imaging of the abdomen and pelvis was performed using the standard protocol following bolus administration of intravenous contrast. CONTRAST:  100mL ISOVUE-300 IOPAMIDOL (ISOVUE-300) INJECTION 61% COMPARISON:  None. FINDINGS: Lower chest: No acute abnormality. Hepatobiliary: No focal liver abnormality is seen. Status post cholecystectomy. No biliary dilatation. Pancreas: Unremarkable. No pancreatic ductal dilatation or surrounding inflammatory changes. Spleen: Left inferior spleen cyst measuring 21 mm, likely benign. Spleen measures 11.5 x 7.7 x 11.1 cm (volume = 510 cm^3). Adrenals/Urinary Tract: Adrenal glands are unremarkable. Kidneys are normal, without renal calculi, focal lesion, or hydronephrosis. Bladder is unremarkable. 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 Vascular/Lymphatic: No significant vascular findings are present. No enlarged abdominal or pelvic lymph nodes. Reproductive: Uterus and bilateral adnexa are unremarkable. IUD has inferiorly migrated to the cervix. Other: No abdominal wall hernia or abnormality. No abdominopelvic ascites. Musculoskeletal: No acute or significant osseous findings. IMPRESSION: 1. No acute process identified as explanation for abdominal pain. 2. Mild splenomegaly, approximately 510 cc. 3. IUD has inferiorly migrated to cervix. Electronically Signed   By: Mitzi HansenLance  Furusawa-Stratton M.D.   On: 06/01/2018 21:05     ASSESSMENT AND PLAN:   33 year old female with history of headaches and asthma who presents to the emergency room due to fever.  1.  Sepsis: Patient presented with fever, tachycardia and leukocytosis.  Sepsis is due to cellulitis. Will up CBC in a.m. 2.  Left lower extremity cellulitis: Start penicillin Source of left lower extremity  cellulitis is her toes. Elevate leg Check Doppler rule out DVT  3.  Mild intermittent asthma without signs of exacerbation: Continue inhalers 4.  Hypokalemia: Replete  4.  Headache without neck stiffness or any other meningeal signs: Continue ibuprofen as needed     Management plans discussed with the patient and she is in agreement.  CODE STATUS: Full  TOTAL TIME TAKING CARE OF THIS PATIENT: 30 minutes.     POSSIBLE D/C tomorrow, DEPENDING ON CLINICAL CONDITION.   Coreena Rubalcava M.D on 06/02/2018 at 11:49 AM  Between 7am to 6pm - Pager - [phone] After 6pm go to [email or web address] - password [email or web address]  [facility] [place] [facility]  Office  [phone]  CC: Primary care physician; Doreene Nestlark, Katherine K, NP  Note: This dictation was prepared with Dragon dictation along with smaller phrase technology. Any transcriptional errors that result from this process are unintentional.

## 2018-06-02 NOTE — Consult Note (Signed)
Pharmacy Antibiotic Note  Kristie Hendricks is a 33 y.o. female admitted on 06/01/2018 with cellulitis.  Pharmacy has been consulted for penicillin dosing.  Plan:  Based on Dr Camillia Herter orders we will give IV penicillin 5 million units q4h for today and tomorrow early am then begin oral penicillin 500mg  QID  Height: 5\' 7"  (170.2 cm) Weight: 293 lb 6.4 oz (133.1 kg) IBW/kg (Calculated) : 61.6  Temp (24hrs), Avg:101.9 F (38.8 C), Min:99.5 F (37.5 C), Max:103.2 F (39.6 C)  Recent Labs  Lab 06/01/18 1952 06/01/18 1957 06/02/18 0539  WBC 16.5*  --  18.0*  CREATININE 0.59  --  0.75  LATICACIDVEN  --  0.9  --     Estimated Creatinine Clearance: 145.1 mL/min (by C-G formula based on SCr of 0.75 mg/dL).    Allergies  Allergen Reactions  . Clindamycin Anaphylaxis  . Prednisone Other (See Comments) and Nausea And Vomiting    States any steroids pancreatitis pancreatitis  . Dexamethasone Nausea And Vomiting  . Metformin And Related Other (See Comments)    Severe diarrhea  . Other Nausea And Vomiting    Steroid that starts with a C  - causes Pancreatis   . Morphine Anxiety  . Povidone Iodine Hives and Rash    Antimicrobials this admission: penicillin 1/8 >>  vanc 1/7 >> 1/8 Cefepime 1/7 >> 1/8  Microbiology results: 1/7 BCx: pending 1/7 UCx: pending  1/8 MRSA PCR: negative  Thank you for allowing pharmacy to be a part of this patient's care.  Lowella Bandy, PharmD 06/02/2018 11:07 AM

## 2018-06-02 NOTE — Progress Notes (Signed)
Pharmacy Antibiotic Note  Kristie Hendricks is a 33 y.o. female admitted on 06/01/2018 with sepsis.  Pharmacy has been consulted for vancomycin and cefepime dosing.  Plan:  DW 133kg   Vd 87L kei 0.087 hr-1  T1/2 8 hours  Vancomycin 1250 mg IV Q 8 hrs. Goal AUC 400-550. Expected AUC: 501 SCr used: 0.8   Height: 5\' 7"  (170.2 cm) Weight: 294 lb 1.6 oz (133.4 kg) IBW/kg (Calculated) : 61.6  Temp (24hrs), Avg:102.3 F (39.1 C), Min:101.1 F (38.4 C), Max:103.2 F (39.6 C)  Recent Labs  Lab 06/01/18 1952 06/01/18 1957  WBC 16.5*  --   CREATININE 0.59  --   LATICACIDVEN  --  0.9    Estimated Creatinine Clearance: 145.2 mL/min (by C-G formula based on SCr of 0.59 mg/dL).    Allergies  Allergen Reactions  . Clindamycin Anaphylaxis  . Prednisone Other (See Comments) and Nausea And Vomiting    States any steroids pancreatitis pancreatitis  . Dexamethasone Nausea And Vomiting  . Metformin And Related Other (See Comments)    Severe diarrhea  . Other Nausea And Vomiting    Steroid that starts with a C  - causes Pancreatis   . Morphine Anxiety  . Povidone Iodine Hives and Rash    Antimicrobials this admission: Vancomycin, cefepime  >>    >>   Dose adjustments this admission:   Microbiology results: 1/7 BCx: pending 1/7 UCx: pending       1/7 UA: (-) Thank you for allowing pharmacy to be a part of this patient's care.  Nefertari Rebman S 06/02/2018 12:05 AM

## 2018-06-03 ENCOUNTER — Telehealth: Payer: Self-pay | Admitting: Behavioral Health

## 2018-06-03 DIAGNOSIS — Z888 Allergy status to other drugs, medicaments and biological substances status: Secondary | ICD-10-CM

## 2018-06-03 DIAGNOSIS — Z9884 Bariatric surgery status: Secondary | ICD-10-CM

## 2018-06-03 DIAGNOSIS — L03116 Cellulitis of left lower limb: Secondary | ICD-10-CM

## 2018-06-03 DIAGNOSIS — Z91048 Other nonmedicinal substance allergy status: Secondary | ICD-10-CM

## 2018-06-03 DIAGNOSIS — F319 Bipolar disorder, unspecified: Secondary | ICD-10-CM

## 2018-06-03 DIAGNOSIS — Z6841 Body Mass Index (BMI) 40.0 and over, adult: Secondary | ICD-10-CM

## 2018-06-03 DIAGNOSIS — Z885 Allergy status to narcotic agent status: Secondary | ICD-10-CM

## 2018-06-03 DIAGNOSIS — I890 Lymphedema, not elsewhere classified: Secondary | ICD-10-CM

## 2018-06-03 DIAGNOSIS — Z881 Allergy status to other antibiotic agents status: Secondary | ICD-10-CM

## 2018-06-03 LAB — COMPREHENSIVE METABOLIC PANEL

## 2018-06-03 LAB — BASIC METABOLIC PANEL
Anion gap: 3 — ABNORMAL LOW (ref 5–15)
BUN: 6 mg/dL (ref 6–20)
CO2: 24 mmol/L (ref 22–32)
Calcium: 8 mg/dL — ABNORMAL LOW (ref 8.9–10.3)
Chloride: 112 mmol/L — ABNORMAL HIGH (ref 98–111)
Creatinine, Ser: 0.6 mg/dL (ref 0.44–1.00)
GFR calc Af Amer: >60 mL/min (ref >60)
GFR calc non Af Amer: >60 mL/min (ref >60)
Glucose, Bld: 98 mg/dL (ref 70–99)
POTASSIUM: 3.5 mmol/L (ref 3.5–5.1)
Sodium: 139 mmol/L (ref 135–145)

## 2018-06-03 LAB — URINE CULTURE
MICRO NUMBER:: 21890
SPECIMEN QUALITY:: ADEQUATE

## 2018-06-03 LAB — CBC
HCT: 32.2 % — ABNORMAL LOW (ref 36.0–46.0)
Hemoglobin: 10.6 g/dL — ABNORMAL LOW (ref 12.0–15.0)
MCH: 28.6 pg (ref 26.0–34.0)
MCHC: 32.9 g/dL (ref 30.0–36.0)
MCV: 87 fL (ref 80.0–100.0)
Platelets: 218 10*3/uL (ref 150–400)
RBC: 3.7 MIL/uL — ABNORMAL LOW (ref 3.87–5.11)
RDW: 13.4 % (ref 11.5–15.5)
WBC: 7.8 10*3/uL (ref 4.0–10.5)
nRBC: 0 % (ref 0.0–0.2)

## 2018-06-03 LAB — HIV ANTIBODY (ROUTINE TESTING W REFLEX): HIV Screen 4th Generation wRfx: NONREACTIVE

## 2018-06-03 LAB — CBC WITH DIFFERENTIAL/PLATELET

## 2018-06-03 MED ORDER — PENICILLIN G POTASSIUM 20000000 UNITS IJ SOLR
4.0000 10*6.[IU] | INTRAVENOUS | Status: DC
  Administered 2018-06-03 – 2018-06-04 (×5): 4 10*6.[IU] via INTRAVENOUS
  Filled 2018-06-03 (×10): qty 4

## 2018-06-03 MED ORDER — SODIUM CHLORIDE 0.9 % IV SOLN
5.0000 10*6.[IU] | INTRAVENOUS | Status: DC
  Administered 2018-06-03: 5 10*6.[IU] via INTRAVENOUS
  Filled 2018-06-03 (×3): qty 5

## 2018-06-03 NOTE — Progress Notes (Signed)
[facility]   PATIENT NAME: Kristie Hendricks    MR#:  [id number]  DATE OF BIRTH:  Jul 12, 1985  SUBJECTIVE:   Patient presented with fever.  It appears that she has cellulitis.  She has history of recurrent cellulitis.  She reports that penicillin is the only antibiotic that works for her.  Patient is reporting headache today during my examination, 6-7 out of 10 she claims that has requested the staff to give Advil but she was given Tylenol which is not helping.  She has endorsed that she did not get pillows to elevate her leg though she was asking multiple times and even ice packs were not given by the medical staff.  She is concerned about her left leg which is still hurting though her redness is almost gone.  Refused ID consult and claims only penicillin works for her cellulitis she prefers going home if we can make her an appointment with her primary ID Dr. [facility]van [facility] at [facility]   REVIEW OF SYSTEMS:    Review of Systems  Constitutional: Positive for fever, no chills weight loss HENT: Negative for ear pain, nosebleeds, congestion, facial swelling, rhinorrhea, neck pain, neck stiffness and ear discharge.  Reporting headache and states Tylenol does not help and was in tears Respiratory: Negative for cough, shortness of breath, wheezing  Cardiovascular: Negative for chest pain, palpitations and leg swelling.  Gastrointestinal: Negative for heartburn, abdominal pain, vomiting, diarrhea or consitpation Genitourinary: Negative for dysuria, urgency, frequency, hematuria Musculoskeletal: Reporting severe left leg pain but redness is improving negative for back pain or joint pain Neurological: Negative for dizziness, seizures, syncope, focal weakness,  numbness and headaches.  Hematological: Does not bruise/bleed easily.  Psychiatric/Behavioral: Negative for hallucinations, confusion, dysphoric mood  Skin: Left lower extremity cellulitis  Tolerating Diet:  yes      DRUG ALLERGIES:   Allergies  Allergen Reactions  . Clindamycin Anaphylaxis  . Prednisone Other (See Comments) and Nausea And Vomiting    States any steroids pancreatitis pancreatitis  . Dexamethasone Nausea And Vomiting  . Metformin And Related Other (See Comments)    Severe diarrhea  . Other Nausea And Vomiting    Steroid that starts with a C  - causes Pancreatis   . Morphine Anxiety  . Povidone Iodine Hives and Rash    VITALS:  Blood pressure (!) 153/97, pulse 79, temperature 98.3 F (36.8 C), temperature source Oral, resp. rate 16, height 5\' 7"  (1.702 m), weight 133.1 kg, SpO2 100 %.  PHYSICAL EXAMINATION:  Constitutional: Appears well-developed and well-nourished. No distress. HENT: Normocephalic. Marland Kitchen Oropharynx is clear and moist.  Eyes: Conjunctivae and EOM are normal. PERRLA, no scleral icterus.  Neck: Normal ROM. Neck supple. No JVD. No tracheal deviation. CVS: RRR, S1/S2 +, no murmurs, no gallops, no carotid bruit.  Pulmonary: Effort and breath sounds normal, no stridor, rhonchi, wheezes, rales.  Abdominal: Soft. BS +,  no distension, tenderness, rebound or guarding.  Musculoskeletal: Normal range of motion. No edema and no tenderness.  Neuro: Alert. CN 2-12 grossly intact. No focal deficits. Skin: Left lower extremity with very minimal edema and erythema, no discharge but tenderness is out of proportion even with minimal touch  psychiatric: Irritable mood and affect.      LABORATORY PANEL:   CBC Recent Labs  Lab 06/03/18 0602  WBC 7.8  HGB 10.6*  HCT 32.2*  PLT 218   ------------------------------------------------------------------------------------------------------------------  Chemistries  Recent Labs  Lab 06/01/18 1952  06/03/18 0602  NA 136   < > 139  K 3.5   < > 3.5  CL 103   < > 112*  CO2 23   < > 24  GLUCOSE 119*   < > 98  BUN 13   < > 6  CREATININE 0.59   < > 0.60  CALCIUM 8.8*   < > 8.0*  AST 23  --   --   ALT 26  --    --   ALKPHOS 103  --   --   BILITOT 1.9*  --   --    < > = values in this interval not displayed.   ------------------------------------------------------------------------------------------------------------------  Cardiac Enzymes No results for input(s): TROPONINI in the last 168 hours. ------------------------------------------------------------------------------------------------------------------  RADIOLOGY:  Dg Chest 2 View  Result Date: 06/01/2018 CLINICAL DATA:  Fever EXAM: CHEST - 2 VIEW COMPARISON:  None. FINDINGS: The heart size and mediastinal contours are within normal limits. Both lungs are clear. The visualized skeletal structures are unremarkable. IMPRESSION: No active cardiopulmonary disease. Electronically Signed   By: Jasmine Pang M.D.   On: 06/01/2018 23:11   Ct Abdomen Pelvis W Contrast  Result Date: 06/01/2018 CLINICAL DATA:  33 y/o  F; abdominal pain, back pain, and fever. EXAM: CT ABDOMEN AND PELVIS WITH CONTRAST TECHNIQUE: Multidetector CT imaging of the abdomen and pelvis was performed using the standard protocol following bolus administration of intravenous contrast. CONTRAST:  ISOVUE-300 IOPAMIDOL (ISOVUE-300) INJECTION 61% COMPARISON:  None. FINDINGS: Lower chest: No acute abnormality. Hepatobiliary: No focal liver abnormality is seen. Status post cholecystectomy. No biliary dilatation. Pancreas: Unremarkable. No pancreatic ductal dilatation or surrounding inflammatory changes. Spleen: Left inferior spleen cyst measuring 21 mm, likely benign. Spleen measures 11.5 x 7.7 x 11.1 cm (volume = 510 cm^3). Adrenals/Urinary Tract: Adrenal glands are unremarkable. Kidneys are normal, without renal calculi, focal lesion, or hydronephrosis. Bladder is unremarkable. 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 Vascular/Lymphatic: No significant vascular findings are present. No enlarged abdominal or pelvic lymph nodes. Reproductive:  Uterus and bilateral adnexa are unremarkable. IUD has inferiorly migrated to the cervix. Other: No abdominal wall hernia or abnormality. No abdominopelvic ascites. Musculoskeletal: No acute or significant osseous findings. IMPRESSION: 1. No acute process identified as explanation for abdominal pain. 2. Mild splenomegaly, approximately 510 cc. 3. IUD has inferiorly migrated to cervix. Electronically Signed   By: Mitzi Hansen M.D.   On: 06/01/2018 21:05   US Venous Img Lower Unilateral Left  Result Date: 06/02/2018 CLINICAL DATA:  Lymphedema for 5 years EXAM: LEFT LOWER EXTREMITY VENOUS DUPLEX ULTRASOUND TECHNIQUE: Doppler venous assessment of the left lower extremity deep venous system was performed, including characterization of spectral flow, compressibility, and phasicity. COMPARISON:  None. FINDINGS: There is complete compressibility of the common femoral, femoral, and popliteal veins. Doppler analysis demonstrates respiratory phasicity and augmentation of flow with calf compression. No obvious superficial vein or calf vein thrombosis. A mildly enlarged left inguinal lymph node is present. Short axis diameter is 1.0 cm. IMPRESSION: No evidence of left lower extremity DVT. Mildly enlarged left inguinal lymph node. This could reflect the patient's known lymphedema. Underlying malignancy or lymphoproliferative disorder is not excluded. Consider follow-up ultrasound to ensure resolution of this finding. Electronically Signed   By: Jolaine Click M.D.   On: 06/02/2018 12:18     ASSESSMENT AND PLAN:   33 year old female with history of headaches and asthma who presents to the emergency room due to fever.  1.  Sepsis:  Patient presented with fever, tachycardia and leukocytosis.  Sepsis is due to cellulitis. Clinically looking much better but reporting excruciating pain which is out of proportion  Leukocytosis resolved WBC 18.0-7.8 Afebrile and no fever in the past 24 hours . 2.  Left lower  extremity cellulitis:  Patient was initially treated with cefepime and vancomycin and antibiotics were changed to Pen-Vee K yesterday, continue penicillin as patient reports only penicillin works Source of left lower extremity cellulitis is her toes. Elevate leg with 2 pillows and ice packs Left lower extremity Dopplers negative for DVT Pain management as needed with ibuprofen 600 mg every 6 hours as needed as patient is reporting Tylenol is not helping that ibuprofen is helping if given on time Patient refused ID consult IV antibiotics at this point and prefers going home if we can help her to make an appointment [facility] today or tomorrow  3.  Mild intermittent asthma without signs of exacerbation: Continue inhalers   .  Hypokalemia: Repleted, potassium at 3.5  4.  Headache without neck stiffness or any other meningeal signs: Continue ibuprofen as needed   RN Waynetta SandyBeth was present during my conversation with the patient  Management plans discussed with the patient and she is in agreement.   Addendum: 2C  floor director notified that patient is refusing to see me  Patient will be assigned to Dr. Katheren ShamsSalary , Dr. Katheren ShamsSalary notified and he is in agreement  CODE STATUS: Full  TOTAL TIME TAKING CARE OF THIS PATIENT: 38 minutes.     POSSIBLE D/C tomorrow, DEPENDING ON CLINICAL CONDITION.   Ramonita LabAruna Eastin Swing M.D on 06/03/2018 at 2:58 PM  Between 7am to 6pm - Pager - [phone]  After 6pm go to [email or web address] - password [email or web address]  [facility]  Office  [phone]  CC: Primary care physician; Doreene Nestlark, Katherine K, NP  Note: This dictation was prepared with Dragon dictation along with smaller phrase technology. Any transcriptional errors that result from this process are unintentional.

## 2018-06-03 NOTE — Telephone Encounter (Signed)
Thanks

## 2018-06-03 NOTE — Telephone Encounter (Signed)
Called Kristie Hendricks back, informed her Dr. Orvan Falconer reviewed notes and agrees with current plan by her Dr.'s at [facility].  Patient states she does not feel Erythema has improved.  Per notes she needs to follow up with Dr. Orvan Falconer.  Informed her per notes she refused to see ID doctor at [facility].  Patient states that was not the case.  Scheduled her an [facility] appointment with Dr. Orvan Falconer 06/22/2018.   Kristie Slim RN

## 2018-06-03 NOTE — Telephone Encounter (Signed)
I have reviewed the hospitalist notes that show that she has recurrent lower extremity cellulitis.  She was started on IV penicillin (at her request) yesterday.  The note today indicates that the erythema has improved.  The note also indicates that she "refused ID consult" there.  Please let her know that she is on appropriate therapy.

## 2018-06-03 NOTE — Consult Note (Signed)
NAME: Kristie Hendricks  DOB: 22-Jan-1986  MRN: [id number]  Date/Time: 06/03/2018 5:50 PM  Dr. Amado Coe Subjective:  REASON FOR CONSULT: Cellulitis ? Kristie Hendricks is a 33 y.o. female with a history of recurrent cellulitis of the left leg , bipolar disorder, morbid obesity, s/p Gastric bypass presents with fever and leg pain and back pain on the left side on 06/01/2018.  As per patient she has had cellulitis since the age of 52.  She was told that she has lymphedema and when she was in [place] until 2 years ago she has had frequent episodes.  At one time she was on penicillin prophylaxis getting Bicillin every 3 weeks.  She moved to [place] 2 years ago and has not had exacerbation until this year.  In October last year she had a small episode of mild pain and redness on the left leg.  She has been followed by Dr. Orvan Falconer but or CAD.  She is not on any prophylaxis currently.  She states that on Tuesday when she got up she had a fever and also was having pain in her left groin.  She also had pain on the back on the left side.  She came to the ED and had a temperature of 104.  The left leg was not red.  She was started on vancomycin and cefepime.  24 hours later she noted redness in the left leg.  she states Penicillin was one antibiotic which worked for her in the past .Vanco and cefepime was changed to IV penicillin yesterday.Her wbc has declined to 8.  Patient states after her bypass she lost 140 pounds since moving to [place] 2 years ago she is gained at least 80 pounds.  She also works at 2 jobs and stands most of the day which makes his legs swell.  She has not been using a compression stocking because she says is too sensitive. Past Medical History:  Diagnosis Date  . Anxiety   . Asthma   . Bipolar disorder (HCC)   . Chickenpox   . Depression   . Frequent headaches   . Hypertension   . Migraines   . Pancreatitis     Past Surgical History:  Procedure Laterality Date  . DG GALL BLADDER  2003   . GASTRIC BYPASS  2016  . KNEE ARTHROSCOPY Left 08/26/2017  Social history Lives on her own Non-smoker Occasional alcohol Family History  Problem Relation Age of Onset  . Asthma Mother   . COPD Mother   . Hyperlipidemia Mother   . Hypertension Mother   . Stroke Mother   . Arthritis Father   . Diabetes Father   . Heart disease Father   . Hyperlipidemia Father   . Hypertension Father   . Kidney disease Father   . Asthma Sister   . Depression Sister   . Asthma Brother   . Depression Brother   . Uterine cancer Maternal Grandmother 79   Allergies  Allergen Reactions  . Clindamycin Anaphylaxis  . Prednisone Other (See Comments) and Nausea And Vomiting    States any steroids pancreatitis   . Metformin And Related Other (See Comments)    Severe diarrhea  . Morphine Anxiety  . Povidone Iodine Hives and Rash  ? Current Facility-Administered Medications  Medication Dose Route Frequency Provider Last Rate Last Dose  . 0.9 %  sodium chloride infusion   Intravenous PRN Adrian Saran, MD   Stopped at 06/03/18 0817  . acetaminophen (TYLENOL) tablet 650 mg  650 mg Oral Q6H PRN Oralia Manis, MD   650 mg at 06/03/18 1208   Or  . acetaminophen (TYLENOL) suppository 650 mg  650 mg Rectal Q6H PRN Oralia Manis, MD      . enoxaparin (LOVENOX) injection 40 mg  40 mg Subcutaneous BID Oralia Manis, MD   40 mg at 06/03/18 0947  . ibuprofen (ADVIL,MOTRIN) tablet 600 mg  600 mg Oral Q6H PRN Adrian Saran, MD   600 mg at 06/03/18 1251  . ondansetron (ZOFRAN) injection 4 mg  4 mg Intravenous Once PRN Oralia Manis, MD      . ondansetron [facility]) tablet 4 mg  4 mg Oral Q6H PRN Oralia Manis, MD       Or  . ondansetron [facility]) injection 4 mg  4 mg Intravenous Q6H PRN Oralia Manis, MD      . oxyCODONE (Oxy IR/ROXICODONE) immediate release tablet 5 mg  5 mg Oral Q6H PRN Oralia Manis, MD   5 mg at 06/03/18 0615  . penicillin G potassium 5 Million Units in sodium chloride 0.9 % 250 mL IVPB  5 Million  Units Intravenous Q4H Gouru, Aruna, MD         Abtx:  Anti-infectives (From admission, onward)   Start     Dose/Rate Route Frequency Ordered Stop   06/03/18 1900  penicillin G potassium 5 Million Units in sodium chloride 0.9 % 250 mL IVPB     5 Million Units 250 mL/hr over 60 Minutes Intravenous Every 4 hours 06/03/18 1704 06/04/18 0659   06/03/18 1000  penicillin v potassium (VEETID) tablet 500 mg  Status:  Discontinued     500 mg Oral 4 times daily 06/02/18 1111 06/03/18 1704   06/02/18 1200  penicillin G potassium 5 Million Units in sodium chloride 0.9 % 250 mL IVPB     5 Million Units 250 mL/hr over 60 Minutes Intravenous Every 4 hours 06/02/18 1111 06/03/18 0730   06/02/18 0800  vancomycin (VANCOCIN) 1,250 mg in sodium chloride 0.9 % 250 mL IVPB  Status:  Discontinued     1,250 mg 166.7 mL/hr over 90 Minutes Intravenous Every 8 hours 06/02/18 0004 06/02/18 1111   06/02/18 0600  ceFEPIme (MAXIPIME) 2 g in sodium chloride 0.9 % 100 mL IVPB  Status:  Discontinued     2 g 200 mL/hr over 30 Minutes Intravenous Every 8 hours 06/02/18 0001 06/02/18 1111   06/01/18 2300  vancomycin (VANCOCIN) 2,000 mg in sodium chloride 0.9 % 500 mL IVPB     2,000 mg 250 mL/hr over 120 Minutes Intravenous  Once 06/01/18 2253 06/02/18 0150   06/01/18 2130  cefTRIAXone (ROCEPHIN) 1 g in sodium chloride 0.9 % 100 mL IVPB     1 g 200 mL/hr over 30 Minutes Intravenous  Once 06/01/18 2125 06/01/18 2330      REVIEW OF SYSTEMS:  Const: Fever, negative chills, negative weight loss Eyes: negative diplopia or visual changes, negative eye pain ENT: negative coryza, negative sore throat Resp: negative cough, hemoptysis, dyspnea Cards: negative for chest pain, palpitations, lower extremity edema GU: negative for frequency, dysuria and hematuria GI: Negative for abdominal pain, diarrhea, bleeding, constipation Skin:  rash  Heme: negative for easy bruising and gum/nose bleeding MS:  myalgias, back pain and muscle  weakness Neurolo: headaches, no dizziness, vertigo, memory problems  Psych: anxiety, depression  Endocrine: No polyuria or polydipsia Allergy/Immunology-medication allergies as above Objective:  VITALS:  BP (!) 153/97 (BP Location: Left Arm)   Pulse 79  Temp 98.3 F (36.8 C) (Oral)   Resp 16   Ht 5\' 7"  (1.702 m)   Wt 133.1 kg   SpO2 100%   BMI 45.95 kg/m  PHYSICAL EXAM:  General: Alert, cooperative, no distress, appears stated age.  Head: Normocephalic, without obvious abnormality, atraumatic. Eyes: Conjunctivae clear, anicteric sclerae. Pupils are equal ENT Nares normal. No drainage or sinus tenderness. Lips, mucosa, and tongue normal. No Thrush Neck: Supple, symmetrical, no adenopathy, thyroid: non tender no carotid bruit and no JVD. Back: No CVA tenderness. Lungs: Clear to auscultation bilaterally. No Wheezing or Rhonchi. No rales. Heart: Regular rate and rhythm, no murmur, rub or gallop. Abdomen: Soft, non-tender,not distended. Bowel sounds normal. No masses Extremities: Left groin tender lymph nodes Left leg below the knee there is erythema and tenderness.  It is soft       atraumatic, no cyanosis. No edema. No clubbing Interdigital cleft between the fourth and the fifth toe has maceration Skin: No rashes or lesions. Or bruising Lymph: Inguinal adenopathy on the left Neurologic: Grossly non-focal Pertinent Labs Lab Results CBC    Component Value Date/Time   WBC 7.8 06/03/2018 0602   RBC 3.70 (L) 06/03/2018 0602   HGB 10.6 (L) 06/03/2018 0602   HCT 32.2 (L) 06/03/2018 0602   PLT 218 06/03/2018 0602   MCV 87.0 06/03/2018 0602   MCH 28.6 06/03/2018 0602   MCHC 32.9 06/03/2018 0602   RDW 13.4 06/03/2018 0602   LYMPHSABS 0.5 (L) 06/01/2018 1952   MONOABS 0.8 06/01/2018 1952   EOSABS 0.0 06/01/2018 1952   BASOSABS 0.1 06/01/2018 1952   CBC Latest Ref Rng & Units 06/03/2018 06/02/2018 06/01/2018  WBC 4.0 - 10.5 K/uL 7.8 18.0(H) 16.5(H)  Hemoglobin 12.0 - 15.0  g/dL 10.6(L) 12.8 12.5  Hematocrit 36.0 - 46.0 % 32.2(L) 38.6 37.1  Platelets 150 - 400 K/uL 218 280 284   CMP Latest Ref Rng & Units 06/03/2018 06/02/2018 06/01/2018  Glucose 70 - 99 mg/dL 98 161(W) 960(A)  BUN 6 - 20 mg/dL 6 9 13   Creatinine 0.44 - 1.00 mg/dL 5.40 9.81 1.91  Sodium 135 - 145 mmol/L 139 138 136  Potassium 3.5 - 5.1 mmol/L 3.5 3.4(L) 3.5  Chloride 98 - 111 mmol/L 112(H) 106 103  CO2 22 - 32 mmol/L 24 24 23   Calcium 8.9 - 10.3 mg/dL 8.0(L) 8.2(L) 8.8(L)  Total Protein 6.5 - 8.1 g/dL - - 7.4  Total Bilirubin 0.3 - 1.2 mg/dL - - 1.9(H)  Alkaline Phos 38 - 126 U/L - - 103  AST 15 - 41 U/L - - 23  ALT 0 - 44 U/L - - 26      Microbiology: Recent Results (from the past 240 hour(s))  Urine Culture     Status: None   Collection Time: 06/01/18  4:35 PM  Result Value Ref Range Status   MICRO NUMBER: [id number]  Final   SPECIMEN QUALITY: Adequate  Final   Sample Source URINE  Final   STATUS: FINAL  Final   Result:   Final    Three or more organisms present, each greater than 10,000 CFU/mL. May represent normal flora contamination from external genitalia. No further testing is required.  Urine Culture     Status: Abnormal   Collection Time: 06/01/18  7:52 PM  Result Value Ref Range Status   Specimen Description   Final    URINE, RANDOM Performed at [facility], [address], [place], [place] [address]    Special Requests  Final    NONE Performed at [facility], [address] [place], [place] [address]    Culture MULTIPLE SPECIES PRESENT, SUGGEST RECOLLECTION (A)  Final   Report Status 06/03/2018 FINAL  Final  Blood Culture (routine x 2)     Status: None (Preliminary result)   Collection Time: 06/01/18 10:14 PM  Result Value Ref Range Status   Specimen Description BLOOD BLOOD RIGHT HAND  Final   Special Requests   Final    BOTTLES DRAWN AEROBIC AND ANAEROBIC Blood Culture adequate volume   Culture   Final    NO GROWTH 2 DAYS Performed at  [facility], [address] [place], [place] [address]    Report Status PENDING  Incomplete  Blood Culture (routine x 2)     Status: None (Preliminary result)   Collection Time: 06/01/18 10:50 PM  Result Value Ref Range Status   Specimen Description BLOOD BLOOD RIGHT ARM  Final   Special Requests   Final    BOTTLES DRAWN AEROBIC AND ANAEROBIC Blood Culture adequate volume   Culture   Final    NO GROWTH 2 DAYS Performed at [facility], [address] [place], [place] [address]    Report Status PENDING  Incomplete  MRSA PCR Screening     Status: None   Collection Time: 06/02/18  8:27 AM  Result Value Ref Range Status   MRSA by PCR NEGATIVE NEGATIVE Final    Comment:        The GeneXpert MRSA Assay (FDA approved for NASAL specimens only), is one component of a comprehensive MRSA colonization surveillance program. It is not intended to diagnose MRSA infection nor to guide or monitor treatment for MRSA infections. Performed at [facility], [address] [place], [place] [address]    IMAGING RESULTS: ?Left leg Doppler no DVT   impression/Recommendation ?33 y.o. female with a history of recurrent cellulitis of the left leg , bipolar disorder, morbid obesity, s/p Gastric bypass presents with fever and leg pain and back pain on the left side on 06/01/2018.  As per patient she has had cellulitis since the age of 33.  She was told that she has lymphedema and when she was in [place] until 2 years ago she has had frequent episodes.  At one time she was on penicillin prophylaxis getting Bicillin every 3 weeks.  She moved to [place] 2 years ago and has not had exacerbation until this year.  In October last year she had a small episode of mild pain and redness on the left leg.  She has been followed by Dr. Orvan Falconerampbell but or CAD.  She is not on any prophylaxis currently.  She states that on Tuesday when she got up she had a fever and also was having pain  in her left groin.  She also had pain on the back on the left side.  She came to the ED and had a temperature of 104.  The left leg was not red.  She was started on vancomycin and cefepime.  24 hours later she noted redness in the left leg.  she states Penicillin was one antibiotic which worked for her in the past .Vanco and cefepime was changed to IV penicillin yesterday.Her wbc has declined to 8.  ?Cellulitis of the left leg.  This is recurrent as per patient.  Currently on penicillin IV and is responding to the treatment.  Fevers have resolved and WBC count is down to 7.8.  Continue penicillin for another 24 hours.  And then she can be switched to oral penicillin 500 mg every 6hrs for another week.  She will follow-up with Dr. Orvan Falconer as outpatient. She has  lymphedema and needs compression stocking.  She states  is is too painful to use the stocking.  May do cotton padding with Ace wrap instead.  ?History of gastric bypass surgery History of allergy to clindamycin.  States she has a feeling of her throat closing.  ___________________________________________________ Discussed with patient in detail Note:  This document was prepared using Conservation officer, historic buildings and may include unintentional dictation errors.

## 2018-06-03 NOTE — Telephone Encounter (Signed)
Patient called stating she is currently inpatient at [facility] with Cellulitis.  She states she requested an infectious disease consult however they state they did not need infectious disease "per patient".  Patient states she is concerned and would like for Dr. Orvan Falconer to be aware because he is familiar with her case.  Patient states she wanted to verify she was on the proper medication regimen.  Informed her Dr. Orvan Falconer would be made aware. Angeline Slim RN

## 2018-06-04 MED ORDER — PENICILLIN V POTASSIUM 500 MG PO TABS: 500.0000 mg | ORAL_TABLET | Freq: Four times a day (QID) | ORAL | 0 refills | Status: AC

## 2018-06-04 MED ORDER — IBUPROFEN 600 MG PO TABS: 600.0000 mg | ORAL_TABLET | Freq: Four times a day (QID) | ORAL | 0 refills | Status: DC | PRN

## 2018-06-04 MED ORDER — PENICILLIN V POTASSIUM 500 MG PO TABS
500.0000 mg | ORAL_TABLET | Freq: Four times a day (QID) | ORAL | Status: DC
  Filled 2018-06-04 (×2): qty 1

## 2018-06-04 NOTE — Discharge Summary (Signed)
[facility]   PATIENT NAME: Kristie ReinLisa Holford    MR#:  [id number]  DATE OF BIRTH:  08/02/1985  DATE OF ADMISSION:  06/01/2018 ADMITTING PHYSICIAN: Oralia Manisavid Willis, MD  DATE OF DISCHARGE: No discharge date for patient encounter.  PRIMARY CARE PHYSICIAN: Doreene Nestlark, Katherine K, NP    ADMISSION DIAGNOSIS:  Fever [R50.9] Sepsis, due to unspecified organism, unspecified whether acute organ dysfunction present (HCC) [A41.9]  DISCHARGE DIAGNOSIS:  Principal Problem:   SIRS (systemic inflammatory response syndrome) (HCC) Active Problems:   Bipolar disorder (HCC)   Hypertension   Dehydration   SECONDARY DIAGNOSIS:   Past Medical History:  Diagnosis Date  . Anxiety   . Asthma   . Bipolar disorder (HCC)   . Chickenpox   . Depression   . Frequent headaches   . Hypertension   . Migraines   . Pancreatitis     HOSPITAL COURSE:   33 year old female with history of headaches and asthma who presents to the emergency room due to fever.  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 [facility] disease provider did see patient while in house as well [place] infectious disease [place], treated with IV penicillin with great improvement, patient to be transition to p.o. to complete 7-day course per ID, pain treated with Tylenol/Motrin, recommended TED hose-patient refuses to wear due to discomfort, patient advised to Ace wrap when standing/ambulating  * Mild intermittent asthma without signs of exacerbation Stable Provided inhalers as needed  *Acute hypokalemia Repleted  *Acute headache Resolved  DISCHARGE CONDITIONS:   stable  CONSULTS OBTAINED:  Treatment Team:   Lynn Itoavishankar, Jayashree, MD  DRUG ALLERGIES:   Allergies  Allergen Reactions  . Clindamycin Anaphylaxis  . Prednisone Other (See Comments) and Nausea And Vomiting    States any steroids pancreatitis pancreatitis  . Dexamethasone Nausea And Vomiting  . Metformin And Related Other (See Comments)    Severe diarrhea  . Other Nausea And Vomiting    Steroid that starts with a C  - causes Pancreatis   . Morphine Anxiety  . Povidone Iodine Hives and Rash    DISCHARGE MEDICATIONS:   Allergies as of 06/04/2018      Reactions   Clindamycin Anaphylaxis   Prednisone Other (See Comments), Nausea And Vomiting   States any steroids pancreatitis pancreatitis   Dexamethasone Nausea And Vomiting   Metformin And Related Other (See Comments)   Severe diarrhea   Other Nausea And Vomiting   Steroid that starts with a C  - causes Pancreatis    Morphine Anxiety   Povidone Iodine Hives, Rash      Medication List    TAKE these medications   acetaminophen 325 MG tablet Commonly known as:  TYLENOL Take 1-2 tablets by mouth as needed.   albuterol 108 (90 Base) MCG/ACT inhaler Commonly known as:  PROVENTIL HFA;VENTOLIN HFA Inhale 2 puffs into the lungs every 4 (four) hours as needed for wheezing or shortness of breath.   Budesonide 90 MCG/ACT inhaler Inhale 2 puffs into the lungs 2 (two) times daily.   fexofenadine 180 MG tablet Commonly known as:  ALLEGRA Take 180 mg by mouth daily as needed.   ibuprofen 600 MG tablet Commonly known as:  ADVIL,MOTRIN Take 1 tablet (600 mg total) by mouth every 6 (six)  hours as needed for fever or moderate pain (or headache).   penicillin v potassium 500 MG tablet Commonly known as:  VEETID Take 1 tablet (500 mg total) by mouth every 6 (six) hours for 7 days.        DISCHARGE INSTRUCTIONS:    If you experience worsening of your admission symptoms, develop shortness of breath, life threatening emergency, suicidal or homicidal thoughts you must  seek medical attention immediately by calling 911 or calling your MD immediately  if symptoms less severe.  You Must read complete instructions/literature along with all the possible adverse reactions/side effects for all the Medicines you take and that have been prescribed to you. Take any new Medicines after you have completely understood and accept all the possible adverse reactions/side effects.   Please note  You were cared for by a hospitalist during your hospital stay. If you have any questions about your discharge medications or the care you received while you were in the hospital after you are discharged, you can call the unit and asked to speak with the hospitalist on call if the hospitalist that took care of you is not available. Once you are discharged, your primary care physician will handle any further medical issues. Please note that NO REFILLS for any discharge medications will be authorized once you are discharged, as it is imperative that you return to your primary care physician (or establish a relationship with a primary care physician if you do not have one) for your aftercare needs so that they can reassess your need for medications and monitor your lab values.    Today   CHIEF COMPLAINT:   Chief Complaint  Patient presents with  . Abdominal Pain  . Fever  . Back Pain    HISTORY OF PRESENT ILLNESS:  33 y.o. female who presents with chief complaint as above.  Patient states that 5 days ago she had several episodes of diarrhea.  These were self-limiting and occurred only that day.  She had no other symptoms.  4 days ago she had an episode of left costovertebral angle tenderness.  She states that she thought perhaps she had pulled a muscle.  This was also self-limiting, and resolved.  She states that she then was feeling okay until today when she developed a fever, recurrent left costovertebral angle tenderness, some groin pain, and then diffuse malaise and nausea with  vomiting x1.  Here in the ED she was found to be persistently febrile despite antipyretics that she took at home, Tylenol and ibuprofen.  She is tachycardic.  She has an elevated white blood cell count.  Work-up is otherwise largely within normal limits.  She meets sepsis criteria, except that her source is unclear at this time.  Work-up has included influenza test which was negative, urinalysis which showed some dehydration though was not indicative of UTI.  CT abdomen and pelvis did not elucidate a source of infection.  Hospitalist were called for admission and further evaluation and treatment   VITAL SIGNS:  Blood pressure (!) 142/88, pulse (!) 102, temperature 99.4 F (37.4 C), temperature source Oral, resp. rate 19, height 5\' 7"  (1.702 m), weight 133.1 kg, SpO2 100 %.  I/O:    Intake/Output Summary (Last 24 hours) at 06/04/2018 1016 Last data filed at 06/04/2018 0500 Gross per 24 hour  Intake 1414.25 ml  Output 3450 ml  Net -2035.75 ml    PHYSICAL EXAMINATION:  GENERAL:  33 y.o.-year-old patient lying in the bed with no acute distress.  EYES: Pupils equal, round, reactive to light and accommodation. No scleral icterus. Extraocular muscles intact.  HEENT: Head atraumatic, normocephalic. Oropharynx and nasopharynx clear.  NECK:  Supple, no jugular venous distention. No thyroid enlargement, no tenderness.  LUNGS: Normal breath sounds bilaterally, no wheezing, rales,rhonchi or crepitation. No use of accessory muscles of respiration.  CARDIOVASCULAR: S1, S2 normal. No murmurs, rubs, or gallops.  ABDOMEN: Soft, non-tender, non-distended. Bowel sounds present. No organomegaly or mass.  EXTREMITIES: No pedal edema, cyanosis, or clubbing.  NEUROLOGIC: Cranial nerves II through XII are intact. Muscle strength 5/5 in all extremities. Sensation intact. Gait not checked.  PSYCHIATRIC: The patient is alert and oriented x 3.  SKIN: No obvious rash, lesion, or ulcer.   DATA REVIEW:   CBC Recent  Labs  Lab 06/03/18 0602  WBC 7.8  HGB 10.6*  HCT 32.2*  PLT 218    Chemistries  Recent Labs  Lab 06/01/18 1952  06/03/18 0602  NA 136   < > 139  K 3.5   < > 3.5  CL 103   < > 112*  CO2 23   < > 24  GLUCOSE 119*   < > 98  BUN 13   < > 6  CREATININE 0.59   < > 0.60  CALCIUM 8.8*   < > 8.0*  AST 23  --   --   ALT 26  --   --   ALKPHOS 103  --   --   BILITOT 1.9*  --   --    < > = values in this interval not displayed.    Cardiac Enzymes No results for input(s): TROPONINI in the last 168 hours.  Microbiology Results  Results for orders placed or performed during the hospital encounter of 06/01/18  Urine Culture     Status: Abnormal   Collection Time: 06/01/18  7:52 PM  Result Value Ref Range Status   Specimen Description   Final    URINE, RANDOM Performed at [facility], [address] [place] [address], [place], [place] [address]    Special Requests   Final    NONE Performed at [facility], [address] [place], [place] [address]    Culture MULTIPLE SPECIES PRESENT, SUGGEST RECOLLECTION (A)  Final   Report Status 06/03/2018 FINAL  Final  Blood Culture (routine x 2)     Status: None (Preliminary result)   Collection Time: 06/01/18 10:14 PM  Result Value Ref Range Status   Specimen Description BLOOD BLOOD RIGHT HAND  Final   Special Requests   Final    BOTTLES DRAWN AEROBIC AND ANAEROBIC Blood Culture adequate volume   Culture   Final    NO GROWTH 3 DAYS Performed at [facility], [address] [place], [place] [address]    Report Status PENDING  Incomplete  Blood Culture (routine x 2)     Status: None (Preliminary result)   Collection Time: 06/01/18 10:50 PM  Result Value Ref Range Status   Specimen Description BLOOD BLOOD RIGHT ARM  Final   Special Requests   Final    BOTTLES DRAWN AEROBIC AND ANAEROBIC Blood Culture adequate volume   Culture   Final    NO GROWTH 3 DAYS Performed at [facility], [address],  [place], [place] [address]    Report Status PENDING  Incomplete  MRSA PCR Screening     Status: None   Collection Time: 06/02/18  8:27 AM  Result Value Ref Range Status  MRSA by PCR NEGATIVE NEGATIVE Final    Comment:        The GeneXpert MRSA Assay (FDA approved for NASAL specimens only), is one component of a comprehensive MRSA colonization surveillance program. It is not intended to diagnose MRSA infection nor to guide or monitor treatment for MRSA infections. Performed at [facility], [address] [place], [place] [address]     RADIOLOGY:  US Venous Img Lower Unilateral Left  Result Date: 06/02/2018 CLINICAL DATA:  Lymphedema for 5 years EXAM: LEFT LOWER EXTREMITY VENOUS DUPLEX ULTRASOUND TECHNIQUE: Doppler venous assessment of the left lower extremity deep venous system was performed, including characterization of spectral flow, compressibility, and phasicity. COMPARISON:  None. FINDINGS: There is complete compressibility of the common femoral, femoral, and popliteal veins. Doppler analysis demonstrates respiratory phasicity and augmentation of flow with calf compression. No obvious superficial vein or calf vein thrombosis. A mildly enlarged left inguinal lymph node is present. Short axis diameter is 1.0 cm. IMPRESSION: No evidence of left lower extremity DVT. Mildly enlarged left inguinal lymph node. This could reflect the patient's known lymphedema. Underlying malignancy or lymphoproliferative disorder is not excluded. Consider follow-up ultrasound to ensure resolution of this finding. Electronically Signed   By: Jolaine Click M.D.   On: 06/02/2018 12:18    EKG:  No orders found for this or any previous visit.    Management plans discussed with the patient, family and they are in agreement.  CODE STATUS:     Code Status Orders  (From admission, onward)         Start     Ordered   06/01/18 2319  Full code  Continuous     06/01/18 2318        Code Status  History    This patient has a current code status but no historical code status.      TOTAL TIME TAKING CARE OF THIS PATIENT: 35 minutes.    Evelena Asa Salary M.D on 06/04/2018 at 10:16 AM  Between 7am to 6pm - Pager - [phone]  After 6pm go to [email or web address] - password [email or web address]  [facility]  Office  [phone]  CC: Primary care physician; Doreene Nest, NP   Note: This dictation was prepared with Dragon dictation along with smaller phrase technology. Any transcriptional errors that result from this process are unintentional.

## 2018-06-04 NOTE — Progress Notes (Signed)
ID Pt doing better Left leg swelling and erythema better Being discharged today  Follow up with Dr.Campbell

## 2018-06-06 LAB — CULTURE, BLOOD (ROUTINE X 2)
Culture: NO GROWTH
Culture: NO GROWTH
Special Requests: ADEQUATE
Special Requests: ADEQUATE

## 2018-06-07 ENCOUNTER — Ambulatory Visit: Payer: Self-pay | Admitting: Family Medicine

## 2018-06-07 ENCOUNTER — Ambulatory Visit (INDEPENDENT_AMBULATORY_CARE_PROVIDER_SITE_OTHER): Payer: BLUE CROSS/BLUE SHIELD | Admitting: Internal Medicine

## 2018-06-07 ENCOUNTER — Encounter: Payer: Self-pay | Admitting: Internal Medicine

## 2018-06-07 DIAGNOSIS — L02416 Cutaneous abscess of left lower limb: Secondary | ICD-10-CM | POA: Diagnosis not present

## 2018-06-07 DIAGNOSIS — L03116 Cellulitis of left lower limb: Secondary | ICD-10-CM | POA: Diagnosis not present

## 2018-06-07 DIAGNOSIS — L03119 Cellulitis of unspecified part of limb: Secondary | ICD-10-CM

## 2018-06-07 NOTE — Assessment & Plan Note (Deleted)
Improved.  Can finish her penicillin and stop.   Has follow up with her primary ID provider later this month Note to return to work 1/16 provided 

## 2018-06-07 NOTE — Assessment & Plan Note (Signed)
Will follow up with Dr. Orvan Falconer for continued care, consideration of restarting IM penicillin.

## 2018-06-07 NOTE — Progress Notes (Signed)
   Subjective:    Patient ID: Flossie Haston, female    DOB: 07-11-1985, 33 y.o.   MRN: [id number]  HPI Here for follow up of her recent hospitalization for cellulitis. She has a history of recurrent cellulitis for many years and has been on IM penicillin every 3 weeks by Dr. Orvan Falconer, last dose in May 2019.  She missed recent follow up with him. She was hospitalized 1/7-1/10 and treated with IV penicillin and discharged on oral penicillin. Saw Dr. Rivka Safer in the hospital.  Feels better.  Still pain in leg but improved.  Still some groin pain which is typical. Had some very mild hyperkalemia that resolved.    Review of Systems  Constitutional: Negative for chills and fever.  Gastrointestinal: Negative for diarrhea.  Skin: Negative for rash.       Objective:   Physical Exam Constitutional:      Appearance: Normal appearance.  Eyes:     General: No scleral icterus. Musculoskeletal:     Comments: Left leg with some blanching faint erythema without any warmth, no significant tenderness, much improved compared to picture in Epic from hospital  Neurological:     Mental Status: She is alert.           Assessment & Plan:

## 2018-06-07 NOTE — Assessment & Plan Note (Signed)
Improved.  Can finish her penicillin and stop.   Has follow up with her primary ID provider later this month Note to return to work 1/16 provided

## 2018-06-08 ENCOUNTER — Ambulatory Visit (INDEPENDENT_AMBULATORY_CARE_PROVIDER_SITE_OTHER): Payer: BLUE CROSS/BLUE SHIELD | Admitting: Primary Care

## 2018-06-08 ENCOUNTER — Encounter: Payer: Self-pay | Admitting: Primary Care

## 2018-06-08 VITALS — BP 102/66 | HR 61 | Temp 98.3°F | Wt 293.2 lb

## 2018-06-08 DIAGNOSIS — I890 Lymphedema, not elsewhere classified: Secondary | ICD-10-CM | POA: Diagnosis not present

## 2018-06-08 DIAGNOSIS — L03119 Cellulitis of unspecified part of limb: Secondary | ICD-10-CM

## 2018-06-08 DIAGNOSIS — Z09 Encounter for follow-up examination after completed treatment for conditions other than malignant neoplasm: Secondary | ICD-10-CM | POA: Diagnosis not present

## 2018-06-08 DIAGNOSIS — B379 Candidiasis, unspecified: Secondary | ICD-10-CM

## 2018-06-08 DIAGNOSIS — F319 Bipolar disorder, unspecified: Secondary | ICD-10-CM

## 2018-06-08 DIAGNOSIS — T3695XA Adverse effect of unspecified systemic antibiotic, initial encounter: Secondary | ICD-10-CM

## 2018-06-08 MED ORDER — FLUCONAZOLE 150 MG PO TABS: 150.0000 mg | ORAL_TABLET | Freq: Once | ORAL | 0 refills | Status: AC

## 2018-06-08 NOTE — Assessment & Plan Note (Signed)
Referral placed for psychiatry evaluation per patient request.

## 2018-06-08 NOTE — Progress Notes (Signed)
Subjective:    Patient ID: Kristie ReinLisa Prentiss, female    DOB: 04/19/1986, 33 y.o.   MRN: [id number]  HPI  Kristie Hendricks is a 33 year old female with a history of lymphedema and recurrent cellulitis who presents today for hospital follow up.  She presented to our office on 06/01/18 with a chief complaint of fevers and abdominal pain. Her abdominal exam was tender to the bilateral lower abdomen, also with left CVA tenderness. Given her fevers coupled with her exam she was sent to the ED for evaluation.  During her stay in the ED her fevers continued to rise despite treatment with antipyretics. CT abdomen/pelvis negative for acute process. She had no signs of cellulitis. Given the unknown cause for her symptoms she was admitted for further work up.  During her hospital stay she was treated with IV fluids and antibiotics. The following day she was developing signs of lower extremity cellulitis so her regimen was changed to IV PCN as this has always been effective. Her fevers and labs resolved, cellulitis began to improve so she was discharged home on 06/04/18. She was provided with a seven day course of oral penicillin and recommendations for TED hose. She refused TED hose and was then instructed to wrap her legs with ACE bandages.   Since her discharge home she's feeling better. Her erythema continues to improve, mild erythema present. She's followed up with infectious disease yesterday, no changes in the plan of care. She's compliant to her oral penicillin every 6 hours. Since starting the penicillin she's noticed vaginal discharge. She has a history of recurrent vaginal yeast infections after taking antibiotics, typically treated with fluconazole.   She cannot tolerate compression stockings longer than one hour due to pain. Overall her swelling has been manageable for most of the year with the exception of the last several months. She had been working on her feet overtime during the holidays. She was once  treated by [place] clinic [place], has not been connected since moving.  Her recent hospitalization and recurring cellulitis caused symptoms of depression and anxiety. She carries a diagnosis of bipolar disorder and has been seeing a therapist. She would like to establish with a local psychiatrist.  BP Readings from Last 3 Encounters:  06/08/18 102/66  06/07/18 135/87  06/04/18 134/78     Review of Systems  Constitutional: Negative for fever.  Respiratory: Negative for shortness of breath.   Cardiovascular: Negative for chest pain.  Musculoskeletal: Negative for back pain.  Skin: Positive for color change.       Past Medical History:  Diagnosis Date  . Anxiety   . Asthma   . Bipolar disorder (HCC)   . Chickenpox   . Depression   . Frequent headaches   . Hypertension   . Migraines   . Pancreatitis      Social History   Socioeconomic History  . Marital status: Single    Spouse name: Not on file  . Number of children: Not on file  . Years of education: Not on file  . Highest education level: Not on file  Occupational History  . Not on file  Social Needs  . Financial resource strain: Not on file  . Food insecurity:    Worry: Not on file    Inability: Not on file  . Transportation needs:    Medical: Not on file    Non-medical: Not on file  Tobacco Use  . Smoking status: Never Smoker  . Smokeless  tobacco: Never Used  Substance and Sexual Activity  . Alcohol use: Yes    Alcohol/week: 1.0 standard drinks    Types: 1 Glasses of wine per week    Comment: socially   . Drug use: No  . Sexual activity: Yes    Partners: Male    Birth control/protection: I.U.D., Condom  Lifestyle  . Physical activity:    Days per week: Not on file    Minutes per session: Not on file  . Stress: Not on file  Relationships  . Social connections:    Talks on phone: Not on file    Gets together: Not on file    Attends religious service: Not on file    Active member of club  or organization: Not on file    Attends meetings of clubs or organizations: Not on file    Relationship status: Not on file  . Intimate partner violence:    Fear of current or ex partner: Not on file    Emotionally abused: Not on file    Physically abused: Not on file    Forced sexual activity: Not on file  Other Topics Concern  . Not on file  Social History Narrative  . Not on file    Past Surgical History:  Procedure Laterality Date  . DG GALL BLADDER  2003  . GASTRIC BYPASS  2016  . KNEE ARTHROSCOPY Left 08/26/2017    Family History  Problem Relation Age of Onset  . Asthma Mother   . COPD Mother   . Hyperlipidemia Mother   . Hypertension Mother   . Stroke Mother   . Arthritis Father   . Diabetes Father   . Heart disease Father   . Hyperlipidemia Father   . Hypertension Father   . Kidney disease Father   . Asthma Sister   . Depression Sister   . Asthma Brother   . Depression Brother   . Uterine cancer Maternal Grandmother 79    Allergies  Allergen Reactions  . Clindamycin Anaphylaxis  . Prednisone Other (See Comments) and Nausea And Vomiting    States any steroids pancreatitis pancreatitis  . Dexamethasone Nausea And Vomiting  . Metformin And Related Other (See Comments)    Severe diarrhea  . Other Nausea And Vomiting    Steroid that starts with a C  - causes Pancreatis   . Morphine Anxiety  . Povidone Iodine Hives and Rash    Current Outpatient Medications on File Prior to Visit  Medication Sig Dispense Refill  . acetaminophen (TYLENOL) 325 MG tablet Take 1-2 tablets by mouth as needed.     . fexofenadine (ALLEGRA) 180 MG tablet Take 180 mg by mouth daily as needed.     Marland Kitchen. ibuprofen (ADVIL,MOTRIN) 600 MG tablet Take 1 tablet (600 mg total) by mouth every 6 (six) hours as needed for fever or moderate pain (or headache). 30 tablet 0  . penicillin v potassium (VEETID) 500 MG tablet Take 1 tablet (500 mg total) by mouth every 6 (six) hours for 7 days. 28  tablet 0   No current facility-administered medications on file prior to visit.     BP 102/66 (BP Location: Right Arm, Patient Position: Sitting, Cuff Size: Large)   Pulse 61   Temp 98.3 F (36.8 C) (Oral)   Wt 293 lb 4 oz (133 kg)   LMP 05/23/2018   SpO2 98%   BMI 45.93 kg/m    Objective:   Physical Exam  Constitutional: She appears well-nourished.  Neck: Neck supple.  Cardiovascular: Normal rate and regular rhythm.  Respiratory: Effort normal and breath sounds normal.  Skin: Skin is warm and dry. There is erythema.  Mild erythema to left lower extremity distal to knee. Moderate tenderness with mild palpation. No pitting. Edema evident.  Psychiatric: She has a normal mood and affect.           Assessment & Plan:

## 2018-06-08 NOTE — Patient Instructions (Signed)
You will be contacted regarding your referral to psychiatry and the lymphedema clinic.  Please let us know if you have not been contacted within one week.   Take the fluconazole pill after completion of your antibiotics. Please come see me if your symptoms do not improve.   Continue taking oral penicillin as prescribed.   It was a pleasure to see you today!

## 2018-06-08 NOTE — Assessment & Plan Note (Signed)
Long history of.  Referral placed for lymphedema clinic.

## 2018-06-08 NOTE — Assessment & Plan Note (Signed)
Admitted to [facility] and treated for recurrent cellulitis. Compliant to antibiotics, continue same. Follow up with ID as scheduled. Rx for fluconazole tablet provided to use for potential antibiotic induced yeast infection.  Referral placed for lymphedema clinic to help prevent further cellulitis.  Hospital labs, imaging, notes reviewed.

## 2018-06-09 ENCOUNTER — Ambulatory Visit: Payer: Self-pay | Admitting: Primary Care

## 2018-06-09 ENCOUNTER — Ambulatory Visit (INDEPENDENT_AMBULATORY_CARE_PROVIDER_SITE_OTHER): Payer: BLUE CROSS/BLUE SHIELD | Admitting: Clinical

## 2018-06-09 DIAGNOSIS — F332 Major depressive disorder, recurrent severe without psychotic features: Secondary | ICD-10-CM

## 2018-06-16 ENCOUNTER — Ambulatory Visit (INDEPENDENT_AMBULATORY_CARE_PROVIDER_SITE_OTHER): Payer: BLUE CROSS/BLUE SHIELD | Admitting: Clinical

## 2018-06-16 DIAGNOSIS — F332 Major depressive disorder, recurrent severe without psychotic features: Secondary | ICD-10-CM | POA: Diagnosis not present

## 2018-06-22 ENCOUNTER — Ambulatory Visit (INDEPENDENT_AMBULATORY_CARE_PROVIDER_SITE_OTHER): Payer: BLUE CROSS/BLUE SHIELD | Admitting: Internal Medicine

## 2018-06-22 ENCOUNTER — Ambulatory Visit: Payer: Self-pay | Admitting: Occupational Therapy

## 2018-06-22 ENCOUNTER — Encounter: Payer: Self-pay | Admitting: Internal Medicine

## 2018-06-22 ENCOUNTER — Inpatient Hospital Stay: Payer: Self-pay | Admitting: Internal Medicine

## 2018-06-22 VITALS — BP 168/101 | HR 61 | Temp 98.0°F | Wt 291.0 lb

## 2018-06-22 DIAGNOSIS — Z881 Allergy status to other antibiotic agents status: Secondary | ICD-10-CM

## 2018-06-22 DIAGNOSIS — Z888 Allergy status to other drugs, medicaments and biological substances status: Secondary | ICD-10-CM

## 2018-06-22 DIAGNOSIS — Z8742 Personal history of other diseases of the female genital tract: Secondary | ICD-10-CM | POA: Diagnosis not present

## 2018-06-22 DIAGNOSIS — L03115 Cellulitis of right lower limb: Secondary | ICD-10-CM

## 2018-06-22 DIAGNOSIS — Z885 Allergy status to narcotic agent status: Secondary | ICD-10-CM

## 2018-06-22 DIAGNOSIS — Z91048 Other nonmedicinal substance allergy status: Secondary | ICD-10-CM

## 2018-06-22 DIAGNOSIS — L03119 Cellulitis of unspecified part of limb: Secondary | ICD-10-CM

## 2018-06-22 DIAGNOSIS — I872 Venous insufficiency (chronic) (peripheral): Secondary | ICD-10-CM

## 2018-06-22 MED ORDER — AMOXICILLIN 500 MG PO CAPS: 500.0000 mg | ORAL_CAPSULE | Freq: Three times a day (TID) | ORAL | 3 refills | Status: DC

## 2018-06-22 MED ORDER — PENICILLIN G BENZATHINE 1200000 UNIT/2ML IM SUSP
1.2000 10*6.[IU] | Freq: Once | INTRAMUSCULAR | Status: AC
  Administered 2018-06-22: 1.2 10*6.[IU] via INTRAMUSCULAR

## 2018-06-22 NOTE — Assessment & Plan Note (Signed)
She has now recovered from her latest bout of left leg cellulitis.  She has been hospitalized on many occasions over the past several years because of this problem.  She does not have any more paid time off from her current job and is afraid that she would lose her job if she has another bout this year.  I will restart benzathine penicillin injections every 3 weeks.  I will also agreed to give her a supply of oral amoxicillin to have on hand if she develops another bout of cellulitis.  On several occasions she has been able to abort severe cellulitis and avoid a hospitalization when she self treats very early after her symptoms began.  She is scheduled to be seen in the lymphedema clinic later this week.  She will follow-up here in 6 months.

## 2018-06-22 NOTE — Progress Notes (Signed)
[facility]  Patient Active Problem List   Diagnosis Date Noted  . 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    Priority: High  . Cellulitis and abscess of left lower extremity 06/07/2018  . Lower abdominal pain 06/01/2018  . Bronchitis 04/29/2018  . Chronic back pain 03/09/2018  . Preventative health care 03/09/2018  . Skin rash 11/23/2017  . Abnormal Pap smear of cervix 11/06/2017  . Morbid obesity (HCC) 10/28/2016  . Status post gastric bypass for obesity 10/28/2016  . Lymphedema of lower extremity 10/28/2016  . Bipolar disorder (HCC) 10/28/2016  . Hypertension 10/28/2016  . Polycystic ovary syndrome 10/28/2016    Patient's Medications  New Prescriptions   No medications on file  Previous Medications   ACETAMINOPHEN (TYLENOL) 325 MG TABLET    Take 1-2 tablets by mouth as needed.    FEXOFENADINE (ALLEGRA) 180 MG TABLET    Take 180 mg by mouth daily as needed.    IBUPROFEN (ADVIL,MOTRIN) 600 MG TABLET    Take 1 tablet (600 mg total) by mouth every 6 (six) hours as needed for fever or moderate pain (or headache).  Modified Medications   No medications on file  Discontinued Medications   No medications on file    Subjective: [name] is in for her hospital follow-up visit.  She has a long history of recurrent left leg cellulitis.  She had been on penicillin injections every 3 weeks until her visit last spring when we decided to stop it because she was having recurrent vaginitis and had not had a bout of cellulitis in several years.  She did well until 1 month ago when she had sudden onset of high fever and left groin pain leading to admission to [facility].  When she first arrived there she did not have physical evidence of cellulitis but developed pain and redness of her left lower leg within the next 24 hours.  She improved on on IV penicillin and was discharged on oral penicillin.  She is feeling much better  now.  Review of Systems: Review of Systems  Constitutional: Negative for chills, diaphoresis and fever.  Gastrointestinal: Negative for abdominal pain, diarrhea, nausea and vomiting.    Past Medical History:  Diagnosis Date  . Anxiety   . Asthma   . Bipolar disorder (HCC)   . Chickenpox   . Depression   . Frequent headaches   . Hypertension   . Migraines   . Pancreatitis     Social History   Tobacco Use  . Smoking status: Never Smoker  . Smokeless tobacco: Never Used  Substance Use Topics  . Alcohol use: Yes    Alcohol/week: 1.0 standard drinks    Types: 1 Glasses of wine per week    Comment: socially   . Drug use: No    Family History  Problem Relation Age of Onset  . Asthma Mother   . COPD Mother   . Hyperlipidemia Mother   . Hypertension Mother   . Stroke Mother   . Arthritis Father   . Diabetes Father   . Heart disease Father   . Hyperlipidemia Father   . Hypertension Father   . Kidney disease Father   . Asthma Sister   . Depression Sister   . Asthma Brother   . Depression Brother   . Uterine cancer Maternal Grandmother 79    Allergies  Allergen Reactions  . Clindamycin Anaphylaxis  . Prednisone Other (  See Comments) and Nausea And Vomiting    States any steroids pancreatitis pancreatitis  . Dexamethasone Nausea And Vomiting  . Metformin And Related Other (See Comments)    Severe diarrhea  . Other Nausea And Vomiting    Steroid that starts with a C  - causes Pancreatis   . Morphine Anxiety  . Povidone Iodine Hives and Rash    Objective: Vitals:   06/22/18 1102  BP: (!) 168/101  Pulse: 61  Temp: 98 F (36.7 C)  Weight: 291 lb (132 kg)   Body mass index is 45.58 kg/m.  Physical Exam Constitutional:      Comments: She is in good spirits.  Musculoskeletal:     Right lower leg: Edema present.     Left lower leg: Edema present.  Skin:    Comments: Her acute left leg cellulitis has resolved.  She has early stasis dermatitis.   Psychiatric:        Mood and Affect: Mood normal.     Lab Results    Problem List Items Addressed This Visit      High   Recurrent cellulitis of lower extremity - Primary    She has now recovered from her latest bout of left leg cellulitis.  She has been hospitalized on many occasions over the past several years because of this problem.  She does not have any more paid time off from her current job and is afraid that she would lose her job if she has another bout this year.  I will restart benzathine penicillin injections every 3 weeks.  I will also agreed to give her a supply of oral amoxicillin to have on hand if she develops another bout of cellulitis.  On several occasions she has been able to abort severe cellulitis and avoid a hospitalization when she self treats very early after her symptoms began.  She is scheduled to be seen in the lymphedema clinic later this week.  She will follow-up here in 6 months.      Relevant Medications   penicillin g benzathine (BICILLIN LA) 1200000 UNIT/2ML injection 1.2 Million Units (Start on 06/22/2018 11:45 AM)       Cliffton Asters, MD [facility] [phone] pager   [phone] cell 06/22/2018, 11:37 AM

## 2018-06-23 ENCOUNTER — Ambulatory Visit (INDEPENDENT_AMBULATORY_CARE_PROVIDER_SITE_OTHER): Payer: BLUE CROSS/BLUE SHIELD | Admitting: Clinical

## 2018-06-23 ENCOUNTER — Encounter: Payer: Self-pay | Admitting: Family Medicine

## 2018-06-23 ENCOUNTER — Ambulatory Visit (INDEPENDENT_AMBULATORY_CARE_PROVIDER_SITE_OTHER): Payer: BLUE CROSS/BLUE SHIELD | Admitting: Family Medicine

## 2018-06-23 VITALS — BP 140/93 | HR 72 | Wt 292.3 lb

## 2018-06-23 DIAGNOSIS — F332 Major depressive disorder, recurrent severe without psychotic features: Secondary | ICD-10-CM

## 2018-06-23 DIAGNOSIS — Z30432 Encounter for removal of intrauterine contraceptive device: Secondary | ICD-10-CM

## 2018-06-23 DIAGNOSIS — T8332XA Displacement of intrauterine contraceptive device, initial encounter: Secondary | ICD-10-CM

## 2018-06-23 DIAGNOSIS — T8389XA Other specified complication of genitourinary prosthetic devices, implants and grafts, initial encounter: Secondary | ICD-10-CM | POA: Diagnosis not present

## 2018-06-23 NOTE — Progress Notes (Signed)
   GYNECOLOGY CLINIC- IUD STRING CHECK PROGRESS NOTE  History:  33 y.o. G0P0000 here today for today for IUD string check; Paragard was placed  11.6.2019.   Reports slightly heavier periods x2 with only 1 day of cramping but then it stopped. She noted a change in her cycle and then this period was much more crampy throughout and she is changing a super tampon q 3 hours.   Was in ER for unrelated complaint 06/01/2018 and had CT that noted IUD had "migrated to the level of the cervix"  The following portions of the patient's history were reviewed and updated as appropriate: allergies, current medications, past family history, past medical history, past social history, past surgical history and problem list. Last pap smear on 6/2019was normal, + HRHPV- needs repeat in 10/2018  Review of Systems:  Pertinent items are noted in HPI.   Objective:  Physical Exam Blood pressure (!) 140/93, pulse 72, weight 292 lb 4.8 oz (132.6 kg), last menstrual period 06/23/2018. Gen: NAD Abd: Soft, nontender and nondistended Pelvic: Normal appearing external genitalia; normal appearing vaginal mucosa and cervix.  IUD strings visualized as was the device, clearly being explused as the prior imaging showed the IUD was at the level of the internal os and now the end of the device is at the external os    IUD Removal  Patient identified, informed consent performed, consent signed.  Patient was in the dorsal lithotomy position, normal external genitalia was noted.  A speculum was placed in the patient's vagina, normal discharge was noted, no lesions. The cervix was visualized, no lesions, no abnormal discharge.  The strings of the IUD were grasped and pulled using ring forceps. The IUD was removed in its entirety. .  Patient tolerated the procedure well.    Patient will use condoms for contraception. Routine preventative health maintenance measures emphasized.    Assessment & Plan:  Cu- IUD removed Counseled on  condom use Patient will call for IUD insertion in the next 4-6 weeks if desired. Understands risk of pregnancy if she does not use condoms with partner Recommend urine pregnancy before insertion   Federico Flake, MD  [facility] [employer], [facility]

## 2018-06-24 ENCOUNTER — Ambulatory Visit: Payer: BLUE CROSS/BLUE SHIELD | Attending: Internal Medicine | Admitting: Occupational Therapy

## 2018-06-24 ENCOUNTER — Other Ambulatory Visit: Payer: Self-pay

## 2018-06-24 ENCOUNTER — Encounter: Payer: Self-pay | Admitting: Occupational Therapy

## 2018-06-24 DIAGNOSIS — I890 Lymphedema, not elsewhere classified: Secondary | ICD-10-CM

## 2018-06-24 NOTE — Patient Instructions (Signed)

## 2018-06-24 NOTE — Therapy (Signed)
[place] [facility] MAIN [facility] SERVICES [address] [place], [place], [address] Phone: [phone]   Fax:  [phone]  Occupational Therapy Evaluation  Patient Details  Name: Kristie Hendricks MRN: [id number] Date of Birth: [birth year] No data recorded  Encounter Date: 06/24/2018  OT End of Session - 06/24/18 0934    Visit Number  1    Number of Visits  36    Date for OT Re-Evaluation  09/22/18       Past Medical History:  Diagnosis Date  . Anxiety   . Asthma   . Bipolar disorder (HCC)   . Chickenpox   . Depression   . Frequent headaches   . Hypertension   . Migraines   . Pancreatitis     Past Surgical History:  Procedure Laterality Date  . DG GALL BLADDER  2003  . GASTRIC BYPASS  2016  . KNEE ARTHROSCOPY Left 08/26/2017    There were no vitals filed for this visit.  Subjective Assessment - 06/24/18 0945    Currently in Pain?  Yes  (Pended)     Pain Score  3   (Pended)     Pain Location  Leg  (Pended)     Pain Orientation  Right;Left  (Pended)     Pain Descriptors / Indicators  Aching;Nagging;Tender;Headache;Pressure;Numbness;Sore;Throbbing;Discomfort;Heaviness;Tightness;Contraction;Tingling;Tiring;Cramping  (Pended)     Pain Type  Chronic pain  (Pended)     Pain Onset  Other (comment)  (Pended)     Pain Frequency  Intermittent  (Pended)     Aggravating Factors   standing, sitting , walking, exercising  (Pended)     Pain Relieving Factors  rubbing, elevating, supine,   (Pended)     Pain Score  4  (Pended)     Pain Location  Back  (Pended)     Pain Type  Chronic pain  (Pended)     Pain Frequency  Intermittent  (Pended)                          OT Education - 06/24/18 0943    Person(s) Educated  Patient    Methods  Explanation;Demonstration    Comprehension  Verbalized understanding;Returned demonstration;Need further instruction                   Patient will benefit from skilled therapeutic  intervention in order to improve the following deficits and impairments:     Visit Diagnosis: Lymphedema, not elsewhere classified    Problem List Patient Active Problem List   Diagnosis Date Noted  . Cellulitis and abscess of left lower extremity 06/07/2018  . Lower abdominal pain 06/01/2018  . Bronchitis 04/29/2018  . Chronic back pain 03/09/2018  . Preventative health care 03/09/2018  . Skin rash 11/23/2017  . Abnormal Pap smear of cervix 11/06/2017  . Morbid obesity (HCC) 10/28/2016  . Status post gastric bypass for obesity 10/28/2016  . Lymphedema of lower extremity 10/28/2016  . 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  . Bipolar disorder (HCC) 10/28/2016  . Hypertension 10/28/2016  . Polycystic ovary syndrome 10/28/2016    Loel Dubonnet, MS, OTR/L, [facility] 06/24/18 6:42 PM   [facility] MAIN [facility] SERVICES [address] [place], [place], [address] Phone: [phone]   Fax:  [phone]  Name: Kristie Hendricks MRN: [id number] Date of Birth: April 14, 1986

## 2018-06-28 ENCOUNTER — Ambulatory Visit: Payer: BLUE CROSS/BLUE SHIELD | Admitting: Occupational Therapy

## 2018-06-28 ENCOUNTER — Telehealth: Payer: Self-pay

## 2018-06-28 ENCOUNTER — Encounter: Payer: Self-pay | Admitting: Occupational Therapy

## 2018-06-28 DIAGNOSIS — I10 Essential (primary) hypertension: Secondary | ICD-10-CM

## 2018-06-28 MED ORDER — HYDROCHLOROTHIAZIDE 12.5 MG PO TABS: 12.5000 mg | ORAL_TABLET | Freq: Every day | ORAL | 0 refills | Status: DC

## 2018-06-28 NOTE — Telephone Encounter (Signed)
Pt called and left a message on triage line stating her BP has been elevated at recent visits and at other doctors as well. She is asking if she could get a rx for a BP med and try it or does she have to come in first. Please advise.

## 2018-06-28 NOTE — Addendum Note (Signed)
Addended by: Judithann Sauger on: 06/28/2018 03:18 PM   Modules accepted: Orders

## 2018-06-28 NOTE — Therapy (Signed)
[place] [facility] MAIN [facility] SERVICES [address] [place], [place], [address] Phone: [phone]   Fax:  [phone]  Occupational Therapy Evaluation  Patient Details  Name: Kristie Hendricks MRN: [id number] Date of Birth: 1985-07-21 Referring Provider (OT): Vernona Rieger, NP   Encounter Date: 06/24/2018    Past Medical History:  Diagnosis Date  . Anxiety   . Asthma   . Bipolar disorder (HCC)   . Chickenpox   . Depression   . Frequent headaches   . Hypertension   . Migraines   . Pancreatitis     Past Surgical History:  Procedure Laterality Date  . DG GALL BLADDER  2003  . GASTRIC BYPASS  2016  . KNEE ARTHROSCOPY Left 08/26/2017    There were no vitals filed for this visit.     [facility] OT Assessment - 06/28/18 0001      Assessment   Medical Diagnosis  mild, stage II, BLE lymphedema 2/2 unknown etiology. Contributing factors inlude suspected CVI, morbid obesity, recurrent cellulitis    Referring Provider (OT)  Vernona Rieger, NP    Onset Date/Surgical Date  --   12/ 2003- initial cellulitis in HS   Hand Dominance  Right    Prior Therapy  no CDT, historically poor compression tolerance by report      Precautions   Precaution Comments  cellulitis precautions      Restrictions   Weight Bearing Restrictions  No      Balance Screen   Has the patient fallen in the past 6 months  No    Has the patient had a decrease in activity level because of a fear of falling?   No    Is the patient reluctant to leave their home because of a fear of falling?   No      Home  Environment   Lives With  Alone      Prior Function   Level of Independence  Independent    Vocation  --   Materials engineer- full time, sedentary   Vocation Requirements  dependent sitting    Leisure  friends, family      IADL   Prior Level of Function Shopping  I    Shopping  --   limited by standing and walking tolerance 2/2 leg swelling a   Prior Level of Function  Light Housekeeping  I    Light Housekeeping  Does personal laundry completely;Maintains house alone or with occasional assistance    Prior Level of Function Meal Prep  I    Meal Prep  Plans, prepares and serves adequate meals independently   seated   Prior Level of Function Community Mobility  I    Education officer, environmental own Geophysical data processor Status  Independent      Vision Assessment   Comment  glasses full time      Activity Tolerance   Activity Tolerance  Endurance does not limit participation in activity      Cognition   Overall Cognitive Status  Within Functional Limits for tasks assessed      Sensation   Light Touch  Appears Intact    Stereognosis  Appears Intact      Coordination   Gross Motor Movements are Fluid and Coordinated  Yes    Fine Motor Movements are Fluid and Coordinated  Yes               OT Treatments/Exercises (OP) -  06/28/18 0001      Transfers   Transfers  Sit to Stand;Stand to Sit    Sit to Stand  7: Independent    Stand to Sit  7: Independent      ADLs   Overall ADLs  basic and instrumental ASDLs requiring standing  and or sitting > 20 minutes limited by increased leg     swelling and associated pain    LB Dressing  leg and foot sweolling limits ablity to fit preferred LB clothing and street shoes    Functional Mobility  leg swelling and associated pain limits car transfers    ADL Comments  leg and back pain limit sleeping and bed mobility    ADL Education Given  Yes      Manual Therapy   Manual Therapy  Edema management    Manual therapy comments  BLE comparative limb volumetrics TBA at initial Rx visit. Pt did not complete Lymphedema Life Imact Scale (LLIS) Will request completion OT visit 1    Edema Management  CDT to include Manual lymphatic draiinage (MLD), skin care, ther ex and compression therapy       Mild, Stage II, BLE lymphedema  2/2 suspected hereditary component. CVI has not been ruled out. Skin  Description Hyper-Keratosis Peau' de Orange Shiny Tight Fibrotic Fatty Doughy Indurated      Mildly tight Mild, > distal leegs and ankles   slight   Hydration Dry Flaky Erythema Macerated   minimal      Color Redness Present Pallor Blanching Hemosiderin Staining Other     x Slight??     Odor Malodorous Yeast  Absent      x   Temperature Warm Cool wnl    x    Pitting Edema   1+ 2+ 3+ 4+ Non-pitting   x         Girth Symmetrical Asymmetrical Other Distribution    L>R Toes to popliteal   Stemmer Sign Positive Negative     Positive bilaterally, L>R    Lymphorrea History Of:  Present Absent     x    Wounds History Of Present Absent Venous Arterial Pressure Size    denies            Signs of Infection Redness Warmth Erythema Acute Swelling Drainage Borders                   Scars Adhesions Hypersensitivity        Sensation Light Touch Deep pressure Hypersensitivty   Present Impaired Present Impaired Absent Impaired   x   x  x   x  Nails WNL Fungus Other        Hair Growth Symmetrical Asymmetrical       Skin Creases Base of toes Ankle Base of Finger Medial Thigh         Slight, L>R                      OT Long Term Goals - 06/28/18 1458      OT LONG TERM GOAL #1   Title  Pt will demonstrate understanding of lymphedema (LE) precautions / prevention principals, including signs / symptoms of cellulitis infection with modified independence using  by using LE Workbook as reference to identify 6 precautions without verbal cues by end of 3rd  OT Rx visit.     Baseline  Max A    Time  4    Period  Days  Status  New    Target Date  --   4th OT Rx visit     OT LONG TERM GOAL #2   Title  Pt will be able to apply and tolerate LLE, knee length, multi-layer, short stretch compression wraps using correct gradient techniques with modified independence (extra time)  to achieve optimal limb volume reduction, and to return affected limb , as closely as  possible, to premorbid size and shape.    Baseline  Max A    Time  4    Period  Days    Status  New    Target Date  --   4th OT visit     OT LONG TERM GOAL #3   Title  Pt to achieve at least 5% limb volume reduction bilaterally  during Intensive Phase CDT to improve functional ambulation and mobility,  to improve tissue integrity and reduce infection risk.    Baseline  Max A    Time  12    Period  Weeks    Status  New    Target Date  09/22/18      OT LONG TERM GOAL #4   Title  During self-management phase of CDT Pt will retain limb volume reductions achieved during Intensive Phase CDT with no more than 2% volume increase with ongoing CG assistance to limit LE progression and further functional decline.    Baseline  Max A    Time  6    Period  Months    Status  New    Target Date  12/25/18      OT LONG TERM GOAL #5   Title  Pt will achieve and sustain no less than 85% compliance with all daily LE self-care home program components throughout Intensive Phase CDT, including daily  impeccable skin care, daily simple self-MLD, daily  gradient compression wrapping, and therapeutic exercise to ensure optimal limb volume reduction and swelling control, infection risk reduction    Baseline  Max A    Time  12    Period  Weeks    Status  New    Target Date  09/22/18            Plan - 06/28/18 1454    Clinical Impression Statement  Ivorie Albro is a 62 y o female presenting with mild, stage II, BLE lymphedema of unknown etiology. Onset in high school years is consistent with onset pattern of lymphedema praecox, and her report of her sister's obvious and chronic leg swelling is also suggestive of a hereditary component. Lymphedema has progressed and been exacerbated by recurrent episodes of cellulitis over the course of > 15 years.  Morbid obesity, both prior to and status post gastric bypass surgery, sedentary lifestyle and full time dependent positioning also contribute to leg swelling. Leg  pain and associated swelling limit basic and instrumental ADLs performance, limits participation in leisure pursuits and productive / work activities, limits social participation, and contributes to impaired body image.  Ms Ramalho will benefit from Occupational Therapy for Intensive and Management Phase Complete Decongestive Therapy (CDT), to include manual lymphatic drainage, skin care, therapeutic exercise and compression therapy, to reduce and normalize limb swelling, to reduce infection risk and improve functional performance in all occupational domains. Emphasis throughout OT course will focus on Pt education for long term LE self-care. . Without skilled OT for CDT LE will progress resulting in worsening condition, ongoing infection risk and further functional decline.    Occupational Profile and client history  currently impacting functional performance  obesity    Occupational performance deficits (Please refer to evaluation for details):  ADL's;IADL's;Rest and Sleep;Social Participation;Leisure;Work;Other   body image   Rehab Potential  Good    Current Impairments/barriers affecting progress:  obesity, dependent poistioning at work, poor tolerance for compression    OT Frequency  2x / week   2-3 x weekly   OT Duration  12 weeks    OT Treatment/Interventions  Self-care/ADL training;Therapeutic exercise;Manual lymph drainage;Coping strategies training;Other (comment);Patient/family education;Compression bandaging;Manual Therapy;DME and/or AE instruction;Therapeutic activities   Intensive Phase LE self care edu,. Compression garment fitting and training   Plan  Consider flat knit, custom, knee high Jobst ELVAREX classic ccl 3 ,( 35-45 mmHg) and Jobst  A-D Relax for HOS    Clinical Decision Making  Several treatment options, min-mod task modification necessary    OT Home Exercise Plan  2 sets of 10, bilaterallym, in sequence- BLE lymphatic pumping ther ex; water walking, water aerobics    Recommended  Other Services  weight losds counceling    Consulted and Agree with Plan of Care  Patient       Patient will benefit from skilled therapeutic intervention in order to improve the following deficits and impairments:  Decreased range of motion, Decreased knowledge of use of DME, Decreased skin integrity, Increased edema, Decreased activity tolerance, Pain, Decreased knowledge of precautions, Decreased mobility, Difficulty walking, Obesity, Decreased coping skills  Visit Diagnosis: Lymphedema, not elsewhere classified - Plan: Ot plan of care cert/re-cert    Problem List Patient Active Problem List   Diagnosis Date Noted  . Cellulitis and abscess of left lower extremity 06/07/2018  . Lower abdominal pain 06/01/2018  . Bronchitis 04/29/2018  . Chronic back pain 03/09/2018  . Preventative health care 03/09/2018  . Skin rash 11/23/2017  . Abnormal Pap smear of cervix 11/06/2017  . Morbid obesity (HCC) 10/28/2016  . Status post gastric bypass for obesity 10/28/2016  . Lymphedema of lower extremity 10/28/2016  . 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  . Bipolar disorder (HCC) 10/28/2016  . Hypertension 10/28/2016  . Polycystic ovary syndrome 10/28/2016    Loel Dubonnetheresa Gilliam, MS, OTR/L, [facility] 06/28/18 3:18 PM  [place] [facility] [address] [place], [place], [address] Phone: [phone]   Fax:  [phone]  Name: Sherian ReinLisa Hendricks MRN: [id number] Date of Birth: 07/02/1985

## 2018-06-28 NOTE — Telephone Encounter (Signed)
Spoken and notified patient of Kristie Hendricks comments. Patient verbalized understanding.  Follow up appt on 07/21/2018

## 2018-06-28 NOTE — Telephone Encounter (Signed)
Please notify patient that I will go ahead and send in a prescription for hydrochlorothiazide 12.5 mg to her pharmacy. Take 1 tablet once daily every morning. I need to see her back in 2-3 weeks for BP check and a lab test. Please schedule.

## 2018-06-30 ENCOUNTER — Ambulatory Visit (INDEPENDENT_AMBULATORY_CARE_PROVIDER_SITE_OTHER): Payer: BLUE CROSS/BLUE SHIELD | Admitting: Clinical

## 2018-06-30 DIAGNOSIS — F332 Major depressive disorder, recurrent severe without psychotic features: Secondary | ICD-10-CM

## 2018-07-01 ENCOUNTER — Encounter: Payer: Self-pay | Admitting: Occupational Therapy

## 2018-07-05 ENCOUNTER — Encounter: Payer: Self-pay | Admitting: Occupational Therapy

## 2018-07-07 ENCOUNTER — Ambulatory Visit (INDEPENDENT_AMBULATORY_CARE_PROVIDER_SITE_OTHER): Payer: BLUE CROSS/BLUE SHIELD | Admitting: Clinical

## 2018-07-07 DIAGNOSIS — F332 Major depressive disorder, recurrent severe without psychotic features: Secondary | ICD-10-CM

## 2018-07-08 ENCOUNTER — Encounter: Payer: Self-pay | Admitting: Occupational Therapy

## 2018-07-12 ENCOUNTER — Encounter: Payer: Self-pay | Admitting: Occupational Therapy

## 2018-07-13 ENCOUNTER — Ambulatory Visit: Payer: BLUE CROSS/BLUE SHIELD | Attending: Internal Medicine | Admitting: Occupational Therapy

## 2018-07-13 DIAGNOSIS — I890 Lymphedema, not elsewhere classified: Secondary | ICD-10-CM | POA: Diagnosis not present

## 2018-07-13 NOTE — Patient Instructions (Signed)
Compression ihn place for 24 hrs. dont reapply until instructed.

## 2018-07-13 NOTE — Therapy (Signed)
[facility] MAIN [facility] SERVICES [address] [place], [place], [address] Phone: [phone]   Fax:  [phone]  Occupational Therapy Treatment  Patient Details  Name: Kristie Hendricks MRN: [id number] Date of Birth: [birth year] Referring Provider (OT): Vernona Rieger, NP   Encounter Date: 07/13/2018  OT End of Session - 07/13/18 1231    Visit Number  2    Number of Visits  36    Date for OT Re-Evaluation  09/22/18    OT Start Time  0800    OT Stop Time  0910    OT Time Calculation (min)  70 min    Activity Tolerance  Patient tolerated treatment well    Behavior During Therapy  [facility] for tasks assessed/performed       Past Medical History:  Diagnosis Date  . Anxiety   . Asthma   . Bipolar disorder (HCC)   . Chickenpox   . Depression   . Frequent headaches   . Hypertension   . Migraines   . Pancreatitis     Past Surgical History:  Procedure Laterality Date  . DG GALL BLADDER  2003  . GASTRIC BYPASS  2016  . KNEE ARTHROSCOPY Left 08/26/2017    There were no vitals filed for this visit.  Subjective Assessment - 07/13/18 0801    Subjective   Kristie Hendricks presents for OT visit 2/36 to address BLE lymphedema. "My legs have been hurting more lately...more than normal. They have been crampy, heavy, swollen."    Pertinent History  HTN, Morbid obesity, s/p gastric bypass 2016, hx recurrent cellulitis, mother w/ varicose veins    Limitations  impaired standing and walking tolerance- increasesm BLE pain and swelling. difficulty participating in any activity requiring extended dependent positioning of legs, difficulty fitting LB clothing and street shoes,     Repetition  Increases Symptoms    Special Tests  positive stemmer sign base of toes bilaterally    Patient Stated Goals  see SUBJECTIVE    Currently in Pain?  Yes    Pain Score  4     Pain Location  Leg    Pain Orientation  Right;Left    Pain Descriptors / Indicators   Aching;Sore;Discomfort;Heaviness    Pain Onset  Other (comment)          LYMPHEDEMA/ONCOLOGY QUESTIONNAIRE - 07/13/18 0807      Lymphedema Assessments   Lymphedema Assessments  Lower extremities      Right Lower Extremity Lymphedema   Other  RLE A-D limb volume = 4251.06 ml. RLE E-G limb volume = 6005.17 ml. RLE A-G limb volume = 10256.23 ml.     Other  Limb volume differential for RLE A-D=7.55 %, R>L. LVD of  E-G = 6.25,  LVD A-G = 0.53%, L>R.      Left Lower Extremity Lymphedema   Other  LLE A-D (leg) volume = 3930.27 ml. LLE E-G  ( thigh) volume = 6380.79 ml. LLE A-G ( full leg) volume = 10311.06 ml.              OT Treatments/Exercises (OP) - 07/13/18 0001      ADLs   ADL Education Given  Yes      Manual Therapy   Manual Therapy  Edema management;Compression Bandaging    Manual therapy comments  BLE comparative limb volumetrics    Compression Bandaging  RLE knee length gradient compression wraps w/ multiple layers of ss bandages ss bandages  over cotton stockinett and  Rosidal foam             OT Education - 07/13/18 1231    Education Details  Continued skilled Pt/caregiver education  And LE ADL training throughout visit for lymphedema self care/ home program, including compression wrapping, compression garment and device wear/care, lymphatic pumping ther ex, simple self-MLD, and skin care. Discussed progress towards goals.     Person(s) Educated  Patient    Methods  Explanation;Demonstration    Comprehension  Verbalized understanding;Returned demonstration;Need further instruction          OT Long Term Goals - 06/28/18 1458      OT LONG TERM GOAL #1   Title  Pt will demonstrate understanding of lymphedema (LE) precautions / prevention principals, including signs / symptoms of cellulitis infection with modified independence using  by using LE Workbook as reference to identify 6 precautions without verbal cues by end of 3rd  OT Rx visit.     Baseline   Max A    Time  4    Period  Days    Status  New    Target Date  --   4th OT Rx visit     OT LONG TERM GOAL #2   Title  Pt will be able to apply and tolerate LLE, knee length, multi-layer, short stretch compression wraps using correct gradient techniques with modified independence (extra time)  to achieve optimal limb volume reduction, and to return affected limb , as closely as possible, to premorbid size and shape.    Baseline  Max A    Time  4    Period  Days    Status  New    Target Date  --   4th OT visit     OT LONG TERM GOAL #3   Title  Pt to achieve at least 5% limb volume reduction bilaterally  during Intensive Phase CDT to improve functional ambulation and mobility,  to improve tissue integrity and reduce infection risk.    Baseline  Max A    Time  12    Period  Weeks    Status  New    Target Date  09/22/18      OT LONG TERM GOAL #4   Title  During self-management phase of CDT Pt will retain limb volume reductions achieved during Intensive Phase CDT with no more than 2% volume increase with ongoing CG assistance to limit LE progression and further functional decline.    Baseline  Max A    Time  6    Period  Months    Status  New    Target Date  12/25/18      OT LONG TERM GOAL #5   Title  Pt will achieve and sustain no less than 85% compliance with all daily LE self-care home program components throughout Intensive Phase CDT, including daily  impeccable skin care, daily simple self-MLD, daily  gradient compression wrapping, and therapeutic exercise to ensure optimal limb volume reduction and swelling control, infection risk reduction    Baseline  Max A    Time  12    Period  Weeks    Status  New    Target Date  09/22/18            Plan - 07/13/18 1302    Clinical Impression Statement  Despite overall limb volumetrics revealing WNL limb volume differential of 0.53%, RLE ( dominnant) is greeater in volume below the knee than LLE by 7.55%. RLE volume  is  distributed distally. Pt tolerated initial application of knee length, RLE gradient compression wrap without difficulty. Cont as per POC.    Occupational Profile and client history currently impacting functional performance  obesity    Occupational performance deficits (Please refer to evaluation for details):  ADL's;IADL's;Rest and Sleep;Social Participation;Leisure;Work;Other   body image   Rehab Potential  Good    Current Impairments/barriers affecting progress:  obesity, dependent poistioning at work, poor tolerance for compression    OT Frequency  2x / week   2-3 x weekly   OT Duration  12 weeks    OT Treatment/Interventions  Self-care/ADL training;Therapeutic exercise;Manual lymph drainage;Coping strategies training;Other (comment);Patient/family education;Compression bandaging;Manual Therapy;DME and/or AE instruction;Therapeutic activities   Intensive Phase LE self care edu,. Compression garment fitting and training   Plan  Consider flat knit, custom, knee high Jobst ELVAREX classic ccl 3 ,( 35-45 mmHg) and Jobst  A-D Relax for HOS    Clinical Decision Making  Several treatment options, min-mod task modification necessary    OT Home Exercise Plan  2 sets of 10, bilaterallym, in sequence- BLE lymphatic pumping ther ex; water walking, water aerobics    Recommended Other Services  weight losds counceling    Consulted and Agree with Plan of Care  Patient       Patient will benefit from skilled therapeutic intervention in order to improve the following deficits and impairments:  Decreased range of motion, Decreased knowledge of use of DME, Decreased skin integrity, Increased edema, Decreased activity tolerance, Pain, Decreased knowledge of precautions, Decreased mobility, Difficulty walking, Obesity, Decreased coping skills  Visit Diagnosis: Lymphedema, not elsewhere classified    Problem List Patient Active Problem List   Diagnosis Date Noted  . Cellulitis and abscess of left lower  extremity 06/07/2018  . Lower abdominal pain 06/01/2018  . Bronchitis 04/29/2018  . Chronic back pain 03/09/2018  . Preventative health care 03/09/2018  . Skin rash 11/23/2017  . Abnormal Pap smear of cervix 11/06/2017  . Morbid obesity (HCC) 10/28/2016  . Status post gastric bypass for obesity 10/28/2016  . Lymphedema of lower extremity 10/28/2016  . 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  . Bipolar disorder (HCC) 10/28/2016  . Hypertension 10/28/2016  . Polycystic ovary syndrome 10/28/2016    Loel Dubonnet, MS, OTR/L, [facility] 07/13/18 1:07 PM  [place] [facility] MAIN [facility] SERVICES [address] [place], [place], [address] Phone: [phone]   Fax:  [phone]  Name: Kristie Hendricks MRN: [id number] Date of Birth: [birth year]

## 2018-07-14 ENCOUNTER — Ambulatory Visit (INDEPENDENT_AMBULATORY_CARE_PROVIDER_SITE_OTHER): Payer: BLUE CROSS/BLUE SHIELD | Admitting: Clinical

## 2018-07-14 ENCOUNTER — Ambulatory Visit (INDEPENDENT_AMBULATORY_CARE_PROVIDER_SITE_OTHER): Payer: BLUE CROSS/BLUE SHIELD

## 2018-07-14 DIAGNOSIS — F332 Major depressive disorder, recurrent severe without psychotic features: Secondary | ICD-10-CM | POA: Diagnosis not present

## 2018-07-14 DIAGNOSIS — L03119 Cellulitis of unspecified part of limb: Secondary | ICD-10-CM

## 2018-07-14 MED ORDER — PENICILLIN G BENZATHINE 1200000 UNIT/2ML IM SUSP
1.2000 10*6.[IU] | Freq: Once | INTRAMUSCULAR | Status: AC
  Administered 2018-07-14: 1.2 10*6.[IU] via INTRAMUSCULAR

## 2018-07-14 NOTE — Progress Notes (Signed)
Nurse visit for 1.2 Million units/82ml  of bicillin  IM for cellulitis every 3 weeks. RN present for chaperone. Patient tolerated administration well.  Valarie Cones, LPN

## 2018-07-15 ENCOUNTER — Ambulatory Visit: Payer: BLUE CROSS/BLUE SHIELD | Admitting: Occupational Therapy

## 2018-07-15 ENCOUNTER — Encounter: Payer: Self-pay | Admitting: Occupational Therapy

## 2018-07-15 DIAGNOSIS — I890 Lymphedema, not elsewhere classified: Secondary | ICD-10-CM

## 2018-07-15 NOTE — Therapy (Signed)
[place] [facility] MAIN [facility] [address] [place], [place], [address] Phone: [phone]   Fax:  [phone]  Occupational Therapy Treatment  Patient Details  Name: Kristie Hendricks MRN: [id number] Date of Birth: 08/04/1985 Referring Provider (OT): Vernona RiegerKatherine Clark, NP   Encounter Date: 07/15/2018  OT End of Session - 07/15/18 0929    Visit Number  3    Number of Visits  36    Date for OT Re-Evaluation  09/22/18    OT Start Time  0805    OT Stop Time  0910    OT Time Calculation (min)  65 min    Activity Tolerance  Patient tolerated treatment well;No increased pain    Behavior During Therapy  WFL for tasks assessed/performed       Past Medical History:  Diagnosis Date  . Anxiety   . Asthma   . Bipolar disorder (HCC)   . Chickenpox   . Depression   . Frequent headaches   . Hypertension   . Migraines   . Pancreatitis     Past Surgical History:  Procedure Laterality Date  . DG GALL BLADDER  2003  . GASTRIC BYPASS  2016  . KNEE ARTHROSCOPY Left 08/26/2017    There were no vitals filed for this visit.  Subjective Assessment - 07/15/18 [id number]    Subjective   Kristie ReinLisa  Hendricks presents for OT visit 3/36 to address BLE lymphedema. "My legs have been hurting more lately...more than normal. They have been crampy, heavy, swollen."    Pertinent History  HTN, Morbid obesity, s/p gastric bypass 2016, hx recurrent cellulitis, mother w/ varicose veins    Limitations  impaired standing and walking tolerance- increasesm BLE pain and swelling. difficulty participating in any activity requiring extended dependent positioning of legs, difficulty fitting LB clothing and street shoes,     Repetition  Increases Symptoms    Special Tests  positive stemmer sign base of toes bilaterally    Patient Stated Goals  see SUBJECTIVE    Pain Onset  Other (comment)                   OT Treatments/Exercises (OP) - 07/15/18 0001      ADLs   ADL Education  Given  Yes      Manual Therapy   Manual Therapy  Edema management;Manual Lymphatic Drainage (MLD);Compression Bandaging    Manual Lymphatic Drainage (MLD)  MLD to RLE using short neck sequence, deep abdominal breathing, and functional inguinal LN. Goood tolerance.             OT Education - 07/15/18 [phone]    Education Details  Emphasis of LE self care training on gradient compression wrapping    Person(s) Educated  Patient    Methods  Explanation;Demonstration    Comprehension  Verbalized understanding;Returned demonstration;Need further instruction;Verbal cues required;Tactile cues required          OT Long Term Goals - 06/28/18 1458      OT LONG TERM GOAL #1   Title  Pt will demonstrate understanding of lymphedema (LE) precautions / prevention principals, including signs / symptoms of cellulitis infection with modified independence using  by using LE Workbook as reference to identify 6 precautions without verbal cues by end of 3rd  OT Rx visit.     Baseline  Max A    Time  4    Period  Days    Status  New    Target Date  --  4th OT Rx visit     OT LONG TERM GOAL #2   Title  Pt will be able to apply and tolerate LLE, knee length, multi-layer, short stretch compression wraps using correct gradient techniques with modified independence (extra time)  to achieve optimal limb volume reduction, and to return affected limb , as closely as possible, to premorbid size and shape.    Baseline  Max A    Time  4    Period  Days    Status  New    Target Date  --   4th OT visit     OT LONG TERM GOAL #3   Title  Pt to achieve at least 5% limb volume reduction bilaterally  during Intensive Phase CDT to improve functional ambulation and mobility,  to improve tissue integrity and reduce infection risk.    Baseline  Max A    Time  12    Period  Weeks    Status  New    Target Date  09/22/18      OT LONG TERM GOAL #4   Title  During self-management phase of CDT Pt will retain limb  volume reductions achieved during Intensive Phase CDT with no more than 2% volume increase with ongoing CG assistance to limit LE progression and further functional decline.    Baseline  Max A    Time  6    Period  Months    Status  New    Target Date  12/25/18      OT LONG TERM GOAL #5   Title  Pt will achieve and sustain no less than 85% compliance with all daily LE self-care home program components throughout Intensive Phase CDT, including daily  impeccable skin care, daily simple self-MLD, daily  gradient compression wrapping, and therapeutic exercise to ensure optimal limb volume reduction and swelling control, infection risk reduction    Baseline  Max A    Time  12    Period  Weeks    Status  New    Target Date  09/22/18            Plan - 07/15/18 0930    Clinical Impression Statement   Mild visible RLE limb volume reduction in R cald   today after visit interval. Pt tolerated compression wraps all waking and active hours after session. She removed wraps for sleep. Pt tolerasted RLE MLD today without difficulty.  By end of session with skilled instruction she was able to apply gradient RLE leg wrap with mod A. Cont as per POC.    Occupational Profile and client history currently impacting functional performance  obesity    Occupational performance deficits (Please refer to evaluation for details):  ADL's;IADL's;Rest and Sleep;Social Participation;Leisure;Work;Other   body image   Rehab Potential  Good    Current Impairments/barriers affecting progress:  obesity, dependent poistioning at work, poor tolerance for compression    OT Frequency  2x / week   2-3 x weekly   OT Duration  12 weeks    OT Treatment/Interventions  Self-care/ADL training;Therapeutic exercise;Manual lymph drainage;Coping strategies training;Other (comment);Patient/family education;Compression bandaging;Manual Therapy;DME and/or AE instruction;Therapeutic activities   Intensive Phase LE self care edu,.  Compression garment fitting and training   Plan  Consider flat knit, custom, knee high Jobst ELVAREX classic ccl 3 ,( 35-45 mmHg) and Jobst  A-D Relax for HOS    Clinical Decision Making  Several treatment options, min-mod task modification necessary    OT Home Exercise Plan  2 sets of 10, bilaterallym, in sequence- BLE lymphatic pumping ther ex; water walking, water aerobics    Recommended Other Services  weight losds counceling    Consulted and Agree with Plan of Care  Patient       Patient will benefit from skilled therapeutic intervention in order to improve the following deficits and impairments:  Decreased range of motion, Decreased knowledge of use of DME, Decreased skin integrity, Increased edema, Decreased activity tolerance, Pain, Decreased knowledge of precautions, Decreased mobility, Difficulty walking, Obesity, Decreased coping skills  Visit Diagnosis: Lymphedema, not elsewhere classified    Problem List Patient Active Problem List   Diagnosis Date Noted  . Cellulitis and abscess of left lower extremity 06/07/2018  . Lower abdominal pain 06/01/2018  . Bronchitis 04/29/2018  . Chronic back pain 03/09/2018  . Preventative health care 03/09/2018  . Skin rash 11/23/2017  . Abnormal Pap smear of cervix 11/06/2017  . Morbid obesity (HCC) 10/28/2016  . Status post gastric bypass for obesity 10/28/2016  . Lymphedema of lower extremity 10/28/2016  . 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  . Bipolar disorder (HCC) 10/28/2016  . Hypertension 10/28/2016  . Polycystic ovary syndrome 10/28/2016    Loel Dubonnet, MS, OTR/L, [facility] 07/15/18 9:33 AM  [place] [facility] MAIN [facility] SERVICES [address] [place], [place], [address] Phone: [phone]   Fax:  [phone]  Name: Kristie Hendricks MRN: [id number] Date of Birth: [birth year]

## 2018-07-19 ENCOUNTER — Encounter: Payer: Self-pay | Admitting: Occupational Therapy

## 2018-07-20 ENCOUNTER — Ambulatory Visit (INDEPENDENT_AMBULATORY_CARE_PROVIDER_SITE_OTHER): Payer: BLUE CROSS/BLUE SHIELD | Admitting: Psychiatry

## 2018-07-20 ENCOUNTER — Encounter: Payer: Self-pay | Admitting: Psychiatry

## 2018-07-20 ENCOUNTER — Ambulatory Visit: Payer: BLUE CROSS/BLUE SHIELD | Admitting: Occupational Therapy

## 2018-07-20 VITALS — BP 139/87 | HR 75 | Temp 98.0°F | Wt 289.2 lb

## 2018-07-20 DIAGNOSIS — F3160 Bipolar disorder, current episode mixed, unspecified: Secondary | ICD-10-CM | POA: Diagnosis not present

## 2018-07-20 DIAGNOSIS — F5105 Insomnia due to other mental disorder: Secondary | ICD-10-CM | POA: Diagnosis not present

## 2018-07-20 DIAGNOSIS — R609 Edema, unspecified: Secondary | ICD-10-CM | POA: Insufficient documentation

## 2018-07-20 DIAGNOSIS — I890 Lymphedema, not elsewhere classified: Secondary | ICD-10-CM

## 2018-07-20 MED ORDER — LAMOTRIGINE 25 MG PO TABS: 25.0000 mg | ORAL_TABLET | Freq: Every day | ORAL | 0 refills | Status: DC

## 2018-07-20 MED ORDER — TRAZODONE HCL 50 MG PO TABS: 25.0000 mg | ORAL_TABLET | Freq: Every evening | ORAL | 1 refills | Status: AC | PRN

## 2018-07-20 NOTE — Patient Instructions (Signed)
Lamotrigine tablets What is this medicine? LAMOTRIGINE (la MOE Hendricks Limes) is used to control seizures in adults and children with epilepsy and Lennox-Gastaut syndrome. It is also used in adults to treat bipolar disorder. This medicine may be used for other purposes; ask your health care provider or pharmacist if you have questions. COMMON BRAND NAME(S): Lamictal, Subvenite What should I tell my health care provider before I take this medicine? They need to know if you have any of these conditions: -aseptic meningitis during prior use of lamotrigine -depression -folate deficiency -kidney disease -liver disease -suicidal thoughts, plans, or attempt; a previous suicide attempt by you or a family member -an unusual or allergic reaction to lamotrigine or other seizure medications, other medicines, foods, dyes, or preservatives -pregnant or trying to get pregnant -breast-feeding How should I use this medicine? Take this medicine by mouth with a glass of water. Follow the directions on the prescription label. Do not chew these tablets. If this medicine upsets your stomach, take it with food or milk. Take your doses at regular intervals. Do not take your medicine more often than directed. A special MedGuide will be given to you by the pharmacist with each new prescription and refill. Be sure to read this information carefully each time. Talk to your pediatrician regarding the use of this medicine in children. While this drug may be prescribed for children as young as 2 years for selected conditions, precautions do apply. Overdosage: If you think you have taken too much of this medicine contact a poison control center or emergency room at once. NOTE: This medicine is only for you. Do not share this medicine with others. What if I miss a dose? If you miss a dose, take it as soon as you can. If it is almost time for your next dose, take only that dose. Do not take double or extra doses. What may interact  with this medicine? -atazanavir -carbamazepine -female hormones, including contraceptive or birth control pills -lopinavir -methotrexate -phenobarbital -phenytoin -primidone -pyrimethamine -rifampin -ritonavir -trimethoprim -valproic acid This list may not describe all possible interactions. Give your health care provider a list of all the medicines, herbs, non-prescription drugs, or dietary supplements you use. Also tell them if you smoke, drink alcohol, or use illegal drugs. Some items may interact with your medicine. What should I watch for while using this medicine? Visit your doctor or health care professional for regular checks on your progress. If you take this medicine for seizures, wear a Medic Alert bracelet or necklace. Carry an identification card with information about your condition, medicines, and doctor or health care professional. It is important to take this medicine exactly as directed. When first starting treatment, your dose will need to be adjusted slowly. It may take weeks or months before your dose is stable. You should contact your doctor or health care professional if your seizures get worse or if you have any new types of seizures. Do not stop taking this medicine unless instructed by your doctor or health care professional. Stopping your medicine suddenly can increase your seizures or their severity. Contact your doctor or health care professional right away if you develop a rash while taking this medicine. Rashes may be very severe and sometimes require treatment in the hospital. Deaths from rashes have occurred. Serious rashes occur more often in children than adults taking this medicine. It is more common for these serious rashes to occur during the first 2 months of treatment, but a rash can occur at  any time. You may get drowsy, dizzy, or have blurred vision. Do not drive, use machinery, or do anything that needs mental alertness until you know how this medicine  affects you. To reduce dizzy or fainting spells, do not sit or stand up quickly, especially if you are an older patient. Alcohol can increase drowsiness and dizziness. Avoid alcoholic drinks. If you are taking this medicine for bipolar disorder, it is important to report any changes in your mood to your doctor or health care professional. If your condition gets worse, you get mentally depressed, feel very hyperactive or manic, have difficulty sleeping, or have thoughts of hurting yourself or committing suicide, you need to get help from your health care professional right away. If you are a caregiver for someone taking this medicine for bipolar disorder, you should also report these behavioral changes right away. The use of this medicine may increase the chance of suicidal thoughts or actions. Pay special attention to how you are responding while on this medicine. Your mouth may get dry. Chewing sugarless gum or sucking hard candy, and drinking plenty of water may help. Contact your doctor if the problem does not go away or is severe. Women who become pregnant while using this medicine may enroll in the [place] American Antiepileptic Drug Pregnancy Registry by calling ([phone]. This registry collects information about the safety of antiepileptic drug use during pregnancy. This medicine may cause a decrease in folic acid. You should make sure that you get enough folic acid while you are taking this medicine. Discuss the foods you eat and the vitamins you take with your health care professional. What side effects may I notice from receiving this medicine? Side effects that you should report to your doctor or health care professional as soon as possible: -allergic reactions like skin rash, itching or hives, swelling of the face, lips, or tongue -changes in vision -depressed mood -elevated mood, decreased need for sleep, racing thoughts, impulsive behavior -fever with rash, swollen lymph nodes, or  swelling of the face -loss of balance or coordination -mouth sores -redness, blistering, peeling or loosening of the skin, including inside the mouth -right upper belly pain -seizures -severe muscle pain -signs and symptoms of aseptic meningitis such as stiff neck and sensitivity to light, headache, drowsiness, fever, nausea, vomiting, rash -signs of infection - fever or chills, cough, sore throat, pain or difficulty passing urine -suicidal thoughts or other mood changes -swollen lymph nodes -trouble walking -unusual bruising or bleeding -unusually weak or tired -yellowing of the eyes or skin Side effects that usually do not require medical attention (report to your doctor or health care professional if they continue or are bothersome): -diarrhea -dizziness -dry mouth -stuffy nose -tiredness -tremors -trouble sleeping This list may not describe all possible side effects. Call your doctor for medical advice about side effects. You may report side effects to FDA at 1-800-FDA-1088. Where should I keep my medicine? Keep out of reach of children. Store at room temperature between 15 and 30 degrees C (59 and 86 degrees F). Throw away any unused medicine after the expiration date. NOTE: This sheet is a summary. It may not cover all possible information. If you have questions about this medicine, talk to your doctor, pharmacist, or health care provider.  2019 Elsevier/Gold Standard (2016-12-29 16:07:39) Trazodone tablets What is this medicine? TRAZODONE (TRAZ oh done) is used to treat depression. This medicine may be used for other purposes; ask your health care provider or pharmacist if you have questions.  COMMON BRAND NAME(S): Desyrel What should I tell my health care provider before I take this medicine? They need to know if you have any of these conditions: -attempted suicide or thinking about it -bipolar disorder -bleeding problems -glaucoma -heart disease, or previous heart  attack -irregular heart beat -kidney or liver disease -low levels of sodium in the blood -an unusual or allergic reaction to trazodone, other medicines, foods, dyes or preservatives -pregnant or trying to get pregnant -breast-feeding How should I use this medicine? Take this medicine by mouth with a glass of water. Follow the directions on the prescription label. Take this medicine shortly after a meal or a light snack. Take your medicine at regular intervals. Do not take your medicine more often than directed. Do not stop taking this medicine suddenly except upon the advice of your doctor. Stopping this medicine too quickly may cause serious side effects or your condition may worsen. A special MedGuide will be given to you by the pharmacist with each prescription and refill. Be sure to read this information carefully each time. Talk to your pediatrician regarding the use of this medicine in children. Special care may be needed. Overdosage: If you think you have taken too much of this medicine contact a poison control center or emergency room at once. NOTE: This medicine is only for you. Do not share this medicine with others. What if I miss a dose? If you miss a dose, take it as soon as you can. If it is almost time for your next dose, take only that dose. Do not take double or extra doses. What may interact with this medicine? Do not take this medicine with any of the following medications: -certain medicines for fungal infections like fluconazole, itraconazole, ketoconazole, posaconazole, voriconazole -cisapride -dofetilide -dronedarone -linezolid -MAOIs like Carbex, Eldepryl, Marplan, Nardil, and Parnate -mesoridazine -methylene blue (injected into a vein) -pimozide -saquinavir -thioridazine This medicine may also interact with the following medications: -alcohol -antiviral medicines for HIV or AIDS -aspirin and aspirin-like medicines -barbiturates like phenobarbital -certain  medicines for blood pressure, heart disease, irregular heart beat -certain medicines for depression, anxiety, or psychotic disturbances -certain medicines for migraine headache like almotriptan, eletriptan, frovatriptan, naratriptan, rizatriptan, sumatriptan, zolmitriptan -certain medicines for seizures like carbamazepine and phenytoin -certain medicines for sleep -certain medicines that treat or prevent blood clots like dalteparin, enoxaparin, warfarin -digoxin -fentanyl -lithium -NSAIDS, medicines for pain and inflammation, like ibuprofen or naproxen -other medicines that prolong the QT interval (cause an abnormal heart rhythm) -rasagiline -supplements like St. John's wort, kava kava, valerian -tramadol -tryptophan This list may not describe all possible interactions. Give your health care provider a list of all the medicines, herbs, non-prescription drugs, or dietary supplements you use. Also tell them if you smoke, drink alcohol, or use illegal drugs. Some items may interact with your medicine. What should I watch for while using this medicine? Tell your doctor if your symptoms do not get better or if they get worse. Visit your doctor or health care professional for regular checks on your progress. Because it may take several weeks to see the full effects of this medicine, it is important to continue your treatment as prescribed by your doctor. Patients and their families should watch out for new or worsening thoughts of suicide or depression. Also watch out for sudden changes in feelings such as feeling anxious, agitated, panicky, irritable, hostile, aggressive, impulsive, severely restless, overly excited and hyperactive, or not being able to sleep. If this happens, especially at the  beginning of treatment or after a change in dose, call your health care professional. Bonita Quin may get drowsy or dizzy. Do not drive, use machinery, or do anything that needs mental alertness until you know how this  medicine affects you. Do not stand or sit up quickly, especially if you are an older patient. This reduces the risk of dizzy or fainting spells. Alcohol may interfere with the effect of this medicine. Avoid alcoholic drinks. This medicine may cause dry eyes and blurred vision. If you wear contact lenses you may feel some discomfort. Lubricating drops may help. See your eye doctor if the problem does not go away or is severe. Your mouth may get dry. Chewing sugarless gum, sucking hard candy and drinking plenty of water may help. Contact your doctor if the problem does not go away or is severe. What side effects may I notice from receiving this medicine? Side effects that you should report to your doctor or health care professional as soon as possible: -allergic reactions like skin rash, itching or hives, swelling of the face, lips, or tongue -elevated mood, decreased need for sleep, racing thoughts, impulsive behavior -confusion -fast, irregular heartbeat -feeling faint or lightheaded, falls -feeling agitated, angry, or irritable -loss of balance or coordination -painful or prolonged erections -restlessness, pacing, inability to keep still -suicidal thoughts or other mood changes -tremors -trouble sleeping -seizures -unusual bleeding or bruising Side effects that usually do not require medical attention (report to your doctor or health care professional if they continue or are bothersome): -change in sex drive or performance -change in appetite or weight -constipation -headache -muscle aches or pains -nausea This list may not describe all possible side effects. Call your doctor for medical advice about side effects. You may report side effects to FDA at 1-800-FDA-1088. Where should I keep my medicine? Keep out of the reach of children. Store at room temperature between 15 and 30 degrees C (59 to 86 degrees F). Protect from light. Keep container tightly closed. Throw away any unused  medicine after the expiration date. NOTE: This sheet is a summary. It may not cover all possible information. If you have questions about this medicine, talk to your doctor, pharmacist, or health care provider.  2019 Elsevier/Gold Standard (2017-07-21 17:51:24)

## 2018-07-20 NOTE — Therapy (Deleted)
[facility] MAIN [facility] SERVICES [address] [place], [place], [address] Phone: [phone]   Fax:  [phone]  July 20, 2018    @[email or web address] @  Occupational Therapy Discharge Summary   Patient: Kristie Hendricks MRN: [id number] Date of Birth: [birth year]  Diagnosis: Lymphedema, not elsewhere classified  Referring Provider (OT): Vernona Rieger, NP   The above patient had been seen in Occupational Therapy *** times of *** treatments scheduled with *** no shows and *** cancellations.  The treatment consisted of *** The patient is: {improved/worse/unchanged:[id number]}  Subjective: ***  Discharge Findings: ***  Functional Status at Discharge: ***  {IWOEH:[id number]}  Plan - 07/20/18 1645    Clinical Impression Statement  Janda Smoke is discharged from Occypational Therapy for CDT to address BLE lymphedema. Pt feels she has too many stressors in her life to continue with the regorous demands of CDT 2-3 x weekly at present. She  hopes to resume CDT after she completes moving house and demands of 2 jobs settle down a bit . Pt remains an appropriate candidate fgor OT and CDT in the future. I wish her the best.    Occupational Profile and client history currently impacting functional performance  obesity    Occupational performance deficits (Please refer to evaluation for details):  ADL's;IADL's;Rest and Sleep;Social Participation;Leisure;Work;Other   body image   Rehab Potential  Good    Current Impairments/barriers affecting progress:  obesity, dependent poistioning at work, poor tolerance for compression    OT Frequency  2x / week   2-3 x weekly   OT Duration  12 weeks    OT Treatment/Interventions  Self-care/ADL training;Therapeutic exercise;Manual lymph drainage;Coping strategies training;Other (comment);Patient/family education;Compression bandaging;Manual Therapy;DME and/or AE instruction;Therapeutic activities   Intensive Phase LE self care  edu,. Compression garment fitting and training   Plan  Consider flat knit, custom, knee high Jobst ELVAREX classic ccl 3 ,( 35-45 mmHg) and Jobst  A-D Relax for HOS    Clinical Decision Making  Several treatment options, min-mod task modification necessary    OT Home Exercise Plan  2 sets of 10, bilaterallym, in sequence- BLE lymphatic pumping ther ex; water walking, water aerobics    Recommended Other Services  weight losds counceling    Consulted and Agree with Plan of Care  Patient       Sincerely,  Judithann Sauger, OT  CC @[email or web address] @  [facility] MAIN [facility] SERVICES [address] [place], [place], [address] Phone: [phone]   Fax:  [phone]  Patient: Kristie Hendricks MRN: [id number] Date of Birth: April 26, 1986

## 2018-07-20 NOTE — Therapy (Signed)
[place] MAIN [facility] SERVICES [address] [place], [place], [address] Phone: [phone]   Fax:  [phone]  Occupational Therapy Discharge Summary Lymphedema Care  Patient Details  Name: Kristie Hendricks MRN: [id number] Date of Birth: Sep 04, 1985 Referring Provider (OT): Alma Friendly, NP   Encounter Date: 07/20/2018  OT End of Session - 07/20/18 7341    Visit Number  4    Number of Visits  36    Date for OT Re-Evaluation  09/22/18    OT Start Time  0815    OT Stop Time  0820    OT Time Calculation (min)  5 min    Activity Tolerance  Other (comment)   Pt upset and tearful when she arrived for OT session this morning   Behavior During Therapy  [facility] for tasks assessed/performed       Past Medical History:  Diagnosis Date  . Anxiety   . Asthma   . Bipolar disorder ([place])   . Chickenpox   . Depression   . Frequent headaches   . Hypertension   . Migraines   . Pancreatitis     Past Surgical History:  Procedure Laterality Date  . DG GALL BLADDER  2003  . GASTRIC BYPASS  2016  . KNEE ARTHROSCOPY Left 08/26/2017    There were no vitals filed for this visit.  Subjective Assessment - 07/20/18 1641    Subjective   Ms Kristie Hendricks arrived 11 minutes late OT Rx session 4/36 to address lymphedema care. Pt was tearful and upset stating that she is unable to continue CDT at this time due to significant stressors in her life.    Pertinent History  HTN, Morbid obesity, s/p gastric bypass 2016, hx recurrent cellulitis, mother w/ varicose veins    Limitations  impaired standing and walking tolerance- increasesm BLE pain and swelling. difficulty participating in any activity requiring extended dependent positioning of legs, difficulty fitting LB clothing and street shoes,     Repetition  Increases Symptoms    Special Tests  positive stemmer sign base of toes bilaterally    Patient Stated Goals  see SUBJECTIVE    Currently in Pain?  Other (Comment)   N/A    Pain Onset  Other (comment)                                OT Long Term Goals - 07/20/18 1648      OT LONG TERM GOAL #1   Title  Pt will demonstrate understanding of lymphedema (LE) precautions / prevention principals, including signs / symptoms of cellulitis infection with modified independence using  by using LE Workbook as reference to identify 6 precautions without verbal cues by end of 3rd  OT Rx visit.     Baseline  Max A    Time  4    Period  Days    Status  Achieved      OT LONG TERM GOAL #2   Title  Pt will be able to apply and tolerate LLE, knee length, multi-layer, short stretch compression wraps using correct gradient techniques with modified independence (extra time)  to achieve optimal limb volume reduction, and to return affected limb , as closely as possible, to premorbid size and shape.    Baseline  Max A    Time  4    Period  Days    Status  Achieved  OT LONG TERM GOAL #3   Title  Pt to achieve at least 5% limb volume reduction bilaterally  during Intensive Phase CDT to improve functional ambulation and mobility,  to improve tissue integrity and reduce infection risk.    Baseline  Max A    Time  12    Period  Weeks    Status  Not Met      OT LONG TERM GOAL #4   Title  During self-management phase of CDT Pt will retain limb volume reductions achieved during Intensive Phase CDT with no more than 2% volume increase with ongoing CG assistance to limit LE progression and further functional decline.    Baseline  Max A    Time  6    Period  Months    Status  Not Met      OT LONG TERM GOAL #5   Title  Pt will achieve and sustain no less than 85% compliance with all daily LE self-care home program components throughout Intensive Phase CDT, including daily  impeccable skin care, daily simple self-MLD, daily  gradient compression wrapping, and therapeutic exercise to ensure optimal limb volume reduction and swelling control, infection risk  reduction    Baseline  Max A    Time  12    Period  Weeks    Status  Not Met            Plan - 07/20/18 1645    Clinical Impression Statement  [name] is discharged from [facility] for CDT to address BLE lymphedema. Pt feels she has too many stressors in her life to continue with the regorous demands of CDT 2-3 x weekly at present. She  hopes to resume CDT after she completes moving house and demands of 2 jobs settle down a bit . Pt remains an appropriate candidate fgor OT and CDT in the future. I wish her the best.    Occupational Profile and client history currently impacting functional performance  obesity    Occupational performance deficits (Please refer to evaluation for details):  ADL's;IADL's;Rest and Sleep;Social Participation;Leisure;Work;Other   body image   Rehab Potential  Good    Current Impairments/barriers affecting progress:  obesity, dependent poistioning at work, poor tolerance for compression    OT Frequency  2x / week   2-3 x weekly   OT Duration  12 weeks    OT Treatment/Interventions  Self-care/ADL training;Therapeutic exercise;Manual lymph drainage;Coping strategies training;Other (comment);Patient/family education;Compression bandaging;Manual Therapy;DME and/or AE instruction;Therapeutic activities   Intensive Phase LE self care edu,. Compression garment fitting and training   Plan  Consider flat knit, custom, knee high Jobst ELVAREX classic ccl 3 ,( 35-45 mmHg) and Jobst  A-D Relax for HOS    Clinical Decision Making  Several treatment options, min-mod task modification necessary    OT Home Exercise Plan  2 sets of 10, bilaterallym, in sequence- BLE lymphatic pumping ther ex; water walking, water aerobics    Recommended Other Services  weight losds counceling    Consulted and Agree with Plan of Care  Patient       Patient will benefit from skilled therapeutic intervention in order to improve the following deficits and impairments:  Decreased  range of motion, Decreased knowledge of use of DME, Decreased skin integrity, Increased edema, Decreased activity tolerance, Pain, Decreased knowledge of precautions, Decreased mobility, Difficulty walking, Obesity, Decreased coping skills  Visit Diagnosis: Lymphedema, not elsewhere classified    Problem List Patient Active Problem List   Diagnosis  Date Noted  . Edema 07/20/2018  . Cellulitis and abscess of left lower extremity 06/07/2018  . Lower abdominal pain 06/01/2018  . Bronchitis 04/29/2018  . Chronic back pain 03/09/2018  . Preventative health care 03/09/2018  . Skin rash 11/23/2017  . Abnormal Pap smear of cervix 11/06/2017  . Morbid obesity ([place]) 10/28/2016  . Status post gastric bypass for obesity 10/28/2016  . Lymphedema of lower extremity 10/28/2016  . 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  . Bipolar disorder ([place]) 10/28/2016  . Hypertension 10/28/2016  . Polycystic ovary syndrome 10/28/2016  . Weight gain 09/03/2016  . Acute viral pharyngitis 05/01/2016  . Sciatica of right side 02/07/2016  . Trochanteric bursitis of both hips 01/15/2016  . Lymphedema 04/01/2014  . Sacroiliac dysfunction 03/31/2014  . Hyperlipidemia 03/30/2014  . GERD without esophagitis 12/10/2013  . Paresthesias/numbness 10/20/2013  . Allergic rhinitis 10/16/2013  . Shortness of breath 10/16/2013  . Bipolar I disorder, most recent episode (or current) depressed, severe, specified as with psychotic behavior ([place]) 10/13/2013  . 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 Carpal tunnel syndrome, left 12/16/2012  . Vaginal bleeding 09/14/2012  . Hand pain 08/18/2012  . Leukocytosis 07/13/2012  . Hypokalemia 07/13/2012  . Elevated lipase 05/26/2012  . Fatigue 07/15/2011    Andrey Spearman, MS, OTR/L, [facility] 07/20/18 4:51 PM  [place] MAIN [facility] SERVICES [address] [place], [place], [address] Phone: [phone]   Fax:   [phone]  Name: Allahna Husband MRN: [id number] Date of Birth: 01/15/1986
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
## 2018-07-21 ENCOUNTER — Ambulatory Visit (INDEPENDENT_AMBULATORY_CARE_PROVIDER_SITE_OTHER): Payer: BLUE CROSS/BLUE SHIELD | Admitting: Clinical

## 2018-07-21 ENCOUNTER — Ambulatory Visit: Payer: Self-pay | Admitting: Primary Care

## 2018-07-21 DIAGNOSIS — F332 Major depressive disorder, recurrent severe without psychotic features: Secondary | ICD-10-CM

## 2018-07-22 ENCOUNTER — Ambulatory Visit: Payer: BLUE CROSS/BLUE SHIELD | Admitting: Occupational Therapy

## 2018-07-22 ENCOUNTER — Encounter: Payer: Self-pay | Admitting: Occupational Therapy

## 2018-07-26 ENCOUNTER — Encounter: Payer: Self-pay | Admitting: Occupational Therapy

## 2018-07-26 DIAGNOSIS — M9901 Segmental and somatic dysfunction of cervical region: Secondary | ICD-10-CM | POA: Diagnosis not present

## 2018-07-26 DIAGNOSIS — M542 Cervicalgia: Secondary | ICD-10-CM | POA: Diagnosis not present

## 2018-07-26 DIAGNOSIS — M546 Pain in thoracic spine: Secondary | ICD-10-CM | POA: Diagnosis not present

## 2018-07-26 DIAGNOSIS — M9902 Segmental and somatic dysfunction of thoracic region: Secondary | ICD-10-CM | POA: Diagnosis not present

## 2018-07-27 ENCOUNTER — Encounter: Payer: Self-pay | Admitting: Occupational Therapy

## 2018-07-29 ENCOUNTER — Encounter: Payer: Self-pay | Admitting: Occupational Therapy

## 2018-08-01 ENCOUNTER — Other Ambulatory Visit: Payer: Self-pay | Admitting: Primary Care

## 2018-08-01 DIAGNOSIS — I10 Essential (primary) hypertension: Secondary | ICD-10-CM

## 2018-08-02 ENCOUNTER — Encounter: Payer: Self-pay | Admitting: Occupational Therapy

## 2018-08-02 DIAGNOSIS — M9902 Segmental and somatic dysfunction of thoracic region: Secondary | ICD-10-CM | POA: Diagnosis not present

## 2018-08-02 DIAGNOSIS — M546 Pain in thoracic spine: Secondary | ICD-10-CM | POA: Diagnosis not present

## 2018-08-02 DIAGNOSIS — M542 Cervicalgia: Secondary | ICD-10-CM | POA: Diagnosis not present

## 2018-08-02 DIAGNOSIS — M9901 Segmental and somatic dysfunction of cervical region: Secondary | ICD-10-CM | POA: Diagnosis not present

## 2018-08-03 ENCOUNTER — Encounter: Payer: Self-pay | Admitting: Radiology

## 2018-08-03 ENCOUNTER — Encounter: Payer: Self-pay | Admitting: Occupational Therapy

## 2018-08-03 NOTE — Telephone Encounter (Signed)
Noted.  Refill sent pharmacy. 

## 2018-08-03 NOTE — Telephone Encounter (Signed)
Last prescribed on 06/28/2018 . Last office visit on 06/08/2018. No future appointment

## 2018-08-04 ENCOUNTER — Ambulatory Visit (INDEPENDENT_AMBULATORY_CARE_PROVIDER_SITE_OTHER): Payer: BLUE CROSS/BLUE SHIELD | Admitting: Clinical

## 2018-08-04 ENCOUNTER — Ambulatory Visit (INDEPENDENT_AMBULATORY_CARE_PROVIDER_SITE_OTHER): Payer: BLUE CROSS/BLUE SHIELD | Admitting: *Deleted

## 2018-08-04 ENCOUNTER — Other Ambulatory Visit: Payer: Self-pay

## 2018-08-04 DIAGNOSIS — F332 Major depressive disorder, recurrent severe without psychotic features: Secondary | ICD-10-CM

## 2018-08-04 DIAGNOSIS — L03119 Cellulitis of unspecified part of limb: Secondary | ICD-10-CM

## 2018-08-04 MED ORDER — PENICILLIN G BENZATHINE 1200000 UNIT/2ML IM SUSP
1.2000 10*6.[IU] | Freq: Once | INTRAMUSCULAR | Status: AC
  Administered 2018-08-04: 1.2 10*6.[IU] via INTRAMUSCULAR

## 2018-08-05 ENCOUNTER — Encounter: Payer: Self-pay | Admitting: Occupational Therapy

## 2018-08-05 ENCOUNTER — Ambulatory Visit: Payer: BLUE CROSS/BLUE SHIELD | Admitting: Psychiatry

## 2018-08-05 DIAGNOSIS — M546 Pain in thoracic spine: Secondary | ICD-10-CM | POA: Diagnosis not present

## 2018-08-05 DIAGNOSIS — M9902 Segmental and somatic dysfunction of thoracic region: Secondary | ICD-10-CM | POA: Diagnosis not present

## 2018-08-05 DIAGNOSIS — M542 Cervicalgia: Secondary | ICD-10-CM | POA: Diagnosis not present

## 2018-08-05 DIAGNOSIS — M9901 Segmental and somatic dysfunction of cervical region: Secondary | ICD-10-CM | POA: Diagnosis not present

## 2018-08-09 ENCOUNTER — Encounter: Payer: Self-pay | Admitting: Occupational Therapy

## 2018-08-10 ENCOUNTER — Encounter: Payer: Self-pay | Admitting: Occupational Therapy

## 2018-08-11 ENCOUNTER — Encounter: Payer: Self-pay | Admitting: Psychiatry

## 2018-08-11 ENCOUNTER — Ambulatory Visit (INDEPENDENT_AMBULATORY_CARE_PROVIDER_SITE_OTHER): Payer: BLUE CROSS/BLUE SHIELD | Admitting: Psychiatry

## 2018-08-11 ENCOUNTER — Other Ambulatory Visit: Payer: Self-pay

## 2018-08-11 ENCOUNTER — Ambulatory Visit (INDEPENDENT_AMBULATORY_CARE_PROVIDER_SITE_OTHER): Payer: BLUE CROSS/BLUE SHIELD | Admitting: Clinical

## 2018-08-11 VITALS — BP 133/84 | HR 77 | Temp 98.9°F | Wt 294.4 lb

## 2018-08-11 DIAGNOSIS — F5105 Insomnia due to other mental disorder: Secondary | ICD-10-CM | POA: Diagnosis not present

## 2018-08-11 DIAGNOSIS — H02002 Unspecified entropion of right lower eyelid: Secondary | ICD-10-CM | POA: Insufficient documentation

## 2018-08-11 DIAGNOSIS — F410 Panic disorder [episodic paroxysmal anxiety] without agoraphobia: Secondary | ICD-10-CM

## 2018-08-11 DIAGNOSIS — H01009 Unspecified blepharitis unspecified eye, unspecified eyelid: Secondary | ICD-10-CM | POA: Insufficient documentation

## 2018-08-11 DIAGNOSIS — H1010 Acute atopic conjunctivitis, unspecified eye: Secondary | ICD-10-CM | POA: Insufficient documentation

## 2018-08-11 DIAGNOSIS — F332 Major depressive disorder, recurrent severe without psychotic features: Secondary | ICD-10-CM

## 2018-08-11 DIAGNOSIS — H16149 Punctate keratitis, unspecified eye: Secondary | ICD-10-CM | POA: Insufficient documentation

## 2018-08-11 DIAGNOSIS — F3160 Bipolar disorder, current episode mixed, unspecified: Secondary | ICD-10-CM

## 2018-08-11 DIAGNOSIS — Q103 Other congenital malformations of eyelid: Secondary | ICD-10-CM | POA: Insufficient documentation

## 2018-08-11 MED ORDER — ARIPIPRAZOLE 2 MG PO TABS: 2.0000 mg | ORAL_TABLET | Freq: Every day | ORAL | 0 refills | Status: DC

## 2018-08-11 MED ORDER — CLONAZEPAM 0.5 MG PO TABS: 0.2500 mg | ORAL_TABLET | ORAL | 0 refills | Status: DC

## 2018-08-11 NOTE — Progress Notes (Signed)
BH MD OP Progress Note  08/11/2018 1:36 PM Kristie Hendricks  MRN:  [id number]  Chief Complaint: ' I am here for follow up." Chief Complaint    Follow-up; Medication Refill     HPI: Kristie Hendricks is a 33 yr old Caucasian female, employed, lives in [place], has a history of bipolar disorder, history of gastric bypass, cellulitis, lymphedema, hypertension, presented to clinic today for a follow-up visit.  Patient today reports that she has been struggling with anxiety attacks since the past few days.  She reports she has chest pain, shortness of breath which comes and goes.  This has been getting worse since the past few days.  Patient reports she could not take the Lamictal since she started having falls.  She reports she had stopped taking it and now she feels better.  Patient continues to have mood lability.  Patient reports her sleep is better when she takes her trazodone.  She however reports she has to take the trazodone low-dose of 25 mg.  When she tried taking the higher dosage of 50 mg ,she became very groggy the next day.  She is okay with the 25 mg for now.  Patient reports her work is very stressful since  the corona outbreak is going on.  She reports that her work load is also making her very anxious and restless.  Patient continues to be in therapy sessions with her therapist Charlyne Mom.  She reports that helps.  I have reviewed medical records received from her therapist Donia Pounds 06/30/2018- currently working on strategies to identify maladaptive thought pattern.  Kristie Hendricks is experiencing anxiety relating to her work and home life.  Working on mindfulness, meditation, breathing exercises.'   Visit Diagnosis:    ICD-10-CM   1. 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 Insomnia due to mental disorder F51.05   3. Panic attack F41.0 clonazePAM (KLONOPIN) 0.5 MG tablet    Past Psychiatric History: I have reviewed past psychiatric history from  my progress note on 07/20/2018.  Past trials of medications like risperidone, lithium, Seroquel-made her face going numb, Celexa, Lamictal  Past Medical History:  Past Medical History:  Diagnosis Date  . Anxiety   . Asthma   . Bipolar disorder (HCC)   . Chickenpox   . Depression   . Frequent headaches   . Hypertension   . Migraines   . Pancreatitis     Past Surgical History:  Procedure Laterality Date  . DG GALL BLADDER  2003  . GASTRIC BYPASS  2016  . KNEE ARTHROSCOPY Left 08/26/2017    Family Psychiatric History: Reviewed family psychiatric history from my progress note on 07/20/2018  Family History:  Family History  Problem Relation Age of Onset  . Asthma Mother   . COPD Mother   . Hyperlipidemia Mother   . Hypertension Mother   . Stroke Mother   . Arthritis Father   . Diabetes Father   . Heart disease Father   . Hyperlipidemia Father   . Hypertension Father   . Kidney disease Father   . Anxiety disorder Father   . Asthma Sister   . Depression Sister   . OCD Sister   . Asthma Brother   . Depression Brother   . Uterine cancer Maternal Grandmother 69    Social History: I have reviewed social history from my progress note on 07/20/2018. Social History   Socioeconomic History  . Marital status: Significant Other    Spouse  name: Not on file  . Number of children: 0  . Years of education: Not on file  . Highest education level: Associate degree: occupational, Scientist, product/process development, or vocational program  Occupational History  . Not on file  Social Needs  . Financial resource strain: Not hard at all  . Food insecurity:    Worry: Never true    Inability: Never true  . Transportation needs:    Medical: No    Non-medical: No  Tobacco Use  . Smoking status: Never Smoker  . Smokeless tobacco: Never Used  Substance and Sexual Activity  . Alcohol use: Yes    Alcohol/week: 1.0 standard drinks    Types: 1 Glasses of wine per week    Comment: socially   . Drug use: No  .  Sexual activity: Yes    Partners: Male    Birth control/protection: I.U.D., Condom  Lifestyle  . Physical activity:    Days per week: 0 days    Minutes per session: 0 min  . Stress: Very much  Relationships  . Social connections:    Talks on phone: Not on file    Gets together: Not on file    Attends religious service: Never    Active member of club or organization: No    Attends meetings of clubs or organizations: Never    Relationship status: Never married  Other Topics Concern  . Not on file  Social History Narrative  . Not on file    Allergies:  Allergies  Allergen Reactions  . Clindamycin Anaphylaxis  . Prednisone Other (See Comments) and Nausea And Vomiting    States any steroids pancreatitis pancreatitis  . Dexamethasone Nausea And Vomiting  . Metformin And Related Other (See Comments)    Severe diarrhea  . Other Nausea And Vomiting    Steroid that starts with a C  - causes Pancreatis   . Morphine Anxiety  . Povidone Iodine Hives and Rash    Metabolic Disorder Labs: Lab Results  Component Value Date   HGBA1C 4.9 10/07/2017   No results found for: PROLACTIN Lab Results  Component Value Date   CHOL 137 12/21/2017   TRIG 113.0 12/21/2017   HDL 46.90 12/21/2017   CHOLHDL 3 12/21/2017   VLDL 22.6 12/21/2017   LDLCALC 67 12/21/2017   Lab Results  Component Value Date   TSH 1.310 10/07/2017    Therapeutic Level Labs: No results found for: LITHIUM No results found for: VALPROATE No components found for:  CBMZ  Current Medications: Current Outpatient Medications  Medication Sig Dispense Refill  . famotidine (PEPCID) 40 MG tablet Take by mouth.    . fexofenadine (ALLEGRA) 180 MG tablet Take 180 mg by mouth daily as needed.     . fluconazole (DIFLUCAN) 150 MG tablet     . hydrochlorothiazide (HYDRODIURIL) 12.5 MG tablet TAKE 1 TABLET(12.5 MG) BY MOUTH DAILY FOR BLOOD PRESSURE 90 tablet 1  . lisinopril (PRINIVIL,ZESTRIL) 10 MG tablet Take by mouth.     . traZODone (DESYREL) 50 MG tablet Take 0.5-1 tablets (25-50 mg total) by mouth at bedtime as needed for sleep. 30 tablet 1  . ARIPiprazole (ABILIFY) 2 MG tablet Take 1 tablet (2 mg total) by mouth daily. 30 tablet 0  . clonazePAM (KLONOPIN) 0.5 MG tablet Take 0.5-1 tablets (0.25-0.5 mg total) by mouth as directed. Only for severe anxiety attacks up to 2-3 times a week. 12 tablet 0   No current facility-administered medications for this visit.  Musculoskeletal: Strength & Muscle Tone: within normal limits Gait & Station: normal Patient leans: N/A  Psychiatric Specialty Exam: Review of Systems  Psychiatric/Behavioral: Positive for depression. The patient is nervous/anxious.   All other systems reviewed and are negative.   Blood pressure 133/84, pulse 77, temperature 98.9 F (37.2 C), temperature source Oral, weight 294 lb 6.4 oz (133.5 kg), last menstrual period 07/17/2018.Body mass index is 46.11 kg/m.  General Appearance: Casual  Eye Contact:  Fair  Speech:  Clear and Coherent  Volume:  Normal  Mood:  Anxious  Affect:  Appropriate  Thought Process:  Goal Directed and Descriptions of Associations: Intact  Orientation:  Full (Time, Place, and Person)  Thought Content: Logical   Suicidal Thoughts:  No  Homicidal Thoughts:  No  Memory:  Immediate;   Fair Recent;   Fair Remote;   Fair  Judgement:  Fair  Insight:  Fair  Psychomotor Activity:  Normal  Concentration:  Concentration: Fair and Attention Span: Fair  Recall:  [employer] of Knowledge: Fair  Language: Fair  Akathisia:  No  Handed:  Right  AIMS (if indicated): na  Assets:  Communication Skills Desire for Improvement Social Support  ADL's:  Intact  Cognition: WNL  Sleep:  improving   Screenings: PHQ2-9     Office Visit from 06/22/2018 in [facility] Office Visit from 07/28/2017 in [facility] Office Visit from 05/07/2017 in [facility] Office Visit from 03/26/2017 in [facility]  PHQ-2 Total Score  0  PHQ-9 Total Score  9  -  6  -       Assessment and Plan: Kristie Hendricks is a 33 year old Caucasian female, employed, lives in [place], has a history of bipolar disorder, insomnia, history of gastric bypass, recurrent cellulitis, lymphedema, hypertension, presented to clinic today for a follow-up visit.  Patient is biologically predisposed given her family history as well as history of trauma.  Patient also has psychosocial stressors of financial struggles, work-related stressors and health problems.  Patient currently is having significant anxiety symptoms more so due to the recent work overload due to the corona virus outbreak.  Patient will benefit from continued medication management as well as psychotherapy sessions.  Plan as noted below.  Plan Bipolar disorder type I mixed mild-unstable Discontinue Lamictal for noncompliance and side effects. Start Abilify 2 mg p.o. daily  For panic attacks-unstable Start clonazepam 0.25 to 0.5 mg as needed for severe anxiety attacks.  Discussed with patient to limit use.  She is aware of the risk of being on benzodiazepine therapy long-term.  Discussed with her to use it up to 2-3 times per week and only for severe panic attacks.  For insomnia-improving Continue trazodone 25 to 50 mg p.o. nightly.  Pending lab-TSH  I have reviewed medical records per therapist Eileen Stanford Mendelson-dated 06/30/2018 as summarized above.  Follow-up in clinic in 2 weeks or sooner if needed.  I have spent atleast 25 minutes face to face with patient today. More than 50 % of the time was spent for psychoeducation and supportive psychotherapy and care coordination.  This note was generated in part or whole with voice recognition software. Voice recognition is usually quite accurate but there are transcription errors that can and  very often do occur. I apologize for any typographical errors that were not detected and corrected.  Jomarie Longs, MD 08/11/2018, 1:36 PM

## 2018-08-11 NOTE — Patient Instructions (Signed)
Clonazepam tablets What is this medicine? CLONAZEPAM (kloe NA ze pam) is a benzodiazepine. It is used to treat certain types of seizures. It is also used to treat panic disorder. This medicine may be used for other purposes; ask your health care provider or pharmacist if you have questions. COMMON BRAND NAME(S): Ceberclon, Klonopin What should I tell my health care provider before I take this medicine? They need to know if you have any of these conditions: -an alcohol or drug abuse problem -bipolar disorder, depression, psychosis or other mental health condition -glaucoma -kidney or liver disease -lung or breathing disease -myasthenia gravis -Parkinson's disease -porphyria -seizures or a history of seizures -suicidal thoughts -an unusual or allergic reaction to clonazepam, other benzodiazepines, foods, dyes, or preservatives -pregnant or trying to get pregnant -breast-feeding How should I use this medicine? Take this medicine by mouth with a glass of water. Follow the directions on the prescription label. If it upsets your stomach, take it with food or milk. Take your medicine at regular intervals. Do not take it more often than directed. Do not stop taking or change the dose except on the advice of your doctor or health care professional. A special MedGuide will be given to you by the pharmacist with each prescription and refill. Be sure to read this information carefully each time. Talk to your pediatrician regarding the use of this medicine in children. Special care may be needed. Overdosage: If you think you have taken too much of this medicine contact a poison control center or emergency room at once. NOTE: This medicine is only for you. Do not share this medicine with others. What if I miss a dose? If you miss a dose, take it as soon as you can. If it is almost time for your next dose, take only that dose. Do not take double or extra doses. What may interact with this medicine? Do  not take this medication with any of the following medicines: -narcotic medicines for cough -sodium oxybate This medicine may also interact with the following medications: -alcohol -antihistamines for allergy, cough and cold -antiviral medicines for HIV or AIDS -certain medicines for anxiety or sleep -certain medicines for depression, like amitriptyline, fluoxetine, sertraline -certain medicines for fungal infections like ketoconazole and itraconazole -certain medicines for seizures like carbamazepine, phenobarbital, phenytoin, primidone -general anesthetics like halothane, isoflurane, methoxyflurane, propofol -local anesthetics like lidocaine, pramoxine, tetracaine -medicines that relax muscles for surgery -narcotic medicines for pain -phenothiazines like chlorpromazine, mesoridazine, prochlorperazine, thioridazine This list may not describe all possible interactions. Give your health care provider a list of all the medicines, herbs, non-prescription drugs, or dietary supplements you use. Also tell them if you smoke, drink alcohol, or use illegal drugs. Some items may interact with your medicine. What should I watch for while using this medicine? Tell your doctor or health care professional if your symptoms do not start to get better or if they get worse. Do not stop taking except on your doctor's advice. You may develop a severe reaction. Your doctor will tell you how much medicine to take. You may get drowsy or dizzy. Do not drive, use machinery, or do anything that needs mental alertness until you know how this medicine affects you. To reduce the risk of dizzy and fainting spells, do not stand or sit up quickly, especially if you are an older patient. Alcohol may increase dizziness and drowsiness. Avoid alcoholic drinks. If you are taking another medicine that also causes drowsiness, you may have more side   effects. Give your health care provider a list of all medicines you use. Your doctor  will tell you how much medicine to take. Do not take more medicine than directed. Call emergency for help if you have problems breathing or unusual sleepiness. The use of this medicine may increase the chance of suicidal thoughts or actions. Pay special attention to how you are responding while on this medicine. Any worsening of mood, or thoughts of suicide or dying should be reported to your health care professional right away. What side effects may I notice from receiving this medicine? Side effects that you should report to your doctor or health care professional as soon as possible: -allergic reactions like skin rash, itching or hives, swelling of the face, lips, or tongue -breathing problems -confusion -loss of balance or coordination -signs and symptoms of low blood pressure like dizziness; feeling faint or lightheaded, falls; unusually weak or tired -suicidal thoughts or mood changes Side effects that usually do not require medical attention (report to your doctor or health care professional if they continue or are bothersome): -dizziness -headache -tiredness -upset stomach This list may not describe all possible side effects. Call your doctor for medical advice about side effects. You may report side effects to FDA at 1-800-FDA-1088. Where should I keep my medicine? Keep out of the reach of children. This medicine can be abused. Keep your medicine in a safe place to protect it from theft. Do not share this medicine with anyone. Selling or giving away this medicine is dangerous and against the law. This medicine may cause accidental overdose and death if taken by other adults, children, or pets. Mix any unused medicine with a substance like cat litter or coffee grounds. Then throw the medicine away in a sealed container like a sealed bag or a coffee can with a lid. Do not use the medicine after the expiration date. Store at room temperature between 15 and 30 degrees C (59 and 86 degrees F).  Protect from light. Keep container tightly closed. NOTE: This sheet is a summary. It may not cover all possible information. If you have questions about this medicine, talk to your doctor, pharmacist, or health care provider.  2019 Elsevier/Gold Standard (2015-10-19 18:46:32) Aripiprazole tablets What is this medicine? ARIPIPRAZOLE (ay ri PIP ray zole) is an atypical antipsychotic. It is used to treat schizophrenia and bipolar disorder, also known as manic-depression. It is also used to treat Tourette's disorder and some symptoms of autism. This medicine may also be used in combination with antidepressants to treat major depressive disorder. This medicine may be used for other purposes; ask your health care provider or pharmacist if you have questions. COMMON BRAND NAME(S): Abilify What should I tell my health care provider before I take this medicine? They need to know if you have any of these conditions: -dehydration -dementia -diabetes -heart disease -history of stroke -low blood counts, like low white cell, platelet, or red cell counts -Parkinson's disease -seizures -suicidal thoughts, plans, or attempt; a previous suicide attempt by you or a family member -an unusual or allergic reaction to aripiprazole, other medicines, foods, dyes, or preservatives -pregnant or trying to get pregnant -breast-feeding How should I use this medicine? Take this medicine by mouth with a glass of water. Follow the directions on the prescription label. You can take this medicine with or without food. Take your doses at regular intervals. Do not take your medicine more often than directed. Do not stop taking except on the advice of  your doctor or health care professional. A special MedGuide will be given to you by the pharmacist with each prescription and refill. Be sure to read this information carefully each time. Talk to your pediatrician regarding the use of this medicine in children. While this drug may  be prescribed for children as young as 666 years of age for selected conditions, precautions do apply. Overdosage: If you think you have taken too much of this medicine contact a poison control center or emergency room at once. NOTE: This medicine is only for you. Do not share this medicine with others. What if I miss a dose? If you miss a dose, take it as soon as you can. If it is almost time for your next dose, take only that dose. Do not take double or extra doses. What may interact with this medicine? Do not take this medicine with any of the following medications: -brexpiprazole -cisapride -dofetilide -dronedarone -metoclopramide -pimozide -thioridazine This medicine may also interact with the following medications: -alcohol -carbamazepine -certain medicines for anxiety or sleep -certain medicines for blood pressure -certain medicines for fungal infections like ketoconazole, fluconazole, posaconazole, and itraconazole -clarithromycin -fluoxetine -other medicines that prolong the QT interval (cause an abnormal heart rhythm) -paroxetine -quinidine -rifampin This list may not describe all possible interactions. Give your health care provider a list of all the medicines, herbs, non-prescription drugs, or dietary supplements you use. Also tell them if you smoke, drink alcohol, or use illegal drugs. Some items may interact with your medicine. What should I watch for while using this medicine? Visit your doctor or health care professional for regular checks on your progress. It may be several weeks before you see the full effects of this medicine. Do not suddenly stop taking this medicine. You may need to gradually reduce the dose. Patients and their families should watch out for worsening depression or thoughts of suicide. Also watch out for sudden changes in feelings such as feeling anxious, agitated, panicky, irritable, hostile, aggressive, impulsive, severely restless, overly excited and  hyperactive, or not being able to sleep. If this happens, especially at the beginning of antidepressant treatment or after a change in dose, call your health care professional. Bonita QuinYou may get dizzy or drowsy. Do not drive, use machinery, or do anything that needs mental alertness until you know how this medicine affects you. Do not stand or sit up quickly, especially if you are an older patient. This reduces the risk of dizzy or fainting spells. Alcohol can increase dizziness and drowsiness. Avoid alcoholic drinks. This medicine can reduce the response of your body to heat or cold. Dress warmly in cold weather and stay hydrated in hot weather. If possible, avoid extreme temperatures like saunas, hot tubs, very hot or cold showers, or activities that can cause dehydration such as vigorous exercise. This medicine may cause dry eyes and blurred vision. If you wear contact lenses, you may feel some discomfort. Lubricating drops may help. See your eye doctor if the problem does not go away or is severe. If you notice an increased hunger or thirst, different from your normal hunger or thirst, or if you find that you have to urinate more frequently, you should contact your health care provider as soon as possible. You may need to have your blood sugar monitored. This medicine may cause changes in your blood sugar levels. You should monitor your blood sugar frequently if you have diabetes. There have been reports of uncontrollable and strong urges to gamble, binge eat, shop,  and have sex while taking this medicine. If you experience any of these or other uncontrollable and strong urges while taking this medicine, you should report it to your health care provider as soon as possible. What side effects may I notice from receiving this medicine? Side effects that you should report to your doctor or health care professional as soon as possible: -allergic reactions like skin rash, itching or hives, swelling of the face,  lips, or tongue -breathing problems -confusion -feeling faint or lightheaded, falls -fever or chills, sore throat -increased hunger or thirst -increased urination -joint pain -muscles pain, spasms -problems with balance, talking, walking -restlessness or need to keep moving -seizures -suicidal thoughts or other mood changes -trouble swallowing -uncontrollable and excessive urges (examples: gambling, binge eating, shopping, having sex) -uncontrollable head, mouth, neck, arm, or leg movements -unusually weak or tired Side effects that usually do not require medical attention (report to your doctor or health care professional if they continue or are bothersome): -blurred vision -constipation -headache -nausea, vomiting -trouble sleeping -weight gain This list may not describe all possible side effects. Call your doctor for medical advice about side effects. You may report side effects to FDA at 1-800-FDA-1088. Where should I keep my medicine? Keep out of the reach of children. Store at room temperature between 15 and 30 degrees C (59 and 86 degrees F). Throw away any unused medicine after the expiration date. NOTE: This sheet is a summary. It may not cover all possible information. If you have questions about this medicine, talk to your doctor, pharmacist, or health care provider.  2019 Elsevier/Gold Standard (2017-11-05 09:13:39)

## 2018-08-12 ENCOUNTER — Encounter: Payer: Self-pay | Admitting: Occupational Therapy

## 2018-08-14 ENCOUNTER — Other Ambulatory Visit: Payer: Self-pay | Admitting: Psychiatry

## 2018-08-14 DIAGNOSIS — F3160 Bipolar disorder, current episode mixed, unspecified: Secondary | ICD-10-CM

## 2018-08-16 ENCOUNTER — Encounter: Payer: Self-pay | Admitting: Occupational Therapy

## 2018-08-17 ENCOUNTER — Encounter: Payer: Self-pay | Admitting: Occupational Therapy

## 2018-08-18 ENCOUNTER — Encounter: Payer: Self-pay | Admitting: Occupational Therapy

## 2018-08-19 ENCOUNTER — Encounter: Payer: Self-pay | Admitting: Occupational Therapy

## 2018-08-23 ENCOUNTER — Encounter: Payer: Self-pay | Admitting: Occupational Therapy

## 2018-08-24 ENCOUNTER — Ambulatory Visit: Payer: BLUE CROSS/BLUE SHIELD

## 2018-08-25 ENCOUNTER — Encounter: Payer: Self-pay | Admitting: Psychiatry

## 2018-08-25 ENCOUNTER — Ambulatory Visit (INDEPENDENT_AMBULATORY_CARE_PROVIDER_SITE_OTHER): Payer: BLUE CROSS/BLUE SHIELD | Admitting: Clinical

## 2018-08-25 ENCOUNTER — Other Ambulatory Visit: Payer: Self-pay

## 2018-08-25 ENCOUNTER — Ambulatory Visit (INDEPENDENT_AMBULATORY_CARE_PROVIDER_SITE_OTHER): Payer: BLUE CROSS/BLUE SHIELD

## 2018-08-25 ENCOUNTER — Ambulatory Visit (INDEPENDENT_AMBULATORY_CARE_PROVIDER_SITE_OTHER): Payer: BLUE CROSS/BLUE SHIELD | Admitting: Psychiatry

## 2018-08-25 DIAGNOSIS — L03119 Cellulitis of unspecified part of limb: Secondary | ICD-10-CM | POA: Diagnosis not present

## 2018-08-25 DIAGNOSIS — F5105 Insomnia due to other mental disorder: Secondary | ICD-10-CM

## 2018-08-25 DIAGNOSIS — F3160 Bipolar disorder, current episode mixed, unspecified: Secondary | ICD-10-CM | POA: Diagnosis not present

## 2018-08-25 DIAGNOSIS — F332 Major depressive disorder, recurrent severe without psychotic features: Secondary | ICD-10-CM | POA: Diagnosis not present

## 2018-08-25 DIAGNOSIS — F410 Panic disorder [episodic paroxysmal anxiety] without agoraphobia: Secondary | ICD-10-CM | POA: Diagnosis not present

## 2018-08-25 MED ORDER — ARIPIPRAZOLE 2 MG PO TABS: 2.0000 mg | ORAL_TABLET | Freq: Every day | ORAL | 0 refills | Status: DC

## 2018-08-25 MED ORDER — PENICILLIN G BENZATHINE 1200000 UNIT/2ML IM SUSP
1.2000 10*6.[IU] | Freq: Once | INTRAMUSCULAR | Status: AC
  Administered 2018-08-25: 1.2 10*6.[IU] via INTRAMUSCULAR

## 2018-08-25 NOTE — Progress Notes (Signed)
Virtual Visit via Telephone Note  I connected with Kristie Hendricks on 08/25/18 at 11:45 AM EDT by telephone and verified that I am speaking with the correct person using two identifiers.   I discussed the limitations, risks, security and privacy concerns of performing an evaluation and management service by telephone and the availability of in person appointments. I also discussed with the patient that there may be a patient responsible charge related to this service. The patient expressed understanding and agreed to proceed.   I discussed the assessment and treatment plan with the patient. The patient was provided an opportunity to ask questions and all were answered. The patient agreed with the plan and demonstrated an understanding of the instructions.   The patient was advised to call back or seek an in-person evaluation if the symptoms worsen or if the condition fails to improve as anticipated.  I provided 15 minutes of non-face-to-face time during this encounter.   Jomarie Longs, MD  [facility] MD  OP Progress Note  08/25/2018 4:30 PM [name]  MRN:  [id number]  Chief Complaint: ' Follow up. Chief Complaint    Follow-up     HPI: Kristie Hendricks is a 33 year old Caucasian female, employed, lives in [place], has a history of bipolar disorder, history of gastric bypass, cellulitis, lymphedema, hypertension was evaluated by phone today.  Patient reports she had significant anxiety symptoms last week.  She reports she went into a panic mood and had to walk out of one of her jobs.  She reports it was a lot of stress for her the past few weeks because of everything that is going on around her. She however reports she feels much better this week.  She thinks her anxiety and mood lability has improved.  She has been tolerating the Abilify well.  Patient denies any suicidality, homicidality or perceptual disturbances.  She continues to be in therapy session with her therapist Charlyne Mom and reports it  is very helpful.  Patient reports sleep and appetite is good. She reports she takes trazodone and the nights that she takes trazodone she sleeps good.  Patient denies any other concerns today.  She reports she wants to stay on the current medication regimen at this time and does not want any changes.   Visit Diagnosis:    ICD-10-CM   1. Mixed bipolar I disorder (HCC) F31.60    Mild  2. Panic attack F41.0   3. Insomnia due to mental condition F51.05 ARIPiprazole (ABILIFY) 2 MG tablet    Past Psychiatric History: I have reviewed past psychiatric history from my progress note on 07/20/2018.  Past trials of medications like risperidone, lithium, Seroquel-made her face go numb, Celexa, Lamictal.  Past Medical History:  Past Medical History:  Diagnosis Date  . Anxiety   . Asthma   . Bipolar disorder (HCC)   . Chickenpox   . Depression   . Frequent headaches   . Hypertension   . Migraines   . Pancreatitis     Past Surgical History:  Procedure Laterality Date  . DG GALL BLADDER  2003  . GASTRIC BYPASS  2016  . KNEE ARTHROSCOPY Left 08/26/2017    Family Psychiatric History: I have reviewed family psychiatric history from my progress note on 07/20/2018.  Family History:  Family History  Problem Relation Age of Onset  . Asthma Mother   . COPD Mother   . Hyperlipidemia Mother   . Hypertension Mother   . Stroke Mother   . Arthritis Father   .  Diabetes Father   . Heart disease Father   . Hyperlipidemia Father   . Hypertension Father   . Kidney disease Father   . Anxiety disorder Father   . Asthma Sister   . Depression Sister   . OCD Sister   . Asthma Brother   . Depression Brother   . Uterine cancer Maternal Grandmother 33    Social History: Reviewed social history from my progress note on 07/20/2018. Social History   Socioeconomic History  . Marital status: Significant Other    Spouse name: Not on file  . Number of children: 0  . Years of education: Not on file   . Highest education level: Associate degree: occupational, Scientist, product/process development, or vocational program  Occupational History  . Not on file  Social Needs  . Financial resource strain: Not hard at all  . Food insecurity:    Worry: Never true    Inability: Never true  . Transportation needs:    Medical: No    Non-medical: No  Tobacco Use  . Smoking status: Never Smoker  . Smokeless tobacco: Never Used  Substance and Sexual Activity  . Alcohol use: Yes    Alcohol/week: 1.0 standard drinks    Types: 1 Glasses of wine per week    Comment: socially   . Drug use: No  . Sexual activity: Yes    Partners: Male    Birth control/protection: I.U.D., Condom  Lifestyle  . Physical activity:    Days per week: 0 days    Minutes per session: 0 min  . Stress: Very much  Relationships  . Social connections:    Talks on phone: Not on file    Gets together: Not on file    Attends religious service: Never    Active member of club or organization: No    Attends meetings of clubs or organizations: Never    Relationship status: Never married  Other Topics Concern  . Not on file  Social History Narrative  . Not on file    Allergies:  Allergies  Allergen Reactions  . Clindamycin Anaphylaxis  . Prednisone Other (See Comments) and Nausea And Vomiting    States any steroids pancreatitis pancreatitis  . Dexamethasone Nausea And Vomiting  . Metformin And Related Other (See Comments)    Severe diarrhea  . Other Nausea And Vomiting    Steroid that starts with a C  - causes Pancreatis   . Morphine Anxiety  . Povidone Iodine Hives and Rash    Metabolic Disorder Labs: Lab Results  Component Value Date   HGBA1C 4.9 10/07/2017   No results found for: PROLACTIN Lab Results  Component Value Date   CHOL 137 12/21/2017   TRIG 113.0 12/21/2017   HDL 46.90 12/21/2017   CHOLHDL 3 12/21/2017   VLDL 22.6 12/21/2017   LDLCALC 67 12/21/2017   Lab Results  Component Value Date   TSH 1.310 10/07/2017     Therapeutic Level Labs: No results found for: LITHIUM No results found for: VALPROATE No components found for:  CBMZ  Current Medications: Current Outpatient Medications  Medication Sig Dispense Refill  . ARIPiprazole (ABILIFY) 2 MG tablet Take 1 tablet (2 mg total) by mouth daily. 90 tablet 0  . clonazePAM (KLONOPIN) 0.5 MG tablet Take 0.5-1 tablets (0.25-0.5 mg total) by mouth as directed. Only for severe anxiety attacks up to 2-3 times a week. 12 tablet 0  . famotidine (PEPCID) 40 MG tablet Take by mouth.    . fexofenadine (ALLEGRA)  180 MG tablet Take 180 mg by mouth daily as needed.     . fluconazole (DIFLUCAN) 150 MG tablet     . hydrochlorothiazide (HYDRODIURIL) 12.5 MG tablet TAKE 1 TABLET(12.5 MG) BY MOUTH DAILY FOR BLOOD PRESSURE 90 tablet 1  . lisinopril (PRINIVIL,ZESTRIL) 10 MG tablet Take by mouth.    . traZODone (DESYREL) 50 MG tablet Take 0.5-1 tablets (25-50 mg total) by mouth at bedtime as needed for sleep. 30 tablet 1   No current facility-administered medications for this visit.      Musculoskeletal: Strength & Muscle Tone: UTA Gait & Station: UTA Patient leans: UTA  Psychiatric Specialty Exam: Review of Systems  Psychiatric/Behavioral: The patient is nervous/anxious.   All other systems reviewed and are negative.   There were no vitals taken for this visit.There is no height or weight on file to calculate BMI.  General Appearance: UTA  Eye Contact:  UTA  Speech:  Clear and Coherent  Volume:  Normal  Mood:  Anxious  Affect:  UTA  Thought Process:  Goal Directed and Descriptions of Associations: Intact  Orientation:  Full (Time, Place, and Person)  Thought Content: Logical   Suicidal Thoughts:  No  Homicidal Thoughts:  No  Memory:  Immediate;   Fair Recent;   Fair Remote;   Fair  Judgement:  Fair  Insight:  Fair  Psychomotor Activity:  UTA  Concentration:  Concentration: Fair and Attention Span: Fair  Recall:  [employer] of Knowledge: Fair   Language: Fair  Akathisia:  No  Handed:  Right  AIMS (if indicated): UTA  Assets:  Communication Skills Desire for Improvement Social Support  ADL's:  Intact  Cognition: WNL  Sleep:  Restless   Screenings: PHQ2-9     Office Visit from 06/22/2018 in [facility] Office Visit from 07/28/2017 in [facility] Office Visit from 05/07/2017 in [facility] Office Visit from 03/26/2017 in [facility] for [facility] Disease  PHQ-2 Total Score  0  PHQ-9 Total Score  9  -  6  -       Assessment and Plan: Kristie Hendricks is a 33 year old Caucasian female, employed, lives in [place], has a history of bipolar disorder, insomnia, history of gastric bypass, cellulitis, lymphedema, hypertension, was evaluated by phone today.  Patient is biologically predisposed given her family history as well as history of trauma.  Patient also has psychosocial stressors of financial struggles, work-related stressors, other health problems.  Patient is anxious about the current pandemic.  She however reports improvement with the medications and will continue psychotherapy sessions.  Plan as noted below.  Plan Bipolar disorder type I mixed-improving Abilify 2 mg p.o. daily  For panic attacks-improving Clonazepam 0.25-0.5 mg as needed for severe anxiety attacks.  She has used it 1-2 times since her last visit in clinic.  She is aware about the risk of being on benzodiazepine therapy and will limit use.  For insomnia- improving Trazodone 25-50 mg p.o. nightly as needed  Pending labs TSH.  Follow-up in clinic in 4 weeks or sooner if needed.  I have spent atleast 15 minutes non face to face with patient today. More than 50 % of the time was spent for psychoeducation and supportive psychotherapy and care coordination.  This note was generated in part or whole with voice recognition software.  Voice recognition is usually quite accurate but there  are transcription errors that can and very often do occur. I apologize for any typographical errors that were not detected and corrected.        Jomarie Longs, MD 08/25/2018, 4:30 PM

## 2018-08-26 DIAGNOSIS — M546 Pain in thoracic spine: Secondary | ICD-10-CM | POA: Diagnosis not present

## 2018-08-26 DIAGNOSIS — M9902 Segmental and somatic dysfunction of thoracic region: Secondary | ICD-10-CM | POA: Diagnosis not present

## 2018-08-26 DIAGNOSIS — M9901 Segmental and somatic dysfunction of cervical region: Secondary | ICD-10-CM | POA: Diagnosis not present

## 2018-08-26 DIAGNOSIS — M542 Cervicalgia: Secondary | ICD-10-CM | POA: Diagnosis not present

## 2018-09-01 ENCOUNTER — Ambulatory Visit: Payer: BLUE CROSS/BLUE SHIELD | Admitting: Clinical

## 2018-09-02 DIAGNOSIS — M9901 Segmental and somatic dysfunction of cervical region: Secondary | ICD-10-CM | POA: Diagnosis not present

## 2018-09-02 DIAGNOSIS — M546 Pain in thoracic spine: Secondary | ICD-10-CM | POA: Diagnosis not present

## 2018-09-02 DIAGNOSIS — M9902 Segmental and somatic dysfunction of thoracic region: Secondary | ICD-10-CM | POA: Diagnosis not present

## 2018-09-02 DIAGNOSIS — M542 Cervicalgia: Secondary | ICD-10-CM | POA: Diagnosis not present

## 2018-09-04 ENCOUNTER — Ambulatory Visit (INDEPENDENT_AMBULATORY_CARE_PROVIDER_SITE_OTHER): Payer: BLUE CROSS/BLUE SHIELD | Admitting: Clinical

## 2018-09-04 DIAGNOSIS — F332 Major depressive disorder, recurrent severe without psychotic features: Secondary | ICD-10-CM

## 2018-09-06 ENCOUNTER — Telehealth: Payer: Self-pay | Admitting: *Deleted

## 2018-09-06 MED ORDER — METRONIDAZOLE 500 MG PO TABS: 500.0000 mg | ORAL_TABLET | Freq: Two times a day (BID) | ORAL | 0 refills | Status: DC

## 2018-09-06 MED ORDER — FLUCONAZOLE 150 MG PO TABS: 150.0000 mg | ORAL_TABLET | Freq: Once | ORAL | 1 refills | Status: AC

## 2018-09-06 NOTE — Telephone Encounter (Signed)
-----   Message from Rozann Lesches, NT sent at 09/06/2018  1:54 PM EDT ----- Regarding: rx refill Patient is having an light discharge with odor including itching, thinking she has yeast with BV requesting rx sent to [facility] on [address]. No STD symptoms .

## 2018-09-08 ENCOUNTER — Ambulatory Visit: Payer: BLUE CROSS/BLUE SHIELD | Admitting: Clinical

## 2018-09-10 DIAGNOSIS — M9902 Segmental and somatic dysfunction of thoracic region: Secondary | ICD-10-CM | POA: Diagnosis not present

## 2018-09-10 DIAGNOSIS — M546 Pain in thoracic spine: Secondary | ICD-10-CM | POA: Diagnosis not present

## 2018-09-10 DIAGNOSIS — M542 Cervicalgia: Secondary | ICD-10-CM | POA: Diagnosis not present

## 2018-09-10 DIAGNOSIS — M9901 Segmental and somatic dysfunction of cervical region: Secondary | ICD-10-CM | POA: Diagnosis not present

## 2018-09-11 ENCOUNTER — Ambulatory Visit (INDEPENDENT_AMBULATORY_CARE_PROVIDER_SITE_OTHER): Payer: BLUE CROSS/BLUE SHIELD | Admitting: Clinical

## 2018-09-11 DIAGNOSIS — F332 Major depressive disorder, recurrent severe without psychotic features: Secondary | ICD-10-CM

## 2018-09-13 ENCOUNTER — Other Ambulatory Visit: Payer: Self-pay

## 2018-09-13 ENCOUNTER — Ambulatory Visit (INDEPENDENT_AMBULATORY_CARE_PROVIDER_SITE_OTHER): Payer: BLUE CROSS/BLUE SHIELD | Admitting: Primary Care

## 2018-09-13 ENCOUNTER — Ambulatory Visit (INDEPENDENT_AMBULATORY_CARE_PROVIDER_SITE_OTHER)
Admission: RE | Admit: 2018-09-13 | Discharge: 2018-09-13 | Disposition: A | Discharge status: Home / Self Care | Payer: BLUE CROSS/BLUE SHIELD | Source: Ambulatory Visit | Attending: Primary Care | Admitting: Primary Care

## 2018-09-13 VITALS — BP 136/84 | HR 74 | Temp 98.3°F | Wt 297.0 lb

## 2018-09-13 DIAGNOSIS — R200 Anesthesia of skin: Secondary | ICD-10-CM

## 2018-09-13 DIAGNOSIS — R202 Paresthesia of skin: Secondary | ICD-10-CM | POA: Insufficient documentation

## 2018-09-13 DIAGNOSIS — G8929 Other chronic pain: Secondary | ICD-10-CM

## 2018-09-13 DIAGNOSIS — M542 Cervicalgia: Secondary | ICD-10-CM

## 2018-09-13 MED ORDER — NAPROXEN 500 MG PO TABS: 500.0000 mg | ORAL_TABLET | Freq: Two times a day (BID) | ORAL | 0 refills | Status: DC

## 2018-09-13 NOTE — Assessment & Plan Note (Signed)
Acute to right upper extremity.  Do suspect some sort of nerve impingement, could be from olecranon vs cervical spine. She cannot take steroids so we will provide her with prescription strength naproxen to take BID x 7-10 days.  Plain films of the cervical spine pending. Consider ortho vs neuro referral if needed.

## 2018-09-13 NOTE — Patient Instructions (Addendum)
Complete xray(s) prior to leaving today. I will notify you of your results once received.  You may take the naproxen twice daily as needed for pain and inflammation. Make sure to eat something with this medication.   Consider wearing a brace to your right arm during work hours as discussed.   It was a pleasure to see you today!

## 2018-09-13 NOTE — Progress Notes (Signed)
Subjective:    Patient ID: Kristie Hendricks, female    DOB: 11/27/1985, 33 y.o.   MRN: [id number]  HPI  Kristie Hendricks is a 33 year old female with a history of hypertension, obesity, PCOS, Carpal tunnel syndrome, sciatica, recurrent cellulitis, lymphedema who presents today with a chief complaint of extremity numbness.  Her pain is located to the right upper extremity to the biceps region which began in late January 2020 after a work out. She's had a lot of pain with movement of her right upper extremity laterally. Feels like she needs to "pop" the area back in place. She endorses falling in mid February 2020, landed to outstretched hands bilaterally, no increased pain after. Her numbness is located to the 3rd and 4th fingers with radiation up to her left elbow. This began several days ago. She went to her chiropractor last week who thought her numbness was from a cervical pinched nerve. She's not noticed improvement from the adjustment last week.   She does have an achy type neck pain nearly everyday, this is chronic as she at a desk for the majority of her day at work. She denies recent injury/trauma. Her numbness is constant, improved with laying down, increased when sitting. She's not tried wearing braces but is using a standing desk at work and tries to avoid compressing her wrists on the keyboard. She's taken Ibuprofen 400 mg BID without much improvement. She cannot take oral and IM steroids due to steroid induced pancreatitis.   Review of Systems  Musculoskeletal: Positive for arthralgias and myalgias.  Skin: Negative for color change.  Neurological: Positive for numbness. Negative for weakness.       Past Medical History:  Diagnosis Date  . Anxiety   . Asthma   . Bipolar disorder (HCC)   . Chickenpox   . Depression   . Frequent headaches   . Hypertension   . Migraines   . Pancreatitis      Social History   Socioeconomic History  . Marital status: Significant Other    Spouse name:  Not on file  . Number of children: 0  . Years of education: Not on file  . Highest education level: Associate degree: occupational, Scientist, product/process developmenttechnical, or vocational program  Occupational History  . Not on file  Social Needs  . Financial resource strain: Not hard at all  . Food insecurity:    Worry: Never true    Inability: Never true  . Transportation needs:    Medical: No    Non-medical: No  Tobacco Use  . Smoking status: Never Smoker  . Smokeless tobacco: Never Used  Substance and Sexual Activity  . Alcohol use: Yes    Alcohol/week: 1.0 standard drinks    Types: 1 Glasses of wine per week    Comment: socially   . Drug use: No  . Sexual activity: Yes    Partners: Male    Birth control/protection: I.U.D., Condom  Lifestyle  . Physical activity:    Days per week: 0 days    Minutes per session: 0 min  . Stress: Very much  Relationships  . Social connections:    Talks on phone: Not on file    Gets together: Not on file    Attends religious service: Never    Active member of club or organization: No    Attends meetings of clubs or organizations: Never    Relationship status: Never married  . Intimate partner violence:    Fear of current or  ex partner: No    Emotionally abused: No    Physically abused: No    Forced sexual activity: No  Other Topics Concern  . Not on file  Social History Narrative  . Not on file    Past Surgical History:  Procedure Laterality Date  . DG GALL BLADDER  2003  . GASTRIC BYPASS  2016  . KNEE ARTHROSCOPY Left 08/26/2017    Family History  Problem Relation Age of Onset  . Asthma Mother   . COPD Mother   . Hyperlipidemia Mother   . Hypertension Mother   . Stroke Mother   . Arthritis Father   . Diabetes Father   . Heart disease Father   . Hyperlipidemia Father   . Hypertension Father   . Kidney disease Father   . Anxiety disorder Father   . Asthma Sister   . Depression Sister   . OCD Sister   . Asthma Brother   . Depression Brother    . Uterine cancer Maternal Grandmother 79    Allergies  Allergen Reactions  . Clindamycin Anaphylaxis  . Prednisone Other (See Comments) and Nausea And Vomiting    States any steroids pancreatitis pancreatitis  . Dexamethasone Nausea And Vomiting  . Metformin And Related Other (See Comments)    Severe diarrhea  . Other Nausea And Vomiting    Steroid that starts with a C  - causes Pancreatis   . Morphine Anxiety  . Povidone Iodine Hives and Rash    Current Outpatient Medications on File Prior to Visit  Medication Sig Dispense Refill  . ARIPiprazole (ABILIFY) 2 MG tablet Take 1 tablet (2 mg total) by mouth daily. 90 tablet 0  . clonazePAM (KLONOPIN) 0.5 MG tablet Take 0.5-1 tablets (0.25-0.5 mg total) by mouth as directed. Only for severe anxiety attacks up to 2-3 times a week. 12 tablet 0  . famotidine (PEPCID) 40 MG tablet Take by mouth.    . fexofenadine (ALLEGRA) 180 MG tablet Take 180 mg by mouth daily as needed.     . fluconazole (DIFLUCAN) 150 MG tablet     . hydrochlorothiazide (HYDRODIURIL) 12.5 MG tablet TAKE 1 TABLET(12.5 MG) BY MOUTH DAILY FOR BLOOD PRESSURE 90 tablet 1  . lisinopril (PRINIVIL,ZESTRIL) 10 MG tablet Take by mouth.    . metroNIDAZOLE (FLAGYL) 500 MG tablet Take 1 tablet (500 mg total) by mouth 2 (two) times daily. 14 tablet 0  . traZODone (DESYREL) 50 MG tablet Take 0.5-1 tablets (25-50 mg total) by mouth at bedtime as needed for sleep. 30 tablet 1   No current facility-administered medications on file prior to visit.     BP 136/84   Pulse 74   Temp 98.3 F (36.8 C) (Oral)   Wt 297 lb (134.7 kg)   SpO2 98%   BMI 46.52 kg/m    Objective:   Physical Exam  Constitutional: She appears well-nourished.  Cardiovascular:  Pulses:      Radial pulses are 2+ on the right side.  Musculoskeletal:     Right shoulder: She exhibits normal range of motion and no tenderness.     Right elbow: She exhibits normal range of motion and no swelling. No  tenderness found.       Arms:     Comments: Pain to right mid anterior biceps with reduction in ROM with pain with abduction of right extremity laterally. 5/5 strength bilaterally.   Neurological:  Positive Phalen's sign, negative Tinel's sign.   Skin: Skin is warm and  dry.           Assessment & Plan:

## 2018-09-13 NOTE — Assessment & Plan Note (Signed)
Upper extremity symptoms could be related to potential cervical spine degeneration.  Check cervical plain films today. Rx for Naproxen 500 mg BID course sent to pharmacy.

## 2018-09-15 ENCOUNTER — Encounter: Payer: Self-pay | Admitting: Family Medicine

## 2018-09-15 ENCOUNTER — Ambulatory Visit (INDEPENDENT_AMBULATORY_CARE_PROVIDER_SITE_OTHER): Payer: BLUE CROSS/BLUE SHIELD

## 2018-09-15 ENCOUNTER — Ambulatory Visit: Payer: BLUE CROSS/BLUE SHIELD | Admitting: Clinical

## 2018-09-15 ENCOUNTER — Ambulatory Visit (INDEPENDENT_AMBULATORY_CARE_PROVIDER_SITE_OTHER): Payer: BLUE CROSS/BLUE SHIELD | Admitting: Family Medicine

## 2018-09-15 ENCOUNTER — Other Ambulatory Visit: Payer: Self-pay

## 2018-09-15 VITALS — BP 114/76 | HR 79 | Temp 98.6°F | Ht 67.0 in | Wt 298.5 lb

## 2018-09-15 DIAGNOSIS — R200 Anesthesia of skin: Secondary | ICD-10-CM

## 2018-09-15 DIAGNOSIS — M5412 Radiculopathy, cervical region: Secondary | ICD-10-CM

## 2018-09-15 DIAGNOSIS — I890 Lymphedema, not elsewhere classified: Secondary | ICD-10-CM | POA: Diagnosis not present

## 2018-09-15 DIAGNOSIS — R202 Paresthesia of skin: Secondary | ICD-10-CM | POA: Diagnosis not present

## 2018-09-15 DIAGNOSIS — M7581 Other shoulder lesions, right shoulder: Secondary | ICD-10-CM | POA: Diagnosis not present

## 2018-09-15 MED ORDER — GABAPENTIN (ONCE-DAILY) 300 MG PO TABS: 1.0000 | ORAL_TABLET | Freq: Three times a day (TID) | ORAL | 2 refills | Status: DC

## 2018-09-15 MED ORDER — PENICILLIN G BENZATHINE 1200000 UNIT/2ML IM SUSP
1.2000 10*6.[IU] | Freq: Once | INTRAMUSCULAR | Status: AC
  Administered 2018-09-15: 11:00:00 1.2 10*6.[IU] via INTRAMUSCULAR

## 2018-09-15 NOTE — Progress Notes (Signed)
Kristie Hendricks presented in the office today, well. Administered 1.2 million U Bicillin IM . Kristie Hendricks Tolerated well, has no questions or concerns today.  Will return as scheduled every 3 weeks.       Kristie Hendricks, [place]

## 2018-09-15 NOTE — Patient Instructions (Signed)

## 2018-09-15 NOTE — Progress Notes (Signed)
Kristie Hendricks , MD Primary Care and Sports Medicine [facility] at [facility] [address] [place], [address] Phone: [phone]  FAX: [phone]  Kristie Hendricks - 33 y.o. female  MRN [id number]  Date of Birth: [birth year]  Visit Date: 09/15/2018  PCP: Doreene Nest, NP  Referred by: Doreene Nest, NP  Chief Complaint  Patient presents with  . Arm Pain    Right  . Numbness   Subjective:   Kristie Hendricks is a 34 y.o. very pleasant female patient who presents with the following:  Pleasant patient of Mrs. Chestine Spore who asked me to see the patient today.  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  BMI is 47.  She presents with a multi-month history of pain with abduction and to a lesser extent with internal range of motion at the shoulder.  She also has a T-shirt distribution of pain.  No prior traumatic shoulder injury, operative intervention or fracture.  She also has some shoulder blade pain as well as some tingling and numbness from the elbow down.  Primary numbness is at the dorsum of the third and fourth digit.  She is not having any strength changes.  She is having some occasional tingling as well.  This is been only going on for about 1 to 2 weeks, and she is not had anything similar in the past.  She is not really tried any significant interventions.  Upper arm pain.  Has had some numbness.   3 and 4 numbness  Abd pain. IROM some.   Fell in Feb, but hurting before that. 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  Patient Active Problem List   Diagnosis Date Noted  . Numbness and tingling 09/13/2018  . Chronic neck pain 09/13/2018  . Epiblepharon 08/11/2018  .  Entropion of right lower eyelid 08/11/2018  . Blepharitis 08/11/2018  . Allergic conjunctivitis 08/11/2018  . Punctate keratitis 08/11/2018  . Edema 07/20/2018  . Cellulitis and abscess of left lower extremity 06/07/2018  . Lower abdominal pain 06/01/2018  . Bronchitis 04/29/2018  . Chronic back pain 03/09/2018  . Preventative health care 03/09/2018  . Skin rash 11/23/2017  . Abnormal Pap smear of cervix 11/06/2017  . Morbid obesity (HCC) 10/28/2016  . Status post gastric bypass for obesity 10/28/2016  . Lymphedema of lower extremity 10/28/2016  . 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  . Bipolar disorder (HCC) 10/28/2016  . Hypertension 10/28/2016  . Polycystic ovary syndrome 10/28/2016  . Weight gain 09/03/2016  . Acute viral pharyngitis 05/01/2016  . Sciatica of right side 02/07/2016  . Trochanteric bursitis of both hips 01/15/2016  . Lymphedema 04/01/2014  . Sacroiliac dysfunction 03/31/2014  . Hyperlipidemia 03/30/2014  . Entropion 01/11/2014  . GERD without esophagitis 12/10/2013  . Paresthesias/numbness 10/20/2013  . Allergic rhinitis 10/16/2013  . Shortness of breath 10/16/2013  . Bipolar I disorder, most recent episode (or current) depressed, severe, specified as with psychotic behavior (HCC) 10/13/2013  . 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 Carpal tunnel syndrome, left 12/16/2012  . Vaginal bleeding 09/14/2012  . Hand pain 08/18/2012  . Leukocytosis 07/13/2012  . Hypokalemia 07/13/2012  .  Elevated lipase 05/26/2012  . Fatigue 07/15/2011    Past Medical History:  Diagnosis Date  . Anxiety   . Asthma   . Bipolar disorder (HCC)   . Chickenpox   . Depression   . Frequent headaches   . Hypertension   . Migraines   . Pancreatitis     Past Surgical History:  Procedure Laterality Date  . DG GALL BLADDER  2003  . GASTRIC BYPASS  2016  . KNEE ARTHROSCOPY Left 08/26/2017    Social History   Socioeconomic History  . Marital status:  Significant Other    Spouse name: Not on file  . Number of children: 0  . Years of education: Not on file  . Highest education level: Associate degree: occupational, Scientist, product/process development, or vocational program  Occupational History  . Not on file  Social Needs  . Financial resource strain: Not hard at all  . Food insecurity:    Worry: Never true    Inability: Never true  . Transportation needs:    Medical: No    Non-medical: No  Tobacco Use  . Smoking status: Never Smoker  . Smokeless tobacco: Never Used  Substance and Sexual Activity  . Alcohol use: Yes    Alcohol/week: 1.0 standard drinks    Types: 1 Glasses of wine per week    Comment: socially   . Drug use: No  . Sexual activity: Yes    Partners: Male    Birth control/protection: I.U.D., Condom  Lifestyle  . Physical activity:    Days per week: 0 days    Minutes per session: 0 min  . Stress: Very much  Relationships  . Social connections:    Talks on phone: Not on file    Gets together: Not on file    Attends religious service: Never    Active member of club or organization: No    Attends meetings of clubs or organizations: Never    Relationship status: Never married  . Intimate partner violence:    Fear of current or ex partner: No    Emotionally abused: No    Physically abused: No    Forced sexual activity: No  Other Topics Concern  . Not on file  Social History Narrative  . Not on file    Family History  Problem Relation Age of Onset  . Asthma Mother   . COPD Mother   . Hyperlipidemia Mother   . Hypertension Mother   . Stroke Mother   . Arthritis Father   . Diabetes Father   . Heart disease Father   . Hyperlipidemia Father   . Hypertension Father   . Kidney disease Father   . Anxiety disorder Father   . Asthma Sister   . Depression Sister   . OCD Sister   . Asthma Brother   . Depression Brother   . Uterine cancer Maternal Grandmother 79    Allergies  Allergen Reactions  . Clindamycin Anaphylaxis   . Prednisone Other (See Comments) and Nausea And Vomiting    States any steroids pancreatitis pancreatitis  . Dexamethasone Nausea And Vomiting  . Metformin And Related Other (See Comments)    Severe diarrhea  . Other Nausea And Vomiting    Steroid that starts with a C  - causes Pancreatis   . Morphine Anxiety  . Povidone Iodine Hives and Rash    Medication list reviewed and updated in full in [facility].  GEN: no acute illness or fever CV: No chest  pain or shortness of breath MSK: detailed above Neuro: neurological signs are described above ROS O/w per HPI  Objective:   BP 114/76   Pulse 79   Temp 98.6 F (37 C) (Oral)   Ht  (1.702 m)   Wt 298 lb 8 oz (135.4 kg)   BMI 46.75 kg/m    GEN: Well-developed,well-nourished,in no acute distress; alert,appropriate and cooperative throughout examination HEENT: Normocephalic and atraumatic without obvious abnormalities. Ears, externally no deformities PULM: Breathing comfortably in no respiratory distress EXT: No clubbing, cyanosis, or edema PSYCH: Normally interactive. Cooperative during the interview. Pleasant. Friendly and conversant. Not anxious or depressed appearing. Normal, full affect.  CERVICAL SPINE EXAM Range of motion: Flexion, extension, lateral bending, and rotation: Minimal loss of motion. Pain with terminal motion: She does have some modest tenderness with terminal flexion of the neck and to a lesser extent rotational movements. Spinous Processes: NT SCM: NT Upper paracervical muscles: Modestly tender. Upper traps: NT C5-T1 sensation and motor: Motor function is entirely intact with a 5/5 strength.  From a sensory standpoint, to soft touch as well as pinprick, the patient has decreased sensation on the right dorsum of her third and fourth digits.  Shoulder: R Inspection: No muscle wasting or winging Ecchymosis/edema: neg  AC joint, scapula, clavicle: NT Cervical spine: NT, full ROM Spurling's:  neg Abduction: full, 5/5 Flexion: full, 5/5 IR, lift-off: 5/5 - 30-35 deg loss of IROM compared to the L ER at neutral: full, 5/5 AC crossover: neg Neer: pos Hawkins: NT Drop Test: neg Empty Can: Neg Supraspinatus insertion: mild-mod T Bicipital groove: NT Speed's: neg Yergason's: neg Sulcus sign: neg Scapular dyskinesis: none  Neuro: Sensation intact Grip 5/5   Radiology: Dg Cervical Spine Complete  Result Date: 09/13/2018 CLINICAL DATA:  33 year old female with chronic cervical spine, right biceps and right upper extremity pain with additional right upper extremity numbness. EXAM: CERVICAL SPINE - COMPLETE 4+ VIEW COMPARISON:  None. FINDINGS: No evidence of fracture or malalignment. No prevertebral soft tissue thickening. No significant degenerative disc disease. However, on the oblique views there is mild narrowing of the right L3-L4 neural foramen. No evidence of lytic or blastic osseous lesion. IMPRESSION: 1. Mild narrowing of the right L3-L4 neural foramen. 2. No evidence of fracture, malalignment or additional degenerative changes. Electronically Signed   By: Malachy Moan M.D.   On: 09/13/2018 12:27   We reviewed face to face. Electronically Signed  By: Hannah Beat, MD On: 09/15/2018 10:18 AM   Assessment and Plan:   Cervical radiculopathy  Numbness and tingling in right hand  Rotator cuff tendinitis, right  Cervical radiculopathy with some numbness at the third and fourth digit on the dorsum.  Based on dermatomal distribution, doubt that this relates to C3-C4 foraminal stenosis.  Cubital tunnel syndrome would be very unlikely.  I gave the patient some self traction maneuvers and self McKenzie cervical protocol from [facility].  I am also going to give her some gabapentin to take for neuropathic pain.  If symptoms persist, then physical therapy with traction would be an appropriate next step.  Right-sided rotator cuff tendinopathy with significant  GIRD (Glenohumeral  Internal Rotational Deficiency).  I suspect that she will do quite a bit better just increasing her posterior capsular range of motion on the right.  I gave her a posterior shoulder rehab program from [place].  I appreciate the opportunity to evaluate this very friendly patient. If you have any question regarding her care or prognosis, do not hesitate  to ask.   Follow-up: Return in about 6 weeks (around 10/27/2018).  Meds ordered this encounter  Medications  . Gabapentin, Once-Daily, 300 MG TABS    Sig: Take 1 tablet by mouth 3 (three) times daily.    Dispense:  90 tablet    Refill:  2   Signed,  Kimmy Totten T. Horace Wishon, MD   Outpatient Encounter Medications as of 09/15/2018  Medication Sig  . ARIPiprazole (ABILIFY) 2 MG tablet Take 1 tablet (2 mg total) by mouth daily.  . clonazePAM (KLONOPIN) 0.5 MG tablet Take 0.5-1 tablets (0.25-0.5 mg total) by mouth as directed. Only for severe anxiety attacks up to 2-3 times a week.  . famotidine (PEPCID) 40 MG tablet Take by mouth.  . fexofenadine (ALLEGRA) 180 MG tablet Take 180 mg by mouth daily as needed.   . fluconazole (DIFLUCAN) 150 MG tablet   . hydrochlorothiazide (HYDRODIURIL) 12.5 MG tablet TAKE 1 TABLET(12.5 MG) BY MOUTH DAILY FOR BLOOD PRESSURE  . lisinopril (PRINIVIL,ZESTRIL) 10 MG tablet Take by mouth.  . metroNIDAZOLE (FLAGYL) 500 MG tablet Take 1 tablet (500 mg total) by mouth 2 (two) times daily.  . naproxen (NAPROSYN) 500 MG tablet Take 1 tablet (500 mg total) by mouth 2 (two) times daily with a meal. For pain.  . traZODone (DESYREL) 50 MG tablet Take 0.5-1 tablets (25-50 mg total) by mouth at bedtime as needed for sleep.  . Gabapentin, Once-Daily, 300 MG TABS Take 1 tablet by mouth 3 (three) times daily.   No facility-administered encounter medications on file as of 09/15/2018.

## 2018-09-18 ENCOUNTER — Ambulatory Visit (INDEPENDENT_AMBULATORY_CARE_PROVIDER_SITE_OTHER): Payer: BLUE CROSS/BLUE SHIELD | Admitting: Clinical

## 2018-09-18 DIAGNOSIS — F332 Major depressive disorder, recurrent severe without psychotic features: Secondary | ICD-10-CM

## 2018-09-22 ENCOUNTER — Ambulatory Visit: Payer: BLUE CROSS/BLUE SHIELD | Admitting: Clinical

## 2018-09-25 ENCOUNTER — Ambulatory Visit (INDEPENDENT_AMBULATORY_CARE_PROVIDER_SITE_OTHER): Payer: BLUE CROSS/BLUE SHIELD | Admitting: Clinical

## 2018-09-25 DIAGNOSIS — F332 Major depressive disorder, recurrent severe without psychotic features: Secondary | ICD-10-CM | POA: Diagnosis not present

## 2018-09-28 ENCOUNTER — Telehealth: Payer: Self-pay

## 2018-09-28 NOTE — Telephone Encounter (Signed)
Pt was seen on 09/15/18; pt is doing exercises but pt feels rt shoulder is worsening instead of improving. Rt shoulder aches continuously and upon movement of shoulder pain increases and is sharp. Rt shoulder feels stiff as well. Pain level now is 7.numbness in back of hand and neck pain feels some better. No fever,cough,SOB,N&V or diarrhea. No travel and no known exposure to + covid or flu. Pt wants to know what to do next. [facility] [place].

## 2018-09-28 NOTE — Telephone Encounter (Signed)
Left message asking pt to call office  °

## 2018-09-28 NOTE — Progress Notes (Signed)
Rohan Juenger T. Glendel Jaggers, MD Primary Care and Sports Medicine [facility] [address] [place], [address] Phone: [phone]  FAX: [phone]  Kristie Hendricks - 33 y.o. female  MRN [id number]  Date of Birth: 12/29/1985  Visit Date: 09/29/2018  PCP: Doreene Nest, NP  Referred by: Doreene Nest, NP  Chief Complaint  Patient presents with  . Shoulder Pain    Right   Subjective:   Kristie Hendricks is a 33 y.o. very pleasant female patient who presents with the following:  33 year old with cervical radiculopathy, shoulder pain, poor shoulder ROM, and Bipolar illness presents in follow-up after calling in with some ongoing symptoms and possibly worse. She d/c her gabapentin -   Her history is also significant for a history of bipolar illness type I with a history of 2 hospitalization for acute mania.  She stopped her Abilify 2 weeks ago, and she has an appointment with her psychiatrist tomorrow.  I saw her before and she was having more radicular symptoms for her neck which is calm down, and she now is having more pain in the shoulder that is waking her up at night and she is having pain with abduction, flexion and internal range of motion.  She is not had any kind of additional trauma or injury since that first evaluation.  Cannot really lift that much and harder time lifting stuff and pain with lifting up and aching all the time.   09/15/2018 Last OV with Hannah Beat, MD  Pleasant patient of Mrs. Chestine Spore who asked me to see the patient today.  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  BMI is 47.  She presents with a multi-month history of pain with abduction and to a lesser extent with internal range of motion at the shoulder.  She also has a T-shirt distribution of pain.  No prior traumatic shoulder injury, operative intervention or fracture.   She also has some shoulder blade pain as well as some tingling  and numbness from the elbow down.  Primary numbness is at the dorsum of the third and fourth digit.  She is not having any strength changes.  She is having some occasional tingling as well.  This is been only going on for about 1 to 2 weeks, and she is not had anything similar in the past.  She is not really tried any significant interventions.   Upper arm pain.  Has had some numbness.   3 and 4 numbness   Abd pain. IROM some.   Fell in Feb, but hurting before that. 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  Patient Active Problem List   Diagnosis Date Noted  . Numbness and tingling 09/13/2018  . Chronic neck pain 09/13/2018  . Epiblepharon 08/11/2018  . Entropion of right lower eyelid 08/11/2018  . Blepharitis 08/11/2018  . Allergic conjunctivitis 08/11/2018  . Punctate keratitis 08/11/2018  . Edema 07/20/2018  . Cellulitis and abscess of left lower extremity 06/07/2018  . Lower abdominal pain 06/01/2018  . Bronchitis 04/29/2018  . Chronic back pain 03/09/2018  . Preventative health care 03/09/2018  .  Skin rash 11/23/2017  . Abnormal Pap smear of cervix 11/06/2017  . Morbid obesity (HCC) 10/28/2016  . Status post gastric bypass for obesity 10/28/2016  . Lymphedema of lower extremity 10/28/2016  . 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  . Bipolar disorder (HCC) 10/28/2016  . Hypertension 10/28/2016  . Polycystic ovary syndrome 10/28/2016  . Weight gain 09/03/2016  . Acute viral pharyngitis 05/01/2016  . Sciatica of right side 02/07/2016  . Trochanteric bursitis of both hips 01/15/2016  . Lymphedema 04/01/2014  . Sacroiliac dysfunction 03/31/2014  . Hyperlipidemia 03/30/2014  . Entropion 01/11/2014  . GERD without  esophagitis 12/10/2013  . Paresthesias/numbness 10/20/2013  . Allergic rhinitis 10/16/2013  . Shortness of breath 10/16/2013  . Bipolar I disorder, most recent episode (or current) depressed, severe, specified as with psychotic behavior (HCC) 10/13/2013  . 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 Carpal tunnel syndrome, left 12/16/2012  . Vaginal bleeding 09/14/2012  . Hand pain 08/18/2012  . Leukocytosis 07/13/2012  . Hypokalemia 07/13/2012  . Elevated lipase 05/26/2012  . Fatigue 07/15/2011    Past Medical History:  Diagnosis Date  . Anxiety   . Asthma   . Bipolar disorder (HCC)   . Chickenpox   . Depression   . Frequent headaches   . Hypertension   . Migraines   . Pancreatitis     Past Surgical History:  Procedure Laterality Date  . DG GALL BLADDER  2003  . GASTRIC BYPASS  2016  . KNEE ARTHROSCOPY Left 08/26/2017    Social History   Socioeconomic History  . Marital status: Significant Other    Spouse name: Not on file  . Number of children: 0  . Years of education: Not on file  . Highest education level: Associate degree: occupational, Scientist, product/process development, or vocational program  Occupational History  . Not on file  Social Needs  . Financial resource strain: Not hard at all  . Food insecurity:    Worry: Never true    Inability: Never true  . Transportation needs:    Medical: No    Non-medical: No  Tobacco Use  . Smoking status: Never Smoker  . Smokeless tobacco: Never Used  Substance and Sexual Activity  . Alcohol use: Yes    Alcohol/week: 1.0 standard drinks    Types: 1 Glasses of wine per week    Comment: socially   . Drug use: No  . Sexual activity: Yes    Partners: Male    Birth control/protection: I.U.D., Condom  Lifestyle  . Physical activity:    Days per week: 0 days    Minutes per session: 0 min  . Stress: Very much  Relationships  . Social connections:    Talks on phone: Not on file    Gets together: Not on file    Attends religious service:  Never    Active member of club or organization: No    Attends meetings of clubs or organizations: Never    Relationship status: Never married  . Intimate partner violence:    Fear of current or ex partner: No    Emotionally abused: No    Physically abused: No    Forced sexual activity: No  Other Topics Concern  . Not on file  Social History Narrative  . Not on file    Family History  Problem Relation Age of Onset  . Asthma Mother   . COPD Mother   . Hyperlipidemia Mother   . Hypertension Mother   . Stroke Mother   .  Arthritis Father   . Diabetes Father   . Heart disease Father   . Hyperlipidemia Father   . Hypertension Father   . Kidney disease Father   . Anxiety disorder Father   . Asthma Sister   . Depression Sister   . OCD Sister   . Asthma Brother   . Depression Brother   . Uterine cancer Maternal Grandmother 79    Allergies  Allergen Reactions  . Clindamycin Anaphylaxis  . Prednisone Other (See Comments) and Nausea And Vomiting    States any steroids pancreatitis pancreatitis  . Dexamethasone Nausea And Vomiting  . Metformin And Related Other (See Comments)    Severe diarrhea  . Other Nausea And Vomiting    Steroid that starts with a C  - causes Pancreatis   . Morphine Anxiety  . Povidone Iodine Hives and Rash    Medication list reviewed and updated in full in [facility].  GEN: no acute illness or fever CV: No chest pain or shortness of breath MSK: detailed above Neuro: neurological signs are described above ROS O/w per HPI  Objective:   BP 122/88   Pulse 77   Temp 98.9 F (37.2 C) (Oral)   Ht  (1.702 m)   Wt 299 lb 4 oz (135.7 kg)   BMI 46.87 kg/m    GEN: WDWN, NAD, Non-toxic, Alert & Oriented x 3 HEENT: Atraumatic, Normocephalic.  Ears and Nose: No external deformity. EXTR: No clubbing/cyanosis/edema NEURO: Normal gait.  PSYCH: Normally interactive. Conversant. Not depressed or anxious appearing.  Calm demeanor.    Shoulder: R and L Inspection: No muscle wasting or winging Ecchymosis/edema: neg  AC joint, scapula, clavicle: NT Cervical spine: NT, full ROM Spurling's: neg ABNORMAL SIDE TESTED: R UNLESS OTHERWISE NOTED, THE CONTRALATERAL SIDE HAS FULL RANGE OF MOTION. Abduction: 5/5, LIMITED TO 170 DEGREES Flexion: 5/5, LIMITED TO 170 DEGNO ROM  IR, lift-off: 5/5. TESTED AT 90 DEGREES OF ABDUCTION, LIMITED TO none ER at neutral:  5/5, TESTED AT 90 DEGREES OF ABDUCTION, LIMITED TO 25 degrees less than the contralateral side AC crossover and compression: PAIN Drop Test: neg Empty Can: neg Supraspinatus insertion: NT Bicipital groove: NT ALL OTHER SPECIAL TESTING EQUIVOCAL GIVEN LOSS OF MOTION Neck ROM is full C5-T1 intact Sensation intact Grip 5/5    Radiology: Dg Cervical Spine Complete  Result Date: 09/13/2018 CLINICAL DATA:  33 year old female with chronic cervical spine, right biceps and right upper extremity pain with additional right upper extremity numbness. EXAM: CERVICAL SPINE - COMPLETE 4+ VIEW COMPARISON:  None. FINDINGS: No evidence of fracture or malalignment. No prevertebral soft tissue thickening. No significant degenerative disc disease. However, on the oblique views there is mild narrowing of the right L3-L4 neural foramen. No evidence of lytic or blastic osseous lesion. IMPRESSION: 1. Mild narrowing of the right L3-L4 neural foramen. 2. No evidence of fracture, malalignment or additional degenerative changes. Electronically Signed   By: Malachy Moan M.D.   On: 09/13/2018 12:27    Assessment and Plan:   Adhesive capsulitis of right shoulder - Plan: Ambulatory referral to Physical Therapy  Rotator cuff tendinitis, right - Plan: Ambulatory referral to Physical Therapy  Cervical radiculopathy  Numbness and tingling in right hand  Morbid obesity (HCC)  Bipolar affective disorder, remission status unspecified (HCC)  >25 minutes spent in face to face time with patient,  >50% spent in counselling or coordination of care: She is starting to develop some secondary adhesive capsulitis in the right shoulder due  to issues from increased pain.  Having pain with terminal range of motion exercises, and I am to have her do some formal physical therapy for teaching and some manipulation to help with range of motion.  Gabapentin for pain with slow titration.  She also has type I bipolar illness with 2 different hospitalizations for severe mania.  She stopped her Abilify 2 weeks ago.  I spent additional amount of time talking to her about this and encouraging her to go back on some mood stabilizers.  She does have psychiatric follow-up tomorrow.  In most circumstances, I would do an intra-articular injection in this case, but with untreated bipolar illness, additionally with a history of pancreatitis with steroids, this should be contraindicated.  Patient Instructions  Gabapentin: Start 1 capsule at night.   Increase after 3 days to twice a day After 3 days, increase to three times a day  If sedation, stay at lower dose.  If still having symptoms, increase to 2 at night. Then can increase to 2 twice a day,  Then 2 three times a day  Will need to call then for higher dose medication  REFERRALS TO SPECIALISTS, SPECIAL TESTS (MRI, CT, ULTRASOUNDS)  MARION or  Anastasiya will help you. ASK CHECK-IN FOR HELP.  Specialist appointment times vary a great deal, based on their schedule / openings. -- Some specialists have very long wait times. (Example. Dermatology)       Follow-up: 6-8 weeks  Meds ordered this encounter  Medications  . gabapentin (NEURONTIN) 100 MG capsule    Sig: Take 1 capsule (100 mg total) by mouth 3 (three) times daily. Titrate up as directed    Dispense:  90 capsule    Refill:  3   Orders Placed This Encounter  Procedures  . Ambulatory referral to Physical Therapy    Signed,  Karleen HampshireSpencer T. Shakendra Griffeth, MD   Outpatient Encounter Medications  as of 09/29/2018  Medication Sig  . ARIPiprazole (ABILIFY) 2 MG tablet Take 1 tablet (2 mg total) by mouth daily.  . clonazePAM (KLONOPIN) 0.5 MG tablet Take 0.5-1 tablets (0.25-0.5 mg total) by mouth as directed. Only for severe anxiety attacks up to 2-3 times a week.  . famotidine (PEPCID) 40 MG tablet Take by mouth.  . fexofenadine (ALLEGRA) 180 MG tablet Take 180 mg by mouth daily as needed.   . fluconazole (DIFLUCAN) 150 MG tablet   . hydrochlorothiazide (HYDRODIURIL) 12.5 MG tablet TAKE 1 TABLET(12.5 MG) BY MOUTH DAILY FOR BLOOD PRESSURE  . lisinopril (PRINIVIL,ZESTRIL) 10 MG tablet Take by mouth.  . metroNIDAZOLE (FLAGYL) 500 MG tablet Take 1 tablet (500 mg total) by mouth 2 (two) times daily.  . naproxen (NAPROSYN) 500 MG tablet Take 1 tablet (500 mg total) by mouth 2 (two) times daily with a meal. For pain.  . traZODone (DESYREL) 50 MG tablet Take 0.5-1 tablets (25-50 mg total) by mouth at bedtime as needed for sleep.  Marland Kitchen. gabapentin (NEURONTIN) 100 MG capsule Take 1 capsule (100 mg total) by mouth 3 (three) times daily. Titrate up as directed  . [DISCONTINUED] Gabapentin, Once-Daily, 300 MG TABS Take 1 tablet by mouth 3 (three) times daily. (Patient not taking: Reported on 09/29/2018)   No facility-administered encounter medications on file as of 09/29/2018.

## 2018-09-28 NOTE — Telephone Encounter (Signed)
Please get the patient to follow up in the office when ok time for her nonurgently.  More options seem likely to open up in short order - shoulder injection an option, physical therapy an option, MRI possibly if she is doing very poorly.

## 2018-09-28 NOTE — Telephone Encounter (Signed)
Patient scheduled appointment on 09/29/18.

## 2018-09-29 ENCOUNTER — Ambulatory Visit: Payer: BLUE CROSS/BLUE SHIELD | Admitting: Clinical

## 2018-09-29 ENCOUNTER — Ambulatory Visit (INDEPENDENT_AMBULATORY_CARE_PROVIDER_SITE_OTHER): Payer: BLUE CROSS/BLUE SHIELD | Admitting: Family Medicine

## 2018-09-29 ENCOUNTER — Other Ambulatory Visit: Payer: Self-pay

## 2018-09-29 ENCOUNTER — Encounter: Payer: Self-pay | Admitting: Family Medicine

## 2018-09-29 VITALS — BP 122/88 | HR 77 | Temp 98.9°F | Ht 67.0 in | Wt 299.2 lb

## 2018-09-29 DIAGNOSIS — R202 Paresthesia of skin: Secondary | ICD-10-CM

## 2018-09-29 DIAGNOSIS — M5412 Radiculopathy, cervical region: Secondary | ICD-10-CM | POA: Diagnosis not present

## 2018-09-29 DIAGNOSIS — R200 Anesthesia of skin: Secondary | ICD-10-CM

## 2018-09-29 DIAGNOSIS — M7581 Other shoulder lesions, right shoulder: Secondary | ICD-10-CM | POA: Diagnosis not present

## 2018-09-29 DIAGNOSIS — M7501 Adhesive capsulitis of right shoulder: Secondary | ICD-10-CM | POA: Diagnosis not present

## 2018-09-29 DIAGNOSIS — F319 Bipolar disorder, unspecified: Secondary | ICD-10-CM

## 2018-09-29 MED ORDER — GABAPENTIN 100 MG PO CAPS: 100.0000 mg | ORAL_CAPSULE | Freq: Three times a day (TID) | ORAL | 3 refills | Status: DC

## 2018-09-29 NOTE — Patient Instructions (Signed)
Gabapentin: Start 1 capsule at night.   Increase after 3 days to twice a day After 3 days, increase to three times a day  If sedation, stay at lower dose.  If still having symptoms, increase to 2 at night. Then can increase to 2 twice a day,  Then 2 three times a day  Will need to call then for higher dose medication  REFERRALS TO SPECIALISTS, SPECIAL TESTS (MRI, CT, ULTRASOUNDS)  MARION or  Anastasiya will help you. ASK CHECK-IN FOR HELP.  Specialist appointment times vary a great deal, based on their schedule / openings. -- Some specialists have very long wait times. (Example. Dermatology)

## 2018-09-30 ENCOUNTER — Ambulatory Visit (INDEPENDENT_AMBULATORY_CARE_PROVIDER_SITE_OTHER): Payer: BLUE CROSS/BLUE SHIELD | Admitting: Psychiatry

## 2018-09-30 ENCOUNTER — Ambulatory Visit: Payer: BLUE CROSS/BLUE SHIELD | Admitting: Psychiatry

## 2018-09-30 ENCOUNTER — Encounter: Payer: Self-pay | Admitting: Psychiatry

## 2018-09-30 DIAGNOSIS — F5105 Insomnia due to other mental disorder: Secondary | ICD-10-CM | POA: Diagnosis not present

## 2018-09-30 DIAGNOSIS — F410 Panic disorder [episodic paroxysmal anxiety] without agoraphobia: Secondary | ICD-10-CM | POA: Diagnosis not present

## 2018-09-30 DIAGNOSIS — F3160 Bipolar disorder, current episode mixed, unspecified: Secondary | ICD-10-CM | POA: Diagnosis not present

## 2018-09-30 NOTE — Progress Notes (Signed)
Virtual Visit via Video Note  I connected with Kristie ReinLisa Hendricks on 09/30/18 at  4:45 PM EDT by a video enabled telemedicine application and verified that I am speaking with the correct person using two identifiers.   I discussed the limitations of evaluation and management by telemedicine and the availability of in person appointments. The patient expressed understanding and agreed to proceed.    I discussed the assessment and treatment plan with the patient. The patient was provided an opportunity to ask questions and all were answered. The patient agreed with the plan and demonstrated an understanding of the instructions.   The patient was advised to call back or seek an in-person evaluation if the symptoms worsen or if the condition fails to improve as anticipated.  BH MD  OP Progress Note  09/30/2018 5:06 PM Kristie Hendricks  MRN:  [id number]  Chief Complaint:  Chief Complaint    Follow-up     HPI: Kristie StanleyLisa is a 33 year old Caucasian female, employed, lives in [place], has a history of bipolar disorder, panic attacks, insomnia, history of gastric bypass, cellulitis, lymphedema, hypertension was evaluated by telemedicine today.  Patient reports she is currently doing well with regards to her mood.  She reports she does not have any significant mood lability.  She denies having any significant anxiety or panic attacks.  She reports she has started eating healthy and has been exercising.  She reports her work is less stressful.  She reports she had stopped taking her Abilify.  She reports she wanted to see how she will do without it.  She also felt like the Abilify was making her gain weight and she did not want that.  She reports she had a recent shoulder injury and was having trouble sleeping.  She however reports that now she is on gabapentin for pain and that has helped.  She is able to sleep better.  She does have trazodone available which she takes as needed.  Patient wants to stay away from  any kind of mood stabilizers like Abilify for now.  She will continue psychotherapy sessions with her therapist Charlyne MomJenna Mendelson. Visit Diagnosis:    ICD-10-CM   1. 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 Panic attack F41.0   3. Insomnia due to mental condition F51.05     Past Psychiatric History: I have reviewed past psychiatric history from my progress note on 07/20/2018.  Past trials of medications like risperidone, lithium, Seroquel-made her face go numb, Celexa, Lamictal, Abilify.  Past Medical History:  Past Medical History:  Diagnosis Date  . Anxiety   . Asthma   . Bipolar disorder (HCC)   . Chickenpox   . Depression   . Frequent headaches   . Hypertension   . Migraines   . Pancreatitis     Past Surgical History:  Procedure Laterality Date  . DG GALL BLADDER  2003  . GASTRIC BYPASS  2016  . KNEE ARTHROSCOPY Left 08/26/2017    Family Psychiatric History: I have reviewed family psychiatric history from my progress note on 07/20/2018.  Family History:  Family History  Problem Relation Age of Onset  . Asthma Mother   . COPD Mother   . Hyperlipidemia Mother   . Hypertension Mother   . Stroke Mother   . Arthritis Father   . Diabetes Father   . Heart disease Father   . Hyperlipidemia Father   . Hypertension Father   . Kidney disease Father   . Anxiety disorder Father   .  Asthma Sister   . Depression Sister   . OCD Sister   . Asthma Brother   . Depression Brother   . Uterine cancer Maternal Grandmother 40    Social History: I have reviewed social history from my progress note on 07/20/2018. Social History   Socioeconomic History  . Marital status: Significant Other    Spouse name: Not on file  . Number of children: 0  . Years of education: Not on file  . Highest education level: Associate degree: occupational, Scientist, product/process development, or vocational program  Occupational History  . Not on file  Social Needs  . Financial resource strain: Not hard at all  . Food  insecurity:    Worry: Never true    Inability: Never true  . Transportation needs:    Medical: No    Non-medical: No  Tobacco Use  . Smoking status: Never Smoker  . Smokeless tobacco: Never Used  Substance and Sexual Activity  . Alcohol use: Yes    Alcohol/week: 1.0 standard drinks    Types: 1 Glasses of wine per week    Comment: socially   . Drug use: No  . Sexual activity: Yes    Partners: Male    Birth control/protection: I.U.D., Condom  Lifestyle  . Physical activity:    Days per week: 0 days    Minutes per session: 0 min  . Stress: Very much  Relationships  . Social connections:    Talks on phone: Not on file    Gets together: Not on file    Attends religious service: Never    Active member of club or organization: No    Attends meetings of clubs or organizations: Never    Relationship status: Never married  Other Topics Concern  . Not on file  Social History Narrative  . Not on file    Allergies:  Allergies  Allergen Reactions  . Clindamycin Anaphylaxis  . Prednisone Other (See Comments) and Nausea And Vomiting    States any steroids pancreatitis pancreatitis  . Dexamethasone Nausea And Vomiting  . Metformin And Related Other (See Comments)    Severe diarrhea  . Other Nausea And Vomiting    Steroid that starts with a C  - causes Pancreatis   . Morphine Anxiety  . Povidone Iodine Hives and Rash    Metabolic Disorder Labs: Lab Results  Component Value Date   HGBA1C 4.9 10/07/2017   No results found for: PROLACTIN Lab Results  Component Value Date   CHOL 137 12/21/2017   TRIG 113.0 12/21/2017   HDL 46.90 12/21/2017   CHOLHDL 3 12/21/2017   VLDL 22.6 12/21/2017   LDLCALC 67 12/21/2017   Lab Results  Component Value Date   TSH 1.310 10/07/2017    Therapeutic Level Labs: No results found for: LITHIUM No results found for: VALPROATE No components found for:  CBMZ  Current Medications: Current Outpatient Medications  Medication Sig  Dispense Refill  . clonazePAM (KLONOPIN) 0.5 MG tablet Take 0.5-1 tablets (0.25-0.5 mg total) by mouth as directed. Only for severe anxiety attacks up to 2-3 times a week. 12 tablet 0  . famotidine (PEPCID) 40 MG tablet Take by mouth.    . fexofenadine (ALLEGRA) 180 MG tablet Take 180 mg by mouth daily as needed.     . fluconazole (DIFLUCAN) 150 MG tablet     . gabapentin (NEURONTIN) 100 MG capsule Take 1 capsule (100 mg total) by mouth 3 (three) times daily. Titrate up as directed 90 capsule 3  .  hydrochlorothiazide (HYDRODIURIL) 12.5 MG tablet TAKE 1 TABLET(12.5 MG) BY MOUTH DAILY FOR BLOOD PRESSURE 90 tablet 1  . lisinopril (PRINIVIL,ZESTRIL) 10 MG tablet Take by mouth.    . metroNIDAZOLE (FLAGYL) 500 MG tablet Take 1 tablet (500 mg total) by mouth 2 (two) times daily. 14 tablet 0  . naproxen (NAPROSYN) 500 MG tablet Take 1 tablet (500 mg total) by mouth 2 (two) times daily with a meal. For pain. 30 tablet 0  . traZODone (DESYREL) 50 MG tablet Take 0.5-1 tablets (25-50 mg total) by mouth at bedtime as needed for sleep. 30 tablet 1   No current facility-administered medications for this visit.      Musculoskeletal: Strength & Muscle Tone: within normal limits Gait & Station: normal Patient leans: N/A  Psychiatric Specialty Exam: Review of Systems  Musculoskeletal: Positive for joint pain.  Psychiatric/Behavioral: The patient is not nervous/anxious.   All other systems reviewed and are negative.   There were no vitals taken for this visit.There is no height or weight on file to calculate BMI.  General Appearance: Casual  Eye Contact:  Fair  Speech:  Normal Rate  Volume:  Normal  Mood:  Euthymic  Affect:  Appropriate  Thought Process:  Goal Directed and Descriptions of Associations: Intact  Orientation:  Full (Time, Place, and Person)  Thought Content: Logical   Suicidal Thoughts:  No  Homicidal Thoughts:  No  Memory:  Immediate;   Fair Recent;   Fair Remote;   Fair   Judgement:  Fair  Insight:  Fair  Psychomotor Activity:  Normal  Concentration:  Concentration: Fair and Attention Span: Fair  Recall:  [employer] of Knowledge: Fair  Language: Fair  Akathisia:  No  Handed:  Right  AIMS (if indicated): NA  Assets:  Communication Skills Desire for Improvement Social Support  ADL's:  Intact  Cognition: WNL  Sleep:  Fair   Screenings: PHQ2-9     Office Visit from 06/22/2018 in [facility] for Infectious Disease Office Visit from 07/28/2017 in [facility] for [facility] Office Visit from 05/07/2017 in [facility] for Infectious Disease Office Visit from 03/26/2017 in [facility] for Infectious Disease  PHQ-2 Total Score  0  PHQ-9 Total Score  9  -  6  -       Assessment and Plan: Myron is a 33 year old Caucasian female, employed, lives in [place], has a history of bipolar disorder, insomnia, history of gastric bypass, cellulitis, lymphedema, hypertension was evaluated by telemedicine today.  Patient is biologically predisposed given her family history as well as history of trauma.  Patient also has psychosocial stressors of financial struggles, work-related problems as well as health issues.  Patient currently is doing well without any medication and wants to stay away from medications for now.  She will continue psychotherapy sessions in the meantime.  She reports her mood is stable at this time.  Plan For bipolar disorder type I mixed-stable Discontinue Abilify for noncompliance. Continue CBT  For panic attacks- stable Continue CBT.  For insomnia-stable Trazodone 25 to 50 mg p.o. nightly as needed  Patient wants to give herself a drug holiday and wants to stay away from any kind of medications for now.  She will reach out to writer if her symptoms worsens or if she wants to restart mood stabilizer.  Follow-up in clinic in 3 months or sooner if needed.  Follow-up  appointment on  August 7 at 10 AM. I have spent atleast 15 minutes non face to face with patient today. More than 50 % of the time was spent for psychoeducation and supportive psychotherapy and care coordination. This note was generated in part or whole with voice recognition software. Voice recognition is usually quite accurate but there are transcription errors that can and very often do occur. I apologize for any typographical errors that were not detected and corrected.         Jomarie Longs, MD 09/30/2018, 5:06 PM

## 2018-10-02 ENCOUNTER — Ambulatory Visit (INDEPENDENT_AMBULATORY_CARE_PROVIDER_SITE_OTHER): Payer: BLUE CROSS/BLUE SHIELD | Admitting: Clinical

## 2018-10-02 DIAGNOSIS — F332 Major depressive disorder, recurrent severe without psychotic features: Secondary | ICD-10-CM

## 2018-10-05 DIAGNOSIS — S46011D Strain of muscle(s) and tendon(s) of the rotator cuff of right shoulder, subsequent encounter: Secondary | ICD-10-CM | POA: Diagnosis not present

## 2018-10-05 DIAGNOSIS — M25611 Stiffness of right shoulder, not elsewhere classified: Secondary | ICD-10-CM | POA: Diagnosis not present

## 2018-10-05 DIAGNOSIS — M7501 Adhesive capsulitis of right shoulder: Secondary | ICD-10-CM | POA: Diagnosis not present

## 2018-10-05 DIAGNOSIS — M7541 Impingement syndrome of right shoulder: Secondary | ICD-10-CM | POA: Diagnosis not present

## 2018-10-06 ENCOUNTER — Ambulatory Visit: Payer: BLUE CROSS/BLUE SHIELD | Admitting: Clinical

## 2018-10-06 ENCOUNTER — Ambulatory Visit (INDEPENDENT_AMBULATORY_CARE_PROVIDER_SITE_OTHER): Payer: BLUE CROSS/BLUE SHIELD

## 2018-10-06 ENCOUNTER — Other Ambulatory Visit: Payer: Self-pay

## 2018-10-06 DIAGNOSIS — I890 Lymphedema, not elsewhere classified: Secondary | ICD-10-CM

## 2018-10-06 MED ORDER — PENICILLIN G BENZATHINE 1200000 UNIT/2ML IM SUSP
1.2000 10*6.[IU] | Freq: Once | INTRAMUSCULAR | Status: AC
  Administered 2018-10-06: 1.2 10*6.[IU] via INTRAMUSCULAR

## 2018-10-06 NOTE — Progress Notes (Signed)
Patient presented to office today  To  received Bicillin 1.30ml IM injection. Patient tolerated well. Patient made aware of next upcoming appointment in 3 weeks.  Valarie Cones, LPN

## 2018-10-07 DIAGNOSIS — M7501 Adhesive capsulitis of right shoulder: Secondary | ICD-10-CM | POA: Diagnosis not present

## 2018-10-07 DIAGNOSIS — S46011D Strain of muscle(s) and tendon(s) of the rotator cuff of right shoulder, subsequent encounter: Secondary | ICD-10-CM | POA: Diagnosis not present

## 2018-10-07 DIAGNOSIS — M7541 Impingement syndrome of right shoulder: Secondary | ICD-10-CM | POA: Diagnosis not present

## 2018-10-07 DIAGNOSIS — M25611 Stiffness of right shoulder, not elsewhere classified: Secondary | ICD-10-CM | POA: Diagnosis not present

## 2018-10-09 ENCOUNTER — Ambulatory Visit (INDEPENDENT_AMBULATORY_CARE_PROVIDER_SITE_OTHER): Payer: BLUE CROSS/BLUE SHIELD | Admitting: Clinical

## 2018-10-09 DIAGNOSIS — F332 Major depressive disorder, recurrent severe without psychotic features: Secondary | ICD-10-CM | POA: Diagnosis not present

## 2018-10-12 DIAGNOSIS — M7541 Impingement syndrome of right shoulder: Secondary | ICD-10-CM | POA: Diagnosis not present

## 2018-10-12 DIAGNOSIS — M25611 Stiffness of right shoulder, not elsewhere classified: Secondary | ICD-10-CM | POA: Diagnosis not present

## 2018-10-12 DIAGNOSIS — S46011D Strain of muscle(s) and tendon(s) of the rotator cuff of right shoulder, subsequent encounter: Secondary | ICD-10-CM | POA: Diagnosis not present

## 2018-10-12 DIAGNOSIS — M7501 Adhesive capsulitis of right shoulder: Secondary | ICD-10-CM | POA: Diagnosis not present

## 2018-10-13 ENCOUNTER — Ambulatory Visit: Payer: BLUE CROSS/BLUE SHIELD | Admitting: Clinical

## 2018-10-16 ENCOUNTER — Ambulatory Visit (INDEPENDENT_AMBULATORY_CARE_PROVIDER_SITE_OTHER): Payer: BLUE CROSS/BLUE SHIELD | Admitting: Clinical

## 2018-10-16 DIAGNOSIS — F332 Major depressive disorder, recurrent severe without psychotic features: Secondary | ICD-10-CM | POA: Diagnosis not present

## 2018-10-19 DIAGNOSIS — M7541 Impingement syndrome of right shoulder: Secondary | ICD-10-CM | POA: Diagnosis not present

## 2018-10-19 DIAGNOSIS — S46011D Strain of muscle(s) and tendon(s) of the rotator cuff of right shoulder, subsequent encounter: Secondary | ICD-10-CM | POA: Diagnosis not present

## 2018-10-19 DIAGNOSIS — M7501 Adhesive capsulitis of right shoulder: Secondary | ICD-10-CM | POA: Diagnosis not present

## 2018-10-19 DIAGNOSIS — M25611 Stiffness of right shoulder, not elsewhere classified: Secondary | ICD-10-CM | POA: Diagnosis not present

## 2018-10-20 ENCOUNTER — Ambulatory Visit: Payer: BLUE CROSS/BLUE SHIELD | Admitting: Clinical

## 2018-10-23 ENCOUNTER — Ambulatory Visit (INDEPENDENT_AMBULATORY_CARE_PROVIDER_SITE_OTHER): Payer: BLUE CROSS/BLUE SHIELD | Admitting: Clinical

## 2018-10-23 DIAGNOSIS — F332 Major depressive disorder, recurrent severe without psychotic features: Secondary | ICD-10-CM

## 2018-10-27 ENCOUNTER — Ambulatory Visit (INDEPENDENT_AMBULATORY_CARE_PROVIDER_SITE_OTHER): Payer: BLUE CROSS/BLUE SHIELD | Admitting: *Deleted

## 2018-10-27 ENCOUNTER — Encounter: Payer: Self-pay | Admitting: Family Medicine

## 2018-10-27 ENCOUNTER — Ambulatory Visit (INDEPENDENT_AMBULATORY_CARE_PROVIDER_SITE_OTHER)
Admission: RE | Admit: 2018-10-27 | Discharge: 2018-10-27 | Disposition: A | Discharge status: Home / Self Care | Payer: BLUE CROSS/BLUE SHIELD | Source: Ambulatory Visit | Attending: Family Medicine | Admitting: Family Medicine

## 2018-10-27 ENCOUNTER — Ambulatory Visit (INDEPENDENT_AMBULATORY_CARE_PROVIDER_SITE_OTHER): Payer: BLUE CROSS/BLUE SHIELD | Admitting: Family Medicine

## 2018-10-27 ENCOUNTER — Other Ambulatory Visit: Payer: Self-pay

## 2018-10-27 ENCOUNTER — Ambulatory Visit: Payer: BLUE CROSS/BLUE SHIELD | Admitting: Clinical

## 2018-10-27 VITALS — BP 112/70 | HR 81 | Temp 98.6°F | Ht 67.0 in | Wt 285.0 lb

## 2018-10-27 DIAGNOSIS — M25311 Other instability, right shoulder: Secondary | ICD-10-CM

## 2018-10-27 DIAGNOSIS — M25511 Pain in right shoulder: Secondary | ICD-10-CM

## 2018-10-27 DIAGNOSIS — Z9884 Bariatric surgery status: Secondary | ICD-10-CM | POA: Diagnosis not present

## 2018-10-27 DIAGNOSIS — I890 Lymphedema, not elsewhere classified: Secondary | ICD-10-CM

## 2018-10-27 DIAGNOSIS — L03119 Cellulitis of unspecified part of limb: Secondary | ICD-10-CM | POA: Diagnosis not present

## 2018-10-27 MED ORDER — PENICILLIN G BENZATHINE 1200000 UNIT/2ML IM SUSP
1.2000 10*6.[IU] | Freq: Once | INTRAMUSCULAR | Status: AC
  Administered 2018-10-27: 1.2 10*6.[IU] via INTRAMUSCULAR

## 2018-10-27 NOTE — Patient Instructions (Signed)
Take a full Klonopin, 1 hour before your MRI  REFERRALS TO SPECIALISTS, SPECIAL TESTS (MRI, CT, ULTRASOUNDS)  MARION or  Anastasiya will help you. ASK CHECK-IN FOR HELP.  Specialist appointment times vary a great deal, based on their schedule / openings. -- Some specialists have very long wait times. (Example. Dermatology)

## 2018-10-27 NOTE — Progress Notes (Signed)
Kristie Cudney T. Jersie Beel, MD Primary Care and Sports Medicine [facility] [address] [place], [id number] Phone: [phone]  FAX: [phone]  Kristie Hendricks - 33 y.o. female  MRN [id number]  Date of Birth: 10/21/1985  Visit Date: 10/27/2018  PCP: Doreene Nestlark, Katherine K, NP  Referred by: Doreene Nestlark, Katherine K, NP  Chief Complaint  Patient presents with  . Follow-up    Right Shoulder   Subjective:   Kristie Hendricks is a 33 y.o. very pleasant female patient who presents with the following:  F/u R shoulder, not doing well with PT.   initially I felt like her pain was more from her neck and she had some restriction of motion in her shoulder.  The range of motion in her shoulder has improved, and she was doing some formal physical therapy, however she is still having extensive amounts of pain with abduction as well as external rotation.  She does have some mechanical clicks and clunks in her shoulder with movement.  She does not have a history of frank dislocation, but she does tell me that she had some chronic instability with some what sounds like subluxation historically.  She does have some weakness in the right shoulder as well.  ROM better, pain with all kinds of movement. Abduction. Reaching overhead.   MR arthrogram  Past Medical History, Surgical History, Social History, Family History, Problem List, Medications, and Allergies have been reviewed and updated if relevant.  Patient Active Problem List   Diagnosis Date Noted  . Numbness and tingling 09/13/2018  . Chronic neck pain 09/13/2018  . Epiblepharon 08/11/2018  . Entropion of right lower eyelid 08/11/2018  . Blepharitis 08/11/2018  . Allergic conjunctivitis 08/11/2018  . Punctate keratitis 08/11/2018  . Edema 07/20/2018  . Cellulitis and abscess of left lower extremity 06/07/2018  . Lower abdominal pain 06/01/2018  . Bronchitis 04/29/2018  . Chronic back pain 03/09/2018  .  Preventative health care 03/09/2018  . Skin rash 11/23/2017  . Abnormal Pap smear of cervix 11/06/2017  . Morbid obesity (HCC) 10/28/2016  . Status post gastric bypass for obesity 10/28/2016  . Lymphedema of lower extremity 10/28/2016  . 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  . Bipolar disorder (HCC) 10/28/2016  . Hypertension 10/28/2016  . Polycystic ovary syndrome 10/28/2016  . Weight gain 09/03/2016  . Acute viral pharyngitis 05/01/2016  . Sciatica of right side 02/07/2016  . Trochanteric bursitis of both hips 01/15/2016  . Lymphedema 04/01/2014  . Sacroiliac dysfunction 03/31/2014  . Hyperlipidemia 03/30/2014  . Entropion 01/11/2014  . GERD without esophagitis 12/10/2013  . Paresthesias/numbness 10/20/2013  . Allergic rhinitis 10/16/2013  . Shortness of breath 10/16/2013  . Bipolar I disorder, most recent episode (or current) depressed, severe, specified as with psychotic behavior (HCC) 10/13/2013  . 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 Carpal tunnel syndrome, left 12/16/2012  . Vaginal bleeding 09/14/2012  . Hand pain 08/18/2012  . Leukocytosis 07/13/2012  . Hypokalemia 07/13/2012  . Elevated lipase 05/26/2012  . Fatigue 07/15/2011    Past Medical History:  Diagnosis Date  . Anxiety   . Asthma   . Bipolar disorder (HCC)   . Chickenpox   . Depression   . Frequent headaches   . Hypertension   . Migraines   . Pancreatitis     Past Surgical History:  Procedure Laterality Date  . DG GALL BLADDER  2003  . GASTRIC BYPASS  2016  . KNEE ARTHROSCOPY Left 08/26/2017  Social History   Socioeconomic History  . Marital status: Significant Other    Spouse name: Not on file  . Number of children: 0  . Years of education: Not on file  . Highest education level: Associate degree: occupational, Scientist, product/process development, or vocational program  Occupational History  . Not on file  Social Needs  . Financial resource strain: Not hard at all  . Food insecurity:     Worry: Never true    Inability: Never true  . Transportation needs:    Medical: No    Non-medical: No  Tobacco Use  . Smoking status: Never Smoker  . Smokeless tobacco: Never Used  Substance and Sexual Activity  . Alcohol use: Yes    Alcohol/week: 1.0 standard drinks    Types: 1 Glasses of wine per week    Comment: socially   . Drug use: No  . Sexual activity: Yes    Partners: Male    Birth control/protection: I.U.D., Condom  Lifestyle  . Physical activity:    Days per week: 0 days    Minutes per session: 0 min  . Stress: Very much  Relationships  . Social connections:    Talks on phone: Not on file    Gets together: Not on file    Attends religious service: Never    Active member of club or organization: No    Attends meetings of clubs or organizations: Never    Relationship status: Never married  . Intimate partner violence:    Fear of current or ex partner: No    Emotionally abused: No    Physically abused: No    Forced sexual activity: No  Other Topics Concern  . Not on file  Social History Narrative  . Not on file    Family History  Problem Relation Age of Onset  . Asthma Mother   . COPD Mother   . Hyperlipidemia Mother   . Hypertension Mother   . Stroke Mother   . Arthritis Father   . Diabetes Father   . Heart disease Father   . Hyperlipidemia Father   . Hypertension Father   . Kidney disease Father   . Anxiety disorder Father   . Asthma Sister   . Depression Sister   . OCD Sister   . Asthma Brother   . Depression Brother   . Uterine cancer Maternal Grandmother 79    Allergies  Allergen Reactions  . Clindamycin Anaphylaxis  . Prednisone Other (See Comments) and Nausea And Vomiting    States any steroids pancreatitis pancreatitis  . Dexamethasone Nausea And Vomiting  . Metformin And Related Other (See Comments)    Severe diarrhea  . Other Nausea And Vomiting    Steroid that starts with a C  - causes Pancreatis   . Morphine Anxiety  .  Povidone Iodine Hives and Rash    Medication list reviewed and updated in full in [place] [facility].  GEN: No fevers, chills. Nontoxic. Primarily MSK c/o today. MSK: Detailed in the HPI GI: tolerating PO intake without difficulty Neuro: No numbness, parasthesias, or tingling associated. Otherwise the pertinent positives of the ROS are noted above.   Objective:   BP 112/70   Pulse 81   Temp 98.6 F (37 C) (Oral)   Ht  (1.702 m)   Wt 285 lb (129.3 kg)   BMI 44.64 kg/m    GEN: WDWN, NAD, Non-toxic, Alert & Oriented x 3 HEENT: Atraumatic, Normocephalic.  Ears and Nose: No external  deformity. EXTR: No clubbing/cyanosis/edema NEURO: Normal gait.  PSYCH: Normally interactive. Conversant. Not depressed or anxious appearing.  Calm demeanor.   Shoulder: R Inspection: No muscle wasting or winging Ecchymosis/edema: neg  AC joint, scapula, clavicle: NT Cervical spine: NT, full ROM Spurling's: neg Abduction: full, 4/5 - notably painful arc Flexion: full, 4+/5 IR, full, lift-off: 5/5 ER at neutral: full, 5/5 AC crossover and compression: neg Neer: neg Hawkins: neg Drop Test: neg Empty Can: pos Supraspinatus insertion: NT Bicipital groove: ttp Speed's: + Yergason's: + Sulcus sign: neg OBRIEN: POS CLUNK: POS Scapular dyskinesis: none C5-T1 intact Sensation intact Grip 5/5   Radiology: Dg Shoulder Right  Result Date: 10/27/2018 CLINICAL DATA:  Right shoulder pain EXAM: RIGHT SHOULDER - 2+ VIEW COMPARISON:  None. FINDINGS: There is no evidence of fracture or dislocation. There is no evidence of arthropathy or other focal bone abnormality. Soft tissues are unremarkable. IMPRESSION: Negative. Electronically Signed   By: Charlett Nose M.D.   On: 10/27/2018 10:22   CLINICAL DATA:  33 year old female with chronic cervical spine, right biceps and right upper extremity pain with additional right upper extremity numbness.  EXAM: CERVICAL SPINE - COMPLETE 4+ VIEW   COMPARISON:  None.  FINDINGS: No evidence of fracture or malalignment. No prevertebral soft tissue thickening. No significant degenerative disc disease. However, on the oblique views there is mild narrowing of the right L3-L4 neural foramen. No evidence of lytic or blastic osseous lesion.  IMPRESSION: 1. Mild narrowing of the right L3-L4 neural foramen. 2. No evidence of fracture, malalignment or additional degenerative changes.   Electronically Signed   By: Malachy Moan M.D.   On: 09/13/2018 12:27  Assessment and Plan:   Instability of right shoulder joint - Plan: DG Shoulder Right, MR SHOULDER RIGHT W CONTRAST, DG FLUORO GUIDED NEEDLE PLC ASPIRATION/INJECTION LOC  Acute pain of right shoulder - Plan: DG Shoulder Right, MR SHOULDER RIGHT W CONTRAST, DG FLUORO GUIDED NEEDLE PLC ASPIRATION/INJECTION LOC  Lymphedema of both lower extremities  Status post gastric bypass for obesity  Level of Medical Decision-Making in this case is Moderate.  Recalcitrant right shoulder pain with a failure of conservative measures for multiple months including formal physical therapy, home rehab, neuropathic pain agents.  She also has a history of gastric bypass with contraindication to NSAIDs as well as a history of multiple bouts of pancreatitis with steroids.  Functional disability currently.  Obtain an MR arthrogram of the right shoulder to evaluate for rotator cuff tear, labral tear, SLAP lesion, and to evaluate the capsular integrity of the right shoulder.  Positive clunk, positive O'Brien's, weakened abduction and positive Jobe test.  I spoke to ID, and she has no active cellulitis and they are comfortable with her getting arthrogram.  Follow-up: based on MR arthrogram  Orders Placed This Encounter  Procedures  . DG Shoulder Right  . MR SHOULDER RIGHT W CONTRAST  . DG FLUORO GUIDED NEEDLE PLC ASPIRATION/INJECTION LOC    Signed,  Johnluke Haugen T. Shivaan Tierno, MD   Outpatient  Encounter Medications as of 10/27/2018  Medication Sig  . clonazePAM (KLONOPIN) 0.5 MG tablet Take 0.5-1 tablets (0.25-0.5 mg total) by mouth as directed. Only for severe anxiety attacks up to 2-3 times a week.  . famotidine (PEPCID) 40 MG tablet Take by mouth.  . fexofenadine (ALLEGRA) 180 MG tablet Take 180 mg by mouth daily as needed.   . fluconazole (DIFLUCAN) 150 MG tablet   . gabapentin (NEURONTIN) 100 MG capsule Take 1 capsule (100 mg total)  by mouth 3 (three) times daily. Titrate up as directed  . hydrochlorothiazide (HYDRODIURIL) 12.5 MG tablet TAKE 1 TABLET(12.5 MG) BY MOUTH DAILY FOR BLOOD PRESSURE  . lisinopril (PRINIVIL,ZESTRIL) 10 MG tablet Take by mouth.  . metroNIDAZOLE (FLAGYL) 500 MG tablet Take 1 tablet (500 mg total) by mouth 2 (two) times daily.  . naproxen (NAPROSYN) 500 MG tablet Take 1 tablet (500 mg total) by mouth 2 (two) times daily with a meal. For pain.  . traZODone (DESYREL) 50 MG tablet Take 0.5-1 tablets (25-50 mg total) by mouth at bedtime as needed for sleep.   No facility-administered encounter medications on file as of 10/27/2018.

## 2018-10-30 ENCOUNTER — Ambulatory Visit: Payer: BLUE CROSS/BLUE SHIELD | Admitting: Clinical

## 2018-10-30 ENCOUNTER — Ambulatory Visit (INDEPENDENT_AMBULATORY_CARE_PROVIDER_SITE_OTHER): Payer: BC Managed Care – PPO | Admitting: Clinical

## 2018-10-30 DIAGNOSIS — F332 Major depressive disorder, recurrent severe without psychotic features: Secondary | ICD-10-CM | POA: Diagnosis not present

## 2018-11-03 ENCOUNTER — Ambulatory Visit (INDEPENDENT_AMBULATORY_CARE_PROVIDER_SITE_OTHER): Payer: BC Managed Care – PPO | Admitting: Family Medicine

## 2018-11-03 ENCOUNTER — Encounter: Payer: Self-pay | Admitting: *Deleted

## 2018-11-03 ENCOUNTER — Ambulatory Visit: Payer: BLUE CROSS/BLUE SHIELD | Admitting: Clinical

## 2018-11-03 ENCOUNTER — Other Ambulatory Visit: Payer: Self-pay

## 2018-11-03 ENCOUNTER — Encounter: Payer: Self-pay | Admitting: Family Medicine

## 2018-11-03 VITALS — BP 121/83 | HR 64 | Wt 285.0 lb

## 2018-11-03 DIAGNOSIS — R4586 Emotional lability: Secondary | ICD-10-CM

## 2018-11-03 DIAGNOSIS — Z3009 Encounter for other general counseling and advice on contraception: Secondary | ICD-10-CM

## 2018-11-03 DIAGNOSIS — Z30430 Encounter for insertion of intrauterine contraceptive device: Secondary | ICD-10-CM | POA: Diagnosis not present

## 2018-11-03 DIAGNOSIS — Z3202 Encounter for pregnancy test, result negative: Secondary | ICD-10-CM

## 2018-11-03 LAB — POCT URINE PREGNANCY: Preg Test, Ur: NEGATIVE

## 2018-11-03 MED ORDER — PARAGARD INTRAUTERINE COPPER IU IUD
INTRAUTERINE_SYSTEM | Freq: Once | INTRAUTERINE | Status: AC
  Administered 2018-11-03: 16:00:00 via INTRAUTERINE

## 2018-11-03 MED ORDER — IBUPROFEN 200 MG PO TABS
800.0000 mg | ORAL_TABLET | Freq: Once | ORAL | Status: AC
  Administered 2018-11-03: 800 mg via ORAL

## 2018-11-03 NOTE — Patient Instructions (Signed)

## 2018-11-03 NOTE — Progress Notes (Addendum)
S: reports needing contraception. Reviewed prior history with LNG IUD (caused mood sx) and cu- IUD with explusion. haivng some worsenign mood sx with menses. Having regular menses monthly  O: BP 121/83   Pulse 64   Wt 285 lb (129.3 kg)   LMP 11/02/2018 (Exact Date)   BMI 44.64 kg/m    Gen: well appearing, NAD HEENT: no scleral icterus CV: RR Lung: Normal WOB Ext: warm well perfused  IUD Insertion Procedure Note Patient identified, informed consent performed, consent signed.   Discussed risks of irregular bleeding, cramping, infection, malpositioning or misplacement of the IUD outside the uterus which may require further procedure such as laparoscopy. Time out was performed.  Urine pregnancy test negative.  Speculum placed in the vagina.  Cervix visualized.  Cleaned with Betadine x 2. NO tenaculum was used Uterus sounded to 8 cm.  IUD placed per manufacturer's recommendations.  Strings trimmed to 3 cm.  good hemostasis noted.  Patient tolerated procedure well.   Patient was given post-procedure instructions.  She was advised to have backup contraception for one week.  Patient was also asked to check IUD strings periodically and follow up in 4 weeks for IUD check.   A/P: 1.Contraceptive Counseling/Mood - discussed options for peri-menstrual mood sx - Recommended following up with pysch MD  2. Encounter for insertion of copper IUD - POCT urine pregnancy - Given ibuprofen

## 2018-11-06 ENCOUNTER — Ambulatory Visit (INDEPENDENT_AMBULATORY_CARE_PROVIDER_SITE_OTHER): Payer: BC Managed Care – PPO | Admitting: Clinical

## 2018-11-06 DIAGNOSIS — F332 Major depressive disorder, recurrent severe without psychotic features: Secondary | ICD-10-CM | POA: Diagnosis not present

## 2018-11-10 ENCOUNTER — Ambulatory Visit: Payer: BLUE CROSS/BLUE SHIELD | Admitting: Clinical

## 2018-11-11 ENCOUNTER — Telehealth: Payer: Self-pay | Admitting: Internal Medicine

## 2018-11-11 NOTE — Telephone Encounter (Signed)
COVID-19 Pre-Screening Questions: ° °Do you currently have a fever (>100 °F), chills or unexplained body aches? No   ° °Are you currently experiencing new cough, shortness of breath, sore throat, runny nose? No   °•  °Have you recently travelled outside the state of [place] in the last 14 days? no °•  °1. Have you been in contact with someone that is currently pending confirmation of Covid19 testing or has been confirmed to have the Covid19 virus?  No  ° °

## 2018-11-13 ENCOUNTER — Ambulatory Visit (INDEPENDENT_AMBULATORY_CARE_PROVIDER_SITE_OTHER): Payer: BC Managed Care – PPO | Admitting: Clinical

## 2018-11-13 DIAGNOSIS — F3171 Bipolar disorder, in partial remission, most recent episode hypomanic: Secondary | ICD-10-CM | POA: Diagnosis not present

## 2018-11-13 DIAGNOSIS — F332 Major depressive disorder, recurrent severe without psychotic features: Secondary | ICD-10-CM

## 2018-11-15 ENCOUNTER — Ambulatory Visit (INDEPENDENT_AMBULATORY_CARE_PROVIDER_SITE_OTHER): Payer: BC Managed Care – PPO | Admitting: Internal Medicine

## 2018-11-15 ENCOUNTER — Ambulatory Visit: Payer: BLUE CROSS/BLUE SHIELD | Admitting: Family Medicine

## 2018-11-15 ENCOUNTER — Encounter: Payer: Self-pay | Admitting: Internal Medicine

## 2018-11-15 ENCOUNTER — Other Ambulatory Visit: Payer: Self-pay

## 2018-11-15 DIAGNOSIS — L03119 Cellulitis of unspecified part of limb: Secondary | ICD-10-CM

## 2018-11-15 MED ORDER — PENICILLIN V POTASSIUM 500 MG PO TABS: 500.0000 mg | ORAL_TABLET | Freq: Four times a day (QID) | ORAL | 5 refills | Status: AC

## 2018-11-15 NOTE — Progress Notes (Signed)
[place] for Infectious Disease  Patient Active Problem List   Diagnosis Date Noted  . 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    Priority: High  . Numbness and tingling 09/13/2018  . Chronic neck pain 09/13/2018  . Epiblepharon 08/11/2018  . Entropion of right lower eyelid 08/11/2018  . Blepharitis 08/11/2018  . Allergic conjunctivitis 08/11/2018  . Punctate keratitis 08/11/2018  . Edema 07/20/2018  . Cellulitis and abscess of left lower extremity 06/07/2018  . Lower abdominal pain 06/01/2018  . Chronic back pain 03/09/2018  . Preventative health care 03/09/2018  . Abnormal Pap smear of cervix 11/06/2017  . Morbid obesity ([place]) 10/28/2016  . Status post gastric bypass for obesity 10/28/2016  . Lymphedema of lower extremity 10/28/2016  . Bipolar disorder ([place]) 10/28/2016  . Hypertension 10/28/2016  . Polycystic ovary syndrome 10/28/2016  . Weight gain 09/03/2016  . Acute viral pharyngitis 05/01/2016  . Sciatica of right side 02/07/2016  . Trochanteric bursitis of both hips 01/15/2016  . Lymphedema 04/01/2014  . Sacroiliac dysfunction 03/31/2014  . Hyperlipidemia 03/30/2014  . Entropion 01/11/2014  . GERD without esophagitis 12/10/2013  . Paresthesias/numbness 10/20/2013  . Allergic rhinitis 10/16/2013  . Shortness of breath 10/16/2013  . Bipolar I disorder, most recent episode (or current) depressed, severe, specified as with psychotic behavior ([place]) 10/13/2013  . 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 Carpal tunnel syndrome, left 12/16/2012  . Vaginal bleeding 09/14/2012  . Hand pain 08/18/2012  . Leukocytosis 07/13/2012  . Hypokalemia 07/13/2012  . Elevated lipase 05/26/2012  . Fatigue 07/15/2011    Patient's Medications  New Prescriptions   PENICILLIN V POTASSIUM (VEETID) 500 MG TABLET    Take 1 tablet (500 mg total) by mouth 4 (four) times daily.  Previous Medications   CLONAZEPAM (KLONOPIN) 0.5 MG TABLET    Take 0.5-1 tablets  (0.25-0.5 mg total) by mouth as directed. Only for severe anxiety attacks up to 2-3 times a week.   FAMOTIDINE (PEPCID) 40 MG TABLET    Take by mouth.   FEXOFENADINE (ALLEGRA) 180 MG TABLET    Take 180 mg by mouth daily as needed.    GABAPENTIN (NEURONTIN) 100 MG CAPSULE    Take 1 capsule (100 mg total) by mouth 3 (three) times daily. Titrate up as directed   HYDROCHLOROTHIAZIDE (HYDRODIURIL) 12.5 MG TABLET    TAKE 1 TABLET(12.5 MG) BY MOUTH DAILY FOR BLOOD PRESSURE   NAPROXEN (NAPROSYN) 500 MG TABLET    Take 1 tablet (500 mg total) by mouth 2 (two) times daily with a meal. For pain.   TRAZODONE (DESYREL) 50 MG TABLET    Take 0.5-1 tablets (25-50 mg total) by mouth at bedtime as needed for sleep.  Modified Medications   No medications on file  Discontinued Medications   FLUCONAZOLE (DIFLUCAN) 150 MG TABLET       LISINOPRIL (PRINIVIL,ZESTRIL) 10 MG TABLET    Take by mouth.   METRONIDAZOLE (FLAGYL) 500 MG TABLET    Take 1 tablet (500 mg total) by mouth 2 (two) times daily.    Subjective: Kristie Hendricks is in for her routine follow-up visit.  At her last visit 6 months ago we decided to start her back on IM penicillin injections every 3 weeks to try to prevent recurrent lower extremity cellulitis that had landed her back in the hospital again.  She was working 2 jobs at that time and had gained weight and was worried about going back to the hospital and  losing her job.  She is now only working one job and has lost 15 pounds.  She has noticed improvement in her lower extremity swelling.  She has been having problems with vaginitis and wants to stop the penicillin injections.  Review of Systems: Review of Systems  Constitutional: Positive for weight loss. Negative for chills, diaphoresis and fever.  Gastrointestinal: Negative for abdominal pain, diarrhea, nausea and vomiting.    Past Medical History:  Diagnosis Date  . Anxiety   . Asthma   . Bipolar disorder (HCC)   . Chickenpox   . Depression   .  Frequent headaches   . Hypertension   . Migraines   . Pancreatitis     Social History   Tobacco Use  . Smoking status: Never Smoker  . Smokeless tobacco: Never Used  Substance Use Topics  . Alcohol use: Yes    Alcohol/week: 1.0 standard drinks    Types: 1 Glasses of wine per week    Comment: socially   . Drug use: No    Family History  Problem Relation Age of Onset  . Asthma Mother   . COPD Mother   . Hyperlipidemia Mother   . Hypertension Mother   . Stroke Mother   . Arthritis Father   . Diabetes Father   . Heart disease Father   . Hyperlipidemia Father   . Hypertension Father   . Kidney disease Father   . Anxiety disorder Father   . Asthma Sister   . Depression Sister   . OCD Sister   . Asthma Brother   . Depression Brother   . Uterine cancer Maternal Grandmother 79    Allergies  Allergen Reactions  . Clindamycin Anaphylaxis  . Prednisone Other (See Comments) and Nausea And Vomiting    States any steroids pancreatitis pancreatitis  . Dexamethasone Nausea And Vomiting  . Metformin And Related Other (See Comments)    Severe diarrhea  . Other Nausea And Vomiting    Steroid that starts with a C  - causes Pancreatis   . Morphine Anxiety  . Povidone Iodine Hives and Rash    Objective: Vitals:   11/15/18 0924  BP: 116/82  Pulse: 74  Temp: 98.5 F (36.9 C)  TempSrc: Oral  SpO2: 96%  Weight: 279 lb (126.6 kg)   Body mass index is 43.7 kg/m.  Physical Exam Constitutional:      Comments: She is in good spirits.  Musculoskeletal:     Right lower leg: No edema.     Left lower leg: No edema.  Skin:    Findings: No erythema.  Psychiatric:        Mood and Affect: Mood normal.     Lab Results    Problem List Items Addressed This Visit      High   Recurrent cellulitis of lower extremity    I will stop her regular IM penicillin injections and switch her back to self-directed therapy with oral penicillin when she has episodes of recurrent  cellulitis.  She will follow-up in 6 months.      Relevant Medications   penicillin v potassium (VEETID) 500 MG tablet       Cliffton AstersJohn Ellyce Lafevers, MD [facility] [phone] pager   [phone] cell 11/15/2018, 9:42 AM

## 2018-11-15 NOTE — Assessment & Plan Note (Signed)
I will stop her regular IM penicillin injections and switch her back to self-directed therapy with oral penicillin when she has episodes of recurrent cellulitis.  She will follow-up in 6 months.

## 2018-11-17 ENCOUNTER — Ambulatory Visit: Payer: BLUE CROSS/BLUE SHIELD | Admitting: Clinical

## 2018-11-20 ENCOUNTER — Ambulatory Visit (INDEPENDENT_AMBULATORY_CARE_PROVIDER_SITE_OTHER): Payer: BC Managed Care – PPO | Admitting: Clinical

## 2018-11-20 DIAGNOSIS — F332 Major depressive disorder, recurrent severe without psychotic features: Secondary | ICD-10-CM

## 2018-11-20 DIAGNOSIS — F419 Anxiety disorder, unspecified: Secondary | ICD-10-CM

## 2018-11-20 DIAGNOSIS — F3171 Bipolar disorder, in partial remission, most recent episode hypomanic: Secondary | ICD-10-CM

## 2018-11-24 ENCOUNTER — Ambulatory Visit: Payer: BLUE CROSS/BLUE SHIELD | Admitting: Clinical

## 2018-11-30 ENCOUNTER — Other Ambulatory Visit: Payer: Self-pay

## 2018-11-30 ENCOUNTER — Ambulatory Visit
Admission: RE | Admit: 2018-11-30 | Discharge: 2018-11-30 | Disposition: A | Discharge status: Home / Self Care | Payer: BC Managed Care – PPO | Source: Ambulatory Visit | Attending: Family Medicine | Admitting: Family Medicine

## 2018-11-30 DIAGNOSIS — M25311 Other instability, right shoulder: Secondary | ICD-10-CM

## 2018-11-30 DIAGNOSIS — M25511 Pain in right shoulder: Secondary | ICD-10-CM

## 2018-11-30 DIAGNOSIS — M67813 Other specified disorders of tendon, right shoulder: Secondary | ICD-10-CM | POA: Diagnosis not present

## 2018-11-30 MED ORDER — IOPAMIDOL (ISOVUE-M 200) INJECTION 41%
12.0000 mL | Freq: Once | INTRAMUSCULAR | Status: AC
  Administered 2018-11-30: 16:00:00 12 mL via INTRA_ARTICULAR

## 2018-12-01 ENCOUNTER — Ambulatory Visit: Payer: BLUE CROSS/BLUE SHIELD | Admitting: Clinical

## 2018-12-02 ENCOUNTER — Telehealth: Payer: Self-pay | Admitting: Primary Care

## 2018-12-02 NOTE — Telephone Encounter (Signed)
This is a patient of K.Clark.  She called today and stated that she had a visit with Dr Lorelei Pont and  he advised her to have an MRI done.  She would like to know if our office has received these results and if she should schedule an appointment to discuss these.   Patient C/B # [phone]

## 2018-12-03 ENCOUNTER — Telehealth: Payer: Self-pay | Admitting: Primary Care

## 2018-12-03 NOTE — Telephone Encounter (Signed)
Patient called and said she hasn't heard back about her results. She's very anxious about the results.

## 2018-12-03 NOTE — Telephone Encounter (Signed)
Opened in error

## 2018-12-03 NOTE — Telephone Encounter (Signed)
Needs to await Dr. Lillie Fragmin interpretation of MRI on Monday.

## 2018-12-04 ENCOUNTER — Ambulatory Visit (INDEPENDENT_AMBULATORY_CARE_PROVIDER_SITE_OTHER): Payer: BC Managed Care – PPO | Admitting: Clinical

## 2018-12-04 ENCOUNTER — Ambulatory Visit: Payer: BC Managed Care – PPO | Admitting: Clinical

## 2018-12-04 DIAGNOSIS — F332 Major depressive disorder, recurrent severe without psychotic features: Secondary | ICD-10-CM | POA: Diagnosis not present

## 2018-12-05 NOTE — Telephone Encounter (Signed)
Reviewed over the phone  Kristie Hendricks, can you help get her a follow-up appointment for Wednesday or Thursday of this week?

## 2018-12-06 NOTE — Telephone Encounter (Signed)
Appointment 9/16 Pt aware

## 2018-12-06 NOTE — Telephone Encounter (Signed)
It appears that patient is scheduled for 12/09/2018 with Dr. Lorelei Pont.

## 2018-12-08 ENCOUNTER — Ambulatory Visit: Payer: BLUE CROSS/BLUE SHIELD | Admitting: Clinical

## 2018-12-09 ENCOUNTER — Encounter: Payer: Self-pay | Admitting: Family Medicine

## 2018-12-09 ENCOUNTER — Ambulatory Visit (INDEPENDENT_AMBULATORY_CARE_PROVIDER_SITE_OTHER): Payer: BC Managed Care – PPO | Admitting: Family Medicine

## 2018-12-09 ENCOUNTER — Other Ambulatory Visit: Payer: Self-pay

## 2018-12-09 ENCOUNTER — Ambulatory Visit: Payer: BC Managed Care – PPO | Admitting: Family Medicine

## 2018-12-09 VITALS — BP 120/80 | HR 72 | Temp 98.5°F | Ht 67.0 in | Wt 284.5 lb

## 2018-12-09 DIAGNOSIS — M7581 Other shoulder lesions, right shoulder: Secondary | ICD-10-CM

## 2018-12-09 DIAGNOSIS — M25511 Pain in right shoulder: Secondary | ICD-10-CM

## 2018-12-09 MED ORDER — METHYLPREDNISOLONE ACETATE 40 MG/ML IJ SUSP
80.0000 mg | Freq: Once | INTRAMUSCULAR | Status: AC
  Administered 2018-12-09: 80 mg via INTRA_ARTICULAR

## 2018-12-09 NOTE — Progress Notes (Signed)
Kristie Seney T. Cambree Hendrix, MD Primary Care and Sports Medicine [facility] [address] [place], [id number] Phone: [phone]  FAX: [phone]  Kristie ReinLisa Hendricks - 33 y.o. female  MRN [id number]  Date of Birth: 03/14/1986  Visit Date: 12/09/2018  PCP: Doreene Nestlark, Katherine K, NP  Referred by: Doreene Nestlark, Katherine K, NP  Chief Complaint  Patient presents with  . Discuss MRI results   Subjective:   Kristie Hendricks is a 33 y.o. very pleasant female patient who presents with the following:  R shhas been doing the shoulder pain, no aching every day still hurts and. t25 program and shoulder loading does hurt.   She is a well-known patient.  I pulled up her MR arthrogram with her, and this was remarkably better than anticipated.  She does have some mild rotator cuff tendinitis, but otherwise no labral pathology and her capsule is intact.  She is also had some posterior muscular pain when her shoulder has been hurting in the neck.  Also has some hand numbness in the Right hand.   Neck pain after she has fallen.   inj r sh  Past Medical History, Surgical History, Social History, Family History, Problem List, Medications, and Allergies have been reviewed and updated if relevant.  Patient Active Problem List   Diagnosis Date Noted  . Numbness and tingling 09/13/2018  . Chronic neck pain 09/13/2018  . Epiblepharon 08/11/2018  . Entropion of right lower eyelid 08/11/2018  . Blepharitis 08/11/2018  . Allergic conjunctivitis 08/11/2018  . Punctate keratitis 08/11/2018  . Edema 07/20/2018  . Cellulitis and abscess of left lower extremity 06/07/2018  . Lower abdominal pain 06/01/2018  . Chronic back pain 03/09/2018  . Preventative health care 03/09/2018  . Abnormal Pap smear of cervix 11/06/2017  . Morbid obesity (HCC) 10/28/2016  . Status post gastric bypass for obesity 10/28/2016  . Lymphedema of lower extremity 10/28/2016  . Recurrent cellulitis of lower  extremity 10/28/2016  . Bipolar disorder (HCC) 10/28/2016  . Hypertension 10/28/2016  . Polycystic ovary syndrome 10/28/2016  . Weight gain 09/03/2016  . Acute viral pharyngitis 05/01/2016  . Sciatica of right side 02/07/2016  . Trochanteric bursitis of both hips 01/15/2016  . Lymphedema 04/01/2014  . Sacroiliac dysfunction 03/31/2014  . Hyperlipidemia 03/30/2014  . Entropion 01/11/2014  . GERD without esophagitis 12/10/2013  . Paresthesias/numbness 10/20/2013  . Allergic rhinitis 10/16/2013  . Shortness of breath 10/16/2013  . Bipolar I disorder, most recent episode (or current) depressed, severe, specified as with psychotic behavior (HCC) 10/13/2013  . 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 Carpal tunnel syndrome, left 12/16/2012  . Vaginal bleeding 09/14/2012  . Hand pain 08/18/2012  . Leukocytosis 07/13/2012  . Hypokalemia 07/13/2012  . Elevated lipase 05/26/2012  . Fatigue 07/15/2011    Past Medical History:  Diagnosis Date  . Anxiety   . Asthma   . Bipolar disorder (HCC)   . Chickenpox   . Depression   . Frequent headaches   . Hypertension   . Migraines   . Pancreatitis     Past Surgical History:  Procedure Laterality Date  . DG GALL BLADDER  2003  . GASTRIC BYPASS  2016  . KNEE ARTHROSCOPY Left 08/26/2017    Social History   Socioeconomic History  . Marital status: Significant Other    Spouse name: Not on file  . Number of children: 0  . Years of education: Not on file  . Highest education level: Associate degree: occupational,  technical, or vocational program  Occupational History  . Not on file  Social Needs  . Financial resource strain: Not hard at all  . Food insecurity    Worry: Never true    Inability: Never true  . Transportation needs    Medical: No    Non-medical: No  Tobacco Use  . Smoking status: Never Smoker  . Smokeless tobacco: Never Used  Substance and Sexual Activity  . Alcohol use: Yes    Alcohol/week: 1.0 standard drinks     Types: 1 Glasses of wine per week    Comment: socially   . Drug use: No  . Sexual activity: Yes    Partners: Male    Birth control/protection: I.U.D., Condom  Lifestyle  . Physical activity    Days per week: 0 days    Minutes per session: 0 min  . Stress: Very much  Relationships  . Social Musicianconnections    Talks on phone: Not on file    Gets together: Not on file    Attends religious service: Never    Active member of club or organization: No    Attends meetings of clubs or organizations: Never    Relationship status: Never married  . Intimate partner violence    Fear of current or ex partner: No    Emotionally abused: No    Physically abused: No    Forced sexual activity: No  Other Topics Concern  . Not on file  Social History Narrative  . Not on file    Family History  Problem Relation Age of Onset  . Asthma Mother   . COPD Mother   . Hyperlipidemia Mother   . Hypertension Mother   . Stroke Mother   . Arthritis Father   . Diabetes Father   . Heart disease Father   . Hyperlipidemia Father   . Hypertension Father   . Kidney disease Father   . Anxiety disorder Father   . Asthma Sister   . Depression Sister   . OCD Sister   . Asthma Brother   . Depression Brother   . Uterine cancer Maternal Grandmother 79    Allergies  Allergen Reactions  . Clindamycin Anaphylaxis  . Prednisone Other (See Comments) and Nausea And Vomiting    States any steroids pancreatitis pancreatitis  . Dexamethasone Nausea And Vomiting  . Metformin And Related Other (See Comments)    Severe diarrhea  . Other Nausea And Vomiting    Steroid that starts with a C  - causes Pancreatis   . Morphine Anxiety  . Povidone Iodine Hives and Rash    Medication list reviewed and updated in full in [facility].  GEN: No fevers, chills. Nontoxic. Primarily MSK c/o today. MSK: Detailed in the HPI GI: tolerating PO intake without difficulty Neuro: No numbness, parasthesias, or tingling  associated. Otherwise the pertinent positives of the ROS are noted above.   Objective:   BP 120/80   Pulse 72   Temp 98.5 F (36.9 C) (Skin)   Ht 5\' 7"  (1.702 m)   Wt 284 lb 8 oz (129 kg)   SpO2 97%   BMI 44.56 kg/m    GEN: Well-developed,well-nourished,in no acute distress; alert,appropriate and cooperative throughout examination HEENT: Normocephalic and atraumatic without obvious abnormalities. Ears, externally no deformities PULM: Breathing comfortably in no respiratory distress EXT: No clubbing, cyanosis, or edema PSYCH: Normally interactive. Cooperative during the interview. Pleasant. Friendly and conversant. Not anxious or depressed appearing. Normal, full affect.  Shoulder:  R Inspection: No muscle wasting or winging Ecchymosis/edema: neg  AC joint, scapula, clavicle: NT Cervical spine: NT, full ROM Spurling's: neg Abduction: full, 5/5 Flexion: full, 5/5 IR, full, lift-off: 5/5 ER at neutral: full, 5/5 AC crossover: neg Neer: pos Hawkins: pos Drop Test: neg Empty Can: pos Supraspinatus insertion: mild-mod T Bicipital groove: NT Speed's: neg Yergason's: neg Sulcus sign: neg Scapular dyskinesis: none C5-T1 intact  Neuro: Sensation intact Grip 5/5   Radiology: Mr Shoulder Right W Contrast  Result Date: 11/30/2018 CLINICAL DATA:  Right shoulder pain status post fall EXAM: MR ARTHROGRAM OF THE RIGHT SHOULDER TECHNIQUE: Multiplanar, multisequence MR imaging of the right shoulder was performed following the administration of intra-articular contrast. CONTRAST:  See Injection Documentation. COMPARISON:  None. FINDINGS: Rotator cuff: Minimal tendinosis of the supraspinatus tendon without a tear. Infraspinatus tendon is intact. Teres minor tendon is intact. Subscapularis tendon is intact. Muscles: No atrophy or fatty replacement of nor abnormal signal within, the muscles of the rotator cuff. Biceps long head: Intact. Bifasciculated long head of the biceps tendon.  Acromioclavicular Joint: Normal acromioclavicular joint. Type II acromion. No subacromial/subdeltoid bursal fluid. Glenohumeral Joint: Intraarticular contrast distending the joint capsule. Normal glenohumeral ligaments. No chondral defect. Labrum: Intact. Bones: No acute osseous abnormality. No aggressive osseous lesion. No fracture or dislocation. IMPRESSION: 1. Minimal tendinosis of the supraspinatus tendon without a tear. 2. Intact labrum. Electronically Signed   By: Elige KoHetal  Patel   On: 11/30/2018 16:16   Dg Fluoro Guided Needle Plc Aspiration/injection Loc  Result Date: 11/30/2018 CLINICAL DATA:  Right shoulder pain. FLUOROSCOPY TIME:  Radiation Exposure Index (as provided by the fluoroscopic device): 1.0 mGy Fluoroscopy Time:  1 second Number of Acquired Images:  0 PROCEDURE: The risks and benefits of the procedure were discussed with the patient, and written informed consent was obtained. The patient stated no history of allergy to contrast media. A formal timeout procedure was performed with the patient according to departmental protocol. The patient was placed supine on the fluoroscopy table and the right glenohumeral joint was identified under fluoroscopy. The skin overlying the right glenohumeral joint was subsequently cleaned with Betadine and a sterile drape was placed over the area of interest. 2 ml 1% Lidocaine was used to anesthetize the skin around the needle insertion site. A 3.5 inch 22 gauge spinal needle was inserted into the right glenohumeral joint under fluoroscopy. 12 ml of gadolinium mixture (0.1 ml of Multihance mixed with 10 ml of Isovue-M 200 contrast and 10 ml of sterile saline) were injected into the right glenohumeral joint. The needle was removed and hemostasis was achieved. The patient was subsequently transferred to MRI for imaging. IMPRESSION: Technically successful right shoulder injection for MRI. Electronically Signed   By: Obie DredgeWilliam T Derry M.D.   On: 11/30/2018 16:30     Assessment and Plan:     ICD-10-CM   1. Right shoulder pain, unspecified chronicity  M25.511 methylPREDNISolone acetate (DEPO-MEDROL) injection 80 mg  2. Rotator cuff tendinitis, right  M75.81    Impingement with rotator cuff tendinitis, improved compared to the prior exam.  At this point home rehab, advance as tolerated, and we will do a subacromial injection today.  Aspiration/Injection Procedure Note Kristie Hendricks 02/01/1986 Date of procedure: 12/09/2018  Procedure: Large Joint Aspiration / Injection of Shoulder, Subacromial, R Indications: Pain  Procedure Details Verbal consent was obtained from the patient. Risks (including rare infection), benefits, and alternatives were explained. Patient prepped with Chloraprep and Ethyl Chloride used for anesthesia. The  subacromial space was injected using the posterior approach. The patient tolerated the procedure well and had decreased pain post injection. No complications. Injection: 8 cc of Lidocaine 1% and 2 mL of Depo-Medrol 40 mg. Needle: 22 gauge, 2 inch needle  Medication: 2 mL of Depo-Medrol 40 mg, equaling Depo-Medrol 80 mg total   Follow-up: Return in about 6 weeks (around 01/20/2019).  Meds ordered this encounter  Medications  . methylPREDNISolone acetate (DEPO-MEDROL) injection 80 mg   No orders of the defined types were placed in this encounter.   Signed,  Maud Deed. Rosendo Couser, MD   Outpatient Encounter Medications as of 12/09/2018  Medication Sig  . clonazePAM (KLONOPIN) 0.5 MG tablet Take 0.5-1 tablets (0.25-0.5 mg total) by mouth as directed. Only for severe anxiety attacks up to 2-3 times a week.  . famotidine (PEPCID) 40 MG tablet Take by mouth.  . fexofenadine (ALLEGRA) 180 MG tablet Take 180 mg by mouth daily as needed.   . gabapentin (NEURONTIN) 100 MG capsule Take 1 capsule (100 mg total) by mouth 3 (three) times daily. Titrate up as directed  . hydrochlorothiazide (HYDRODIURIL) 12.5 MG tablet TAKE 1 TABLET(12.5 MG)  BY MOUTH DAILY FOR BLOOD PRESSURE  . naproxen (NAPROSYN) 500 MG tablet Take 1 tablet (500 mg total) by mouth 2 (two) times daily with a meal. For pain.  Marland Kitchen penicillin v potassium (VEETID) 500 MG tablet Take 1 tablet (500 mg total) by mouth 4 (four) times daily.  . traZODone (DESYREL) 50 MG tablet Take 0.5-1 tablets (25-50 mg total) by mouth at bedtime as needed for sleep.  . [EXPIRED] methylPREDNISolone acetate (DEPO-MEDROL) injection 80 mg    No facility-administered encounter medications on file as of 12/09/2018.

## 2018-12-11 ENCOUNTER — Ambulatory Visit (INDEPENDENT_AMBULATORY_CARE_PROVIDER_SITE_OTHER): Payer: BC Managed Care – PPO | Admitting: Clinical

## 2018-12-11 DIAGNOSIS — F332 Major depressive disorder, recurrent severe without psychotic features: Secondary | ICD-10-CM | POA: Diagnosis not present

## 2018-12-12 ENCOUNTER — Encounter: Payer: Self-pay | Admitting: Family Medicine

## 2018-12-15 ENCOUNTER — Ambulatory Visit: Payer: BLUE CROSS/BLUE SHIELD | Admitting: Clinical

## 2018-12-17 ENCOUNTER — Ambulatory Visit (INDEPENDENT_AMBULATORY_CARE_PROVIDER_SITE_OTHER): Payer: BC Managed Care – PPO | Admitting: Primary Care

## 2018-12-17 ENCOUNTER — Other Ambulatory Visit: Payer: Self-pay

## 2018-12-17 ENCOUNTER — Encounter: Payer: Self-pay | Admitting: Primary Care

## 2018-12-17 VITALS — BP 122/76 | HR 74 | Temp 98.3°F | Ht 67.0 in | Wt 271.2 lb

## 2018-12-17 DIAGNOSIS — R1013 Epigastric pain: Secondary | ICD-10-CM | POA: Insufficient documentation

## 2018-12-17 DIAGNOSIS — R1032 Left lower quadrant pain: Secondary | ICD-10-CM

## 2018-12-17 LAB — COMPREHENSIVE METABOLIC PANEL
ALT: 22 U/L (ref 0–35)
AST: 17 U/L (ref 0–37)
Albumin: 4.6 g/dL (ref 3.5–5.2)
Alkaline Phosphatase: 104 U/L (ref 39–117)
BUN: 18 mg/dL (ref 6–23)
CO2: 27 mEq/L (ref 19–32)
Calcium: 9.7 mg/dL (ref 8.4–10.5)
Chloride: 104 mEq/L (ref 96–112)
Creatinine, Ser: 0.71 mg/dL (ref 0.40–1.20)
GFR: 95.52 mL/min (ref >60.00)
Glucose, Bld: 83 mg/dL (ref 70–99)
Potassium: 4.1 mEq/L (ref 3.5–5.1)
Sodium: 139 mEq/L (ref 135–145)
Total Bilirubin: 1.1 mg/dL (ref 0.2–1.2)
Total Protein: 7.5 g/dL (ref 6.0–8.3)

## 2018-12-17 LAB — CBC WITH DIFFERENTIAL/PLATELET
Basophils Absolute: 0.1 10*3/uL (ref 0.0–0.1)
Basophils Relative: 1.1 % (ref 0.0–3.0)
Eosinophils Absolute: 0.1 10*3/uL (ref 0.0–0.7)
Eosinophils Relative: 1.1 % (ref 0.0–5.0)
HCT: 39.2 % (ref 36.0–46.0)
Hemoglobin: 12.8 g/dL (ref 12.0–15.0)
Lymphocytes Relative: 17.7 % (ref 12.0–46.0)
Lymphs Abs: 2.1 10*3/uL (ref 0.7–4.0)
MCHC: 32.7 g/dL (ref 30.0–36.0)
MCV: 81.8 fl (ref 78.0–100.0)
Monocytes Absolute: 0.8 10*3/uL (ref 0.1–1.0)
Monocytes Relative: 6.6 % (ref 3.0–12.0)
Neutro Abs: 8.9 10*3/uL — ABNORMAL HIGH (ref 1.4–7.7)
Neutrophils Relative %: 73.5 % (ref 43.0–77.0)
Platelets: 344 10*3/uL (ref 150.0–400.0)
RBC: 4.79 Mil/uL (ref 3.87–5.11)
RDW: 15.2 % (ref 11.5–15.5)
WBC: 12.1 10*3/uL — ABNORMAL HIGH (ref 4.0–10.5)

## 2018-12-17 LAB — POC URINALSYSI DIPSTICK (AUTOMATED)
Blood, UA: NEGATIVE
Glucose, UA: NEGATIVE
Ketones, UA: NEGATIVE
Leukocytes, UA: NEGATIVE
Nitrite, UA: NEGATIVE
Protein, UA: NEGATIVE
Spec Grav, UA: 1.015 (ref 1.010–1.025)
Urobilinogen, UA: 0.2 E.U./dL
pH, UA: 6.5 (ref 5.0–8.0)

## 2018-12-17 LAB — LIPASE: Lipase: 24 U/L (ref 11.0–59.0)

## 2018-12-17 NOTE — Assessment & Plan Note (Signed)
Acute for the last six days, history of pancreatitis with steroid injections. Did receive injection 8 days ago.  Exam today stable, she is in no distress. Check labs today including lipase, CBC, CMP. Discussed to start a clear liquid diet. Strict ED precautions provided.  Await labs. She is stable for outpatient treatment.

## 2018-12-17 NOTE — Assessment & Plan Note (Signed)
Acute for 2 months, mostly notices with movement/pelvic pressure.   Exam today overall stable, mild tenderness. UA today negative. Check ultrasound of the left groin for further evaluation of inguinal hernia.  Also consider ovarian vs new IUD to be cause.

## 2018-12-17 NOTE — Progress Notes (Signed)
Subjective:    Patient ID: Kristie Hendricks, female    DOB: 07/05/1985, 33 y.o.   MRN: [id number]  HPI  Ms. Bodkin is a 33 year old female with a history of hypertension, GERD, PCOS, recurrent cellulitis, Bipolar Disorder who presents today with a chief complaint of abdominal pain.  Her pain is located to the epigastric region which began 6 days ago after eating vegetables and string cheese. She then experienced three episodes of vomiting intermittently through that day. Her pain radiated through to her lower back.   She's since continued to experience epigastric discomfort with lower back pain and some nausea without vomiting. Overall she's feeling better and pain is mostly noticeable after her largest meal of the day (dinner).  She received a steroid injection 8 days ago and endorses a history of pancreatitis after steroid injections. This has occurred two times prior.  She also endorses a 2 month history of left upper groin pain which improves after a bowel movement at times. Pain is intermittent, feels dull and "pulling". She had a new IUD placed about one month ago. She denies urinary frequency, dysuria, hematuria, heavy lifting. She has been exercising over the last 1-2 months.  She denies belching, esophageal burning, pain with consumption of liquids.   BP Readings from Last 3 Encounters:  12/17/18 122/76  12/09/18 120/80  11/15/18 116/82     Review of Systems  Constitutional: Negative for fever.  Respiratory: Negative for shortness of breath.   Gastrointestinal: Positive for abdominal pain and nausea. Negative for constipation and diarrhea.       Past Medical History:  Diagnosis Date  . Anxiety   . Asthma   . Bipolar disorder (HCC)   . Chickenpox   . Depression   . Frequent headaches   . Hypertension   . Migraines   . Pancreatitis      Social History   Socioeconomic History  . Marital status: Significant Other    Spouse name: Not on file  . Number of children: 0   . Years of education: Not on file  . Highest education level: Associate degree: occupational, Scientist, product/process developmenttechnical, or vocational program  Occupational History  . Not on file  Social Needs  . Financial resource strain: Not hard at all  . Food insecurity    Worry: Never true    Inability: Never true  . Transportation needs    Medical: No    Non-medical: No  Tobacco Use  . Smoking status: Never Smoker  . Smokeless tobacco: Never Used  Substance and Sexual Activity  . Alcohol use: Yes    Alcohol/week: 1.0 standard drinks    Types: 1 Glasses of wine per week    Comment: socially   . Drug use: No  . Sexual activity: Yes    Partners: Male    Birth control/protection: I.U.D., Condom  Lifestyle  . Physical activity    Days per week: 0 days    Minutes per session: 0 min  . Stress: Very much  Relationships  . Social Musicianconnections    Talks on phone: Not on file    Gets together: Not on file    Attends religious service: Never    Active member of club or organization: No    Attends meetings of clubs or organizations: Never    Relationship status: Never married  . Intimate partner violence    Fear of current or ex partner: No    Emotionally abused: No    Physically abused: No  Forced sexual activity: No  Other Topics Concern  . Not on file  Social History Narrative  . Not on file    Past Surgical History:  Procedure Laterality Date  . DG GALL BLADDER  2003  . GASTRIC BYPASS  2016  . KNEE ARTHROSCOPY Left 08/26/2017    Family History  Problem Relation Age of Onset  . Asthma Mother   . COPD Mother   . Hyperlipidemia Mother   . Hypertension Mother   . Stroke Mother   . Arthritis Father   . Diabetes Father   . Heart disease Father   . Hyperlipidemia Father   . Hypertension Father   . Kidney disease Father   . Anxiety disorder Father   . Asthma Sister   . Depression Sister   . OCD Sister   . Asthma Brother   . Depression Brother   . Uterine cancer Maternal Grandmother 79     Allergies  Allergen Reactions  . Clindamycin Anaphylaxis  . Prednisone Other (See Comments) and Nausea And Vomiting    States any steroids pancreatitis pancreatitis  . Dexamethasone Nausea And Vomiting  . Metformin And Related Other (See Comments)    Severe diarrhea  . Other Nausea And Vomiting    Steroid that starts with a C  - causes Pancreatis   . Morphine Anxiety  . Povidone Iodine Hives and Rash    Current Outpatient Medications on File Prior to Visit  Medication Sig Dispense Refill  . famotidine (PEPCID) 40 MG tablet Take by mouth.    . fexofenadine (ALLEGRA) 180 MG tablet Take 180 mg by mouth daily as needed.     . gabapentin (NEURONTIN) 100 MG capsule Take 1 capsule (100 mg total) by mouth 3 (three) times daily. Titrate up as directed 90 capsule 3  . penicillin v potassium (VEETID) 500 MG tablet Take 1 tablet (500 mg total) by mouth 4 (four) times daily. 20 tablet 5  . traZODone (DESYREL) 50 MG tablet Take 0.5-1 tablets (25-50 mg total) by mouth at bedtime as needed for sleep. 30 tablet 1  . clonazePAM (KLONOPIN) 0.5 MG tablet Take 0.5-1 tablets (0.25-0.5 mg total) by mouth as directed. Only for severe anxiety attacks up to 2-3 times a week. (Patient not taking: Reported on 12/17/2018) 12 tablet 0   No current facility-administered medications on file prior to visit.     BP 122/76   Pulse 74   Temp 98.3 F (36.8 C) (Temporal)   Ht 5\' 7"  (1.702 m)   Wt 271 lb 4 oz (123 kg)   SpO2 97%   BMI 42.48 kg/m    Objective:   Physical Exam  Constitutional: She appears well-nourished.  Neck: Neck supple.  Cardiovascular: Normal rate and regular rhythm.  Respiratory: Effort normal and breath sounds normal.  Skin: Skin is warm and dry.           Assessment & Plan:

## 2018-12-17 NOTE — Patient Instructions (Addendum)
Stop by the lab prior to leaving today. I will notify you of your results once received.   Go on a clear liquid diet until your pain resolves or until I get back in touch regarding labs.  You will be contacted regarding your ultrasound.  Please let us know if you have not been contacted within one week.   It was a pleasure to see you today!   Clear Liquid Diet, Adult A clear liquid diet is a diet that includes only liquids and semi-liquids that you can see through. You do not eat any food on this diet. Most people need to follow this diet for only a short period of time. You may need to follow a clear liquid diet if:  You develop a medical condition right before or after you have surgery.  You were not able to eat food for a long period of time.  You had a condition that gave you nausea, vomiting, or diarrhea.  You are going to have a procedure or exam to look at parts of your digestive system.  You are going to have bowel surgery. The usual goals of this diet are:  To rest the stomach and digestive system as much as possible.  To help you clear the digestive system before a procedure or exam.  To keep you hydrated.  To make sure you get some calories for energy.  To help you return to your normal eating routine. What are tips for following this plan?  A clear liquid is a liquid or semi-liquid, such as gelatin, that you can see through when you hold it up to a light.  A clear liquid diet does not provide all the nutrients that you need. It is important to choose a variety of the liquids or semi-liquids that are allowed on this diet. That way, you will get as many nutrients as possible.  If you are not sure whether you can have certain items, ask your health care provider. If you have difficulty swallowing thin liquids, you will need to thicken them to prevent breathing in (aspiration). What foods should I eat?   Water and flavored water.  Fruit juices that do not have  pulp, such as cranberry juice and apple juice.  Tea and coffee without milk or cream.  Clear bouillon or broth.  Broth-based soups that have been strained.  Flavored gelatin.  Honey.  Sugar water.  Ice or frozen ice pops that do not contain milk, yogurt, fruit pieces, or fruit pulp.  Clear sodas.  Clear sports drinks. The items listed above may not be a complete list of recommended foods and beverages. Contact a dietitian for more information. What food should I avoid?  Juices that have pulp.  Milk.  Cream or cream-based soups.  Yogurt.  Regular foods that are not clear liquids or semi-liquids. The items listed above may not be a complete list of foods and beverages to avoid. Contact a dietitian for more information. Questions to ask your health care provider:  How long do I need to follow this diet?  Are there any medicines that I should change while on this diet? Summary  A clear liquid diet is a diet that includes liquids and semi-liquids that you can see through.  The goal of this diet is to help you recover by resting your digestive system, keeping you hydrated, and providing nutrients.  Avoid liquids with milk, cream, or pulp while on this diet. This information is not intended to replace advice  given to you by your health care provider. Make sure you discuss any questions you have with your health care provider. Document Released: 05/12/2005 Document Revised: 11/02/2017 Document Reviewed: 11/02/2017 Elsevier Patient Education  2020 [employer]

## 2018-12-18 ENCOUNTER — Ambulatory Visit (INDEPENDENT_AMBULATORY_CARE_PROVIDER_SITE_OTHER): Payer: BC Managed Care – PPO | Admitting: Clinical

## 2018-12-18 DIAGNOSIS — F332 Major depressive disorder, recurrent severe without psychotic features: Secondary | ICD-10-CM | POA: Diagnosis not present

## 2018-12-20 NOTE — Addendum Note (Signed)
Addended by: Jacqualin Combes on: 12/20/2018 09:46 AM   Modules accepted: Orders

## 2018-12-21 ENCOUNTER — Ambulatory Visit: Payer: Self-pay | Admitting: Internal Medicine

## 2018-12-22 ENCOUNTER — Ambulatory Visit: Payer: BLUE CROSS/BLUE SHIELD | Admitting: Clinical

## 2018-12-22 ENCOUNTER — Ambulatory Visit
Admission: RE | Admit: 2018-12-22 | Discharge: 2018-12-22 | Disposition: A | Discharge status: Home / Self Care | Payer: BC Managed Care – PPO | Source: Ambulatory Visit | Attending: Primary Care | Admitting: Primary Care

## 2018-12-22 DIAGNOSIS — R590 Localized enlarged lymph nodes: Secondary | ICD-10-CM | POA: Diagnosis not present

## 2018-12-22 DIAGNOSIS — R1032 Left lower quadrant pain: Secondary | ICD-10-CM

## 2018-12-23 ENCOUNTER — Telehealth: Payer: Self-pay

## 2018-12-23 NOTE — Telephone Encounter (Signed)
Spoken and notified patient of Kristie Hendricks comments. Patient verbalized understanding. Patient is agreeable to the CT. Patient stated that she will be calling GYN as suggested as well.

## 2018-12-23 NOTE — Telephone Encounter (Signed)
Since the ultrasound did not show anything obvious that a CT scan would be the next step. If she willing to get a CT?

## 2018-12-23 NOTE — Telephone Encounter (Signed)
Noted, see my chart message. 

## 2018-12-23 NOTE — Telephone Encounter (Signed)
Pt said ni the middle of the night, her pelvic pain was so bad it woke her up. This morning while in the shower she was doubled over with pain. She took 2 Aleve and the pain is making her nauseous. She is asking what she should do. She does not feel well.

## 2018-12-25 ENCOUNTER — Ambulatory Visit (INDEPENDENT_AMBULATORY_CARE_PROVIDER_SITE_OTHER): Payer: BC Managed Care – PPO | Admitting: Clinical

## 2018-12-25 DIAGNOSIS — F419 Anxiety disorder, unspecified: Secondary | ICD-10-CM | POA: Diagnosis not present

## 2018-12-25 DIAGNOSIS — F319 Bipolar disorder, unspecified: Secondary | ICD-10-CM

## 2018-12-25 DIAGNOSIS — F332 Major depressive disorder, recurrent severe without psychotic features: Secondary | ICD-10-CM

## 2018-12-27 ENCOUNTER — Telehealth: Payer: Self-pay | Admitting: Primary Care

## 2018-12-27 ENCOUNTER — Encounter: Payer: Self-pay | Admitting: Family Medicine

## 2018-12-27 ENCOUNTER — Other Ambulatory Visit: Payer: Self-pay

## 2018-12-27 ENCOUNTER — Ambulatory Visit (INDEPENDENT_AMBULATORY_CARE_PROVIDER_SITE_OTHER): Payer: BC Managed Care – PPO | Admitting: Family Medicine

## 2018-12-27 ENCOUNTER — Other Ambulatory Visit: Payer: Self-pay | Admitting: Family Medicine

## 2018-12-27 VITALS — BP 137/80 | HR 61 | Wt 273.0 lb

## 2018-12-27 DIAGNOSIS — Z113 Encounter for screening for infections with a predominantly sexual mode of transmission: Secondary | ICD-10-CM

## 2018-12-27 DIAGNOSIS — R102 Pelvic and perineal pain: Secondary | ICD-10-CM

## 2018-12-27 MED ORDER — DOXYCYCLINE HYCLATE 100 MG PO CAPS: 100.0000 mg | ORAL_CAPSULE | Freq: Two times a day (BID) | ORAL | 0 refills | Status: DC

## 2018-12-27 NOTE — Telephone Encounter (Signed)
Received a call from the patient she is seeing Dr Kennon Rounds GYN today at [place] called her about results of [place] and mentioned a CT Scan. Please call patient back about the CT Scan there is no order for this in Epic.

## 2018-12-27 NOTE — Telephone Encounter (Signed)
See mychart messages

## 2018-12-27 NOTE — Assessment & Plan Note (Signed)
Unclear etiology, check u/s, treat presumptively for PID. Could be diverticulitis, malpositioned IUD, PID, ovarian cyst. R/o pregnancy with HCG, TVUS to check IUD position, tubes and ovaries. Take Naproxen twice daily with Doxy and food.

## 2018-12-27 NOTE — Progress Notes (Signed)
Pelvic pain x 2 months, had a bad day last Thursday and had an ultrasound done day before Pt states is has been worse since IUD placement 11/03/2018 (paragard)  Also has issues with recurrent BV

## 2018-12-27 NOTE — Progress Notes (Signed)
    Subjective:    Patient ID: Kristie Hendricks is a 33 y.o. female presenting with Pelvic Pain  on 12/27/2018  HPI: 2nd IUD Paragard, placed in June. Pain has started prior to this. Reports pain woke her up in the night and had left sided pain and was severe. Also and dizzy and light headed. Denies fever, chills, no bowel issues. Has had some diarrhea. Concerned about pain in ovary. U/s done of the groin, but not the pelvis. Has h/o BV and has had this. On cycle now. Pain is not as severe but still worse than in the beginning. Some vaginal discharge. Felt strings in the right place. Took pregnancy test 2 wks ago and was negative. Cycle now, feels normal.  Review of Systems  Constitutional: Negative for chills and fever.  Respiratory: Negative for shortness of breath.   Cardiovascular: Negative for chest pain.  Gastrointestinal: Negative for abdominal pain, nausea and vomiting.  Genitourinary: Negative for dysuria.  Skin: Negative for rash.      Objective:    BP 137/80   Pulse 61   Wt 273 lb (123.8 kg)   BMI 42.76 kg/m  Physical Exam Constitutional:      General: She is not in acute distress.    Appearance: She is well-developed.  HENT:     Head: Normocephalic and atraumatic.  Eyes:     General: No scleral icterus. Neck:     Musculoskeletal: Neck supple.  Cardiovascular:     Rate and Rhythm: Normal rate.  Pulmonary:     Effort: Pulmonary effort is normal.  Abdominal:     Palpations: Abdomen is soft.     Tenderness: There is abdominal tenderness (LLQ). There is rebound (slight).  Genitourinary:    Comments: BUS normal, vagina is pink and rugated, cervix is parous without lesion, IUD strings noted, uterus is small and anteverted, no right adnexal mass or tenderness. Exam limited by body habitus, left adnexal tenderness  Skin:    General: Skin is warm and dry.  Neurological:     Mental Status: She is alert and oriented to person, place, and time.         Assessment &  Plan:   Problem List Items Addressed This Visit      Unprioritized   Pelvic pain - Primary    Unclear etiology, check u/s, treat presumptively for PID. Could be diverticulitis, malpositioned IUD, PID, ovarian cyst. R/o pregnancy with HCG, TVUS to check IUD position, tubes and ovaries. Take Naproxen twice daily with Doxy and food.       Relevant Medications   doxycycline (VIBRAMYCIN) 100 MG capsule   Other Relevant Orders   Cervicovaginal ancillary only( [place])   US PELVIS TRANSVAGINAL NON-OB (TV ONLY)   B-HCG Quant      Total face-to-face time with patient: 15 minutes. Over 50% of encounter was spent on counseling and coordination of care. Return in about 4 days (around 12/31/2018) for a follow-up.  Donnamae Jude 12/27/2018 3:16 PM

## 2018-12-28 ENCOUNTER — Other Ambulatory Visit: Payer: Self-pay

## 2018-12-28 ENCOUNTER — Emergency Department
Admission: EM | Admit: 2018-12-28 | Discharge: 2018-12-28 | Disposition: A | Discharge status: Home / Self Care | Payer: BC Managed Care – PPO | Attending: Emergency Medicine | Admitting: Emergency Medicine

## 2018-12-28 ENCOUNTER — Encounter: Payer: Self-pay | Admitting: Emergency Medicine

## 2018-12-28 ENCOUNTER — Emergency Department: Payer: BC Managed Care – PPO

## 2018-12-28 DIAGNOSIS — R110 Nausea: Secondary | ICD-10-CM | POA: Insufficient documentation

## 2018-12-28 DIAGNOSIS — J45909 Unspecified asthma, uncomplicated: Secondary | ICD-10-CM | POA: Insufficient documentation

## 2018-12-28 DIAGNOSIS — T8332XA Displacement of intrauterine contraceptive device, initial encounter: Secondary | ICD-10-CM | POA: Insufficient documentation

## 2018-12-28 DIAGNOSIS — R1032 Left lower quadrant pain: Secondary | ICD-10-CM | POA: Diagnosis not present

## 2018-12-28 DIAGNOSIS — I10 Essential (primary) hypertension: Secondary | ICD-10-CM | POA: Insufficient documentation

## 2018-12-28 DIAGNOSIS — Y69 Unspecified misadventure during surgical and medical care: Secondary | ICD-10-CM | POA: Insufficient documentation

## 2018-12-28 DIAGNOSIS — Z79899 Other long term (current) drug therapy: Secondary | ICD-10-CM | POA: Diagnosis not present

## 2018-12-28 DIAGNOSIS — T8339XA Other mechanical complication of intrauterine contraceptive device, initial encounter: Secondary | ICD-10-CM | POA: Diagnosis not present

## 2018-12-28 LAB — CBC
HCT: 39.4 % (ref 36.0–46.0)
Hemoglobin: 13 g/dL (ref 12.0–15.0)
MCH: 26.7 pg (ref 26.0–34.0)
MCHC: 33 g/dL (ref 30.0–36.0)
MCV: 81.1 fL (ref 80.0–100.0)
Platelets: 361 10*3/uL (ref 150–400)
RBC: 4.86 MIL/uL (ref 3.87–5.11)
RDW: 14.4 % (ref 11.5–15.5)
WBC: 9.8 10*3/uL (ref 4.0–10.5)
nRBC: 0 % (ref 0.0–0.2)

## 2018-12-28 LAB — COMPREHENSIVE METABOLIC PANEL
ALT: 31 U/L (ref 0–44)
AST: 24 U/L (ref 15–41)
Albumin: 4.3 g/dL (ref 3.5–5.0)
Alkaline Phosphatase: 100 U/L (ref 38–126)
Anion gap: 9 (ref 5–15)
BUN: 17 mg/dL (ref 6–20)
CO2: 24 mmol/L (ref 22–32)
Calcium: 9.3 mg/dL (ref 8.9–10.3)
Chloride: 105 mmol/L (ref 98–111)
Creatinine, Ser: 0.62 mg/dL (ref 0.44–1.00)
GFR calc Af Amer: >60 mL/min (ref >60)
GFR calc non Af Amer: >60 mL/min (ref >60)
Glucose, Bld: 97 mg/dL (ref 70–99)
Potassium: 4.2 mmol/L (ref 3.5–5.1)
Sodium: 138 mmol/L (ref 135–145)
Total Bilirubin: 1.1 mg/dL (ref 0.3–1.2)
Total Protein: 7.7 g/dL (ref 6.5–8.1)

## 2018-12-28 LAB — URINALYSIS, COMPLETE (UACMP) WITH MICROSCOPIC
Bacteria, UA: NONE SEEN
Bilirubin Urine: NEGATIVE
Glucose, UA: NEGATIVE mg/dL
Ketones, ur: NEGATIVE mg/dL
Leukocytes,Ua: NEGATIVE
Nitrite: NEGATIVE
Protein, ur: NEGATIVE mg/dL
Specific Gravity, Urine: 1.005 (ref 1.005–1.030)
pH: 6 (ref 5.0–8.0)

## 2018-12-28 LAB — BETA HCG QUANT (REF LAB): hCG Quant: <1 m[IU]/mL

## 2018-12-28 LAB — LIPASE, BLOOD: Lipase: 36 U/L (ref 11–51)

## 2018-12-28 LAB — POCT PREGNANCY, URINE: Preg Test, Ur: NEGATIVE

## 2018-12-28 MED ORDER — IOHEXOL 350 MG/ML SOLN
100.0000 mL | Freq: Once | INTRAVENOUS | Status: AC | PRN
  Administered 2018-12-28: 100 mL via INTRAVENOUS

## 2018-12-28 MED ORDER — OXYCODONE HCL 5 MG PO TABS: 5.0000 mg | ORAL_TABLET | Freq: Three times a day (TID) | ORAL | 0 refills | Status: AC | PRN

## 2018-12-28 MED ORDER — ONDANSETRON 4 MG PO TBDP
4.0000 mg | ORAL_TABLET | Freq: Once | ORAL | Status: AC | PRN
  Administered 2018-12-28: 4 mg via ORAL
  Filled 2018-12-28: qty 1

## 2018-12-28 MED ORDER — IOHEXOL 240 MG/ML SOLN
50.0000 mL | Freq: Once | INTRAMUSCULAR | Status: AC | PRN
  Administered 2018-12-28: 50 mL via ORAL

## 2018-12-28 MED ORDER — ACETAMINOPHEN 325 MG PO TABS
650.0000 mg | ORAL_TABLET | Freq: Once | ORAL | Status: AC
  Administered 2018-12-28: 650 mg via ORAL
  Filled 2018-12-28: qty 2

## 2018-12-28 MED ORDER — KETOROLAC TROMETHAMINE 30 MG/ML IJ SOLN
30.0000 mg | Freq: Once | INTRAMUSCULAR | Status: AC
  Administered 2018-12-28: 30 mg via INTRAVENOUS
  Filled 2018-12-28: qty 1

## 2018-12-28 MED ORDER — FENTANYL CITRATE (PF) 100 MCG/2ML IJ SOLN
50.0000 ug | Freq: Once | INTRAMUSCULAR | Status: AC
  Administered 2018-12-28: 50 ug via INTRAVENOUS
  Filled 2018-12-28: qty 2

## 2018-12-28 MED ORDER — SODIUM CHLORIDE 0.9 % IV BOLUS
1000.0000 mL | Freq: Once | INTRAVENOUS | Status: AC
  Administered 2018-12-28: 1000 mL via INTRAVENOUS

## 2018-12-28 NOTE — ED Provider Notes (Signed)
[facility] Emergency Department Provider Note  ____________________________________________   First MD Initiated Contact with Patient 12/28/18 1508     (approximate)  I have reviewed the triage vital signs and the nursing notes.   HISTORY  Chief Complaint Abdominal Pain    HPI Kristie Hendricks is a 33 y.o. female who presents with 2 weeks of left lower quadrant abdominal pain.  Patient says the pain is been intermittent in nature.  Patient seen by OB/GYN for the same had ultrasound done recently.   Patient says she is had left lower quadrant pain this been going on for the past 1 week.  It is been intermittent in nature however is gotten worse lately.  The pain is now constant.  She had a bowel movement today that was hard in nature and her pain is now constant and worse since then.  She is been feeling nauseous without vomiting.  She denies any fevers.  She is been tolerating p.o.  Patient had a ultrasound on 7/30.  Plan to have a transvaginal ultrasound tomorrow.  Denies any new sexual contacts or concerns for STDs.    Ultrasound on 7/30 showed left inguinal lymph node that looked benign nature    Past Medical History:  Diagnosis Date  . Anxiety   . Asthma   . Bipolar disorder (HCC)   . Chickenpox   . Depression   . Frequent headaches   . Hypertension   . Migraines   . Pancreatitis     Patient Active Problem List   Diagnosis Date Noted  . Pelvic pain 12/27/2018  . Epigastric pain 12/17/2018  . Left groin pain 12/17/2018  . Numbness and tingling 09/13/2018  . Chronic neck pain 09/13/2018  . Epiblepharon 08/11/2018  . Entropion of right lower eyelid 08/11/2018  . Blepharitis 08/11/2018  . Allergic conjunctivitis 08/11/2018  . Punctate keratitis 08/11/2018  . Edema 07/20/2018  . Cellulitis and abscess of left lower extremity 06/07/2018  . Lower abdominal pain 06/01/2018  . Chronic back pain 03/09/2018  . Preventative health care 03/09/2018   . Abnormal Pap smear of cervix 11/06/2017  . Morbid obesity (HCC) 10/28/2016  . Status post gastric bypass for obesity 10/28/2016  . Lymphedema of lower extremity 10/28/2016  . 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  . Bipolar disorder (HCC) 10/28/2016  . Hypertension 10/28/2016  . Polycystic ovary syndrome 10/28/2016  . Weight gain 09/03/2016  . Sciatica of right side 02/07/2016  . Trochanteric bursitis of both hips 01/15/2016  . Lymphedema 04/01/2014  . Sacroiliac dysfunction 03/31/2014  . Hyperlipidemia 03/30/2014  . Entropion 01/11/2014  . GERD without esophagitis 12/10/2013  . Paresthesias/numbness 10/20/2013  . Allergic rhinitis 10/16/2013  . Shortness of breath 10/16/2013  . Bipolar I disorder, most recent episode (or current) depressed, severe, specified as with psychotic behavior (HCC) 10/13/2013  . 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 Carpal tunnel syndrome, left 12/16/2012  . Vaginal bleeding 09/14/2012  . Hand pain 08/18/2012  . Leukocytosis 07/13/2012  . Hypokalemia 07/13/2012  . Elevated lipase 05/26/2012  . Fatigue 07/15/2011    Past Surgical History:  Procedure Laterality Date  . DG GALL BLADDER  2003  . GASTRIC BYPASS  2016  . KNEE ARTHROSCOPY Left 08/26/2017    Prior to Admission medications   Medication Sig Start Date End Date Taking? Authorizing Provider  clonazePAM (KLONOPIN) 0.5 MG tablet Take 0.5-1 tablets (0.25-0.5 mg total) by mouth as directed. Only for severe anxiety attacks up to 2-3 times a week.  Patient not taking: Reported on 12/17/2018 08/11/18   Jomarie LongsEappen, Saramma, MD  doxycycline (VIBRAMYCIN) 100 MG capsule Take 1 capsule (100 mg total) by mouth 2 (two) times daily. 12/27/18   Reva BoresPratt, Tanya S, MD  famotidine (PEPCID) 40 MG tablet Take by mouth.    [provider]  fexofenadine (ALLEGRA) 180 MG tablet Take 180 mg by mouth daily as needed.     [provider]  gabapentin (NEURONTIN) 100 MG capsule Take 1 capsule (100  mg total) by mouth 3 (three) times daily. Titrate up as directed 09/29/18   Copland, Karleen HampshireSpencer, MD  penicillin v potassium (VEETID) 500 MG tablet Take 1 tablet (500 mg total) by mouth 4 (four) times daily. 11/15/18   Cliffton Astersampbell, John, MD  traZODone (DESYREL) 50 MG tablet Take 0.5-1 tablets (25-50 mg total) by mouth at bedtime as needed for sleep. 07/20/18   Jomarie LongsEappen, Saramma, MD    Allergies Clindamycin, Prednisone, Dexamethasone, Metformin and related, Other, Morphine, and Povidone iodine  Family History  Problem Relation Age of Onset  . Asthma Mother   . COPD Mother   . Hyperlipidemia Mother   . Hypertension Mother   . Stroke Mother   . Arthritis Father   . Diabetes Father   . Heart disease Father   . Hyperlipidemia Father   . Hypertension Father   . Kidney disease Father   . Anxiety disorder Father   . Asthma Sister   . Depression Sister   . OCD Sister   . Asthma Brother   . Depression Brother   . Uterine cancer Maternal Grandmother [id number]    Social History Social History   Tobacco Use  . Smoking status: Never Smoker  . Smokeless tobacco: Never Used  Substance Use Topics  . Alcohol use: Yes    Alcohol/week: 1.0 standard drinks    Types: 1 Glasses of wine per week    Comment: socially   . Drug use: No      Review of Systems Constitutional: No fever/chills Eyes: No visual changes. ENT: No sore throat. Cardiovascular: Denies chest pain. Respiratory: Denies shortness of breath. Gastrointestinal: + abd pain. + nausea, no vomiting.  No diarrhea.  No constipation. Genitourinary: Negative for dysuria. Musculoskeletal: Negative for back pain. Skin: Negative for rash. Neurological: Negative for headaches, focal weakness or numbness. All other ROS negative ____________________________________________   PHYSICAL EXAM:  VITAL SIGNS: ED Triage Vitals  Enc Vitals Group     BP 12/28/18 1429 (!) 151/92     Pulse Rate 12/28/18 1429 94     Resp 12/28/18 1429 20     Temp  12/28/18 1429 98.2 F (36.8 C)     Temp Source 12/28/18 1429 Oral     SpO2 12/28/18 1429 97 %     Weight 12/28/18 1428 266 lb (120.7 kg)     Height 12/28/18 1428 5\' 7"  (1.702 m)     Head Circumference --      Peak Flow --      Pain Score 12/28/18 1427 8     Pain Loc --      Pain Edu? --      Excl. in GC? --     Constitutional: Alert and oriented. Well appearing and in no acute distress. Eyes: Conjunctivae are normal. EOMI. Head: Atraumatic. Nose: No congestion/rhinnorhea. Mouth/Throat: Mucous membranes are moist.   Neck: No stridor. Trachea Midline. FROM Cardiovascular: Normal rate, regular rhythm. Grossly normal heart sounds.  Good peripheral circulation. Respiratory: Normal respiratory effort.  No retractions.  Lungs CTAB. Gastrointestinal: Soft but LLQ pain without rebound or guarding. No distention. No abdominal bruits.  Musculoskeletal: No lower extremity tenderness nor edema.  No joint effusions. Neurologic:  Normal speech and language. No gross focal neurologic deficits are appreciated.  Skin:  Skin is warm, dry and intact. No rash noted. Psychiatric: Mood and affect are normal. Speech and behavior are normal. GU: Deferred   ____________________________________________   LABS (all labs ordered are listed, but only abnormal results are displayed)  Labs Reviewed  URINALYSIS, COMPLETE (UACMP) WITH MICROSCOPIC - Abnormal; Notable for the following components:      Result Value   Color, Urine STRAW (*)    APPearance CLEAR (*)    Hgb urine dipstick MODERATE (*)    All other components within normal limits  LIPASE, BLOOD  COMPREHENSIVE METABOLIC PANEL  CBC  POC URINE PREG, ED  POCT PREGNANCY, URINE   ____________________________________________   Official radiology report(s): Ct Abdomen Pelvis W Contrast  Result Date: 12/28/2018 CLINICAL DATA:  Left lower quadrant pain EXAM: CT ABDOMEN AND PELVIS WITH CONTRAST TECHNIQUE: Multidetector CT imaging of the abdomen and  pelvis was performed using the standard protocol following bolus administration of intravenous contrast. CONTRAST:  167mL OMNIPAQUE IOHEXOL 350 MG/ML SOLN COMPARISON:  06/01/2018 FINDINGS: Lower chest: No acute abnormality. Hepatobiliary: No focal liver abnormality is seen. Status post cholecystectomy. No biliary dilatation. Pancreas: Unremarkable. No pancreatic ductal dilatation or surrounding inflammatory changes. Spleen: Enlarged spleen. Previously noted hypodense lesion in the posterior spleen is less apparent with vague hypodensity in the region. Adrenals/Urinary Tract: Adrenal glands are normal. Kidneys show no hydronephrosis. The bladder is slightly thick walled. Stomach/Bowel: Status post gastric bypass without evidence for an obstruction. Appendix appears normal. No evidence of bowel wall thickening, distention, or inflammatory changes. Vascular/Lymphatic: No significant vascular findings are present. No enlarged abdominal or pelvic lymph nodes. Reproductive: No adnexal mass. Malpositioned IUD which is visible in the lower uterine segment and cervix. IUD is rotated with the T prongs oriented anterior to posterior and possible myometrial penetration of the posterior prong. Other: Negative for free air or free fluid Musculoskeletal: Degenerative changes at L5-S1. IMPRESSION: 1. Slightly thick-walled appearance of urinary bladder, possible cystitis. 2. Malpositioned IUD as described above which has migrated into the lower uterine segment and cervix 3. Slightly enlarged spleen. Hypodense lesion in the posterior spleen is less apparent today. Electronically Signed   By: Donavan Foil M.D.   On: 12/28/2018 17:39    ____________________________________________   PROCEDURES  Procedure(s) performed (including Critical Care):  Procedures   ____________________________________________   INITIAL IMPRESSION / ASSESSMENT AND PLAN / ED COURSE  Kristie Hendricks was evaluated in Emergency Department on 12/28/2018  for the symptoms described in the history of present illness. She was evaluated in the context of the global COVID-19 pandemic, which necessitated consideration that the patient might be at risk for infection with the SARS-CoV-2 virus that causes COVID-19. Institutional protocols and algorithms that pertain to the evaluation of patients at risk for COVID-19 are in a state of rapid change based on information released by regulatory bodies including the CDC and federal and state organizations. These policies and algorithms were followed during the patient's care in the ED.     Patient presents with left lower quadrant pain.  Will get CT scan to evaluate for obstruction, diverticulitis given patient's prior surgical history.  This does not sound secondary to ovarian torsion and patient already has an outpatient transvaginal ultrasound scheduled for tomorrow.  Will get  pregnancy test to evaluate for ectopic pregnancy.  Urine to evaluate for UTI.  Patient declined pelvic exam to screen for STDs or PID given recently screened and no new sexual partners.  Labs are reassuring with normal white count, normal LFTs, normal lipase.  No evidence of UTI   UA without evidence of UTI although CT is concerning for possible cystitis.  Patient has no symptoms suggest UTI therefore will hold off on antibiotics.  CT scan concerning for malpositioned IUD that is migrated into the lower uterine segment and cervix.  6:18 PM I discussed with Dr. Shawnie PonsPratt patient's OB/GYN.  Patient has a follow-up appointment in 2 days.  She recommends the patient follows up and will have her IUD removed at that time.  I discussed return precautions to suggest uterine perforation prior to then with patient.  Patient feels comfortable with discharge home.  Will give short course of oxycodone.  Instructed not to drive while on it.   ____________________________________________   FINAL CLINICAL IMPRESSION(S) / ED DIAGNOSES   Final diagnoses:   Malpositioned intrauterine device (IUD), initial encounter      MEDICATIONS GIVEN DURING THIS VISIT:  Medications  ketorolac (TORADOL) 30 MG/ML injection 30 mg (has no administration in time range)  acetaminophen (TYLENOL) tablet 650 mg (has no administration in time range)  ondansetron (ZOFRAN-ODT) disintegrating tablet 4 mg (4 mg Oral Given 12/28/18 1431)  fentaNYL (SUBLIMAZE) injection 50 mcg (50 mcg Intravenous Given 12/28/18 1538)  sodium chloride 0.9 % bolus 1,000 mL (1,000 mLs Intravenous New Bag/Given 12/28/18 1541)  iohexol (OMNIPAQUE) 240 MG/ML injection 50 mL (50 mLs Oral Contrast Given 12/28/18 1539)  iohexol (OMNIPAQUE) 350 MG/ML injection 100 mL (100 mLs Intravenous Contrast Given 12/28/18 1723)     ED Discharge Orders         Ordered    oxyCODONE (ROXICODONE) 5 MG immediate release tablet  Every 8 hours PRN     12/28/18 1822           Note:  This document was prepared using Dragon voice recognition software and may include unintentional dictation errors.   Concha SeFunke, Jameica Couts E, MD 12/28/18 [phone]

## 2018-12-28 NOTE — ED Notes (Signed)
Unhooked so can use bathroom then will be transported to CT with tech.  Will obtain VS when returns.

## 2018-12-28 NOTE — Discharge Instructions (Addendum)
I do not think you have a UTI given your urine was negative.  You should follow-up with Dr. Kennon Rounds in 2 days to have your IUD removed.  Return to the ER for worsening abdominal pain, vomiting or any other concerns.  Take tylenol for pain. Add oxycodone for break through pain. Do no drive while on this.    1. Slightly thick-walled appearance of urinary bladder, possible  cystitis.  2. Malpositioned IUD as described above which has migrated into the  lower uterine segment and cervix  3. Slightly enlarged spleen. Hypodense lesion in the posterior  spleen is less apparent today.

## 2018-12-28 NOTE — ED Triage Notes (Signed)
C/O LLQ abdominal pain x 2 weeks.  States pain has been intermittent.  Has been seen by OBGYN for same and had a ultrasound done recently.  States pain worsened today about 90 minutes ago after moving bowels.

## 2018-12-28 NOTE — ED Notes (Signed)
Pt c/o LLQ stabbing, aching abd pain since last Thur. 8/10. C/o nauseous and 3 episodes of vomiting since the pain started. Pt has been c/o diarrhea but said she was constipated this am. During this bowel movement this am, pain increased 10/10 and was sharp. Denies fever. On assessment, abd was soft and tender in LLQ. Denies any changes in urine.

## 2018-12-29 ENCOUNTER — Telehealth: Payer: Self-pay | Admitting: *Deleted

## 2018-12-29 ENCOUNTER — Ambulatory Visit: Payer: BLUE CROSS/BLUE SHIELD | Admitting: Clinical

## 2018-12-29 ENCOUNTER — Other Ambulatory Visit: Payer: Self-pay

## 2018-12-29 ENCOUNTER — Ambulatory Visit (HOSPITAL_COMMUNITY)
Admission: RE | Admit: 2018-12-29 | Discharge: 2018-12-29 | Disposition: A | Discharge status: Home / Self Care | Payer: BC Managed Care – PPO | Source: Ambulatory Visit | Attending: Family Medicine | Admitting: Family Medicine

## 2018-12-29 DIAGNOSIS — R102 Pelvic and perineal pain: Secondary | ICD-10-CM | POA: Diagnosis not present

## 2018-12-29 LAB — CERVICOVAGINAL ANCILLARY ONLY
Chlamydia: NEGATIVE
Neisseria Gonorrhea: NEGATIVE

## 2018-12-29 NOTE — Telephone Encounter (Signed)
Patient in office for an outpatient ultrasound.  Sonographer came to the office to question whether scan is needed because patient was seen in emergency department yesterday.  Had a CT.  CT showed malpositioned IUD.  I spoke with patient to explain today's ultrasound not needed.  Patient states she called the [facility] office this morning and was told to keep this appointment.  States they told her Dr. Kennon Rounds also wanted to see her ovaries and that there was mention in the CT of perforation with IUD malposition.  Explained to patient Dr. 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  Patient stated understanding.  I spoke with the sonographer and updated her on conversation with the patient.  Agrees to complete the scan.  I then went back to the patient.  Patient asks if the she will be shown the pictures in ultrasound.  States she is concerned about the placement of the IUD and wants to see the picture to understand what she may be facing when Dr. Kennon Rounds removes the IUD tomorrow.  I explained to patient the sonographer may not show her the pictures.  I further explained Dr. Kennon Rounds would discuss everything with her tomorrow and would probably draw her pictures so she had a complete understanding.  I explained she and Dr. Kennon Rounds would discuss all options at her visit tomorrow.  Patient states she is in pain and is worried about having the IUD removed and with the question of perforation having additional issues.  States she is currently taking tylenol and advil for her pain.  I explained if Dr. Kennon Rounds is concerned about other issues then she probably won't remove the IUD in the office.  Patient states she was given additional pain med at the emergency room yesterday but is returning to work today and can't take that while working.  Patient guarding lower left side of abdomin several times during our conversation.  Encouraged patient to share all her  concerns with Dr. Kennon Rounds at her visit tomorrow.  Patient agrees.  Walked patient to sonographer to get her scan.

## 2018-12-30 ENCOUNTER — Encounter: Payer: Self-pay | Admitting: *Deleted

## 2018-12-30 ENCOUNTER — Ambulatory Visit (INDEPENDENT_AMBULATORY_CARE_PROVIDER_SITE_OTHER): Payer: BC Managed Care – PPO | Admitting: Family Medicine

## 2018-12-30 ENCOUNTER — Encounter: Payer: Self-pay | Admitting: Family Medicine

## 2018-12-30 VITALS — BP 139/84 | HR 70

## 2018-12-30 DIAGNOSIS — Z30432 Encounter for removal of intrauterine contraceptive device: Secondary | ICD-10-CM

## 2018-12-30 DIAGNOSIS — R102 Pelvic and perineal pain: Secondary | ICD-10-CM

## 2018-12-30 NOTE — Progress Notes (Signed)
   Subjective:    Patient ID: Kristie Hendricks is a 33 y.o. female presenting with Follow-up  on 12/30/2018  HPI: Returns with left sided pelvic pain. Seen in ED due to severe pain related to BM. CT showed  IUD noted to be in AP orientation, thickened bladder w/ possible UTI, no bowel involvement. Pelvic sono o/w nml. Cultures negative.   Review of Systems  Constitutional: Negative for chills and fever.  Respiratory: Negative for shortness of breath.   Cardiovascular: Negative for chest pain.  Gastrointestinal: Negative for abdominal pain, nausea and vomiting.  Genitourinary: Negative for dysuria.  Skin: Negative for rash.      Objective:    BP 139/84   Pulse 70   LMP 12/28/2018 Comment: neg preg test 12/28/18 Physical Exam Exam conducted with a chaperone present.  Constitutional:      General: She is not in acute distress.    Appearance: She is well-developed.  HENT:     Head: Normocephalic and atraumatic.  Eyes:     General: No scleral icterus. Neck:     Musculoskeletal: Neck supple.  Cardiovascular:     Rate and Rhythm: Normal rate.  Pulmonary:     Effort: Pulmonary effort is normal.  Abdominal:     Palpations: Abdomen is soft.  Genitourinary:    General: Normal vulva.     Vagina: No vaginal discharge.     Cervix: Normal.  Skin:    General: Skin is warm and dry.  Neurological:     Mental Status: She is alert and oriented to person, place, and time.    Procedure: Speculum placed inside vagina.  Cervix visualized.  Strings grasped with ring forceps.  IUD removed intact, without difficulty      Assessment & Plan:   Problem List Items Addressed This Visit    None    Visit Diagnoses    Encounter for IUD removal    -  Primary   Hopefully this alleviates her pain. Let me know next week if this helped. Ibuprofen today if needed.      Total face-to-face time with patient: 10 minutes. Over 50% of encounter was spent on counseling and coordination of care. Return in  about 4 weeks (around 01/27/2019), or if symptoms worsen or fail to improve, for a CPE.  Donnamae Jude 12/30/2018 3:47 PM

## 2018-12-31 ENCOUNTER — Ambulatory Visit (INDEPENDENT_AMBULATORY_CARE_PROVIDER_SITE_OTHER): Payer: BC Managed Care – PPO | Admitting: Psychiatry

## 2018-12-31 ENCOUNTER — Encounter: Payer: Self-pay | Admitting: Psychiatry

## 2018-12-31 ENCOUNTER — Other Ambulatory Visit: Payer: Self-pay

## 2018-12-31 DIAGNOSIS — F410 Panic disorder [episodic paroxysmal anxiety] without agoraphobia: Secondary | ICD-10-CM | POA: Diagnosis not present

## 2018-12-31 DIAGNOSIS — F3160 Bipolar disorder, current episode mixed, unspecified: Secondary | ICD-10-CM

## 2018-12-31 DIAGNOSIS — F5105 Insomnia due to other mental disorder: Secondary | ICD-10-CM

## 2018-12-31 NOTE — Progress Notes (Signed)
Virtual Visit via Video Note  I connected with Kristie ReinLisa Misch on 12/31/18 at 10:00 AM EDT by a video enabled telemedicine application and verified that I am speaking with the correct person using two identifiers.   I discussed the limitations of evaluation and management by telemedicine and the availability of in person appointments. The patient expressed understanding and agreed to proceed.   I discussed the assessment and treatment plan with the patient. The patient was provided an opportunity to ask questions and all were answered. The patient agreed with the plan and demonstrated an understanding of the instructions.   The patient was advised to call back or seek an in-person evaluation if the symptoms worsen or if the condition fails to improve as anticipated.   BH MD OP Progress Note  12/31/2018 12:26 PM Kristie Hendricks  MRN:  [id number]  Chief Complaint:  Chief Complaint    Follow-up     HPI: Kristie Hendricks is a 33 year old Caucasian female, employed, lives in [place], has a history of bipolar disorder, panic attacks, insomnia, history of gastric bypass, cellulitis, lymphedema, hypertension was evaluated by telemedicine today.  Patient today reports she has been struggling with some depressive symptoms recently.  She however reports she is able to cope with her symptoms and does not think she needs any medication readjustment today.  She reports she feels depressed more so because she is lonely.  She reports her boyfriend works all the time.  She feels isolated due to the pandemic.  She is an extrovert and it is very hard for her to stay home all day without any social interaction.  She reports she was exercising and had started following a diet however recently she had some complications from her IUD and had to stop exercising for a few weeks.  She however reports the pain is getting better and she is going to start exercising again and that may help her.  Patient reports sleep is good.  She  continues to have trazodone which she takes as needed.  Patient continues to work with her therapist and reports therapy sessions is going well.  Visit Diagnosis:    ICD-10-CM   1. Mixed bipolar I disorder (HCC)  F31.60   2. Panic attack  F41.0   3. Insomnia due to mental condition  F51.05     Past Psychiatric History: I have reviewed past psychiatric history from my progress note on 07/20/2018.  Past trials of medications like risperidone, lithium, Seroquel-made her face going numb, Celexa, Lamictal, Abilify.  Past Medical History:  Past Medical History:  Diagnosis Date  . Anxiety   . Asthma   . Bipolar disorder (HCC)   . Chickenpox   . Depression   . Frequent headaches   . Hypertension   . Migraines   . Pancreatitis     Past Surgical History:  Procedure Laterality Date  . DG GALL BLADDER  2003  . GASTRIC BYPASS  2016  . KNEE ARTHROSCOPY Left 08/26/2017    Family Psychiatric History: I have reviewed family psychiatric history from my progress note on 07/20/2018.  Family History:  Family History  Problem Relation Age of Onset  . Asthma Mother   . COPD Mother   . Hyperlipidemia Mother   . Hypertension Mother   . Stroke Mother   . Arthritis Father   . Diabetes Father   . Heart disease Father   . Hyperlipidemia Father   . Hypertension Father   . Kidney disease Father   . Anxiety disorder  Father   . Asthma Sister   . Depression Sister   . OCD Sister   . Asthma Brother   . Depression Brother   . Uterine cancer Maternal Grandmother 79    Social History: I have reviewed social history from my progress note on 07/20/2018. Social History   Socioeconomic History  . Marital status: Significant Other    Spouse name: Not on file  . Number of children: 0  . Years of education: Not on file  . Highest education level: Associate degree: occupational, Hotel manager, or vocational program  Occupational History  . Not on file  Social Needs  . Financial resource strain: Not  hard at all  . Food insecurity    Worry: Never true    Inability: Never true  . Transportation needs    Medical: No    Non-medical: No  Tobacco Use  . Smoking status: Never Smoker  . Smokeless tobacco: Never Used  Substance and Sexual Activity  . Alcohol use: Yes    Alcohol/week: 1.0 standard drinks    Types: 1 Glasses of wine per week    Comment: socially   . Drug use: No  . Sexual activity: Yes    Partners: Male    Birth control/protection: I.U.D., Condom  Lifestyle  . Physical activity    Days per week: 0 days    Minutes per session: 0 min  . Stress: Very much  Relationships  . Social Herbalist on phone: Not on file    Gets together: Not on file    Attends religious service: Never    Active member of club or organization: No    Attends meetings of clubs or organizations: Never    Relationship status: Never married  Other Topics Concern  . Not on file  Social History Narrative  . Not on file    Allergies:  Allergies  Allergen Reactions  . Clindamycin Anaphylaxis  . Prednisone Other (See Comments) and Nausea And Vomiting    States any steroids pancreatitis pancreatitis  . Dexamethasone Nausea And Vomiting  . Metformin And Related Other (See Comments)    Severe diarrhea  . Other Nausea And Vomiting    Steroid that starts with a C  - causes Pancreatis   . Morphine Anxiety  . Povidone Iodine Hives and Rash    Metabolic Disorder Labs: Lab Results  Component Value Date   HGBA1C 4.9 10/07/2017   No results found for: PROLACTIN Lab Results  Component Value Date   CHOL 137 12/21/2017   TRIG 113.0 12/21/2017   HDL 46.90 12/21/2017   CHOLHDL 3 12/21/2017   VLDL 22.6 12/21/2017   LDLCALC 67 12/21/2017   Lab Results  Component Value Date   TSH 1.310 10/07/2017    Therapeutic Level Labs: No results found for: LITHIUM No results found for: VALPROATE No components found for:  CBMZ  Current Medications: Current Outpatient Medications   Medication Sig Dispense Refill  . clonazePAM (KLONOPIN) 0.5 MG tablet Take 0.5-1 tablets (0.25-0.5 mg total) by mouth as directed. Only for severe anxiety attacks up to 2-3 times a week. (Patient not taking: Reported on 12/17/2018) 12 tablet 0  . doxycycline (VIBRAMYCIN) 100 MG capsule Take 1 capsule (100 mg total) by mouth 2 (two) times daily. 20 capsule 0  . famotidine (PEPCID) 40 MG tablet Take by mouth.    . fexofenadine (ALLEGRA) 180 MG tablet Take 180 mg by mouth daily as needed.     . gabapentin (NEURONTIN)  100 MG capsule Take 1 capsule (100 mg total) by mouth 3 (three) times daily. Titrate up as directed 90 capsule 3  . oxyCODONE (ROXICODONE) 5 MG immediate release tablet Take 1 tablet (5 mg total) by mouth every 8 (eight) hours as needed for up to 5 days. 15 tablet 0  . penicillin v potassium (VEETID) 500 MG tablet Take 1 tablet (500 mg total) by mouth 4 (four) times daily. 20 tablet 5  . traZODone (DESYREL) 50 MG tablet Take 0.5-1 tablets (25-50 mg total) by mouth at bedtime as needed for sleep. 30 tablet 1   No current facility-administered medications for this visit.      Musculoskeletal: Strength & Muscle Tone: UTA Gait & Station: normal Patient leans: N/A  Psychiatric Specialty Exam: Review of Systems  Psychiatric/Behavioral: Positive for depression.  All other systems reviewed and are negative.   Last menstrual period 12/28/2018.There is no height or weight on file to calculate BMI.  General Appearance: Casual  Eye Contact:  Good  Speech:  Clear and Coherent  Volume:  Normal  Mood:  Depressed situational  Affect:  Congruent  Thought Process:  Goal Directed and Descriptions of Associations: Intact  Orientation:  Full (Time, Place, and Person)  Thought Content: Logical   Suicidal Thoughts:  No  Homicidal Thoughts:  No  Memory:  Immediate;   Fair Recent;   Fair Remote;   Fair  Judgement:  Fair  Insight:  Fair  Psychomotor Activity:  Normal  Concentration:   Concentration: Fair and Attention Span: Fair  Recall:  FiservFair  Fund of Knowledge: Fair  Language: Fair  Akathisia:  No  Handed:  Right  AIMS (if indicated): denies tremors, rigidity  Assets:  Communication Skills Desire for Improvement Social Support  ADL's:  Intact  Cognition: WNL  Sleep:  Fair   Screenings: PHQ2-9     [facility]  PHQ-2 Total Score  0  2  1  2   0  PHQ-9 Total Score  -  9  -  6  -       Assessment and Plan: Kristie Hendricks is a 33 year old Caucasian female, employed, lives in [place], has a history of bipolar disorder, insomnia, history of gastric bypass, cellulitis, lymphedema, hypertension was evaluated by telemedicine today.  Patient is biologically predisposed given her family history as well as history of trauma.  Patient also has psychosocial stressors of financial problems, work-related problems as well as health issues and social isolation due to pandemic.  Patient however currently reports mood symptoms as manageable and she continues to be in psychotherapy sessions.  Plan as noted below.  Plan For bipolar disorder type I mixed- stable Continue CBT  For panic attacks-stable Continue CBT  For insomnia-stable Trazodone 25 to 50 mg p.o. nightly as needed  Follow-up in clinic as needed.  Patient currently giving herself a drug holiday and will call back as needed if she feels she needs medication management.  I have spent atleast 15 minutes non  face to face with patient today. More than 50 % of the time was spent for psychoeducation and supportive psychotherapy and care  coordination.   This note was generated  in part or whole with voice recognition software. Voice recognition is usually quite accurate but there are transcription errors that can and very often do occur. I apologize for any typographical errors that were not detected and corrected.        Jomarie LongsSaramma Joshuajames Moehring, MD 12/31/2018, 12:26 PM

## 2019-01-01 ENCOUNTER — Ambulatory Visit (INDEPENDENT_AMBULATORY_CARE_PROVIDER_SITE_OTHER): Payer: BC Managed Care – PPO | Admitting: Clinical

## 2019-01-01 DIAGNOSIS — F332 Major depressive disorder, recurrent severe without psychotic features: Secondary | ICD-10-CM

## 2019-01-01 DIAGNOSIS — F3171 Bipolar disorder, in partial remission, most recent episode hypomanic: Secondary | ICD-10-CM

## 2019-01-05 ENCOUNTER — Ambulatory Visit: Payer: BLUE CROSS/BLUE SHIELD | Admitting: Clinical

## 2019-01-08 ENCOUNTER — Ambulatory Visit (INDEPENDENT_AMBULATORY_CARE_PROVIDER_SITE_OTHER): Payer: BC Managed Care – PPO | Admitting: Clinical

## 2019-01-08 DIAGNOSIS — F332 Major depressive disorder, recurrent severe without psychotic features: Secondary | ICD-10-CM

## 2019-01-12 ENCOUNTER — Ambulatory Visit: Payer: BLUE CROSS/BLUE SHIELD | Admitting: Clinical

## 2019-01-12 ENCOUNTER — Ambulatory Visit: Payer: BC Managed Care – PPO | Admitting: Family Medicine

## 2019-01-19 ENCOUNTER — Ambulatory Visit: Payer: BLUE CROSS/BLUE SHIELD | Admitting: Clinical

## 2019-01-20 ENCOUNTER — Ambulatory Visit: Payer: BC Managed Care – PPO | Admitting: Family Medicine

## 2019-01-22 ENCOUNTER — Ambulatory Visit (INDEPENDENT_AMBULATORY_CARE_PROVIDER_SITE_OTHER): Payer: BC Managed Care – PPO | Admitting: Clinical

## 2019-01-22 DIAGNOSIS — F332 Major depressive disorder, recurrent severe without psychotic features: Secondary | ICD-10-CM

## 2019-01-24 ENCOUNTER — Ambulatory Visit: Payer: BC Managed Care – PPO | Admitting: Clinical

## 2019-01-26 ENCOUNTER — Other Ambulatory Visit: Payer: Self-pay

## 2019-01-26 ENCOUNTER — Encounter: Payer: Self-pay | Admitting: Family Medicine

## 2019-01-26 ENCOUNTER — Ambulatory Visit (INDEPENDENT_AMBULATORY_CARE_PROVIDER_SITE_OTHER): Payer: BC Managed Care – PPO | Admitting: Family Medicine

## 2019-01-26 ENCOUNTER — Ambulatory Visit: Payer: BLUE CROSS/BLUE SHIELD | Admitting: Clinical

## 2019-01-26 VITALS — BP 104/82 | HR 71 | Temp 98.0°F | Ht 67.0 in | Wt 273.2 lb

## 2019-01-26 DIAGNOSIS — M7501 Adhesive capsulitis of right shoulder: Secondary | ICD-10-CM

## 2019-01-26 DIAGNOSIS — M7581 Other shoulder lesions, right shoulder: Secondary | ICD-10-CM

## 2019-01-26 NOTE — Progress Notes (Signed)
Kristie Hendricks T. Kristie Schubring, MD Primary Care and Sports Medicine [facility] [place], [address] Phone: [phone]  FAX: [place] - 33 y.o. female  MRN [id number]  Date of Birth: Dec 05, 1985  Visit Date: 01/26/2019  PCP: Kristie Koch, NP  Referred by: Kristie Koch, NP  Chief Complaint  Patient presents with  . Follow-up    Right Shoulder   Subjective:   Kristie Hendricks is a 33 y.o. very pleasant female patient with Body mass index is 42.8 kg/m. who presents with the following:  F/u R shoulder, loss of motion, clinically frozen shoulder.  4 months.  She has been making some progress, and I did an intra-articular injection on her last office visit and this seemed to provide some pain and increased her range of motion.  She is still struggling with some loss of motion, but is improved.  She has had some breakthrough with pain in her arc of motion.  She still having a tissue distribution of pain.  chiropractor  Past Medical History, Surgical History, Social History, Family History, Problem List, Medications, and Allergies have been reviewed and updated if relevant.  Patient Active Problem List   Diagnosis Date Noted  . Mixed bipolar I disorder ([place]) 12/31/2018  . Panic attack 12/31/2018  . Insomnia due to mental condition 12/31/2018  . Pelvic pain 12/27/2018  . Epigastric pain 12/17/2018  . Left groin pain 12/17/2018  . Numbness and tingling 09/13/2018  . Chronic neck pain 09/13/2018  . Epiblepharon 08/11/2018  . Entropion of right lower eyelid 08/11/2018  . Blepharitis 08/11/2018  . Allergic conjunctivitis 08/11/2018  . Punctate keratitis 08/11/2018  . Edema 07/20/2018  . Cellulitis and abscess of left lower extremity 06/07/2018  . Lower abdominal pain 06/01/2018  . Chronic back pain 03/09/2018  . Preventative health care 03/09/2018  . Abnormal Pap smear of cervix 11/06/2017  . Morbid obesity ([place])  10/28/2016  . Status post gastric bypass for obesity 10/28/2016  . Lymphedema of lower extremity 10/28/2016  . 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  . Bipolar disorder ([place]) 10/28/2016  . Hypertension 10/28/2016  . Polycystic ovary syndrome 10/28/2016  . Weight gain 09/03/2016  . Sciatica of right side 02/07/2016  . Trochanteric bursitis of both hips 01/15/2016  . Lymphedema 04/01/2014  . Sacroiliac dysfunction 03/31/2014  . Hyperlipidemia 03/30/2014  . Entropion 01/11/2014  . GERD without esophagitis 12/10/2013  . Paresthesias/numbness 10/20/2013  . Allergic rhinitis 10/16/2013  . Shortness of breath 10/16/2013  . Bipolar I disorder, most recent episode (or current) depressed, severe, specified as with psychotic behavior ([place]) 10/13/2013  . 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 Carpal tunnel syndrome, left 12/16/2012  . Vaginal bleeding 09/14/2012  . Hand pain 08/18/2012  . Leukocytosis 07/13/2012  . Hypokalemia 07/13/2012  . Elevated lipase 05/26/2012  . Fatigue 07/15/2011    Past Medical History:  Diagnosis Date  . Anxiety   . Asthma   . Bipolar disorder ([place])   . Chickenpox   . Depression   . Frequent headaches   . Hypertension   . Migraines   . Pancreatitis     Past Surgical History:  Procedure Laterality Date  . DG GALL BLADDER  2003  . GASTRIC BYPASS  2016  . KNEE ARTHROSCOPY Left 08/26/2017    Social History   Socioeconomic History  . Marital status: Significant Other    Spouse name: Not on file  . Number of children:  0  . Years of education: Not on file  . Highest education level: Associate degree: occupational, Scientist, product/process development, or vocational program  Occupational History  . Not on file  Social Needs  . Financial resource strain: Not hard at all  . Food insecurity    Worry: Never true    Inability: Never true  . Transportation needs    Medical: No    Non-medical: No  Tobacco Use  . Smoking status: Never Smoker  . Smokeless tobacco:  Never Used  Substance and Sexual Activity  . Alcohol use: Yes    Alcohol/week: 1.0 standard drinks    Types: 1 Glasses of wine per week    Comment: socially   . Drug use: No  . Sexual activity: Yes    Partners: Male    Birth control/protection: I.U.D., Condom  Lifestyle  . Physical activity    Days per week: 0 days    Minutes per session: 0 min  . Stress: Very much  Relationships  . Social Musician on phone: Not on file    Gets together: Not on file    Attends religious service: Never    Active member of club or organization: No    Attends meetings of clubs or organizations: Never    Relationship status: Never married  . Intimate partner violence    Fear of current or ex partner: No    Emotionally abused: No    Physically abused: No    Forced sexual activity: No  Other Topics Concern  . Not on file  Social History Narrative  . Not on file    Family History  Problem Relation Age of Onset  . Asthma Mother   . COPD Mother   . Hyperlipidemia Mother   . Hypertension Mother   . Stroke Mother   . Arthritis Father   . Diabetes Father   . Heart disease Father   . Hyperlipidemia Father   . Hypertension Father   . Kidney disease Father   . Anxiety disorder Father   . Asthma Sister   . Depression Sister   . OCD Sister   . Asthma Brother   . Depression Brother   . Uterine cancer Maternal Grandmother 79    Allergies  Allergen Reactions  . Clindamycin Anaphylaxis  . Prednisone Other (See Comments) and Nausea And Vomiting    States any steroids pancreatitis pancreatitis  . Dexamethasone Nausea And Vomiting  . Metformin And Related Other (See Comments)    Severe diarrhea  . Other Nausea And Vomiting    Steroid that starts with a C  - causes Pancreatis   . Morphine Anxiety  . Povidone Iodine Hives and Rash    Medication list reviewed and updated in full in [facility].  GEN: No fevers, chills. Nontoxic. Primarily MSK c/o today. MSK: Detailed  in the HPI GI: tolerating PO intake without difficulty Neuro: No numbness, parasthesias, or tingling associated. Otherwise the pertinent positives of the ROS are noted above.   Objective:   BP 104/82   Pulse 71   Temp 98 F (36.7 C) (Temporal)   Ht 5\' 7"  (1.702 m)   Wt 273 lb 4 oz (123.9 kg)   LMP 12/28/2018 Comment: neg preg test 12/28/18  SpO2 97%   BMI 42.80 kg/m    GEN: WDWN, NAD, Non-toxic, Alert & Oriented x 3 HEENT: Atraumatic, Normocephalic.  Ears and Nose: No external deformity. EXTR: No clubbing/cyanosis/edema NEURO: Normal gait.  PSYCH: Normally interactive.  Conversant. Not depressed or anxious appearing.  Calm demeanor.    She still is having some loss of range of motion in all directions.  Flexion and abduction have improved compared to last visit.  Internal range of motion is still notably worse compared to the contralateral side, but external rotation is improving.  Strength is 5/5 and neurovascularly intact  Radiology:  Assessment and Plan:     ICD-10-CM   1. Rotator cuff tendinitis, right  M75.81 Ambulatory referral to Physical Therapy  2. Adhesive capsulitis of right shoulder  M75.01 Ambulatory referral to Physical Therapy   To have her start going back to physical therapy again for an office treatment as well as home rehab follow-up 6 weeks.  Follow-up: Return in about 6 weeks (around 03/09/2019) for f/u shoulder.  No orders of the defined types were placed in this encounter.  Orders Placed This Encounter  Procedures  . Ambulatory referral to Physical Therapy    Signed,  Karleen HampshireSpencer T. Iriel Nason, MD   Outpatient Encounter Medications as of 01/26/2019  Medication Sig  . clonazePAM (KLONOPIN) 0.5 MG tablet Take 0.5-1 tablets (0.25-0.5 mg total) by mouth as directed. Only for severe anxiety attacks up to 2-3 times a week.  . famotidine (PEPCID) 40 MG tablet Take by mouth.  . fexofenadine (ALLEGRA) 180 MG tablet Take 180 mg by mouth daily as needed.   .  gabapentin (NEURONTIN) 100 MG capsule Take 1 capsule (100 mg total) by mouth 3 (three) times daily. Titrate up as directed  . penicillin v potassium (VEETID) 500 MG tablet Take 1 tablet (500 mg total) by mouth 4 (four) times daily.  . traZODone (DESYREL) 50 MG tablet Take 0.5-1 tablets (25-50 mg total) by mouth at bedtime as needed for sleep.  . [DISCONTINUED] doxycycline (VIBRAMYCIN) 100 MG capsule Take 1 capsule (100 mg total) by mouth 2 (two) times daily.   No facility-administered encounter medications on file as of 01/26/2019.

## 2019-01-29 ENCOUNTER — Ambulatory Visit (INDEPENDENT_AMBULATORY_CARE_PROVIDER_SITE_OTHER): Payer: BC Managed Care – PPO | Admitting: Clinical

## 2019-01-29 DIAGNOSIS — F332 Major depressive disorder, recurrent severe without psychotic features: Secondary | ICD-10-CM | POA: Diagnosis not present

## 2019-02-02 ENCOUNTER — Ambulatory Visit: Payer: BLUE CROSS/BLUE SHIELD | Admitting: Clinical

## 2019-02-05 ENCOUNTER — Ambulatory Visit (INDEPENDENT_AMBULATORY_CARE_PROVIDER_SITE_OTHER): Payer: BC Managed Care – PPO | Admitting: Clinical

## 2019-02-05 DIAGNOSIS — F3171 Bipolar disorder, in partial remission, most recent episode hypomanic: Secondary | ICD-10-CM | POA: Diagnosis not present

## 2019-02-05 DIAGNOSIS — F332 Major depressive disorder, recurrent severe without psychotic features: Secondary | ICD-10-CM

## 2019-02-09 ENCOUNTER — Ambulatory Visit: Payer: BLUE CROSS/BLUE SHIELD | Admitting: Clinical

## 2019-02-10 DIAGNOSIS — S46011D Strain of muscle(s) and tendon(s) of the rotator cuff of right shoulder, subsequent encounter: Secondary | ICD-10-CM | POA: Diagnosis not present

## 2019-02-10 DIAGNOSIS — M6281 Muscle weakness (generalized): Secondary | ICD-10-CM | POA: Diagnosis not present

## 2019-02-10 DIAGNOSIS — M7541 Impingement syndrome of right shoulder: Secondary | ICD-10-CM | POA: Diagnosis not present

## 2019-02-10 DIAGNOSIS — M25511 Pain in right shoulder: Secondary | ICD-10-CM | POA: Diagnosis not present

## 2019-02-12 ENCOUNTER — Ambulatory Visit: Payer: BC Managed Care – PPO | Admitting: Clinical

## 2019-02-14 ENCOUNTER — Ambulatory Visit (INDEPENDENT_AMBULATORY_CARE_PROVIDER_SITE_OTHER): Payer: BC Managed Care – PPO | Admitting: Clinical

## 2019-02-14 DIAGNOSIS — F3175 Bipolar disorder, in partial remission, most recent episode depressed: Secondary | ICD-10-CM

## 2019-02-16 ENCOUNTER — Ambulatory Visit: Payer: BLUE CROSS/BLUE SHIELD | Admitting: Clinical

## 2019-02-16 ENCOUNTER — Other Ambulatory Visit: Payer: Self-pay

## 2019-02-16 ENCOUNTER — Ambulatory Visit (INDEPENDENT_AMBULATORY_CARE_PROVIDER_SITE_OTHER): Payer: BC Managed Care – PPO | Admitting: Family Medicine

## 2019-02-16 ENCOUNTER — Encounter: Payer: Self-pay | Admitting: Family Medicine

## 2019-02-16 VITALS — BP 127/88 | HR 72 | Wt 265.4 lb

## 2019-02-16 DIAGNOSIS — N898 Other specified noninflammatory disorders of vagina: Secondary | ICD-10-CM | POA: Diagnosis not present

## 2019-02-16 DIAGNOSIS — Z23 Encounter for immunization: Secondary | ICD-10-CM

## 2019-02-16 DIAGNOSIS — Z124 Encounter for screening for malignant neoplasm of cervix: Secondary | ICD-10-CM

## 2019-02-16 DIAGNOSIS — Z1151 Encounter for screening for human papillomavirus (HPV): Secondary | ICD-10-CM | POA: Diagnosis not present

## 2019-02-16 DIAGNOSIS — Z01419 Encounter for gynecological examination (general) (routine) without abnormal findings: Secondary | ICD-10-CM

## 2019-02-16 NOTE — Progress Notes (Signed)
GYNECOLOGY ANNUAL PREVENTATIVE CARE ENCOUNTER NOTE  Subjective:  Kristie Hendricks is a 33 y.o. 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 Current complaints: Kristie Hendricks would like to discuss contraception options today, follow-up pap from abnormal results, and concerns for BV. She previously had a Mirena which was found to be expelled in January 2020. A copper IUD was then placed in June 2020, and in August 2020, it was removed due to being malpositioned and causing severe pain. She has previously been unable to tolerate hormonal birth controls, due to causing worsening of her bipolar and anxiety symptoms the week prior to menses. She would still like to wait a few years before having children and is open to having another copper IUD placed. She requests having it placed under ultrasound guidance.   Kristie Hendricks also has concerns that she has bacterial vaginosis, which she occasionally gets due to being on chronic penicillin treatment for cellulitis. She is currently off of penicillin. She reports a fishy odor which made her concerned for BV. She is also aware of her previous abnormal pap results showing ASC-US and the colposcopy results showing CIN I. Declines STI testing today, as she has had the same partner for over a year and had negative gonorrhea/chlamydia test on 12/27/18.   Denies changes in breasts, including lumps, erythema, swelling, or discharge. Denies abnormal vaginal bleeding, pelvic pain, and problems with intercourse.   Chief Complaint  Patient presents with  . Gynecologic Exam    Gynecologic History Patient's last menstrual period was 01/17/2019. Contraception: condoms and copper IUD, which was removed in August per HPI Last Pap: 10/07/17. Results were: abnormal, ASC-US and HPV detected; negative for HPV 16, 18, 45. 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  Last  mammogram: n/a.   Health Maintenance Due  Topic Date Due  . PAP SMEAR-Modifier  10/08/2018  . INFLUENZA VACCINE  12/25/2018    The following portions of the patient's history were reviewed and updated as appropriate: allergies, current medications, past family history, past medical history, past social history, past surgical history and problem list.  Review of Systems Pertinent items are noted in HPI.   Objective:  BP 127/88   Pulse 72   Wt 265 lb 6.4 oz (120.4 kg)   LMP 01/17/2019   BMI 41.57 kg/m  CONSTITUTIONAL: Well-developed, well-nourished female in no acute distress.  HENT:  Normocephalic, atraumatic, External right and left ear normal.  EYES:  No scleral icterus.  NECK: Normal range of motion. SKIN: Skin is warm and dry. No rash noted. Not diaphoretic. No erythema. No pallor. NEUROLOGIC: Alert and oriented to person, place, and time.  PSYCHIATRIC: Normal mood and affect. Normal behavior. Normal judgment and thought content. CARDIOVASCULAR: Normal heart rate noted. RESPIRATORY: Effort normal, no problems with respiration noted. BREASTS: Symmetric in size. No masses, skin changes, nipple drainage, or lymphadenopathy. ABDOMEN: Soft,  no distention noted.  PELVIC: Normal appearing external genitalia; normal appearing vaginal mucosa and cervix. Minimal white discharge noted in vagina. Pap smear and wet prep obtained.  MUSCULOSKELETAL: Normal range of motion.    Assessment and Plan:  1) Annual gynecologic examination with pap smear and wet prep:  Will follow up results of pap smear and wet prep and manage accordingly. Counseled pt on the meaning of her previous abnormal pap and colposcopy results and the possible results from today's pap. Pt understands  that she may need LEEP or cryotherapy if results show no change from last year. No STI screen ordered today.  Routine preventative health maintenance measures emphasized.  2) Contraception counseling: Reviewed birth control  options with pt, considering that her copper IUD had to be removed and that she is unable to tolerate hormonal IUD due to worsening mood symptoms. She would like to again try the copper IUD placed with ultrasound, this was prescribed for patient. Risks and benefits reviewed, including that it is possible for the copper IUD to become malpositioned again despite being placed with ultrasound. Pt thinks this is the best option for her, and notes that she is less concerned about it becoming malpositioned as she will know what to do if it happens again. Questions were answered.  Written information was also given to the patient to review.  She will schedule appointment for placement with ultrasound.  She was told to call with any further questions, or with any concerns about this method of contraception.  Emphasized use of condoms 100% of the time for STI prevention.   Please refer to After Visit Summary for other counseling recommendations.   No follow-ups on file.  Osvaldo Shipper, Medical Student  02/16/19

## 2019-02-16 NOTE — Progress Notes (Signed)
Last pap-  ATYPICAL SQUAMOUS CELLS OF UNDETERMINED SIGNIFICANCE (ASC-US)Abnormal   NO STI TESTING TODAY BUT WOULD LIKE TO BE CHECK FOR BV.  NO KIDS DISCUSS BIRTH CONTROL OPTIONS LMP: 01/20/2019

## 2019-02-16 NOTE — Progress Notes (Signed)
   GYNECOLOGY ANNUAL PREVENTATIVE CARE ENCOUNTER NOTE  Subjective:   Kristie Hendricks is a 33 y.o. G0P0000 female here for a routine annual gynecologic exam.  Current complaints: mood sx near cycle, planning on discussing with pysch.  Reports vaginal discharge with odor and is concerned about developing BV.  Denies abnormal vaginal bleeding, discharge, pelvic pain, problems with intercourse or other gynecologic concerns.    Gynecologic History Patient's last menstrual period was 01/17/2019. Contraception: condoms Last Pap: 09/2017. Results were: abnormal- ASCUS HPV pos, COLPO  CIN 1 in 10/2017. Needs pap today  Last mammogram: na. Results were: normal  The following portions of the patient's history were reviewed and updated as appropriate: allergies, current medications, past family history, past medical history, past social history, past surgical history and problem list.  Review of Systems Pertinent items are noted in HPI.   Objective:  BP 127/88   Pulse 72   Wt 265 lb 6.4 oz (120.4 kg)   LMP 01/17/2019   BMI 41.57 kg/m  CONSTITUTIONAL: Well-developed, well-nourished female in no acute distress.  HENT:  Normocephalic, atraumatic, External right and left ear normal. Oropharynx is clear and moist EYES:  No scleral icterus.  NECK: Normal range of motion, supple, no masses.  Normal thyroid.  SKIN: Skin is warm and dry. No rash noted. Not diaphoretic. No erythema. No pallor. NEUROLOGIC: Alert and oriented to person, place, and time. Normal reflexes, muscle tone coordination. No cranial nerve deficit noted. PSYCHIATRIC: Normal mood and affect. Normal behavior. Normal judgment and thought content. CARDIOVASCULAR: Normal heart rate noted, regular rhythm. 2+ distal pulses. RESPIRATORY: Effort and breath sounds normal, no problems with respiration noted. BREASTS: Symmetric in size. No masses, skin changes, nipple drainage, or lymphadenopathy. ABDOMEN: Soft,  no distention noted.  No tenderness,  rebound or guarding.  PELVIC: Normal appearing external genitalia; normal appearing vaginal mucosa and cervix.  No abnormal discharge noted.  Pap smear obtained.  Normal uterine size, no other palpable masses, no uterine or adnexal tenderness. MUSCULOSKELETAL: Normal range of motion.       Assessment and Plan:  1) Annual gynecologic examination with pap smear:  Will follow up results of pap smear and manage accordingly.  Declines STI screen.  BV testing collected.  Routine preventative health maintenance measures emphasized.  2) Contraception counseling: Reviewed all forms of birth control options available including abstinence; over the counter/barrier methods; hormonal contraceptive medication including pill, patch, ring, injection,contraceptive implant; hormonal and nonhormonal IUDs; permanent sterilization options including vasectomy and the various tubal sterilization modalities. Risks and benefits reviewed.  Questions were answered.  Written information was also given to the patient to review.  Patient desires cu-IUD placement under [place], this was prescribed for patient. She will follow up in  1-2 wks for placement.  She was told to call with any further questions, or with any concerns about this method of contraception.  Emphasized use of condoms 100% of the time for STI prevention.   Please refer to After Visit Summary for other counseling recommendations.   Return in about 1 week (around 02/23/2019) for IUD insertion, US guidance.  Caren Macadam, MD, MPH, ABFM Attending Physician Center for [facility]

## 2019-02-18 LAB — CYTOLOGY - PAP
Diagnosis: NEGATIVE
High risk HPV: NEGATIVE
Molecular Disclaimer: 56
Molecular Disclaimer: NORMAL

## 2019-02-18 LAB — CERVICOVAGINAL ANCILLARY ONLY
Bacterial Vaginitis (gardnerella): NEGATIVE
Molecular Disclaimer: NEGATIVE

## 2019-02-22 ENCOUNTER — Ambulatory Visit (INDEPENDENT_AMBULATORY_CARE_PROVIDER_SITE_OTHER): Payer: BC Managed Care – PPO | Admitting: Clinical

## 2019-02-22 DIAGNOSIS — F332 Major depressive disorder, recurrent severe without psychotic features: Secondary | ICD-10-CM | POA: Diagnosis not present

## 2019-02-23 ENCOUNTER — Ambulatory Visit: Payer: BLUE CROSS/BLUE SHIELD | Admitting: Clinical

## 2019-03-01 ENCOUNTER — Ambulatory Visit (INDEPENDENT_AMBULATORY_CARE_PROVIDER_SITE_OTHER): Payer: BC Managed Care – PPO | Admitting: Clinical

## 2019-03-01 DIAGNOSIS — F3171 Bipolar disorder, in partial remission, most recent episode hypomanic: Secondary | ICD-10-CM

## 2019-03-01 DIAGNOSIS — F332 Major depressive disorder, recurrent severe without psychotic features: Secondary | ICD-10-CM

## 2019-03-02 ENCOUNTER — Ambulatory Visit: Payer: BLUE CROSS/BLUE SHIELD | Admitting: Clinical

## 2019-03-08 ENCOUNTER — Ambulatory Visit (INDEPENDENT_AMBULATORY_CARE_PROVIDER_SITE_OTHER): Payer: BC Managed Care – PPO | Admitting: Clinical

## 2019-03-08 DIAGNOSIS — F332 Major depressive disorder, recurrent severe without psychotic features: Secondary | ICD-10-CM

## 2019-03-09 ENCOUNTER — Ambulatory Visit: Payer: BC Managed Care – PPO | Admitting: Family Medicine

## 2019-03-09 ENCOUNTER — Ambulatory Visit: Payer: BLUE CROSS/BLUE SHIELD | Admitting: Clinical

## 2019-03-14 DIAGNOSIS — F3181 Bipolar II disorder: Secondary | ICD-10-CM | POA: Diagnosis not present

## 2019-03-15 ENCOUNTER — Ambulatory Visit (INDEPENDENT_AMBULATORY_CARE_PROVIDER_SITE_OTHER): Payer: BC Managed Care – PPO | Admitting: Clinical

## 2019-03-15 DIAGNOSIS — F332 Major depressive disorder, recurrent severe without psychotic features: Secondary | ICD-10-CM

## 2019-03-16 ENCOUNTER — Ambulatory Visit: Payer: BLUE CROSS/BLUE SHIELD | Admitting: Clinical

## 2019-03-22 ENCOUNTER — Ambulatory Visit (INDEPENDENT_AMBULATORY_CARE_PROVIDER_SITE_OTHER): Payer: BC Managed Care – PPO | Admitting: Clinical

## 2019-03-22 DIAGNOSIS — F332 Major depressive disorder, recurrent severe without psychotic features: Secondary | ICD-10-CM | POA: Diagnosis not present

## 2019-03-23 ENCOUNTER — Ambulatory Visit: Payer: BLUE CROSS/BLUE SHIELD | Admitting: Clinical

## 2019-03-29 ENCOUNTER — Ambulatory Visit (INDEPENDENT_AMBULATORY_CARE_PROVIDER_SITE_OTHER): Payer: BC Managed Care – PPO | Admitting: Clinical

## 2019-03-29 DIAGNOSIS — F332 Major depressive disorder, recurrent severe without psychotic features: Secondary | ICD-10-CM

## 2019-03-30 ENCOUNTER — Ambulatory Visit: Payer: BLUE CROSS/BLUE SHIELD | Admitting: Clinical

## 2019-03-31 DIAGNOSIS — F3181 Bipolar II disorder: Secondary | ICD-10-CM | POA: Diagnosis not present

## 2019-04-05 ENCOUNTER — Ambulatory Visit (INDEPENDENT_AMBULATORY_CARE_PROVIDER_SITE_OTHER): Payer: BC Managed Care – PPO | Admitting: Clinical

## 2019-04-05 DIAGNOSIS — F332 Major depressive disorder, recurrent severe without psychotic features: Secondary | ICD-10-CM

## 2019-04-06 ENCOUNTER — Ambulatory Visit: Payer: BLUE CROSS/BLUE SHIELD | Admitting: Clinical

## 2019-04-12 ENCOUNTER — Ambulatory Visit (INDEPENDENT_AMBULATORY_CARE_PROVIDER_SITE_OTHER): Payer: BC Managed Care – PPO | Admitting: Clinical

## 2019-04-12 DIAGNOSIS — F332 Major depressive disorder, recurrent severe without psychotic features: Secondary | ICD-10-CM

## 2019-04-13 ENCOUNTER — Ambulatory Visit: Payer: BLUE CROSS/BLUE SHIELD | Admitting: Clinical

## 2019-04-15 ENCOUNTER — Ambulatory Visit
Admission: EM | Admit: 2019-04-15 | Discharge: 2019-04-15 | Disposition: A | Discharge status: Home / Self Care | Payer: BC Managed Care – PPO | Attending: Emergency Medicine | Admitting: Emergency Medicine

## 2019-04-15 ENCOUNTER — Encounter: Payer: Self-pay | Admitting: Emergency Medicine

## 2019-04-15 ENCOUNTER — Telehealth: Payer: Self-pay

## 2019-04-15 ENCOUNTER — Other Ambulatory Visit: Payer: Self-pay

## 2019-04-15 DIAGNOSIS — R079 Chest pain, unspecified: Secondary | ICD-10-CM

## 2019-04-15 DIAGNOSIS — Z0189 Encounter for other specified special examinations: Secondary | ICD-10-CM

## 2019-04-15 NOTE — ED Triage Notes (Signed)
Patient in office for c/o chest pain x 2wks    EBX:[id number],JDBZMC,EYE

## 2019-04-15 NOTE — Telephone Encounter (Signed)
I spoke with pt and for 2 wks pt has pain in chest that covers the entire chest and radiates to rt shoulder.pt said the first time she had the CP she thought she was having a heart attack. Pt said both calves are cramping, both lower legs are always swollen, no redness and no warmth.last night pt had H/A and thinks BP is elevated but pt does not have a way to ck BP. When pt has CP and pt takes a deep breath it causes the CP to hurt worse. No SOB. Pt has taken allegra for stuffy ears,watery eyes and sneezing. Pt has not taken clonazepam because if she takes that she cannot function. Pt will go to UC. FYI to Gentry Fitz NP.

## 2019-04-15 NOTE — Telephone Encounter (Signed)
Noted  

## 2019-04-15 NOTE — ED Provider Notes (Signed)
Renaldo Fiddler    CSN: [id number] Arrival date & time: 04/15/19  1115      History   Chief Complaint No chief complaint on file.   HPI Kristie Hendricks is a 34 y.o. female.   Patient presents with intermittent chest pain x2 weeks.  She reports some shortness of breath when she is having the chest pain.  No symptoms currently.  She rates the pain 6/10, describes it as sharp all across her chest, no aggravating or alleviating factors.  She denies fever, chills, cough, abdominal pain, vomiting, diarrhea, dizziness, headache, weakness, or other symptoms.  She has attempted treatment at home with Tylenol and Aleve and aspirin.  She states she attempted to be seen by her PCP today but was told she could not be seen unless she had a negative COVID.  Patient requests COVID test today.  The history is provided by the patient.    Past Medical History:  Diagnosis Date   Anxiety    Asthma    Bipolar disorder (HCC)    Chickenpox    Depression    Frequent headaches    Hypertension    Migraines    Pancreatitis     Patient Active Problem List   Diagnosis Date Noted   Mixed bipolar I disorder (HCC) 12/31/2018   Panic attack 12/31/2018   Insomnia due to mental condition 12/31/2018   Pelvic pain 12/27/2018   Epigastric pain 12/17/2018   Left groin pain 12/17/2018   Numbness and tingling 09/13/2018   Chronic neck pain 09/13/2018   Epiblepharon 08/11/2018   Entropion of right lower eyelid 08/11/2018   Blepharitis 08/11/2018   Allergic conjunctivitis 08/11/2018   Punctate keratitis 08/11/2018   Edema 07/20/2018   Cellulitis and abscess of left lower extremity 06/07/2018   Lower abdominal pain 06/01/2018   Chronic back pain 03/09/2018   Preventative health care 03/09/2018   Abnormal Pap smear of cervix 11/06/2017   Morbid obesity (HCC) 10/28/2016   Status post gastric bypass for obesity 10/28/2016   Lymphedema of lower extremity 10/28/2016    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   Bipolar disorder (HCC) 10/28/2016   Hypertension 10/28/2016   Polycystic ovary syndrome 10/28/2016   Weight gain 09/03/2016   Sciatica of right side 02/07/2016   Trochanteric bursitis of both hips 01/15/2016   Lymphedema 04/01/2014   Sacroiliac dysfunction 03/31/2014   Hyperlipidemia 03/30/2014   Entropion 01/11/2014   GERD without esophagitis 12/10/2013   Paresthesias/numbness 10/20/2013   Allergic rhinitis 10/16/2013   Shortness of breath 10/16/2013   Bipolar I disorder, most recent episode (or current) depressed, severe, specified as with psychotic behavior (HCC) 10/13/2013   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 Authorizing Provider  clonazePAM (KLONOPIN) 0.5 MG tablet Take 0.5-1 tablets (0.25-0.5 mg total) by mouth as directed. Only for severe anxiety attacks up to 2-3 times a week. 08/11/18  Yes Jomarie Longs, MD  fexofenadine (ALLEGRA) 180 MG tablet Take 180 mg by mouth daily as needed.    Yes [provider]  lurasidone (LATUDA) 40 MG TABS tablet Take 40 mg by mouth daily with breakfast.   Yes [provider]  traZODone (DESYREL) 50 MG tablet Take 0.5-1 tablets (25-50 mg total) by mouth at bedtime as needed for sleep. 07/20/18  Yes Jomarie Longs, MD  famotidine (PEPCID) 40  MG tablet Take by mouth.    [provider]  gabapentin (NEURONTIN) 100 MG capsule Take 1 capsule (100 mg total) by mouth 3 (three) times daily. Titrate up as directed Patient not taking: Reported on 02/16/2019 09/29/18   Copland, Karleen Hampshire, MD  penicillin v potassium (VEETID) 500 MG tablet Take 1 tablet (500 mg total) by mouth 4 (four) times daily. Patient not taking: Reported on 02/16/2019 11/15/18   Cliffton Asters, MD    Family History Family History  Problem Relation Age of Onset   Asthma Mother    COPD Mother    Hyperlipidemia Mother    Hypertension Mother    Stroke Mother    Arthritis Father    Diabetes Father    Heart disease Father    Hyperlipidemia Father    Hypertension Father    Kidney disease Father    Anxiety disorder Father    Asthma Sister    Depression Sister    OCD Sister    Asthma Brother    Depression Brother    Uterine cancer Maternal Grandmother 55    Social History Social History   Tobacco Use   Smoking status: Never Smoker   Smokeless tobacco: Never Used  Substance Use Topics   Alcohol use: Yes    Alcohol/week: 1.0 standard drinks    Types: 1 Glasses of wine per week    Comment: socially    Drug use: No     Allergies   Clindamycin, Prednisone, Dexamethasone, Metformin and related, Other, Morphine, and Povidone iodine   Review of Systems Review of Systems  Constitutional: Negative for chills and fever.  HENT: Negative for ear pain and sore throat.   Eyes: Negative for pain and visual disturbance.  Respiratory: Positive for shortness of breath. Negative for cough.   Cardiovascular: Positive for chest pain. Negative for palpitations and leg swelling.  Gastrointestinal: Negative for abdominal pain and vomiting.  Genitourinary: Negative for dysuria and hematuria.  Musculoskeletal: Negative for arthralgias and back pain.  Skin: Negative for color change and rash.  Neurological: Negative for dizziness, tremors,  seizures, syncope, facial asymmetry, speech difficulty, weakness, light-headedness, numbness and headaches.  All other systems reviewed and are negative.    Physical Exam Triage Vital Signs ED Triage Vitals  Enc Vitals Group     BP      Pulse      Resp      Temp      Temp src      SpO2      Weight      Height      Head Circumference      Peak Flow      Pain Score      Pain Loc      Pain Edu?  Excl. in [place]?    No data found.  Updated Vital Signs BP 137/80    Pulse 69    Temp 98.1 F (36.7 C) (Oral)    Resp 18    Wt 275 lb (124.7 kg)    LMP 04/10/2019    SpO2 96%    BMI 43.07 kg/m   Visual Acuity Right Eye Distance:   Left Eye Distance:   Bilateral Distance:    Right Eye Near:   Left Eye Near:    Bilateral Near:     Physical Exam Vitals signs and nursing note reviewed.  Constitutional:      General: She is not in acute distress.    Appearance: She is well-developed. She is not ill-appearing.  HENT:     Head: Normocephalic and atraumatic.     Right Ear: Tympanic membrane normal.     Left Ear: Tympanic membrane normal.     Nose: Nose normal.     Mouth/Throat:     Mouth: Mucous membranes are moist.     Pharynx: Oropharynx is clear.  Eyes:     Conjunctiva/sclera: Conjunctivae normal.  Neck:     Musculoskeletal: Neck supple.  Cardiovascular:     Rate and Rhythm: Normal rate and regular rhythm.     Heart sounds: Normal heart sounds. No murmur.  Pulmonary:     Effort: Pulmonary effort is normal. No respiratory distress.     Breath sounds: No wheezing or rhonchi.  Abdominal:     General: Bowel sounds are normal.     Palpations: Abdomen is soft.     Tenderness: There is no abdominal tenderness. There is no guarding or rebound.  Skin:    General: Skin is warm and dry.     Findings: No rash.  Neurological:     General: No focal deficit present.     Mental Status: She is alert and oriented to person, place, and time.     Sensory: No sensory deficit.      Motor: No weakness.     Gait: Gait normal.      UC Treatments / Results  Labs (all labs ordered are listed, but only abnormal results are displayed) Labs Reviewed  NOVEL CORONAVIRUS, NAA    EKG   Radiology No results found.  Procedures Procedures (including critical care time)  Medications Ordered in UC Medications - No data to display  Initial Impression / Assessment and Plan / UC Course  I have reviewed the triage vital signs and the nursing notes.  Pertinent labs & imaging results that were available during my care of the patient were reviewed by me and considered in my medical decision making (see chart for details).   Chest pain.  Patient request for a COVID test.  Patient is well-appearing and her exam is unremarkable.  EKG shows normal sinus rhythm with rate of 64, no ST elevation, no previous to compare.  Discussed with patient that she should go to the emergency department if she has recurrent chest pain or shortness of breath.  COVID test performed here.  Instructed patient to self quarantine until the test result is back.  Instructed patient to go to the emergency department if she develops high fever, shortness of breath, severe diarrhea, or other concerning symptoms.  Patient agrees with plan of care.     Final Clinical Impressions(s) / UC Diagnoses   Final diagnoses:  Chest pain, unspecified type  Patient request for diagnostic testing     Discharge Instructions  Your EKG appears normal.    Go to the emergency department if you have chest pain or shortness of breath.    Your COVID test is pending.  You should self quarantine until your test result is back and is negative.    Go to the emergency department if you develop high fever, shortness of breath, severe diarrhea, or other concerning symptoms.       ED Prescriptions    None     PDMP not reviewed this encounter.   Mickie Bailate, Cece Milhouse H, NP 04/15/19 [phone]

## 2019-04-15 NOTE — Discharge Instructions (Addendum)
Your EKG appears normal.    Go to the emergency department if you have chest pain or shortness of breath.    Your COVID test is pending.  You should self quarantine until your test result is back and is negative.    Go to the emergency department if you develop high fever, shortness of breath, severe diarrhea, or other concerning symptoms.

## 2019-04-17 LAB — NOVEL CORONAVIRUS, NAA: SARS-CoV-2, NAA: NOT DETECTED

## 2019-04-19 ENCOUNTER — Ambulatory Visit (INDEPENDENT_AMBULATORY_CARE_PROVIDER_SITE_OTHER): Payer: BC Managed Care – PPO | Admitting: Clinical

## 2019-04-19 ENCOUNTER — Telehealth: Payer: Self-pay

## 2019-04-19 DIAGNOSIS — F332 Major depressive disorder, recurrent severe without psychotic features: Secondary | ICD-10-CM | POA: Diagnosis not present

## 2019-04-19 NOTE — Telephone Encounter (Signed)
Patient contacted the office on 11/20 and complained of chest pain. Patient states that she did go to UC and was worked up and states that they told her everything is fine, but that there may be some underlying cardiac issues going on that they cannot see on the tests they completed.  Patient states she is still having these chest pains that come and go, and they radiate to her right arm. Patient is not currently having chest pain, but would like this to be looked at before the holidays. Patient is requesting an appt with Kristie Hendricks. I advised that with her having chest pain, I would need to have her triaged. I contacted the [facility] center and they state that I should ask the patient's provider as to what the patient needs to do since she has already been worked up at Urgent Care. Kristie Hendricks advised that the patient may need a CT angio PT but patient states that she wants to follow up here, will route to [facility]

## 2019-04-19 NOTE — Telephone Encounter (Signed)
I spoke with Kristie Hendricks and pt cannot come in today and Kristie Hendricks schedule is full on 04/20/19 so pt scheduled appt with Dr Einar Pheasant on 04/20/19; pt said she has not had CP since 04/15/19.pt wonders if magnesium could be off. Pt has no covid symptoms, no travel and no known exposure to + covid. ED precautions given and pt voiced understanding. FYI to Dr Einar Pheasant.

## 2019-04-20 ENCOUNTER — Other Ambulatory Visit: Payer: Self-pay

## 2019-04-20 ENCOUNTER — Ambulatory Visit: Payer: BLUE CROSS/BLUE SHIELD | Admitting: Clinical

## 2019-04-20 ENCOUNTER — Encounter: Payer: Self-pay | Admitting: Family Medicine

## 2019-04-20 ENCOUNTER — Ambulatory Visit (INDEPENDENT_AMBULATORY_CARE_PROVIDER_SITE_OTHER): Payer: BC Managed Care – PPO | Admitting: Family Medicine

## 2019-04-20 VITALS — BP 112/68 | HR 74 | Temp 97.7°F | Ht 67.0 in | Wt 278.0 lb

## 2019-04-20 DIAGNOSIS — R0789 Other chest pain: Secondary | ICD-10-CM | POA: Diagnosis not present

## 2019-04-20 LAB — COMPREHENSIVE METABOLIC PANEL
ALT: 22 U/L (ref 0–35)
AST: 21 U/L (ref 0–37)
Albumin: 3.8 g/dL (ref 3.5–5.2)
Alkaline Phosphatase: 101 U/L (ref 39–117)
BUN: 15 mg/dL (ref 6–23)
CO2: 28 mEq/L (ref 19–32)
Calcium: 8.7 mg/dL (ref 8.4–10.5)
Chloride: 106 mEq/L (ref 96–112)
Creatinine, Ser: 0.59 mg/dL (ref 0.40–1.20)
GFR: 118.02 mL/min (ref >60.00)
Glucose, Bld: 95 mg/dL (ref 70–99)
Potassium: 4 mEq/L (ref 3.5–5.1)
Sodium: 140 mEq/L (ref 135–145)
Total Bilirubin: 0.8 mg/dL (ref 0.2–1.2)
Total Protein: 7 g/dL (ref 6.0–8.3)

## 2019-04-20 LAB — MAGNESIUM: Magnesium: 2 mg/dL (ref 1.5–2.5)

## 2019-04-20 NOTE — Progress Notes (Signed)
Subjective:     Kristie Hendricks is a 33 y.o. female presenting for Chest Pain (Started about 2 weeks ago more on the right side of her chest and radiates to the left side and up her right side to her shoulder and back. She doe snot have a Gallbladder. No acid reflux. No heavy lifting. Has been under alot of stress but does not think she is anxious when it happens.States feels BP has been up and has had headaches. Had EKG at [place] on 04-15-19 )     Chest Pain  This is a new problem. The current episode started 1 to 4 weeks ago. The onset quality is gradual. The problem occurs every several days. The problem has been waxing and waning. The pain is present in the lateral region and substernal region. The pain is severe. The quality of the pain is described as stabbing. The pain radiates to the right arm and mid back. Associated symptoms include leg pain. Pertinent negatives include no abdominal pain, diaphoresis, exertional chest pressure, nausea, palpitations, shortness of breath or vomiting. The pain is aggravated by nothing. She has tried NSAIDs for the symptoms. The treatment provided mild relief. Risk factors include lack of exercise and stress.   Endorses that she has anxiety and gets pain on the right side of her chest. The new chest pain is still right side, but feels different  Increased stress - boyfriend was in the ICU for DKA and father was diagnosed with cancer  CP has happened outside of feeling anxious - though recognizes that she more stress in life  Did not clonazepam because of concern for needing to drive somewhere  Seeing psychiatry - started Lurasidone recently when new stressors  Review of Systems  Constitutional: Positive for fatigue. Negative for diaphoresis.  Respiratory: Positive for chest tightness. Negative for shortness of breath.   Cardiovascular: Positive for chest pain. Negative for palpitations.  Gastrointestinal: Negative for abdominal pain, nausea and  vomiting.       No heartburn  Psychiatric/Behavioral: The patient is nervous/anxious.      Social History   Tobacco Use  Smoking Status Never Smoker  Smokeless Tobacco Never Used        Objective:    BP Readings from Last 3 Encounters:  04/20/19 112/68  04/15/19 137/80  02/16/19 127/88   Wt Readings from Last 3 Encounters:  04/20/19 278 lb (126.1 kg)  04/15/19 275 lb (124.7 kg)  02/16/19 265 lb 6.4 oz (120.4 kg)    BP 112/68 (BP Location: Left Arm, Patient Position: Sitting, Cuff Size: Large)   Pulse 74   Temp 97.7 F (36.5 C)   Ht 5\' 7"  (1.702 m)   Wt 278 lb (126.1 kg)   LMP 04/10/2019   SpO2 98%   BMI 43.54 kg/m    Physical Exam Constitutional:      General: She is not in acute distress.    Appearance: She is well-developed. She is not diaphoretic.  HENT:     Right Ear: External ear normal.     Left Ear: External ear normal.     Nose: Nose normal.  Eyes:     Conjunctiva/sclera: Conjunctivae normal.  Neck:     Musculoskeletal: Neck supple.  Cardiovascular:     Rate and Rhythm: Normal rate and regular rhythm.     Heart sounds: Normal heart sounds. No murmur.  Pulmonary:     Effort: Pulmonary effort is normal. No respiratory distress.     Breath  sounds: Normal breath sounds.  Chest:     Chest wall: No mass, tenderness or crepitus.  Skin:    General: Skin is warm and dry.     Capillary Refill: Capillary refill takes less than 2 seconds.  Neurological:     Mental Status: She is alert. Mental status is at baseline.  Psychiatric:        Mood and Affect: Mood normal.        Behavior: Behavior normal.           Assessment & Plan:   Problem List Items Addressed This Visit      Other   Atypical chest pain - Primary    Reassurance provided given outside EKG wnl. Will get labs today to evaluate electrolytes and liver/kidney function. Pt with more stress and recently started new medication (followed by psych). Advised trying her clonazepam if next  episode happens when she is able to try. Return if worsening or not improving.       Relevant Orders   Comprehensive metabolic panel   Magnesium     This visit occurred during the SARS-CoV-2 public health emergency.  Safety protocols were in place, including screening questions prior to the visit, additional usage of staff PPE, and extensive cleaning of exam room while observing appropriate contact time as indicated for disinfecting solutions.     Return if symptoms worsen or fail to improve.  Lynnda Child, MD

## 2019-04-20 NOTE — Patient Instructions (Signed)
Chest pain - we will get labs today - your EKG was reassuring - Continue to monitor symptoms - you can try your anxiety medication at some point

## 2019-04-20 NOTE — Assessment & Plan Note (Signed)
Reassurance provided given outside EKG wnl. Will get labs today to evaluate electrolytes and liver/kidney function. Pt with more stress and recently started new medication (followed by psych). Advised trying her clonazepam if next episode happens when she is able to try. Return if worsening or not improving.

## 2019-04-20 NOTE — Telephone Encounter (Signed)
See note from today

## 2019-04-23 IMAGING — DX CERVICAL SPINE - COMPLETE 4+ VIEW
6 series · 6 of 6 positions shown · non-contrast
Comparison: None.

CLINICAL DATA: 32-year-old female with chronic cervical spine,
right biceps and right upper extremity pain with additional right
upper extremity numbness.

EXAM:
CERVICAL SPINE - COMPLETE 4+ VIEW

[c-spine lat]
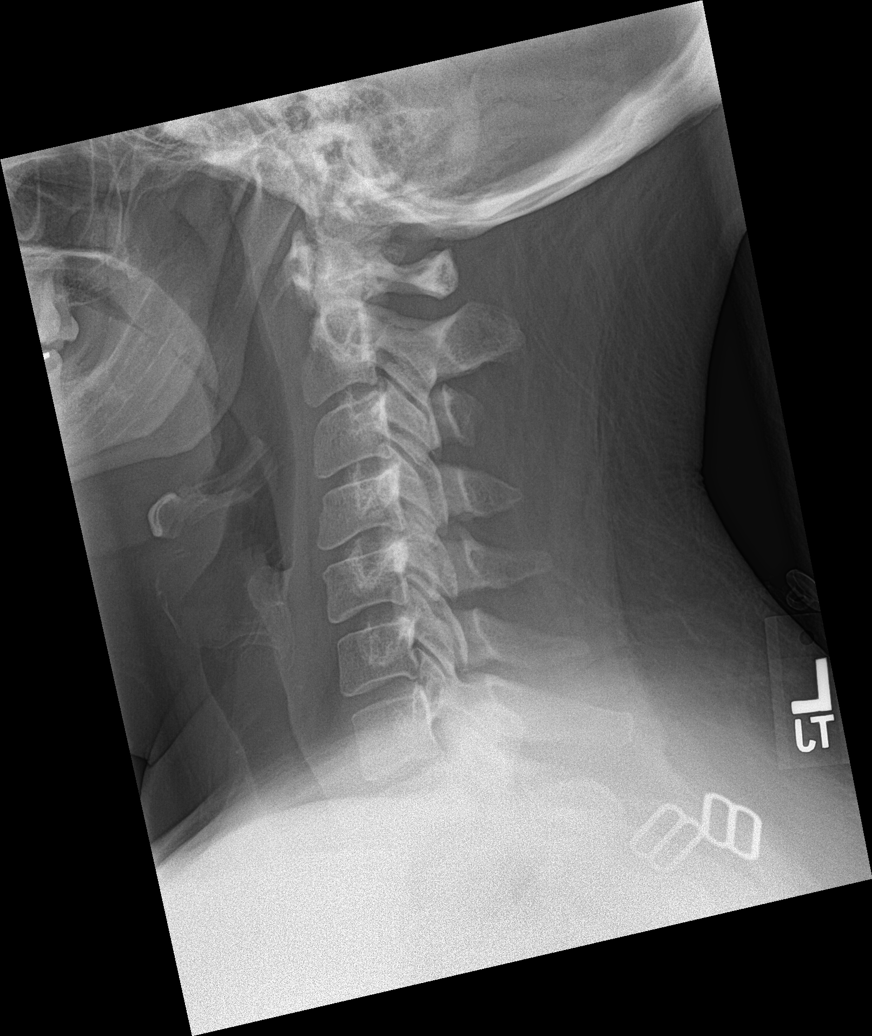

[c-spine obl (1 of 2)]
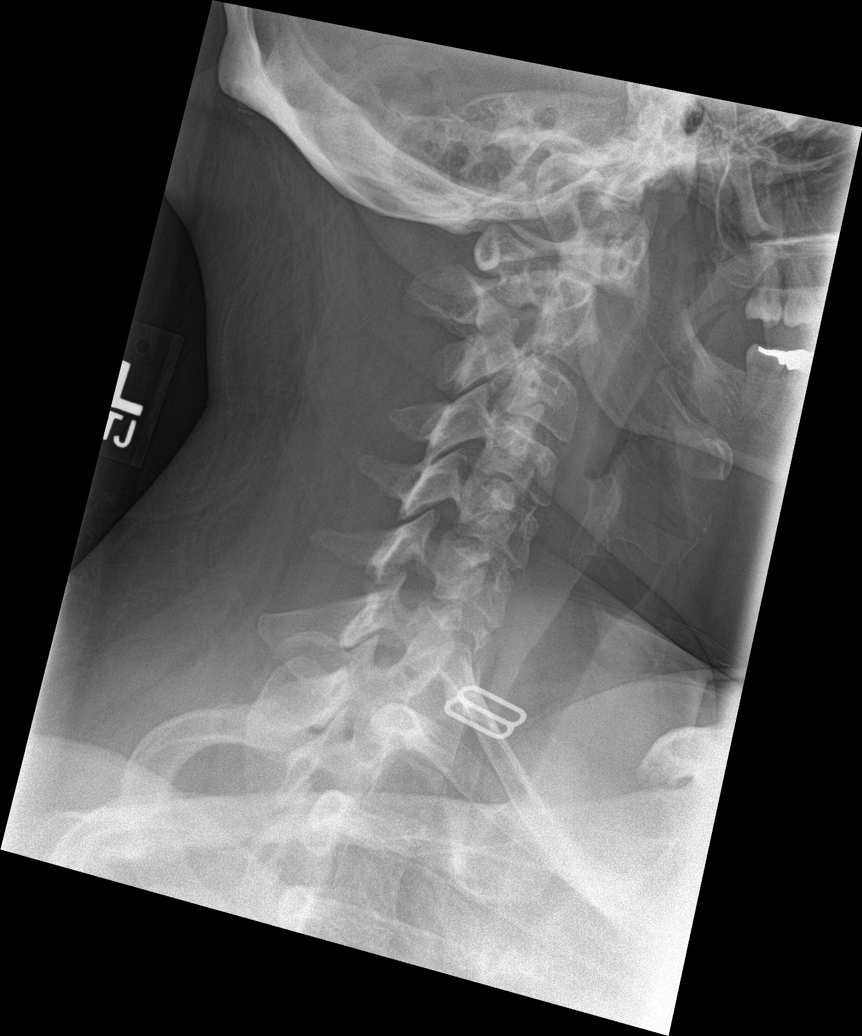

[c-spine obl (2 of 2)]
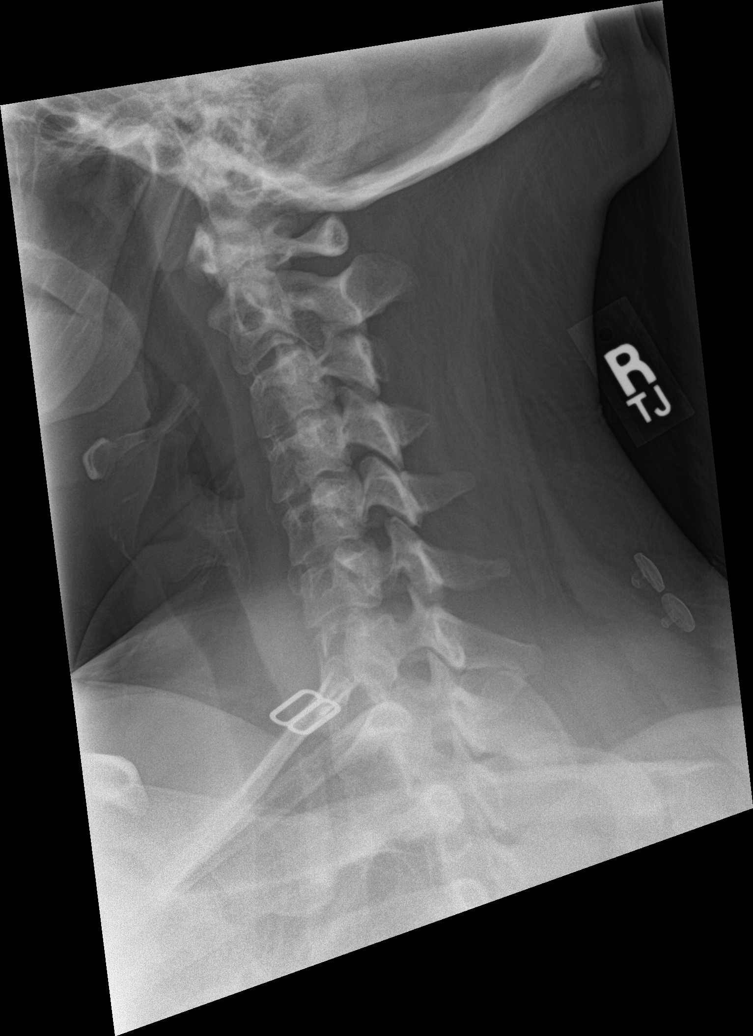

[c-spine ap]
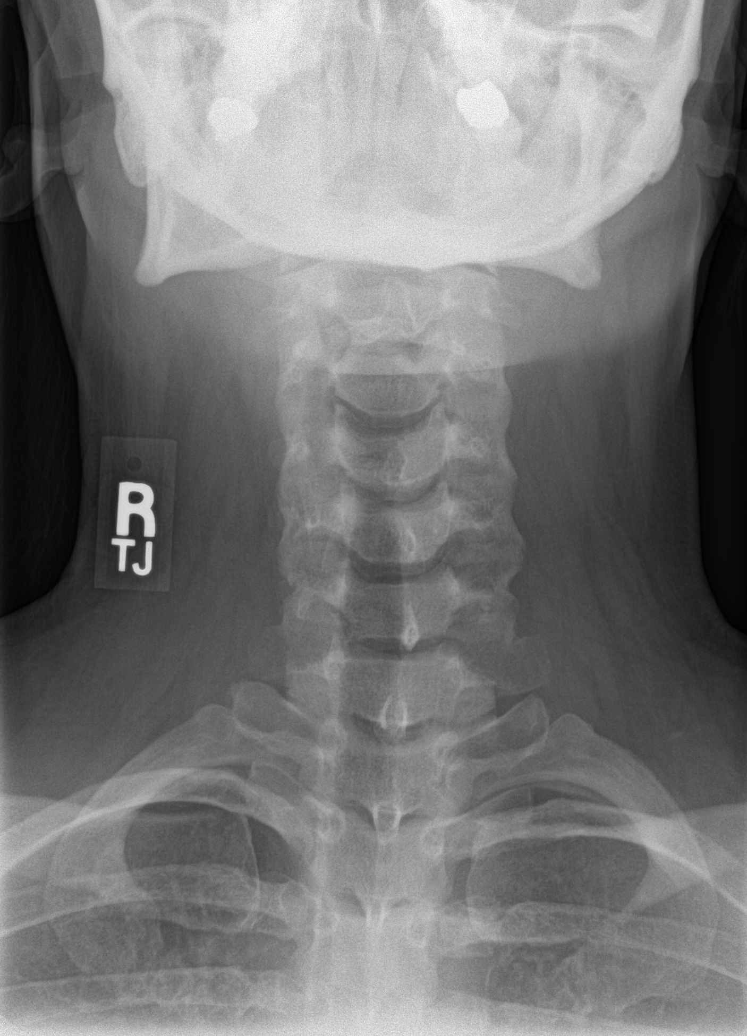

[c-spine open mouth]
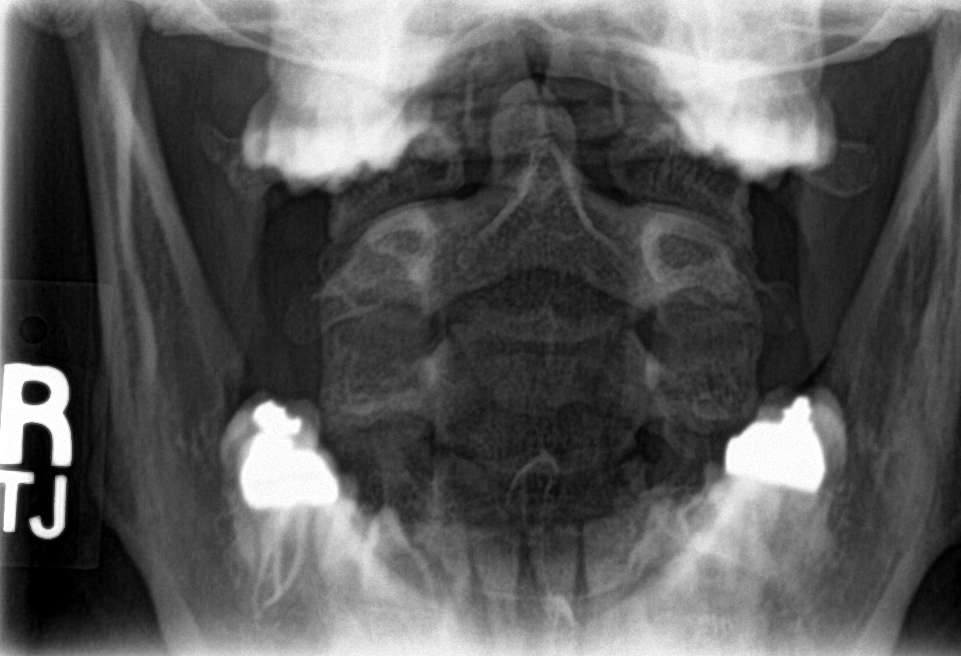

[c-spine swimmers]
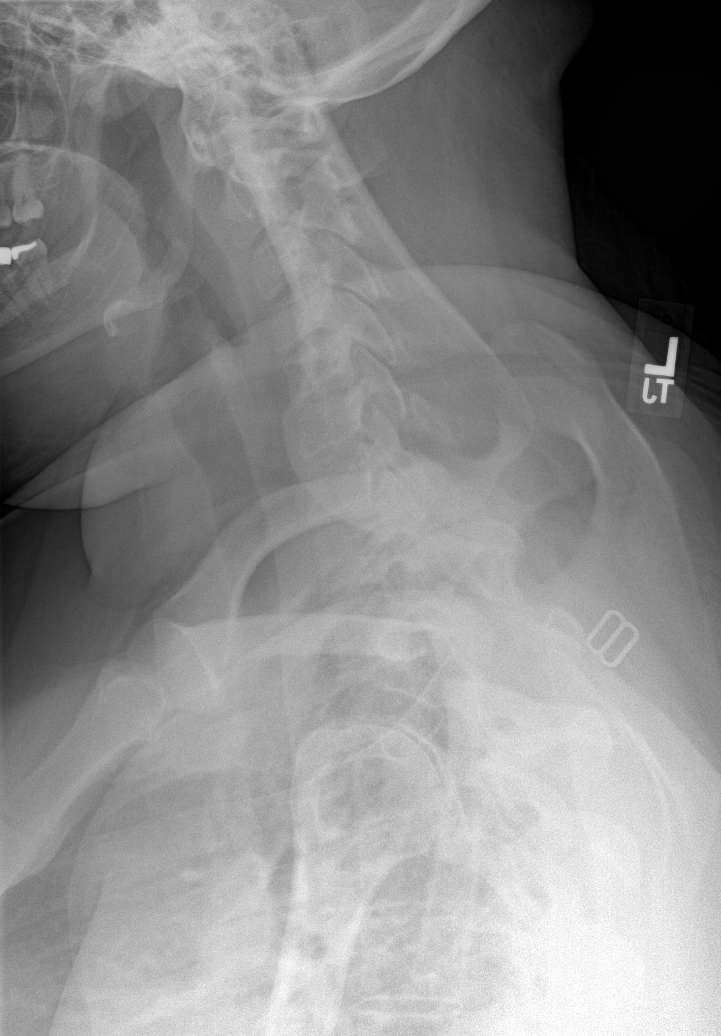

[6 of 6 positions shown; findings below may reference images not displayed]

FINDINGS: No evidence of fracture or malalignment. No prevertebral soft tissue
thickening. No significant degenerative disc disease. However, on
the oblique views there is mild narrowing of the right L3-L4 neural
foramen. No evidence of lytic or blastic osseous lesion.
IMPRESSION: 1. Mild narrowing of the right L3-L4 neural foramen.
2. No evidence of fracture, malalignment or additional degenerative
changes.

## 2019-04-26 ENCOUNTER — Ambulatory Visit (INDEPENDENT_AMBULATORY_CARE_PROVIDER_SITE_OTHER): Payer: BC Managed Care – PPO | Admitting: Clinical

## 2019-04-26 DIAGNOSIS — F332 Major depressive disorder, recurrent severe without psychotic features: Secondary | ICD-10-CM | POA: Diagnosis not present

## 2019-04-27 ENCOUNTER — Ambulatory Visit: Payer: BLUE CROSS/BLUE SHIELD | Admitting: Clinical

## 2019-05-03 ENCOUNTER — Ambulatory Visit: Payer: BC Managed Care – PPO | Admitting: Clinical

## 2019-05-03 DIAGNOSIS — S46011D Strain of muscle(s) and tendon(s) of the rotator cuff of right shoulder, subsequent encounter: Secondary | ICD-10-CM | POA: Diagnosis not present

## 2019-05-03 DIAGNOSIS — M7541 Impingement syndrome of right shoulder: Secondary | ICD-10-CM | POA: Diagnosis not present

## 2019-05-03 DIAGNOSIS — M25511 Pain in right shoulder: Secondary | ICD-10-CM | POA: Diagnosis not present

## 2019-05-03 DIAGNOSIS — M6281 Muscle weakness (generalized): Secondary | ICD-10-CM | POA: Diagnosis not present

## 2019-05-04 ENCOUNTER — Ambulatory Visit: Payer: BLUE CROSS/BLUE SHIELD | Admitting: Clinical

## 2019-05-07 ENCOUNTER — Ambulatory Visit (INDEPENDENT_AMBULATORY_CARE_PROVIDER_SITE_OTHER): Payer: BC Managed Care – PPO | Admitting: Clinical

## 2019-05-07 DIAGNOSIS — F332 Major depressive disorder, recurrent severe without psychotic features: Secondary | ICD-10-CM | POA: Diagnosis not present

## 2019-05-10 ENCOUNTER — Other Ambulatory Visit: Payer: Self-pay

## 2019-05-10 ENCOUNTER — Ambulatory Visit (INDEPENDENT_AMBULATORY_CARE_PROVIDER_SITE_OTHER): Payer: BC Managed Care – PPO | Admitting: Internal Medicine

## 2019-05-10 ENCOUNTER — Ambulatory Visit (INDEPENDENT_AMBULATORY_CARE_PROVIDER_SITE_OTHER): Payer: BC Managed Care – PPO | Admitting: Clinical

## 2019-05-10 DIAGNOSIS — F3171 Bipolar disorder, in partial remission, most recent episode hypomanic: Secondary | ICD-10-CM

## 2019-05-10 DIAGNOSIS — F332 Major depressive disorder, recurrent severe without psychotic features: Secondary | ICD-10-CM | POA: Diagnosis not present

## 2019-05-10 DIAGNOSIS — L03119 Cellulitis of unspecified part of limb: Secondary | ICD-10-CM

## 2019-05-10 NOTE — Progress Notes (Signed)
Virtual Visit via Telephone Note  I connected with Kristie Hendricks on 05/10/19 at  8:45 AM EST by telephone and verified that I am speaking with the correct person using two identifiers.  Location: Patient: Home Provider: [facility]   I discussed the limitations, risks, security and privacy concerns of performing an evaluation and management service by telephone and the availability of in person appointments. I also discussed with the patient that there may be a patient responsible charge related to this service. The patient expressed understanding and agreed to proceed.   History of Present Illness: I spoke with Kristie Hendricks by phone today.  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  She decided to stop suppressive IM penicillin injections in June.  Fortunately she has not had any episodes of recurrent cellulitis since that time.  She keeps oral penicillin with her at all times for self-directed therapy in case she starts to develop another episode.  She has had less swelling of her legs.  She is not working as much since the start of the Covid pandemic.  She has been under more stress after learning that her father has cancer 2 months ago but so far she has been able to manage that.   Observations/Objective:   Assessment and Plan: She is doing well off of regular penicillin injections with no further episodes of lower extremity cellulitis.  Follow Up Instructions: Follow-up here in 6 months   I discussed the assessment and treatment plan with the patient. The patient was provided an opportunity to ask questions and all were answered. The patient agreed with the plan and demonstrated an understanding of the instructions.   The patient was advised to call back or seek an in-person evaluation if the symptoms worsen or if the condition fails to improve as anticipated.  I provided 12 minutes of non-face-to-face time during this encounter.   Michel Bickers, MD

## 2019-05-11 ENCOUNTER — Ambulatory Visit: Payer: BLUE CROSS/BLUE SHIELD | Admitting: Clinical

## 2019-05-17 ENCOUNTER — Ambulatory Visit (INDEPENDENT_AMBULATORY_CARE_PROVIDER_SITE_OTHER): Payer: BC Managed Care – PPO | Admitting: Clinical

## 2019-05-17 DIAGNOSIS — F332 Major depressive disorder, recurrent severe without psychotic features: Secondary | ICD-10-CM | POA: Diagnosis not present

## 2019-05-17 DIAGNOSIS — M25511 Pain in right shoulder: Secondary | ICD-10-CM | POA: Diagnosis not present

## 2019-05-17 DIAGNOSIS — S46011D Strain of muscle(s) and tendon(s) of the rotator cuff of right shoulder, subsequent encounter: Secondary | ICD-10-CM | POA: Diagnosis not present

## 2019-05-17 DIAGNOSIS — F3181 Bipolar II disorder: Secondary | ICD-10-CM | POA: Diagnosis not present

## 2019-05-17 DIAGNOSIS — M6281 Muscle weakness (generalized): Secondary | ICD-10-CM | POA: Diagnosis not present

## 2019-05-17 DIAGNOSIS — M7541 Impingement syndrome of right shoulder: Secondary | ICD-10-CM | POA: Diagnosis not present

## 2019-05-18 ENCOUNTER — Ambulatory Visit: Payer: BLUE CROSS/BLUE SHIELD | Admitting: Clinical

## 2019-05-25 ENCOUNTER — Ambulatory Visit: Payer: BLUE CROSS/BLUE SHIELD | Admitting: Clinical

## 2019-05-31 ENCOUNTER — Ambulatory Visit (INDEPENDENT_AMBULATORY_CARE_PROVIDER_SITE_OTHER): Payer: BC Managed Care – PPO | Admitting: Clinical

## 2019-05-31 DIAGNOSIS — S46011D Strain of muscle(s) and tendon(s) of the rotator cuff of right shoulder, subsequent encounter: Secondary | ICD-10-CM | POA: Diagnosis not present

## 2019-05-31 DIAGNOSIS — F332 Major depressive disorder, recurrent severe without psychotic features: Secondary | ICD-10-CM | POA: Diagnosis not present

## 2019-05-31 DIAGNOSIS — M25511 Pain in right shoulder: Secondary | ICD-10-CM | POA: Diagnosis not present

## 2019-05-31 DIAGNOSIS — M6281 Muscle weakness (generalized): Secondary | ICD-10-CM | POA: Diagnosis not present

## 2019-05-31 DIAGNOSIS — M7541 Impingement syndrome of right shoulder: Secondary | ICD-10-CM | POA: Diagnosis not present

## 2019-06-07 ENCOUNTER — Ambulatory Visit (INDEPENDENT_AMBULATORY_CARE_PROVIDER_SITE_OTHER): Payer: BC Managed Care – PPO | Admitting: Clinical

## 2019-06-07 DIAGNOSIS — F332 Major depressive disorder, recurrent severe without psychotic features: Secondary | ICD-10-CM | POA: Diagnosis not present

## 2019-06-13 ENCOUNTER — Other Ambulatory Visit: Payer: Self-pay

## 2019-06-13 ENCOUNTER — Encounter: Payer: Self-pay | Admitting: Family Medicine

## 2019-06-13 ENCOUNTER — Ambulatory Visit: Payer: BC Managed Care – PPO | Admitting: Family Medicine

## 2019-06-13 VITALS — BP 120/80 | HR 69 | Temp 97.2°F | Ht 67.0 in | Wt 281.8 lb

## 2019-06-13 DIAGNOSIS — M7581 Other shoulder lesions, right shoulder: Secondary | ICD-10-CM | POA: Diagnosis not present

## 2019-06-13 DIAGNOSIS — M7501 Adhesive capsulitis of right shoulder: Secondary | ICD-10-CM | POA: Diagnosis not present

## 2019-06-13 NOTE — Progress Notes (Signed)
Kristie Whiters T. Madalina Rosman, MD Primary Care and Sports Medicine [facility] [address] [place], [address] Phone: [phone]  FAX: [phone]  Kristie Hendricks - 34 y.o. female  MRN [id number]  Date of Birth: [birth year]  Visit Date: 06/13/2019  PCP: Doreene Nest, NP  Referred by: Doreene Nest, NP  Chief Complaint  Patient presents with  . Follow-up    Right Shoulder    This visit occurred during the SARS-CoV-2 public health emergency.  Safety protocols were in place, including screening questions prior to the visit, additional usage of staff PPE, and extensive cleaning of exam room while observing appropriate contact time as indicated for disinfecting solutions.   Subjective:   Kristie Hendricks is a 34 y.o. very pleasant female patient with Body mass index is 44.13 kg/m. who presents with the following:  She continues to have some pain and loss of motion in her right shoulder.  This is quite a bit better compared to prior office visits.  I did do an injection for her shoulder previously and this only gave her about 3 weeks of symptomatic relief.  She has done some physical therapy and is doing some home exercise program with some regularity.  She is frustrated by her continued pain.  She does think that her range of motion has improved some.  The radiological images were independently reviewed by myself in the office and results were reviewed with the patient. My independent interpretation of images: I spent about 15 minutes personally reviewing the patient's MR arthrogram and reviewed this with her face-to-face.  There is no capsular distention on arthrogram.  There does not appear to be any AC arthropathy.  There is some T2 signal uptake at the supraspinatus, consistent with supraspinatus tendinopathy.  I do not appreciate any other tendinopathy.  There appears to be no discrete tears.  There is certainly no full-thickness rotator cuff tear.  Electronically Signed  By: Hannah Beat, MD On: 06/13/2019 11:20 AM EST  01/26/2019 Last OV with Hannah Beat, MD  F/u R shoulder, loss of motion, clinically frozen shoulder.  4 months.  She has been making some progress, and I did an intra-articular injection on her last office visit and this seemed to provide some pain and increased her range of motion.  She is still struggling with some loss of motion, but is improved.  She has had some breakthrough with pain in her arc of motion.  She still having a tissue distribution of pain.   chiropractor   09/29/2018 Pleasant patient of Mrs. Chestine Spore who asked me to see the patient today.  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  BMI is 47.  She presents with a multi-month history of pain with abduction and to a lesser extent with internal range of motion at the shoulder.  She also has a T-shirt distribution of pain.  No prior traumatic shoulder injury, operative intervention or fracture.   She also has some shoulder blade pain as well as some tingling and numbness from the elbow down.  Primary numbness is at the dorsum of the third and fourth digit.  She is not having any strength changes.  She is having some occasional tingling as well.  This is been only going on for about 1 to 2 weeks, and she is not had anything similar in the past.  She is not really tried any significant interventions.  Upper arm pain.  Has had some numbness.   3 and 4 numbness   Abd pain. IROM some.   Fell in Feb, but hurting before that. 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   GEN: No fevers, chills. Nontoxic. Primarily MSK c/o today. MSK: Detailed in the HPI GI: tolerating PO intake without  difficulty Neuro: No numbness, parasthesias, or tingling associated. Otherwise the pertinent positives of the ROS are noted above.   Objective:   BP 120/80   Pulse 69   Temp (!) 97.2 F (36.2 C) (Temporal)   Ht 5\' 7"  (1.702 m)   Wt 281 lb 12 oz (127.8 kg)   LMP 06/07/2019   SpO2 99%   BMI 44.13 kg/m    GEN: WDWN, NAD, Non-toxic, Alert & Oriented x 3 HEENT: Atraumatic, Normocephalic.  Ears and Nose: No external deformity. EXTR: No clubbing/cyanosis/edema NEURO: Normal gait.  PSYCH: Normally interactive. Conversant. Not depressed or anxious appearing.  Calm demeanor.   Shoulder: R and L Inspection: No muscle wasting or winging Ecchymosis/edema: neg  AC joint, scapula, clavicle: NT Cervical spine: NT, full ROM Spurling's: neg ABNORMAL SIDE TESTED: R UNLESS OTHERWISE NOTED, THE CONTRALATERAL SIDE HAS FULL RANGE OF MOTION. Abduction: 5/5, LIMITED TO 170 DEGREES Flexion: 5/5, LIMITED TO 170 DEGNO ROM  IR, lift-off: 5/5. TESTED AT 90 DEGREES OF ABDUCTION, LIMITED TO 30 DEGREES ER at neutral:  5/5, TESTED AT 90 DEGREES OF ABDUCTION, LIMITED TO 65 DEGREES AC crossover and compression: PAIN Drop Test: neg Empty Can: neg Supraspinatus insertion: NT Bicipital groove: NT ALL OTHER SPECIAL TESTING EQUIVOCAL GIVEN LOSS OF MOTION C5-T1 intact Sensation intact Grip 5/5   08/05/2019 and Neer are positive  Radiology: No results found.  Assessment and Plan:     ICD-10-CM   1. Adhesive capsulitis of right shoulder  M75.01   2. Rotator cuff tendinitis, right  M75.81    Total encounter time: 30-39 minutes. On the day of the patient encounter, this can include review of prior records, labs, and imaging.  Additional time can include counselling, consultation with peer MD in person or by telephone.  This also includes independent review of Radiology.  Her MR arthrogram is very reassuring.  She is frustrated with her recovery, but her range of motion is drastically improved.  I  suspect that she will enter the following phase soon if she is not already there.  She does have some rotator cuff tendinopathy as well.  She is doing some physical therapy.  She is also doing home rehab.  I would do nothing in addition to this for now.  She also agrees with this.  Globally, her shoulder appears to be doing better with some improved range of motion and decreased pain overall.  Follow-up: Return in about 3 months (around 09/11/2019).  No orders of the defined types were placed in this encounter.  No orders of the defined types were placed in this encounter.   Signed,  09/13/2019. Jamear Carbonneau, MD   Outpatient Encounter Medications as of 06/13/2019  Medication Sig  . ALPRAZolam (XANAX) 0.5 MG tablet SMARTSIG:1 Tablet(s) By Mouth As Needed  . fexofenadine (ALLEGRA) 180 MG tablet Take 180 mg by mouth daily  as needed.   Marland Kitchen FLUoxetine (PROZAC) 10 MG capsule   . lurasidone (LATUDA) 40 MG TABS tablet Take 40 mg by mouth daily with breakfast.  . penicillin v potassium (VEETID) 500 MG tablet Take 1 tablet (500 mg total) by mouth 4 (four) times daily.  . traZODone (DESYREL) 50 MG tablet Take 0.5-1 tablets (25-50 mg total) by mouth at bedtime as needed for sleep.  . [DISCONTINUED] clonazePAM (KLONOPIN) 0.5 MG tablet Take 0.5-1 tablets (0.25-0.5 mg total) by mouth as directed. Only for severe anxiety attacks up to 2-3 times a week.  . [DISCONTINUED] gabapentin (NEURONTIN) 100 MG capsule Take 1 capsule (100 mg total) by mouth 3 (three) times daily. Titrate up as directed   No facility-administered encounter medications on file as of 06/13/2019.

## 2019-06-14 ENCOUNTER — Encounter: Payer: Self-pay | Admitting: Family Medicine

## 2019-06-14 ENCOUNTER — Ambulatory Visit (INDEPENDENT_AMBULATORY_CARE_PROVIDER_SITE_OTHER): Payer: BC Managed Care – PPO | Admitting: Clinical

## 2019-06-14 DIAGNOSIS — F332 Major depressive disorder, recurrent severe without psychotic features: Secondary | ICD-10-CM

## 2019-06-21 ENCOUNTER — Ambulatory Visit (INDEPENDENT_AMBULATORY_CARE_PROVIDER_SITE_OTHER): Payer: BC Managed Care – PPO | Admitting: Clinical

## 2019-06-21 DIAGNOSIS — F3181 Bipolar II disorder: Secondary | ICD-10-CM | POA: Diagnosis not present

## 2019-06-21 DIAGNOSIS — F332 Major depressive disorder, recurrent severe without psychotic features: Secondary | ICD-10-CM

## 2019-06-27 ENCOUNTER — Ambulatory Visit (INDEPENDENT_AMBULATORY_CARE_PROVIDER_SITE_OTHER): Payer: BC Managed Care – PPO | Admitting: Clinical

## 2019-06-27 DIAGNOSIS — F332 Major depressive disorder, recurrent severe without psychotic features: Secondary | ICD-10-CM

## 2019-06-28 ENCOUNTER — Ambulatory Visit: Payer: BC Managed Care – PPO | Admitting: Clinical

## 2019-07-05 ENCOUNTER — Ambulatory Visit (INDEPENDENT_AMBULATORY_CARE_PROVIDER_SITE_OTHER): Payer: BC Managed Care – PPO | Admitting: Clinical

## 2019-07-05 DIAGNOSIS — F332 Major depressive disorder, recurrent severe without psychotic features: Secondary | ICD-10-CM | POA: Diagnosis not present

## 2019-07-12 ENCOUNTER — Ambulatory Visit (INDEPENDENT_AMBULATORY_CARE_PROVIDER_SITE_OTHER): Payer: BC Managed Care – PPO | Admitting: Clinical

## 2019-07-12 DIAGNOSIS — F332 Major depressive disorder, recurrent severe without psychotic features: Secondary | ICD-10-CM

## 2019-07-18 DIAGNOSIS — F3181 Bipolar II disorder: Secondary | ICD-10-CM | POA: Diagnosis not present

## 2019-07-19 ENCOUNTER — Ambulatory Visit (INDEPENDENT_AMBULATORY_CARE_PROVIDER_SITE_OTHER): Payer: BC Managed Care – PPO | Admitting: Clinical

## 2019-07-19 DIAGNOSIS — F332 Major depressive disorder, recurrent severe without psychotic features: Secondary | ICD-10-CM

## 2019-07-26 ENCOUNTER — Ambulatory Visit (INDEPENDENT_AMBULATORY_CARE_PROVIDER_SITE_OTHER): Payer: BC Managed Care – PPO | Admitting: Clinical

## 2019-07-26 DIAGNOSIS — F332 Major depressive disorder, recurrent severe without psychotic features: Secondary | ICD-10-CM | POA: Diagnosis not present

## 2019-08-01 IMAGING — US US PELVIS LIMITED
1 series · 14 of 17 positions shown · non-contrast
Comparison: CT abdomen and pelvis June 01, 2018

CLINICAL DATA: Left inguinal region pain

EXAM:
LIMITED ULTRASOUND OF PELVIS/LEFT INGUINAL REGION
TECHNIQUE: Limited transabdominal ultrasound examination of the pelvis was
performed.

[Series 1: us pelvis limited · 0.11mm/px · 17 acquisitions, 14 frames shown]
[im 1/17]
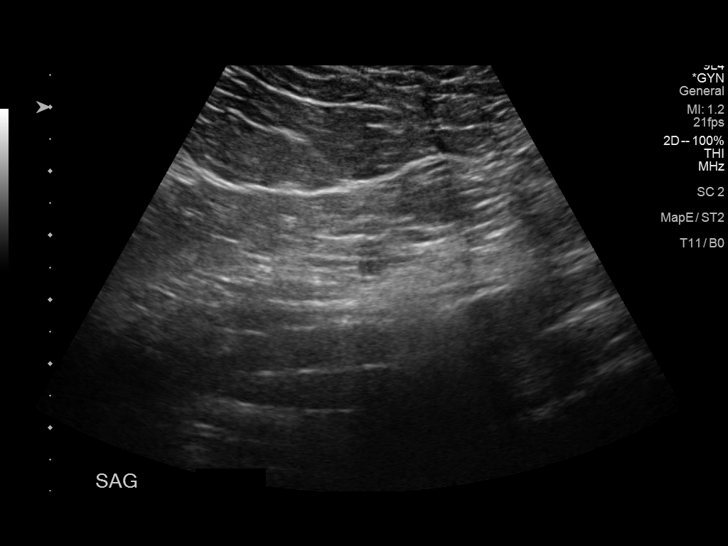
[im 2/17]
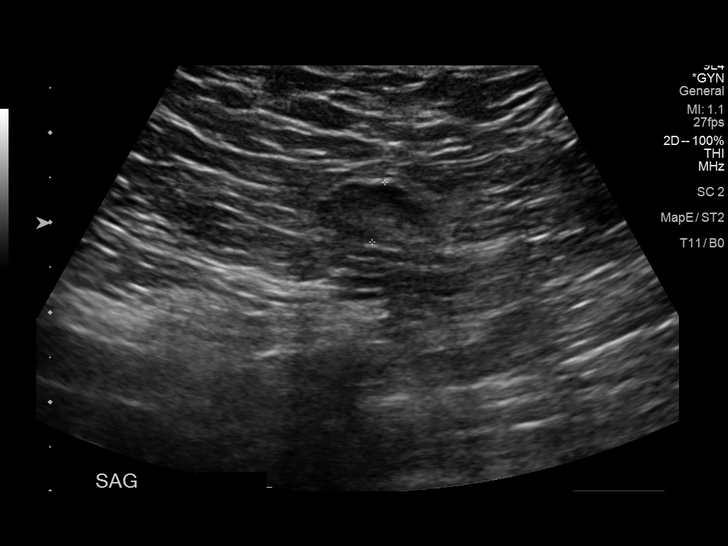
[im 4/17]
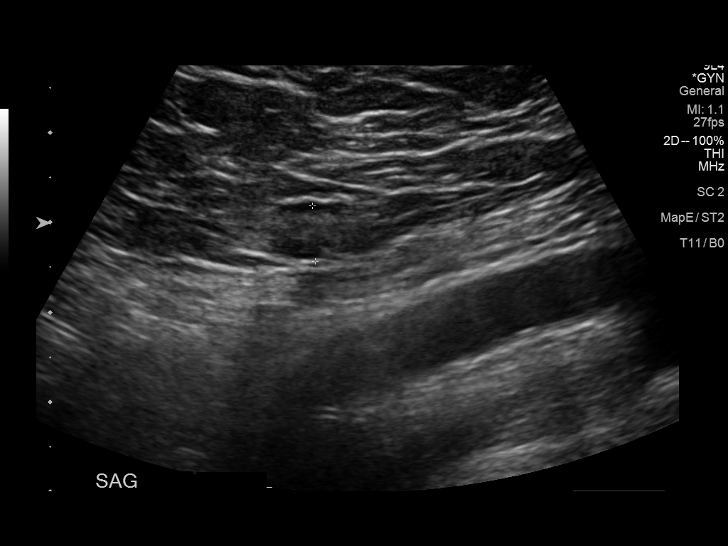
[im 5/17]
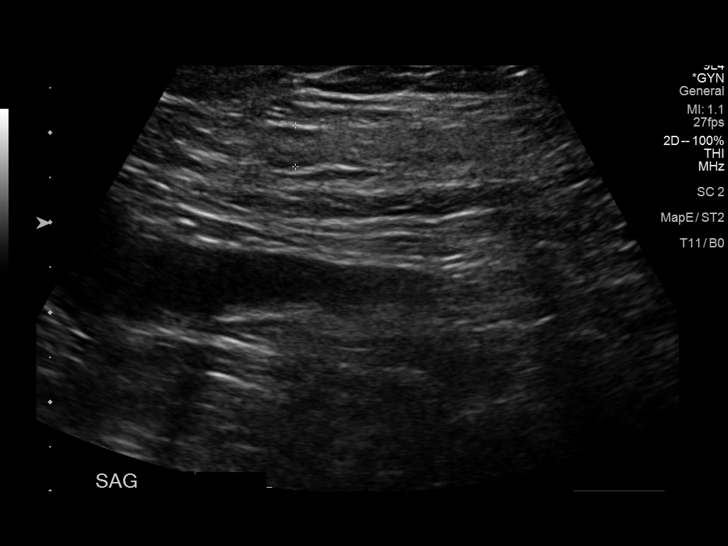
[im 6/17]
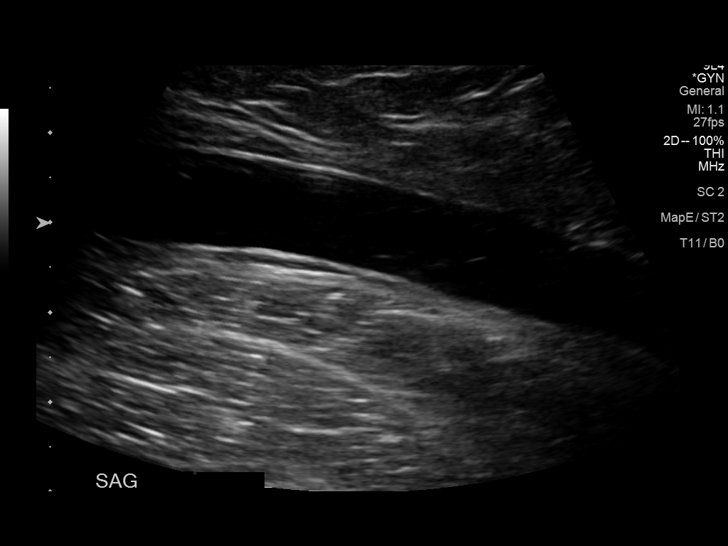
[im 7/17]
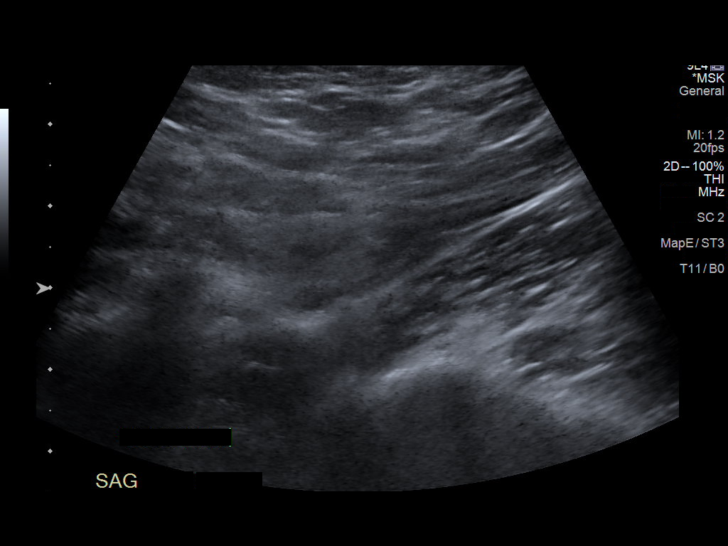
[im 8/17]
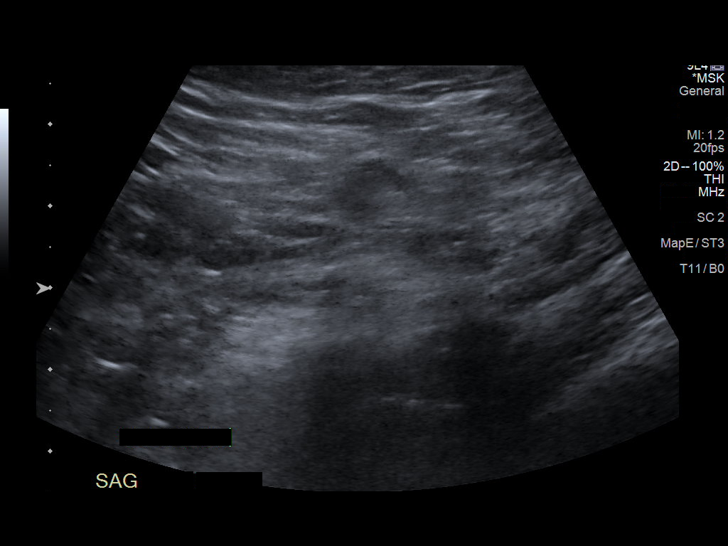
[im 10/17]
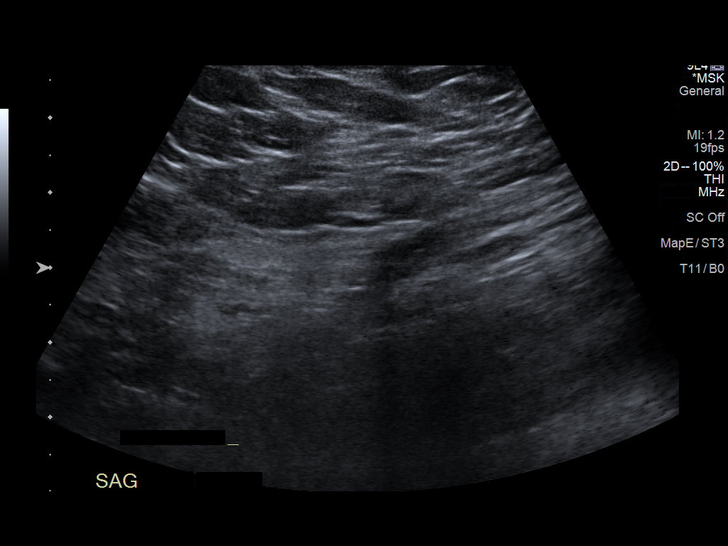
[im 11/17]
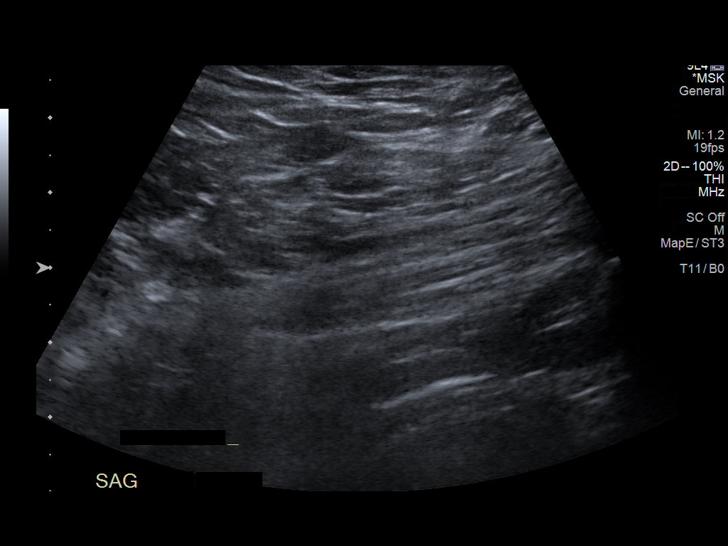
[im 12/17]
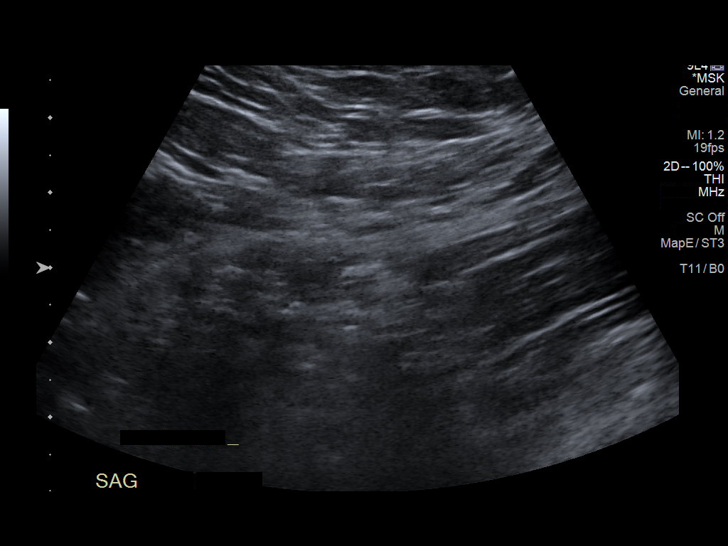
[im 13/17]
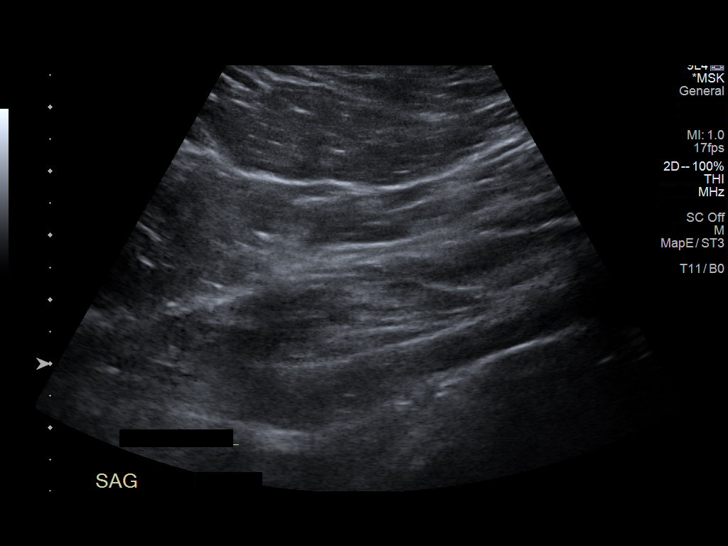
[im 14/17]
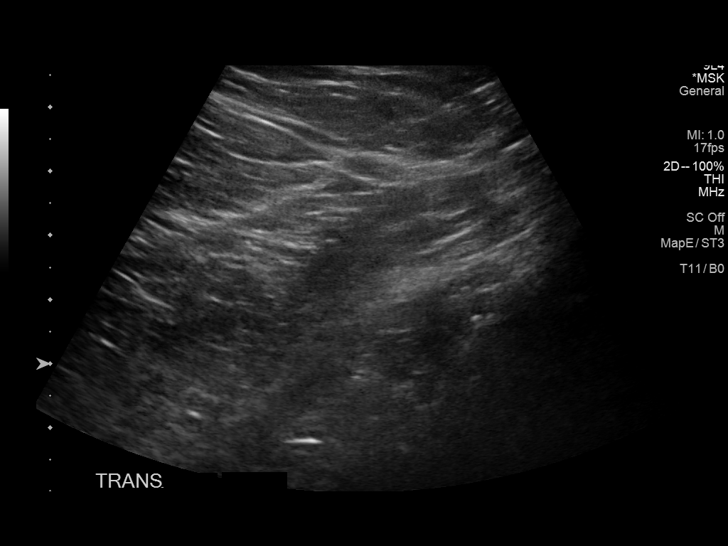
[im 16/17]
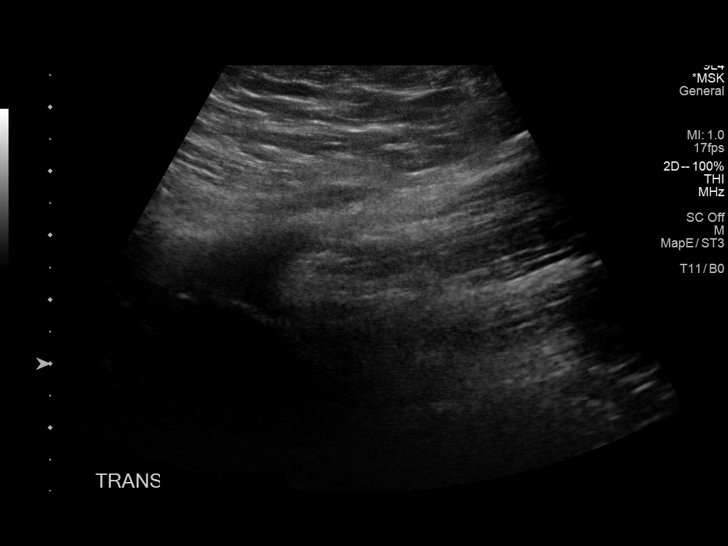
[im 17/17]
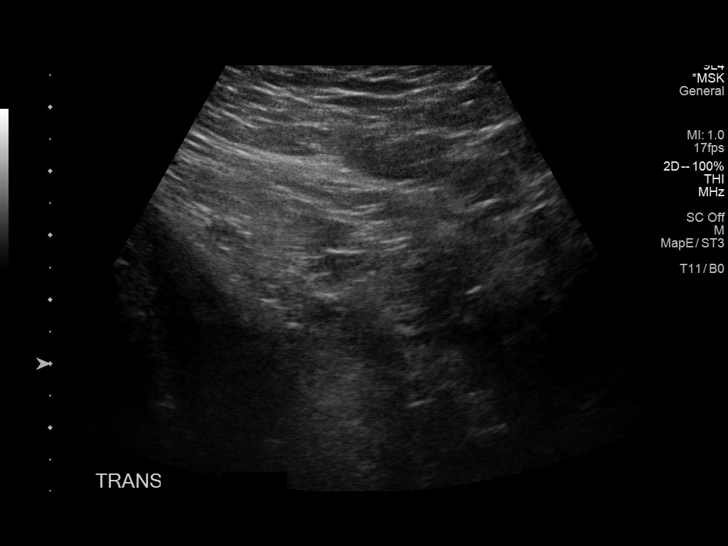

[14 of 17 positions shown; findings below may reference images not displayed]

FINDINGS: Longitudinal and transverse images were obtained in the left
inguinal region both with and without Valsalva maneuvers. There is
no demonstrable hernia. There is no inflammatory focus or abnormal
fluid collection. No mass evident. There are benign-appearing
subcentimeter inguinal lymph nodes. No adenopathy evident in the
left inguinal region.
IMPRESSION: Occasional benign-appearing left inguinal lymph nodes. No frank
adenopathy. No evident mass or hernia. No inflammatory focus.

## 2019-08-02 ENCOUNTER — Ambulatory Visit (INDEPENDENT_AMBULATORY_CARE_PROVIDER_SITE_OTHER): Payer: BC Managed Care – PPO | Admitting: Clinical

## 2019-08-02 DIAGNOSIS — F3171 Bipolar disorder, in partial remission, most recent episode hypomanic: Secondary | ICD-10-CM | POA: Diagnosis not present

## 2019-08-09 ENCOUNTER — Ambulatory Visit (INDEPENDENT_AMBULATORY_CARE_PROVIDER_SITE_OTHER): Payer: BC Managed Care – PPO | Admitting: Clinical

## 2019-08-09 DIAGNOSIS — F332 Major depressive disorder, recurrent severe without psychotic features: Secondary | ICD-10-CM

## 2019-08-16 ENCOUNTER — Ambulatory Visit: Payer: BC Managed Care – PPO | Admitting: Clinical

## 2019-08-23 ENCOUNTER — Ambulatory Visit (INDEPENDENT_AMBULATORY_CARE_PROVIDER_SITE_OTHER): Payer: BC Managed Care – PPO | Admitting: Clinical

## 2019-08-23 DIAGNOSIS — F332 Major depressive disorder, recurrent severe without psychotic features: Secondary | ICD-10-CM | POA: Diagnosis not present

## 2019-08-30 ENCOUNTER — Ambulatory Visit (INDEPENDENT_AMBULATORY_CARE_PROVIDER_SITE_OTHER): Payer: BC Managed Care – PPO | Admitting: Clinical

## 2019-08-30 DIAGNOSIS — F332 Major depressive disorder, recurrent severe without psychotic features: Secondary | ICD-10-CM

## 2019-09-06 ENCOUNTER — Ambulatory Visit (INDEPENDENT_AMBULATORY_CARE_PROVIDER_SITE_OTHER): Payer: BC Managed Care – PPO | Admitting: Clinical

## 2019-09-06 DIAGNOSIS — F3171 Bipolar disorder, in partial remission, most recent episode hypomanic: Secondary | ICD-10-CM

## 2019-09-13 ENCOUNTER — Ambulatory Visit: Payer: BC Managed Care – PPO | Admitting: Clinical

## 2019-09-20 ENCOUNTER — Ambulatory Visit: Payer: BC Managed Care – PPO | Admitting: Clinical

## 2019-09-27 ENCOUNTER — Ambulatory Visit (INDEPENDENT_AMBULATORY_CARE_PROVIDER_SITE_OTHER): Payer: BC Managed Care – PPO | Admitting: Clinical

## 2019-09-27 DIAGNOSIS — F332 Major depressive disorder, recurrent severe without psychotic features: Secondary | ICD-10-CM | POA: Diagnosis not present

## 2019-09-28 ENCOUNTER — Ambulatory Visit: Payer: BC Managed Care – PPO | Admitting: Family Medicine

## 2019-10-04 ENCOUNTER — Ambulatory Visit: Payer: BC Managed Care – PPO | Admitting: Clinical

## 2019-10-11 ENCOUNTER — Ambulatory Visit (INDEPENDENT_AMBULATORY_CARE_PROVIDER_SITE_OTHER): Payer: BC Managed Care – PPO | Admitting: Clinical

## 2019-10-11 DIAGNOSIS — F332 Major depressive disorder, recurrent severe without psychotic features: Secondary | ICD-10-CM | POA: Diagnosis not present

## 2019-10-12 ENCOUNTER — Ambulatory Visit: Payer: BC Managed Care – PPO | Admitting: Family Medicine

## 2019-10-18 ENCOUNTER — Ambulatory Visit (INDEPENDENT_AMBULATORY_CARE_PROVIDER_SITE_OTHER): Payer: BC Managed Care – PPO | Admitting: Clinical

## 2019-10-18 DIAGNOSIS — F332 Major depressive disorder, recurrent severe without psychotic features: Secondary | ICD-10-CM

## 2019-10-25 ENCOUNTER — Ambulatory Visit (INDEPENDENT_AMBULATORY_CARE_PROVIDER_SITE_OTHER): Payer: BC Managed Care – PPO | Admitting: Clinical

## 2019-10-25 DIAGNOSIS — F332 Major depressive disorder, recurrent severe without psychotic features: Secondary | ICD-10-CM | POA: Diagnosis not present

## 2019-10-26 ENCOUNTER — Ambulatory Visit: Payer: BC Managed Care – PPO | Admitting: Family Medicine

## 2019-10-26 ENCOUNTER — Encounter: Payer: Self-pay | Admitting: Family Medicine

## 2019-10-26 ENCOUNTER — Other Ambulatory Visit: Payer: Self-pay

## 2019-10-26 VITALS — BP 139/81 | HR 81

## 2019-10-26 DIAGNOSIS — Z3202 Encounter for pregnancy test, result negative: Secondary | ICD-10-CM

## 2019-10-26 DIAGNOSIS — Z30430 Encounter for insertion of intrauterine contraceptive device: Secondary | ICD-10-CM

## 2019-10-26 LAB — POCT URINE PREGNANCY: Preg Test, Ur: NEGATIVE

## 2019-10-26 MED ORDER — PARAGARD INTRAUTERINE COPPER IU IUD: INTRAUTERINE_SYSTEM | Freq: Once | INTRAUTERINE | Status: AC

## 2019-10-26 NOTE — Progress Notes (Signed)
   Contraception/Family Planning VISIT ENCOUNTER NOTE  Subjective:   Barbarita Hutmacher is a 34 y.o. G0P0000 female here for reproductive life counseling.  Desires effectiveness from [facility].  Reports she does not want a pregnancy in the next year. Denies abnormal vaginal bleeding, discharge, pelvic pain, problems with intercourse or other gynecologic concerns.    Gynecologic History No LMP recorded. Contraception: condoms  Health Maintenance Due  Topic Date Due  . COVID-19 Vaccine (1) Never done   The following portions of the patient's history were reviewed and updated as appropriate: allergies, current medications, past family history, past medical history, past social history, past surgical history and problem list.  Review of Systems Pertinent items are noted in HPI.   Objective:  BP 139/81   Pulse 81  Gen: well appearing, NAD HEENT: no scleral icterus CV: RR Lung: Normal WOB Ext: warm well perfused  PELVIC: Normal appearing external genitalia; normal appearing vaginal mucosa and cervix.  No abnormal discharge noted.    Normal uterine size, no other palpable masses, no uterine or adnexal tenderness.   IUD Insertion Procedure Note Patient identified, informed consent performed, consent signed.   Discussed risks of irregular bleeding, cramping, infection, malpositioning or misplacement of the IUD outside the uterus which may require further procedure such as laparoscopy. Time out was performed.    Speculum placed in the vagina.  Cervix visualized.  Cleaned with Betadine x 2.  Grasped anteriorly with a single tooth tenaculum.  Uterus sounded to 9 cm.  IUD placed per manufacturer's recommendations.  Strings trimmed to 3 cm. Tenaculum was removed, good hemostasis noted.  Patient tolerated procedure well. Confirmed fundal placement with bedside US which showed clear IUD arms at the fundus.    Assessment and Plan:   Contraception counseling: Reviewed all forms of birth control options in the  tiered based approach. available including abstinence; over the counter/barrier methods; hormonal contraceptive medication including pill, patch, ring, injection,contraceptive implant, ECP; hormonal and nonhormonal IUDs; permanent sterilization options including vasectomy and the various tubal sterilization modalities. Risks, benefits, and typical effectiveness rates were reviewed.  Questions were answered.  Written information was also given to the patient to review.  Patient desires cu-IUD, this was prescribed for patient. She will follow up in  3-4wk for surveillance.  She was told to call with any further questions, or with any concerns about this method of contraception.  Emphasized use of condoms 100% of the time for STI prevention.  1. Encounter for insertion of copper IUD - Placed easily with [place] confirmation of fundal placement.  - POCT urine pregnancy  -Patient was given post-procedure instructions- both agency handout and verbally by provider.  She was advised to have backup contraception for one week.  Patient was also asked to check IUD strings periodically or follow up in 4 weeks for IUD check.  Please refer to After Visit Summary for other counseling recommendations.   Return in about 4 weeks (around 11/23/2019) for IUD string check, [place] to assure uterine placement.  Federico Flake, MD

## 2019-10-26 NOTE — Progress Notes (Signed)
Pt here to get paragard IUD, last sex was 2 weeks ago but used protection, last unprotected sex was months ago

## 2019-10-26 NOTE — Progress Notes (Signed)
Last pap 02/16/2019- normal

## 2019-11-01 ENCOUNTER — Ambulatory Visit: Payer: BC Managed Care – PPO | Admitting: Internal Medicine

## 2019-11-01 ENCOUNTER — Ambulatory Visit (INDEPENDENT_AMBULATORY_CARE_PROVIDER_SITE_OTHER): Payer: BC Managed Care – PPO | Admitting: Clinical

## 2019-11-01 DIAGNOSIS — F332 Major depressive disorder, recurrent severe without psychotic features: Secondary | ICD-10-CM

## 2019-11-01 DIAGNOSIS — F3181 Bipolar II disorder: Secondary | ICD-10-CM | POA: Diagnosis not present

## 2019-11-08 ENCOUNTER — Ambulatory Visit: Payer: BC Managed Care – PPO | Admitting: Internal Medicine

## 2019-11-08 ENCOUNTER — Ambulatory Visit (INDEPENDENT_AMBULATORY_CARE_PROVIDER_SITE_OTHER): Payer: BC Managed Care – PPO | Admitting: Clinical

## 2019-11-08 DIAGNOSIS — F332 Major depressive disorder, recurrent severe without psychotic features: Secondary | ICD-10-CM

## 2019-11-15 ENCOUNTER — Ambulatory Visit (INDEPENDENT_AMBULATORY_CARE_PROVIDER_SITE_OTHER): Payer: BC Managed Care – PPO | Admitting: Clinical

## 2019-11-15 DIAGNOSIS — F332 Major depressive disorder, recurrent severe without psychotic features: Secondary | ICD-10-CM | POA: Diagnosis not present

## 2019-11-22 ENCOUNTER — Ambulatory Visit: Payer: BC Managed Care – PPO | Admitting: Clinical

## 2019-11-29 ENCOUNTER — Ambulatory Visit (INDEPENDENT_AMBULATORY_CARE_PROVIDER_SITE_OTHER): Payer: BC Managed Care – PPO | Admitting: Clinical

## 2019-11-29 DIAGNOSIS — F332 Major depressive disorder, recurrent severe without psychotic features: Secondary | ICD-10-CM | POA: Diagnosis not present

## 2019-11-29 DIAGNOSIS — F3181 Bipolar II disorder: Secondary | ICD-10-CM | POA: Diagnosis not present

## 2019-11-30 ENCOUNTER — Other Ambulatory Visit (HOSPITAL_COMMUNITY)
Admission: RE | Admit: 2019-11-30 | Discharge: 2019-11-30 | Disposition: A | Discharge status: Home / Self Care | Payer: BC Managed Care – PPO | Source: Ambulatory Visit | Attending: Advanced Practice Midwife | Admitting: Advanced Practice Midwife

## 2019-11-30 ENCOUNTER — Encounter: Payer: Self-pay | Admitting: Advanced Practice Midwife

## 2019-11-30 ENCOUNTER — Ambulatory Visit (INDEPENDENT_AMBULATORY_CARE_PROVIDER_SITE_OTHER): Payer: BC Managed Care – PPO | Admitting: Advanced Practice Midwife

## 2019-11-30 ENCOUNTER — Other Ambulatory Visit: Payer: Self-pay

## 2019-11-30 VITALS — BP 144/71 | HR 78

## 2019-11-30 DIAGNOSIS — Z30431 Encounter for routine checking of intrauterine contraceptive device: Secondary | ICD-10-CM

## 2019-11-30 DIAGNOSIS — N898 Other specified noninflammatory disorders of vagina: Secondary | ICD-10-CM | POA: Diagnosis not present

## 2019-11-30 DIAGNOSIS — I10 Essential (primary) hypertension: Secondary | ICD-10-CM

## 2019-11-30 MED ORDER — METRONIDAZOLE 500 MG PO TABS: 500.0000 mg | ORAL_TABLET | Freq: Two times a day (BID) | ORAL | 0 refills | Status: AC

## 2019-11-30 NOTE — Progress Notes (Signed)
° ° °  GYNECOLOGY OFFICE ENCOUNTER NOTE  History:  34 y.o. G0P0000 here today for today for IUD string check; Paragard  IUD was placed 10/26/2019. Patient reports occasional abdominal cramping, no severe pain, no concerning side effects.  The following portions of the patient's history were reviewed and updated as appropriate: allergies, current medications, past family history, past medical history, past social history, past surgical history and problem list. Last pap smear on 02/16/2019 was normal, negative HRHPV.  Review of Systems:  Pertinent items are noted in HPI.   Objective:  Physical Exam Blood pressure (!) 144/71, pulse 78. CONSTITUTIONAL: Well-developed, well-nourished female in no acute distress.  HENT:  Normocephalic, atraumatic. External right and left ear normal. Oropharynx is clear and moist EYES: Conjunctivae and EOM are normal. Pupils are equal, round, and reactive to light. No scleral icterus.  NECK: Normal range of motion, supple, no masses CARDIOVASCULAR: Normal heart rate noted RESPIRATORY: Effort and breath sounds normal, no problems with respiration noted ABDOMEN: Soft, no distention noted.   PELVIC: Normal appearing external genitalia; normal appearing vaginal mucosa and cervix.  IUD strings visualized, about 3 cm in length outside cervix.   Assessment & Plan:  --Patient to keep IUD in place for up to twelve years; can come in for removal if she desires pregnancy earlier or for any concerning side effects. --Confirmatory ultrasound ordered due to patient request and history of malpositioned IUD --Elevated BP x 2 today in clinic. Advised to contact PCP to discuss ongoing surveillence   Clayton Bibles, MSN, CNM Certified Nurse Midwife, [employer] for [employer], [facility] 11/30/19 3:46 PM

## 2019-11-30 NOTE — Patient Instructions (Signed)
Preventive Care 21-33 Years Old, Female Preventive care refers to visits with your health care provider and lifestyle choices that can promote health and wellness. This includes:  A yearly physical exam. This may also be called an annual well check.  Regular dental visits and eye exams.  Immunizations.  Screening for certain conditions.  Healthy lifestyle choices, such as eating a healthy diet, getting regular exercise, not using drugs or products that contain nicotine and tobacco, and limiting alcohol use. What can I expect for my preventive care visit? Physical exam Your health care provider will check your:  Height and weight. This may be used to calculate body mass index (BMI), which tells if you are at a healthy weight.  Heart rate and blood pressure.  Skin for abnormal spots. Counseling Your health care provider may ask you questions about your:  Alcohol, tobacco, and drug use.  Emotional well-being.  Home and relationship well-being.  Sexual activity.  Eating habits.  Work and work environment.  Method of birth control.  Menstrual cycle.  Pregnancy history. What immunizations do I need?  Influenza (flu) vaccine  This is recommended every year. Tetanus, diphtheria, and pertussis (Tdap) vaccine  You may need a Td booster every 10 years. Varicella (chickenpox) vaccine  You may need this if you have not been vaccinated. Human papillomavirus (HPV) vaccine  If recommended by your health care provider, you may need three doses over 6 months. Measles, mumps, and rubella (MMR) vaccine  You may need at least one dose of MMR. You may also need a second dose. Meningococcal conjugate (MenACWY) vaccine  One dose is recommended if you are age 19-21 years and a first-year college student living in a residence hall, or if you have one of several medical conditions. You may also need additional booster doses. Pneumococcal conjugate (PCV13) vaccine  You may need  this if you have certain conditions and were not previously vaccinated. Pneumococcal polysaccharide (PPSV23) vaccine  You may need one or two doses if you smoke cigarettes or if you have certain conditions. Hepatitis A vaccine  You may need this if you have certain conditions or if you travel or work in places where you may be exposed to hepatitis A. Hepatitis B vaccine  You may need this if you have certain conditions or if you travel or work in places where you may be exposed to hepatitis B. Haemophilus influenzae type b (Hib) vaccine  You may need this if you have certain conditions. You may receive vaccines as individual doses or as more than one vaccine together in one shot (combination vaccines). Talk with your health care provider about the risks and benefits of combination vaccines. What tests do I need?  Blood tests  Lipid and cholesterol levels. These may be checked every 5 years starting at age 20.  Hepatitis C test.  Hepatitis B test. Screening  Diabetes screening. This is done by checking your blood sugar (glucose) after you have not eaten for a while (fasting).  Sexually transmitted disease (STD) testing.  BRCA-related cancer screening. This may be done if you have a family history of breast, ovarian, tubal, or peritoneal cancers.  Pelvic exam and Pap test. This may be done every 3 years starting at age 21. Starting at age 30, this may be done every 5 years if you have a Pap test in combination with an HPV test. Talk with your health care provider about your test results, treatment options, and if necessary, the need for more tests.   Follow these instructions at home: Eating and drinking   Eat a diet that includes fresh fruits and vegetables, whole grains, lean protein, and low-fat dairy.  Take vitamin and mineral supplements as recommended by your health care provider.  Do not drink alcohol if: ? Your health care provider tells you not to drink. ? You are  pregnant, may be pregnant, or are planning to become pregnant.  If you drink alcohol: ? Limit how much you have to 0-1 drink a day. ? Be aware of how much alcohol is in your drink. In the [place] one drink equals one 12 oz bottle of beer (355 mL), one 5 oz glass of wine (148 mL), or one 1 oz glass of hard liquor (44 mL). Lifestyle  Take daily care of your teeth and gums.  Stay active. Exercise for at least 30 minutes on 5 or more days each week.  Do not use any products that contain nicotine or tobacco, such as cigarettes, e-cigarettes, and chewing tobacco. If you need help quitting, ask your health care provider.  If you are sexually active, practice safe sex. Use a condom or other form of birth control (contraception) in order to prevent pregnancy and STIs (sexually transmitted infections). If you plan to become pregnant, see your health care provider for a preconception visit. What's next?  Visit your health care provider once a year for a well check visit.  Ask your health care provider how often you should have your eyes and teeth checked.  Stay up to date on all vaccines. This information is not intended to replace advice given to you by your health care provider. Make sure you discuss any questions you have with your health care provider. Document Revised: 01/21/2018 Document Reviewed: 01/21/2018 Elsevier Patient Education  2020 [employer].

## 2019-12-01 ENCOUNTER — Encounter: Payer: Self-pay | Admitting: Primary Care

## 2019-12-01 ENCOUNTER — Ambulatory Visit: Payer: BC Managed Care – PPO | Admitting: Primary Care

## 2019-12-01 DIAGNOSIS — I10 Essential (primary) hypertension: Secondary | ICD-10-CM | POA: Diagnosis not present

## 2019-12-01 MED ORDER — HYDROCHLOROTHIAZIDE 12.5 MG PO TABS: 12.5000 mg | ORAL_TABLET | Freq: Every day | ORAL | 0 refills | Status: DC

## 2019-12-01 NOTE — Progress Notes (Signed)
Subjective:    Patient ID: Kristie Hendricks, female    DOB: 1986/05/20, 34 y.o.   MRN: [id number]  HPI  This visit occurred during the SARS-CoV-2 public health emergency.  Safety protocols were in place, including screening questions prior to the visit, additional usage of staff PPE, and extensive cleaning of exam room while observing appropriate contact time as indicated for disinfecting solutions.   Kristie Hendricks is a 34 year old female with a history of hypertension, allergic rhinitis, morbid obesity, history of gastric bypass, recurrent cellulitis of lower extremity, Bipolar disorder who presents today with a chief complaint of elevated blood pressure.  She's noticed that her blood pressure has been elevated over the last 2-3 months with her GYN. She's also noticed right sided temporal headaches intermittently. Headaches occur with walking upstairs and exercising. She denies dizziness, chest pain. She's been under a lot of stress with her boyfriends illness and now her father with cancer.   She is not checking her BP at home. She was once treated for hypertension in the past, but has not been on treatment since gastric bypass surgery. She endorses stress eating, weight gain of "30 pounds" over the last year.   Wt Readings from Last 3 Encounters:  12/01/19 299 lb 8 oz (135.9 kg)  06/13/19 281 lb 12 oz (127.8 kg)  04/20/19 278 lb (126.1 kg)    BP Readings from Last 3 Encounters:  12/01/19 (!) 146/82  11/30/19 (!) 144/71  10/26/19 139/81     Review of Systems  Eyes: Negative for visual disturbance.  Respiratory: Negative for shortness of breath.   Cardiovascular: Negative for chest pain.  Neurological: Positive for headaches.  Psychiatric/Behavioral: The patient is nervous/anxious.        Past Medical History:  Diagnosis Date  . Anxiety   . Asthma   . Bipolar disorder (HCC)   . Chickenpox   . Depression   . Frequent headaches   . Hypertension   . Migraines   . Pancreatitis       Social History   Socioeconomic History  . Marital status: Significant Other    Spouse name: Not on file  . Number of children: 0  . Years of education: Not on file  . Highest education level: Associate degree: occupational, Scientist, product/process development, or vocational program  Occupational History  . Not on file  Tobacco Use  . Smoking status: Never Smoker  . Smokeless tobacco: Never Used  Vaping Use  . Vaping Use: Never used  Substance and Sexual Activity  . Alcohol use: Yes    Alcohol/week: 1.0 standard drink    Types: 1 Glasses of wine per week    Comment: socially   . Drug use: No  . Sexual activity: Yes    Partners: Male    Birth control/protection: Condom  Other Topics Concern  . Not on file  Social History Narrative  . Not on file   Social Determinants of Health   Financial Resource Strain:   . Difficulty of Paying Living Expenses:   Food Insecurity:   . Worried About Programme researcher, broadcasting/film/video in the Last Year:   . Barista in the Last Year:   Transportation Needs:   . Freight forwarder (Medical):   Marland Kitchen Lack of Transportation (Non-Medical):   Physical Activity:   . Days of Exercise per Week:   . Minutes of Exercise per Session:   Stress:   . Feeling of Stress :   Social Connections:   .  Frequency of Communication with Friends and Family:   . Frequency of Social Gatherings with Friends and Family:   . Attends Religious Services:   . Active Member of Clubs or Organizations:   . Attends Banker Meetings:   Marland Kitchen Marital Status:   Intimate Partner Violence:   . Fear of Current or Ex-Partner:   . Emotionally Abused:   Marland Kitchen Physically Abused:   . Sexually Abused:     Past Surgical History:  Procedure Laterality Date  . DG GALL BLADDER  2003  . GASTRIC BYPASS  2016  . KNEE ARTHROSCOPY Left 08/26/2017    Family History  Problem Relation Age of Onset  . Asthma Mother   . COPD Mother   . Hyperlipidemia Mother   . Hypertension Mother   . Stroke Mother    . Arthritis Father   . Diabetes Father   . Heart disease Father   . Hyperlipidemia Father   . Hypertension Father   . Kidney disease Father   . Anxiety disorder Father   . Asthma Sister   . Depression Sister   . OCD Sister   . Asthma Brother   . Depression Brother   . Uterine cancer Maternal Grandmother 79    Allergies  Allergen Reactions  . Clindamycin Anaphylaxis  . Prednisone Other (See Comments) and Nausea And Vomiting    States any steroids pancreatitis pancreatitis  . Dexamethasone Nausea And Vomiting  . Metformin And Related Other (See Comments)    Severe diarrhea  . Other Nausea And Vomiting    Steroid that starts with a C  - causes Pancreatis   . Morphine Anxiety  . Povidone Iodine Hives and Rash    Current Outpatient Medications on File Prior to Visit  Medication Sig Dispense Refill  . ALPRAZolam (XANAX) 0.5 MG tablet SMARTSIG:1 Tablet(s) By Mouth As Needed    . fexofenadine (ALLEGRA) 180 MG tablet Take 180 mg by mouth daily as needed.     Marland Kitchen FLUoxetine (PROZAC) 10 MG capsule     . lurasidone (LATUDA) 40 MG TABS tablet Take 40 mg by mouth daily with breakfast.    . metroNIDAZOLE (FLAGYL) 500 MG tablet Take 1 tablet (500 mg total) by mouth 2 (two) times daily. 14 tablet 0  . penicillin v potassium (VEETID) 500 MG tablet Take 1 tablet (500 mg total) by mouth 4 (four) times daily. 20 tablet 5  . traZODone (DESYREL) 50 MG tablet Take 0.5-1 tablets (25-50 mg total) by mouth at bedtime as needed for sleep. 30 tablet 1   No current facility-administered medications on file prior to visit.    BP (!) 146/82   Pulse 80   Temp (!) 96 F (35.6 C) (Temporal)   Ht 5\' 7"  (1.702 m)   Wt 299 lb 8 oz (135.9 kg)   SpO2 98%   BMI 46.91 kg/m    Objective:   Physical Exam Cardiovascular:     Rate and Rhythm: Normal rate and regular rhythm.  Pulmonary:     Effort: Pulmonary effort is normal.     Breath sounds: Normal breath sounds.  Musculoskeletal:     Cervical  back: Neck supple.  Skin:    General: Skin is warm and dry.            Assessment & Plan:

## 2019-12-01 NOTE — Patient Instructions (Signed)
Start hydrochlorothiazide 12.5 mg once daily in the morning for blood pressure.  Please schedule a follow up visit to meet back with me in 2-3 weeks for blood pressure check.   It was a pleasure to see you today!   DASH Eating Plan DASH stands for "Dietary Approaches to Stop Hypertension." The DASH eating plan is a healthy eating plan that has been shown to reduce high blood pressure (hypertension). It may also reduce your risk for type 2 diabetes, heart disease, and stroke. The DASH eating plan may also help with weight loss. What are tips for following this plan?  General guidelines  Avoid eating more than 2,300 mg (milligrams) of salt (sodium) a day. If you have hypertension, you may need to reduce your sodium intake to 1,500 mg a day.  Limit alcohol intake to no more than 1 drink a day for nonpregnant women and 2 drinks a day for men. One drink equals 12 oz of beer, 5 oz of wine, or 1 oz of hard liquor.  Work with your health care provider to maintain a healthy body weight or to lose weight. Ask what an ideal weight is for you.  Get at least 30 minutes of exercise that causes your heart to beat faster (aerobic exercise) most days of the week. Activities may include walking, swimming, or biking.  Work with your health care provider or diet and nutrition specialist (dietitian) to adjust your eating plan to your individual calorie needs. Reading food labels   Check food labels for the amount of sodium per serving. Choose foods with less than 5 percent of the Daily Value of sodium. Generally, foods with less than 300 mg of sodium per serving fit into this eating plan.  To find whole grains, look for the word "whole" as the first word in the ingredient list. Shopping  Buy products labeled as "low-sodium" or "no salt added."  Buy fresh foods. Avoid canned foods and premade or frozen meals. Cooking  Avoid adding salt when cooking. Use salt-free seasonings or herbs instead of table  salt or sea salt. Check with your health care provider or pharmacist before using salt substitutes.  Do not fry foods. Cook foods using healthy methods such as baking, boiling, grilling, and broiling instead.  Cook with heart-healthy oils, such as olive, canola, soybean, or sunflower oil. Meal planning  Eat a balanced diet that includes: ? 5 or more servings of fruits and vegetables each day. At each meal, try to fill half of your plate with fruits and vegetables. ? Up to 6-8 servings of whole grains each day. ? Less than 6 oz of lean meat, poultry, or fish each day. A 3-oz serving of meat is about the same size as a deck of cards. One egg equals 1 oz. ? 2 servings of low-fat dairy each day. ? A serving of nuts, seeds, or beans 5 times each week. ? Heart-healthy fats. Healthy fats called Omega-3 fatty acids are found in foods such as flaxseeds and coldwater fish, like sardines, salmon, and mackerel.  Limit how much you eat of the following: ? Canned or prepackaged foods. ? Food that is high in trans fat, such as fried foods. ? Food that is high in saturated fat, such as fatty meat. ? Sweets, desserts, sugary drinks, and other foods with added sugar. ? Full-fat dairy products.  Do not salt foods before eating.  Try to eat at least 2 vegetarian meals each week.  Eat more home-cooked food and less  restaurant, buffet, and fast food.  When eating at a restaurant, ask that your food be prepared with less salt or no salt, if possible. What foods are recommended? The items listed may not be a complete list. Talk with your dietitian about what dietary choices are best for you. Grains Whole-grain or whole-wheat bread. Whole-grain or whole-wheat pasta. Brown rice. [place] Morrow. Bulgur. Whole-grain and low-sodium cereals. Pita bread. Low-fat, low-sodium crackers. Whole-wheat flour tortillas. Vegetables Fresh or frozen vegetables (raw, steamed, roasted, or grilled). Low-sodium or  reduced-sodium tomato and vegetable juice. Low-sodium or reduced-sodium tomato sauce and tomato paste. Low-sodium or reduced-sodium canned vegetables. Fruits All fresh, dried, or frozen fruit. Canned fruit in natural juice (without added sugar). Meat and other protein foods Skinless chicken or [place]. Ground chicken or [place]. Pork with fat trimmed off. Fish and seafood. Egg whites. Dried beans, peas, or lentils. Unsalted nuts, nut butters, and seeds. Unsalted canned beans. Lean cuts of beef with fat trimmed off. Low-sodium, lean deli meat. Dairy Low-fat (1%) or fat-free (skim) milk. Fat-free, low-fat, or reduced-fat cheeses. Nonfat, low-sodium ricotta or cottage cheese. Low-fat or nonfat yogurt. Low-fat, low-sodium cheese. Fats and oils Soft margarine without trans fats. Vegetable oil. Low-fat, reduced-fat, or light mayonnaise and salad dressings (reduced-sodium). Canola, safflower, olive, soybean, and sunflower oils. Avocado. Seasoning and other foods Herbs. Spices. Seasoning mixes without salt. Unsalted popcorn and pretzels. Fat-free sweets. What foods are not recommended? The items listed may not be a complete list. Talk with your dietitian about what dietary choices are best for you. Grains Baked goods made with fat, such as croissants, muffins, or some breads. Dry pasta or rice meal packs. Vegetables Creamed or fried vegetables. Vegetables in a cheese sauce. Regular canned vegetables (not low-sodium or reduced-sodium). Regular canned tomato sauce and paste (not low-sodium or reduced-sodium). Regular tomato and vegetable juice (not low-sodium or reduced-sodium). Angie Fava. Olives. Fruits Canned fruit in a light or heavy syrup. Fried fruit. Fruit in cream or butter sauce. Meat and other protein foods Fatty cuts of meat. Ribs. Fried meat. Berniece Salines. Sausage. Bologna and other processed lunch meats. Salami. Fatback. Hotdogs. Bratwurst. Salted nuts and seeds. Canned beans with added salt. Canned or  smoked fish. Whole eggs or egg yolks. Chicken or [place] with skin. Dairy Whole or 2% milk, cream, and half-and-half. Whole or full-fat cream cheese. Whole-fat or sweetened yogurt. Full-fat cheese. Nondairy creamers. Whipped toppings. Processed cheese and cheese spreads. Fats and oils Butter. Stick margarine. Lard. Shortening. Ghee. Bacon fat. Tropical oils, such as coconut, palm kernel, or palm oil. Seasoning and other foods Salted popcorn and pretzels. Onion salt, garlic salt, seasoned salt, table salt, and sea salt. Worcestershire sauce. Tartar sauce. Barbecue sauce. Teriyaki sauce. Soy sauce, including reduced-sodium. Steak sauce. Canned and packaged gravies. Fish sauce. Oyster sauce. Cocktail sauce. Horseradish that you find on the shelf. Ketchup. Mustard. Meat flavorings and tenderizers. Bouillon cubes. Hot sauce and Tabasco sauce. Premade or packaged marinades. Premade or packaged taco seasonings. Relishes. Regular salad dressings. Where to find more information:  National Heart, Lung, and [place]: https://wilson-eaton.com/  American Heart Association: www.heart.org Summary  The DASH eating plan is a healthy eating plan that has been shown to reduce high blood pressure (hypertension). It may also reduce your risk for type 2 diabetes, heart disease, and stroke.  With the DASH eating plan, you should limit salt (sodium) intake to 2,300 mg a day. If you have hypertension, you may need to reduce your sodium intake to 1,500 mg a day.  When on the DASH eating plan, aim to eat more fresh fruits and vegetables, whole grains, lean proteins, low-fat dairy, and heart-healthy fats.  Work with your health care provider or diet and nutrition specialist (dietitian) to adjust your eating plan to your individual calorie needs. This information is not intended to replace advice given to you by your health care provider. Make sure you discuss any questions you have with your health care provider. Document  Revised: 04/24/2017 Document Reviewed: 05/05/2016 Elsevier Patient Education  2020 [employer].

## 2019-12-01 NOTE — Assessment & Plan Note (Signed)
Prior history, once on medication, off since bypass surgery. Now has gained 30 pounds, lots of stress.  Elevated readings documented on two different days which qualifies her for treatment.  Rx for HCTZ 12.5 mg sent to pharmacy. We will plan to see her back in the office in 2-3 weeks for BP check and BMP.

## 2019-12-02 ENCOUNTER — Other Ambulatory Visit: Payer: Self-pay

## 2019-12-02 ENCOUNTER — Ambulatory Visit
Admission: RE | Admit: 2019-12-02 | Discharge: 2019-12-02 | Disposition: A | Discharge status: Home / Self Care | Payer: BC Managed Care – PPO | Source: Ambulatory Visit | Attending: Advanced Practice Midwife | Admitting: Advanced Practice Midwife

## 2019-12-02 ENCOUNTER — Telehealth: Payer: Self-pay | Admitting: Primary Care

## 2019-12-02 DIAGNOSIS — I10 Essential (primary) hypertension: Secondary | ICD-10-CM

## 2019-12-02 DIAGNOSIS — Z30431 Encounter for routine checking of intrauterine contraceptive device: Secondary | ICD-10-CM | POA: Diagnosis not present

## 2019-12-02 LAB — CERVICOVAGINAL ANCILLARY ONLY
Bacterial Vaginitis (gardnerella): NEGATIVE
Candida Glabrata: NEGATIVE
Candida Vaginitis: NEGATIVE
Comment: NEGATIVE
Comment: NEGATIVE
Comment: NEGATIVE

## 2019-12-02 MED ORDER — HYDROCHLOROTHIAZIDE 12.5 MG PO TABS: 12.5000 mg | ORAL_TABLET | Freq: Every day | ORAL | 0 refills | Status: DC

## 2019-12-02 NOTE — Telephone Encounter (Signed)
Received faxed refill request for HCHTZ 12.5 mg. Insurance will only cover 90 days.  Send refill as requested.

## 2019-12-06 ENCOUNTER — Ambulatory Visit (INDEPENDENT_AMBULATORY_CARE_PROVIDER_SITE_OTHER): Payer: BC Managed Care – PPO | Admitting: Clinical

## 2019-12-06 DIAGNOSIS — F3171 Bipolar disorder, in partial remission, most recent episode hypomanic: Secondary | ICD-10-CM

## 2019-12-12 ENCOUNTER — Encounter: Payer: Self-pay | Admitting: Radiology

## 2019-12-20 ENCOUNTER — Encounter: Payer: Self-pay | Admitting: Obstetrics and Gynecology

## 2019-12-20 ENCOUNTER — Ambulatory Visit (INDEPENDENT_AMBULATORY_CARE_PROVIDER_SITE_OTHER): Payer: BC Managed Care – PPO | Admitting: Obstetrics and Gynecology

## 2019-12-20 ENCOUNTER — Other Ambulatory Visit: Payer: Self-pay

## 2019-12-20 VITALS — BP 135/81 | HR 70

## 2019-12-20 DIAGNOSIS — Z30432 Encounter for removal of intrauterine contraceptive device: Secondary | ICD-10-CM

## 2019-12-20 NOTE — Progress Notes (Signed)
GYNECOLOGY CLINIC PROCEDURE NOTE  Kristie Hendricks is a 34 y.o. [id number] here for IUD removal. No GYN concerns.  Last pap smear was on 01/2019 and was normal.  IUD Removal  Patient identified, informed consent performed, consent signed.  Patient was in the dorsal lithotomy position, normal external genitalia was noted.  A speculum was placed in the patient's vagina, normal discharge was noted, no lesions. The cervix was visualized, no lesions, no abnormal discharge.  The strings of the IUD were grasped and pulled using ring forceps. The IUD was removed in its entirety. Patient tolerated the procedure well.    Patient will use condoms for contraception.  Routine preventative health maintenance measures emphasized.

## 2019-12-22 ENCOUNTER — Other Ambulatory Visit: Payer: Self-pay

## 2019-12-22 ENCOUNTER — Other Ambulatory Visit: Payer: Self-pay | Admitting: Primary Care

## 2019-12-22 ENCOUNTER — Ambulatory Visit: Payer: BC Managed Care – PPO | Admitting: Primary Care

## 2019-12-22 DIAGNOSIS — I10 Essential (primary) hypertension: Secondary | ICD-10-CM

## 2019-12-22 LAB — BASIC METABOLIC PANEL
BUN: 13 mg/dL (ref 6–23)
CO2: 27 mEq/L (ref 19–32)
Calcium: 8.9 mg/dL (ref 8.4–10.5)
Chloride: 106 mEq/L (ref 96–112)
Creatinine, Ser: 0.74 mg/dL (ref 0.40–1.20)
GFR: 90.49 mL/min (ref >60.00)
Glucose, Bld: 94 mg/dL (ref 70–99)
Potassium: 3.9 mEq/L (ref 3.5–5.1)
Sodium: 138 mEq/L (ref 135–145)

## 2019-12-22 MED ORDER — HYDROCHLOROTHIAZIDE 12.5 MG PO TABS: 12.5000 mg | ORAL_TABLET | Freq: Every day | ORAL | 1 refills | Status: AC

## 2019-12-22 NOTE — Addendum Note (Signed)
Addended by: Doreene Nest on: 12/22/2019 08:55 AM   Modules accepted: Orders

## 2019-12-22 NOTE — Patient Instructions (Addendum)
Continue taking hydrochlorothiazide 12.5 mg for blood pressure.  Stop by the lab prior to leaving today. I will notify you of your results once received.   It was a pleasure to see you today!   DASH Eating Plan DASH stands for "Dietary Approaches to Stop Hypertension." The DASH eating plan is a healthy eating plan that has been shown to reduce high blood pressure (hypertension). It may also reduce your risk for type 2 diabetes, heart disease, and stroke. The DASH eating plan may also help with weight loss. What are tips for following this plan?  General guidelines  Avoid eating more than 2,300 mg (milligrams) of salt (sodium) a day. If you have hypertension, you may need to reduce your sodium intake to 1,500 mg a day.  Limit alcohol intake to no more than 1 drink a day for nonpregnant women and 2 drinks a day for men. One drink equals 12 oz of beer, 5 oz of wine, or 1 oz of hard liquor.  Work with your health care provider to maintain a healthy body weight or to lose weight. Ask what an ideal weight is for you.  Get at least 30 minutes of exercise that causes your heart to beat faster (aerobic exercise) most days of the week. Activities may include walking, swimming, or biking.  Work with your health care provider or diet and nutrition specialist (dietitian) to adjust your eating plan to your individual calorie needs. Reading food labels   Check food labels for the amount of sodium per serving. Choose foods with less than 5 percent of the Daily Value of sodium. Generally, foods with less than 300 mg of sodium per serving fit into this eating plan.  To find whole grains, look for the word "whole" as the first word in the ingredient list. Shopping  Buy products labeled as "low-sodium" or "no salt added."  Buy fresh foods. Avoid canned foods and premade or frozen meals. Cooking  Avoid adding salt when cooking. Use salt-free seasonings or herbs instead of table salt or sea salt. Check  with your health care provider or pharmacist before using salt substitutes.  Do not fry foods. Cook foods using healthy methods such as baking, boiling, grilling, and broiling instead.  Cook with heart-healthy oils, such as olive, canola, soybean, or sunflower oil. Meal planning  Eat a balanced diet that includes: ? 5 or more servings of fruits and vegetables each day. At each meal, try to fill half of your plate with fruits and vegetables. ? Up to 6-8 servings of whole grains each day. ? Less than 6 oz of lean meat, poultry, or fish each day. A 3-oz serving of meat is about the same size as a deck of cards. One egg equals 1 oz. ? 2 servings of low-fat dairy each day. ? A serving of nuts, seeds, or beans 5 times each week. ? Heart-healthy fats. Healthy fats called Omega-3 fatty acids are found in foods such as flaxseeds and coldwater fish, like sardines, salmon, and mackerel.  Limit how much you eat of the following: ? Canned or prepackaged foods. ? Food that is high in trans fat, such as fried foods. ? Food that is high in saturated fat, such as fatty meat. ? Sweets, desserts, sugary drinks, and other foods with added sugar. ? Full-fat dairy products.  Do not salt foods before eating.  Try to eat at least 2 vegetarian meals each week.  Eat more home-cooked food and less restaurant, buffet, and fast food.  When eating at a restaurant, ask that your food be prepared with less salt or no salt, if possible. What foods are recommended? The items listed may not be a complete list. Talk with your dietitian about what dietary choices are best for you. Grains Whole-grain or whole-wheat bread. Whole-grain or whole-wheat pasta. Brown rice. [place]. Bulgur. Whole-grain and low-sodium cereals. Pita bread. Low-fat, low-sodium crackers. Whole-wheat flour tortillas. Vegetables Fresh or frozen vegetables (raw, steamed, roasted, or grilled). Low-sodium or reduced-sodium tomato and vegetable  juice. Low-sodium or reduced-sodium tomato sauce and tomato paste. Low-sodium or reduced-sodium canned vegetables. Fruits All fresh, dried, or frozen fruit. Canned fruit in natural juice (without added sugar). Meat and other protein foods Skinless chicken or [place]. Ground chicken or [place]. Pork with fat trimmed off. Fish and seafood. Egg whites. Dried beans, peas, or lentils. Unsalted nuts, nut butters, and seeds. Unsalted canned beans. Lean cuts of beef with fat trimmed off. Low-sodium, lean deli meat. Dairy Low-fat (1%) or fat-free (skim) milk. Fat-free, low-fat, or reduced-fat cheeses. Nonfat, low-sodium ricotta or cottage cheese. Low-fat or nonfat yogurt. Low-fat, low-sodium cheese. Fats and oils Soft margarine without trans fats. Vegetable oil. Low-fat, reduced-fat, or light mayonnaise and salad dressings (reduced-sodium). Canola, safflower, olive, soybean, and sunflower oils. Avocado. Seasoning and other foods Herbs. Spices. Seasoning mixes without salt. Unsalted popcorn and pretzels. Fat-free sweets. What foods are not recommended? The items listed may not be a complete list. Talk with your dietitian about what dietary choices are best for you. Grains Baked goods made with fat, such as croissants, muffins, or some breads. Dry pasta or rice meal packs. Vegetables Creamed or fried vegetables. Vegetables in a cheese sauce. Regular canned vegetables (not low-sodium or reduced-sodium). Regular canned tomato sauce and paste (not low-sodium or reduced-sodium). Regular tomato and vegetable juice (not low-sodium or reduced-sodium). Angie Fava. Olives. Fruits Canned fruit in a light or heavy syrup. Fried fruit. Fruit in cream or butter sauce. Meat and other protein foods Fatty cuts of meat. Ribs. Fried meat. Berniece Salines. Sausage. Bologna and other processed lunch meats. Salami. Fatback. Hotdogs. Bratwurst. Salted nuts and seeds. Canned beans with added salt. Canned or smoked fish. Whole eggs or egg yolks.  Chicken or [place] with skin. Dairy Whole or 2% milk, cream, and half-and-half. Whole or full-fat cream cheese. Whole-fat or sweetened yogurt. Full-fat cheese. Nondairy creamers. Whipped toppings. Processed cheese and cheese spreads. Fats and oils Butter. Stick margarine. Lard. Shortening. Ghee. Bacon fat. Tropical oils, such as coconut, palm kernel, or palm oil. Seasoning and other foods Salted popcorn and pretzels. Onion salt, garlic salt, seasoned salt, table salt, and sea salt. Worcestershire sauce. Tartar sauce. Barbecue sauce. Teriyaki sauce. Soy sauce, including reduced-sodium. Steak sauce. Canned and packaged gravies. Fish sauce. Oyster sauce. Cocktail sauce. Horseradish that you find on the shelf. Ketchup. Mustard. Meat flavorings and tenderizers. Bouillon cubes. Hot sauce and Tabasco sauce. Premade or packaged marinades. Premade or packaged taco seasonings. Relishes. Regular salad dressings. Where to find more information:  National Heart, Lung, and [place]: https://wilson-eaton.com/  American Heart Association: www.heart.org Summary  The DASH eating plan is a healthy eating plan that has been shown to reduce high blood pressure (hypertension). It may also reduce your risk for type 2 diabetes, heart disease, and stroke.  With the DASH eating plan, you should limit salt (sodium) intake to 2,300 mg a day. If you have hypertension, you may need to reduce your sodium intake to 1,500 mg a day.  When on the DASH eating plan,  aim to eat more fresh fruits and vegetables, whole grains, lean proteins, low-fat dairy, and heart-healthy fats.  Work with your health care provider or diet and nutrition specialist (dietitian) to adjust your eating plan to your individual calorie needs. This information is not intended to replace advice given to you by your health care provider. Make sure you discuss any questions you have with your health care provider. Document Revised: 04/24/2017 Document Reviewed:  05/05/2016 Elsevier Patient Education  2020 [employer].

## 2019-12-22 NOTE — Assessment & Plan Note (Signed)
Improved on HCTZ 12.5 mg, not quite at goal. Will have her work on weight loss through diet and exercise in order to get to goal. She agrees.  BMP pending and if normal, refill HCTZ 12.5 mg.

## 2019-12-22 NOTE — Progress Notes (Signed)
Subjective:    Patient ID: Kristie Hendricks, female    DOB: 06-10-1985, 34 y.o.   MRN: [id number]  HPI  This visit occurred during the SARS-CoV-2 public health emergency.  Safety protocols were in place, including screening questions prior to the visit, additional usage of staff PPE, and extensive cleaning of exam room while observing appropriate contact time as indicated for disinfecting solutions.   Kristie Hendricks is a 34 year old female with a history of hypertension, GERD, PCOS, recurrent cellulitis of lower extremity, chronic back pian, lymphedema who presents today for follow up of hypertension.  She was last evaluated a few weeks ago for elevated blood pressure/return of hypertension. During her last visit she endorsed a weight gain of 30 pounds and increased stress. She had notified elevated readings on several occasions so we decided to trial HCTZ 12.5 mg.   Since her last visit she's taking her HCTZ daily. Her headaches have improved, also feeling better in general. She is not checking BP at home. She is struggling with anxiety, is working with her psychiatrist and therapist, next visit is in a few weeks.   BP Readings from Last 3 Encounters:  12/22/19 (!) 132/84  12/20/19 (!) 135/81  12/01/19 (!) 146/82   Wt Readings from Last 3 Encounters:  12/22/19 (!) 297 lb 8 oz (134.9 kg)  12/01/19 299 lb 8 oz (135.9 kg)  06/13/19 281 lb 12 oz (127.8 kg)     Review of Systems  Constitutional: Negative for fatigue.  Eyes: Negative for visual disturbance.  Respiratory: Negative for shortness of breath.   Cardiovascular: Negative for chest pain.  Neurological: Positive for light-headedness. Negative for headaches.  Psychiatric/Behavioral: The patient is nervous/anxious.        Past Medical History:  Diagnosis Date  . Anxiety   . Asthma   . Bipolar disorder (HCC)   . Chickenpox   . Depression   . Frequent headaches   . Hypertension   . Migraines   . Pancreatitis      Social History    Socioeconomic History  . Marital status: Significant Other    Spouse name: Not on file  . Number of children: 0  . Years of education: Not on file  . Highest education level: Associate degree: occupational, Scientist, product/process development, or vocational program  Occupational History  . Not on file  Tobacco Use  . Smoking status: Never Smoker  . Smokeless tobacco: Never Used  Vaping Use  . Vaping Use: Never used  Substance and Sexual Activity  . Alcohol use: Yes    Alcohol/week: 1.0 standard drink    Types: 1 Glasses of wine per week    Comment: socially   . Drug use: No  . Sexual activity: Yes    Partners: Male    Birth control/protection: Condom  Other Topics Concern  . Not on file  Social History Narrative  . Not on file   Social Determinants of Health   Financial Resource Strain:   . Difficulty of Paying Living Expenses:   Food Insecurity:   . Worried About Programme researcher, broadcasting/film/video in the Last Year:   . Barista in the Last Year:   Transportation Needs:   . Freight forwarder (Medical):   Marland Kitchen Lack of Transportation (Non-Medical):   Physical Activity:   . Days of Exercise per Week:   . Minutes of Exercise per Session:   Stress:   . Feeling of Stress :   Social Connections:   .  Frequency of Communication with Friends and Family:   . Frequency of Social Gatherings with Friends and Family:   . Attends Religious Services:   . Active Member of Clubs or Organizations:   . Attends Banker Meetings:   Marland Kitchen Marital Status:   Intimate Partner Violence:   . Fear of Current or Ex-Partner:   . Emotionally Abused:   Marland Kitchen Physically Abused:   . Sexually Abused:     Past Surgical History:  Procedure Laterality Date  . DG GALL BLADDER  2003  . GASTRIC BYPASS  2016  . KNEE ARTHROSCOPY Left 08/26/2017    Family History  Problem Relation Age of Onset  . Asthma Mother   . COPD Mother   . Hyperlipidemia Mother   . Hypertension Mother   . Stroke Mother   . Arthritis Father     . Diabetes Father   . Heart disease Father   . Hyperlipidemia Father   . Hypertension Father   . Kidney disease Father   . Anxiety disorder Father   . Asthma Sister   . Depression Sister   . OCD Sister   . Asthma Brother   . Depression Brother   . Uterine cancer Maternal Grandmother 79    Allergies  Allergen Reactions  . Clindamycin Anaphylaxis  . Prednisone Other (See Comments) and Nausea And Vomiting    States any steroids pancreatitis pancreatitis  . Dexamethasone Nausea And Vomiting  . Metformin And Related Other (See Comments)    Severe diarrhea  . Other Nausea And Vomiting    Steroid that starts with a C  - causes Pancreatis   . Morphine Anxiety  . Povidone Iodine Hives and Rash    Current Outpatient Medications on File Prior to Visit  Medication Sig Dispense Refill  . ALPRAZolam (XANAX) 0.5 MG tablet SMARTSIG:1 Tablet(s) By Mouth As Needed    . fexofenadine (ALLEGRA) 180 MG tablet Take 180 mg by mouth daily as needed.     Marland Kitchen FLUoxetine (PROZAC) 20 MG tablet Take 20 mg by mouth daily.    . hydrochlorothiazide (HYDRODIURIL) 12.5 MG tablet Take 1 tablet (12.5 mg total) by mouth daily. For blood pressure. 90 tablet 0  . lurasidone (LATUDA) 40 MG TABS tablet Take 40 mg by mouth daily with breakfast.    . metroNIDAZOLE (FLAGYL) 500 MG tablet Take 1 tablet (500 mg total) by mouth 2 (two) times daily. 14 tablet 0  . penicillin v potassium (VEETID) 500 MG tablet Take 1 tablet (500 mg total) by mouth 4 (four) times daily. 20 tablet 5  . traZODone (DESYREL) 50 MG tablet Take 0.5-1 tablets (25-50 mg total) by mouth at bedtime as needed for sleep. 30 tablet 1   No current facility-administered medications on file prior to visit.    BP (!) 132/84   Pulse 82   Temp (!) 96.3 F (35.7 C) (Temporal)   Ht 5\' 7"  (1.702 m)   Wt (!) 297 lb 8 oz (134.9 kg)   SpO2 98%   BMI 46.60 kg/m    Objective:   Physical Exam Cardiovascular:     Rate and Rhythm: Normal rate and regular  rhythm.  Pulmonary:     Effort: Pulmonary effort is normal.     Breath sounds: Normal breath sounds.  Musculoskeletal:     Cervical back: Neck supple.  Skin:    General: Skin is warm and dry.            Assessment & Plan:

## 2019-12-27 ENCOUNTER — Ambulatory Visit (INDEPENDENT_AMBULATORY_CARE_PROVIDER_SITE_OTHER): Payer: BC Managed Care – PPO | Admitting: Clinical

## 2019-12-27 DIAGNOSIS — F332 Major depressive disorder, recurrent severe without psychotic features: Secondary | ICD-10-CM | POA: Diagnosis not present

## 2020-01-02 DIAGNOSIS — F3181 Bipolar II disorder: Secondary | ICD-10-CM | POA: Diagnosis not present

## 2020-01-03 ENCOUNTER — Ambulatory Visit (INDEPENDENT_AMBULATORY_CARE_PROVIDER_SITE_OTHER): Payer: BC Managed Care – PPO | Admitting: Clinical

## 2020-01-03 DIAGNOSIS — F332 Major depressive disorder, recurrent severe without psychotic features: Secondary | ICD-10-CM

## 2020-01-04 ENCOUNTER — Ambulatory Visit: Payer: BC Managed Care – PPO | Admitting: Family Medicine

## 2020-01-10 ENCOUNTER — Ambulatory Visit (INDEPENDENT_AMBULATORY_CARE_PROVIDER_SITE_OTHER): Payer: BC Managed Care – PPO | Admitting: Clinical

## 2020-01-10 DIAGNOSIS — F332 Major depressive disorder, recurrent severe without psychotic features: Secondary | ICD-10-CM

## 2020-01-17 ENCOUNTER — Ambulatory Visit: Payer: BC Managed Care – PPO | Admitting: Clinical

## 2020-01-19 ENCOUNTER — Ambulatory Visit (INDEPENDENT_AMBULATORY_CARE_PROVIDER_SITE_OTHER): Payer: BC Managed Care – PPO | Admitting: Clinical

## 2020-01-19 DIAGNOSIS — F332 Major depressive disorder, recurrent severe without psychotic features: Secondary | ICD-10-CM | POA: Diagnosis not present

## 2020-01-24 ENCOUNTER — Ambulatory Visit (INDEPENDENT_AMBULATORY_CARE_PROVIDER_SITE_OTHER): Payer: BC Managed Care – PPO | Admitting: Clinical

## 2020-01-24 DIAGNOSIS — F332 Major depressive disorder, recurrent severe without psychotic features: Secondary | ICD-10-CM | POA: Diagnosis not present

## 2020-02-07 ENCOUNTER — Ambulatory Visit (INDEPENDENT_AMBULATORY_CARE_PROVIDER_SITE_OTHER): Payer: BC Managed Care – PPO | Admitting: Clinical

## 2020-02-07 DIAGNOSIS — F332 Major depressive disorder, recurrent severe without psychotic features: Secondary | ICD-10-CM | POA: Diagnosis not present

## 2020-02-14 ENCOUNTER — Ambulatory Visit: Payer: BC Managed Care – PPO | Admitting: Clinical

## 2020-02-15 ENCOUNTER — Ambulatory Visit (INDEPENDENT_AMBULATORY_CARE_PROVIDER_SITE_OTHER): Payer: BC Managed Care – PPO | Admitting: Clinical

## 2020-02-15 DIAGNOSIS — F3171 Bipolar disorder, in partial remission, most recent episode hypomanic: Secondary | ICD-10-CM

## 2020-02-21 ENCOUNTER — Ambulatory Visit (INDEPENDENT_AMBULATORY_CARE_PROVIDER_SITE_OTHER): Payer: BC Managed Care – PPO | Admitting: Clinical

## 2020-02-21 DIAGNOSIS — F332 Major depressive disorder, recurrent severe without psychotic features: Secondary | ICD-10-CM

## 2020-02-25 DIAGNOSIS — F3181 Bipolar II disorder: Secondary | ICD-10-CM | POA: Diagnosis not present

## 2020-02-28 ENCOUNTER — Ambulatory Visit (INDEPENDENT_AMBULATORY_CARE_PROVIDER_SITE_OTHER): Payer: BC Managed Care – PPO | Admitting: Clinical

## 2020-02-28 DIAGNOSIS — F332 Major depressive disorder, recurrent severe without psychotic features: Secondary | ICD-10-CM | POA: Diagnosis not present

## 2020-03-06 ENCOUNTER — Ambulatory Visit: Payer: BC Managed Care – PPO | Admitting: Clinical

## 2020-03-13 ENCOUNTER — Ambulatory Visit: Payer: BC Managed Care – PPO | Admitting: Clinical

## 2020-04-18 ENCOUNTER — Ambulatory Visit (INDEPENDENT_AMBULATORY_CARE_PROVIDER_SITE_OTHER): Payer: BC Managed Care – PPO | Admitting: Clinical

## 2020-04-18 DIAGNOSIS — F332 Major depressive disorder, recurrent severe without psychotic features: Secondary | ICD-10-CM | POA: Diagnosis not present

## 2020-07-19 ENCOUNTER — Ambulatory Visit (INDEPENDENT_AMBULATORY_CARE_PROVIDER_SITE_OTHER): Payer: BC Managed Care – PPO | Admitting: Clinical

## 2020-07-19 DIAGNOSIS — F332 Major depressive disorder, recurrent severe without psychotic features: Secondary | ICD-10-CM

## 2020-08-02 ENCOUNTER — Other Ambulatory Visit: Payer: Self-pay | Admitting: Primary Care

## 2020-08-02 DIAGNOSIS — I10 Essential (primary) hypertension: Secondary | ICD-10-CM

## 2020-08-02 NOTE — Telephone Encounter (Signed)
Is she living in [place] now? Received refill request of HCTZ from [facility] in [place]... I do need to see her this Summer for follow up, not sure if she's moved there or what.

## 2020-08-03 NOTE — Telephone Encounter (Signed)
Patient has moved to [place] and has new PCP. Will remove kate as pcp and decline refill. Patient is aware.

## 2021-09-09 ENCOUNTER — Inpatient Hospital Stay
Admit: 2021-09-09 | Discharge: 2021-09-13 | Disposition: A | Discharge status: Home / Self Care | Payer: PRIVATE HEALTH INSURANCE | Attending: Neurology | Admitting: Neurology

## 2021-09-09 DIAGNOSIS — F3112 Bipolar disorder, current episode manic without psychotic features, moderate: Principal | ICD-10-CM

## 2021-09-09 MED ORDER — OXcarbazepine (TRILEPTAL) tablet 450 mg
150 | Freq: Every evening | ORAL | Status: AC
Start: 2021-09-09 — End: 2021-09-10
  Administered 2021-09-10: 02:00:00 450 mg via ORAL

## 2021-09-09 MED ORDER — FLUoxetine (PROZAC) capsule 20 mg
20 | Freq: Every day | ORAL | Status: AC
Start: 2021-09-09 — End: 2021-09-13
  Administered 2021-09-10 – 2021-09-13 (×4): 20 mg via ORAL

## 2021-09-09 MED ORDER — acetaminophen (TYLENOL) tablet 650 mg
325 | Freq: Four times a day (QID) | ORAL | Status: AC | PRN
Start: 2021-09-09 — End: 2021-09-13
  Administered 2021-09-11 – 2021-09-12 (×3): 650 mg via ORAL

## 2021-09-09 MED ORDER — olanzapine zydis (ZYPREXA) disintegrating tablet 5 mg
5 | ORAL | Status: AC | PRN
Start: 2021-09-09 — End: 2021-09-13
  Administered 2021-09-09 – 2021-09-13 (×3): 5 mg via ORAL

## 2021-09-09 MED ORDER — OLANZapine (ZYPREXA) injection 10 mg
10 | INTRAMUSCULAR | Status: AC | PRN
Start: 2021-09-09 — End: 2021-09-13

## 2021-09-09 MED ORDER — traZODone (DESYREL) tablet 50 mg
50 | Freq: Every evening | ORAL | Status: AC | PRN
Start: 2021-09-09 — End: 2021-09-13
  Administered 2021-09-12: 01:00:00 50 mg via ORAL

## 2021-09-09 MED ORDER — OXcarbazepine (TRILEPTAL) tablet 300 mg
300 | Freq: Every day | ORAL | Status: AC
Start: 2021-09-09 — End: 2021-09-10

## 2021-09-09 MED ORDER — sterile water (PF) Soln 2.1 mL
Freq: Once | INTRAMUSCULAR | Status: AC | PRN
Start: 2021-09-09 — End: 2021-09-09

## 2021-09-09 MED ORDER — clonazePAM (klonoPIN) tablet 0.5 mg
0.5 | Freq: Every day | ORAL | Status: AC | PRN
Start: 2021-09-09 — End: 2021-09-13
  Administered 2021-09-09: 22:00:00 0.5 mg via ORAL

## 2021-09-09 MED ORDER — propranoloL (INDERAL) tablet 10 mg
10 | Freq: Two times a day (BID) | ORAL | Status: AC | PRN
Start: 2021-09-09 — End: 2021-09-13
  Administered 2021-09-09 – 2021-09-13 (×2): 10 mg via ORAL

## 2021-09-09 MED ORDER — OXcarbazepine (TRILEPTAL) tablet 300 mg
300 | Freq: Every day | ORAL | Status: AC
Start: 2021-09-09 — End: 2021-09-09

## 2021-09-09 MED ORDER — OXcarbazepine (TRILEPTAL) tablet 450 mg
150 | Freq: Every evening | ORAL | Status: AC
Start: 2021-09-09 — End: 2021-09-09

## 2021-09-09 MED ORDER — hydroCHLOROthiazide (HYDRODIURIL) tablet 25 mg
25 | Freq: Every day | ORAL | Status: AC
Start: 2021-09-09 — End: 2021-09-13
  Administered 2021-09-10 – 2021-09-13 (×4): 25 mg via ORAL

## 2021-09-09 MED ORDER — polyethylene glycol (MIRALAX) packet 17 g
17 | Freq: Every day | ORAL | Status: AC | PRN
Start: 2021-09-09 — End: 2021-09-13

## 2021-09-09 MED FILL — OLANZAPINE 5 MG DISINTEGRATING TABLET: 5 5 MG | ORAL | Qty: 1

## 2021-09-09 MED FILL — CLONAZEPAM 0.5 MG TABLET: 0.5 0.5 MG | ORAL | Qty: 1

## 2021-09-09 MED FILL — OXCARBAZEPINE 150 MG TABLET: 150 150 MG | ORAL | Qty: 1

## 2021-09-09 MED FILL — PROPRANOLOL 10 MG TABLET: 10 10 MG | ORAL | Qty: 1

## 2021-09-09 NOTE — Unmapped (Addendum)
[facility]   Inpatient Shift Assessment    Name: Sarah [name]  MRN: [id number]  Admission date: 09/09/2021        Vitals:     Temp: 98.1 ??F (36.7 ??C)  Heart Rate: 77  Resp: 12  BP: 148/75  BP Location: Right arm  BP Method: Automatic  Patient Position: Standing  SpO2: 98 %  O2 Device: None (Room air)  Height: 5' 7 (170.2 cm)  Weight: (!) 295 lb 9.6 oz (134.1 kg)  BMI (Calculated): 46.4      Pain/ Pain Reassessment:        Re-Assessment NRS Pain Score: 0 - No Pain      Intake:            Output:            POCT Glucose:            [place] Suicide Severity Rating Scale - Frequent Screener:          Assigned Risk: Low Risk  Low Risk Interventions: 15 minute checks at irregular intervals        Mental Status Exam:     Apparent Age: Appears Actual Age  Hygiene/Grooming: Well Groomed  General Attitude: Within Defined Limits  Motor Activity: Poor posture  Eye Contact: Appropriate  Facial Expression: Anxious  Patient Behaviors: Anxious  Impulsivity: Occasional  Speech Pattern: Within Defined Limits  Communication barriers: Other (Comment)  Mood: Anxious  Affect: Appropriate to Circumstances  Affect congruent with mood: Yes  Content: Unremarkable  Delusions: Paranoid  Perception: Appropriate  Hallucination: None  Thought content appropriate to situation: No  Danger to Others (WDL): Within Defined Limits  Thought process: Appropriate  Memory Impairment: None  Cognition: Poor judgement;Impulsive  Orientation Level: Oriented X4  Intelligence: Average  Attention Span: Easily distracted  Insight: Impaired  Judgement: Impaired  Appetite Change: Excessive  Do you have any sleep concerns?: Insomnia;Sleep disruption (Comment)  Libido: Increased        Edmonson Fall Risk     Age: Less than 50  Mental Status: Fully Alert/ Oriented at all times  Elimination: Independent with control of bowel/ bladder  Medication: Psychotropic medications ( including benzos and antidepressants)  Ambulation/ Balance: Independent/ Steady gait/  Immobile  Nutrition: No apparent abnormalities with appetite  Sleep Disturbance: No sleep disturbance  History of Falls: No history of falls  Secondary Diagnosis: Cardiac/ HTN/ Diabetic  [place] Fall Risk Score: 53             Patient Checks:     Interventions: Call bell within reach  Visual Checks: Standard Q15  Arm Bands On: ID  Patient Checked for Contraband: Belongings checked, Body checked, Clothing checked        Safety:     Protective factors: Sense of optimism/sef efficacy  Self Injurious Thoughts: Denies  Self Injurious Behaviors: None observed  Thoughts of Harming Others: Denies  History of Aggression or Violence: No history  Current Thoughts of Aggression: No  Does Patient's Family Have a History of Aggression or Violence?: No  Restrained/secluded in past year?: No  If restrained/secluded, notify someone?: No  Methods to Calm Down: Quiet time in room;Change of environment;1:1 Time        DASA     Irritablity: Yes  Verbal Threats: No  Impulsivity: No  Negative attitude: No  Unwillingness to follow direction: No  Sensitivity to perceived provocation: No  Easily angered when request denied: No  Total Score - DASA: 1  Withdrawl Symptoms:            Hygiene:     Bath/Shower: Patient Declined  Level of Assistance: Independent      Nutrition Screen:     Feeding: Able to feed self  Diet Type: Regular  Appetite: Garen Lah, RN  09/09/2021  10:35 PM  Patient arrived on the unit at  accompanied by RN from intake @ 1600 Patient was oriented to her room and demonstrated use of call light. Unit Expectations and Guidelines and Programming schedule were reviewed with patient and she was given a copy of each. Patient filled out her menu and ate. Patient denies SI and reports that she will not make any attempts, and has no plan, here on the unit at [facility]. Patient agrees to notify staff immediately if this changes and she feels at risk for harming herself. Skin assessment was completed with all articles  of clothing removed and searched by intake staff.  is able to verbalize that she will remain safe at this time. Therefore, she may have her own clothes and standard linens.       Pt restless , energetic, sporadicly moving,stated  I feel like my insides are coming out and I am going to burst!    Pt rating anxiety and agitation 10/10 Zyprexa Zydis given @ 1655  Pt continues to dance outside, appeared animated with high pitch voice, pt shared that 8 years ago she experienced similar outburst energy and stated that she was  admitted  to mental health hospital inpatient, however can not remember what medication she was taken.  Propranolol given @ 1716 for anxiety, pt reported that previous medications were not effective. Pt received Klonopin 0.5 @ 1750 which was effective, pt eventually retired to her room and appeared to be sleeping.   Pt was med compliant   [place]= SSRI Low Risk.

## 2021-09-09 NOTE — Unmapped (Signed)
Group Note    Patient Name: Sarah [name]    Date: 09/09/2021     Time: 1930     Group Type: Process    Group Name: Wrap-Up     Group Objective: To process the patient's day/evening, allow patient's to voice any questions/concerns, and reflect upon emotions to evaluate the progression. Patients are encouraged to reflect on their day and share positive experiences.       Attendance: Did not attend    Interactions: Did not interact    Mood/Affect: Unable to assess    Patient Participation: Pt was encouraged to attend group but declined due to resting in her room at the time of group. Group materials made available and MHS able to review information with pt 1:1 if necessary.        Peggye Pitt, MHS

## 2021-09-09 NOTE — Unmapped (Signed)
Group Note:     Patient Name: Sarah [name]    Date: 09/09/2021     Time: 1630    Group Type: DBT-Emotion Regulation/Distress Tolerance      Group Name: Managing Extreme Emotions     Group Objective: Taught patient's the importance of knowing when they are at their skills breakdown point. Informed the patient's that if they are in their skills breakdown point they need to use distress tolerance skills to bring down their emotional arousal so that they are able to use more complicated skills. Explained when to use crisis survival skills and introduced the distress tolerance skill of pros and cons.     Attendance: Did not attend    Interactions: Did not interact    Mood/Affect: Unable to assess    Patient Participation: pt did not attend and slept through this group. Materials were offered and staff will inform of opportunity to ask questions.    Rehana Uncapher, MHS

## 2021-09-09 NOTE — Unmapped (Signed)
[place]  [facility]  Medical History & Physical    CPT Code: [phone]    Sarah [name]  [id number]    Date of Evaluation: 09/09/2021  Reason for Admission: I have that energy of 1000 suns burning inside of me    [name] came to [facility] feeling with very high level of energy that she knows is not normal. She started having this hyperactive feeling about 3 weeks.    She had been taking Contrave (naltrexone/bupropion) for weight loss in January 2. She had not noticed any adverse effects until End of February when she started to get vertigo. She stopped taking it entirely at the end of March. One week prior to stopping, she noticed having increased energy, hypersexuality, and insomnia.    She has physical signs of heat intolerance, jitteriness, tingling sensation in fingers, and daily headache. Also has had very difficult time maintaining concentration/focus at work.  She does not have palpitations. TSH checked in January was normal at 1.910.    Sleep has decreased from about 9.5 hours to about 4-5 hours. Initially she wakes up feeling tired, but after 15-30 minutes she feels full of energy.    No drug use. Rarely has alcohol - only two beverages in past 4 months.    Presents as manic, but has never had manic episodes before. Reports increased spending habits, although these do not seem extreme ($75 on decorations for nephew's birthday party, $100 on a present for him, and buying a lot of clothes).      History     Allergies   Allergen Reactions   ??? Clindamycin      When took oral form throat swelled for a few days, but has taken since then with no reaction   ??? Corticosteroids (Glucocorticoids)    ??? Prednisone        Past Medical History:   Diagnosis Date   ??? Acute bronchitis 03/02/2012   ??? Anxiety    ??? Asthma     as a child, no issue since age 30   ??? Back pain 05/04/2012   ??? Bipolar affective disorder (CMS-HCC)    ??? Carpal tunnel syndrome, left 12/16/2012   ??? Chronic acquired lymphedema    ???  Drug-induced acute pancreatitis 05/26/2012    Victoza   ??? Fatigue 07/15/2011   ??? Herpes zoster 12/13/2012   ??? Hypertension    ??? Morbid obesity (CMS-HCC)    ??? PCOS (polycystic ovarian syndrome)    ??? PONV (postoperative nausea and vomiting)    ??? 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 UTI (lower urinary tract infection) 01/20/2012     Past Surgical History:   Procedure Laterality Date   ??? CHOLECYSTECTOMY     ??? KNEE CARTILAGE SURGERY       Family History   Problem Relation Age of Onset   ??? Hypertension Mother    ??? Anxiety disorder Father    ??? Diabetes Father    ??? Hypertension Father    ??? Depression Sister    ??? OCD Sister    ??? Deep vein thrombosis Sister    ??? Pulmonary embolism Sister    ??? Anxiety disorder Brother    ??? Depression Brother    ??? Schizophrenia Maternal Uncle    ??? Mental illness Maternal Uncle         Schizophrenia   ??? Mental illness Paternal Grandfather  Schizophrenia         Substance Use History:   Social History     Substance and Sexual Activity   Alcohol Use Yes    Comment:  social drinker with last drink 3 weeks ago     Social History     Tobacco Use   Smoking Status Never   Smokeless Tobacco Never     Social History     Substance and Sexual Activity   Drug Use No         Review of Systems     Review of Systems   Constitutional: Negative for chills and fever.   HENT: Negative for congestion, hearing loss and sore throat.    Eyes: Negative for blurred vision and double vision.   Respiratory: Negative for cough, shortness of breath and wheezing.    Cardiovascular: Negative for chest pain and palpitations.   Gastrointestinal: Negative for abdominal pain, constipation, diarrhea, nausea and vomiting.   Genitourinary: Negative for dysuria, frequency and urgency.   Musculoskeletal: Positive for back pain (chronic). Negative for joint pain.   Skin: Positive for itching (hands and feet, usually at night). Negative for rash.   Neurological: Positive for tingling (fingers) and headaches. Negative for  dizziness.   Endo/Heme/Allergies: Negative for polydipsia.   Psychiatric/Behavioral: Negative for depression and hallucinations. The patient is not nervous/anxious and does not have insomnia.           Physical Exam     Vitals:    09/09/21 1301 09/09/21 1715   BP: 145/80 148/75   BP Location: Right arm Right arm   Patient Position: Sitting Standing   Pulse: 62 77   Resp: 12    Temp: 98.1 ??F (36.7 ??C)    SpO2: 98%    Weight: (!) 295 lb 9.6 oz (134.1 kg)    Height: 5' 7 (1.702 m)        General Appearance: well groomed, obese  Pulm: lungs clear to auscultation bilaterally  CV: S1/S2 and No murmur  GI: non-tender, non-distended  Extr: no clubbing, cyanosis or edema  Mental Status: alert and oriented to person, place, time, situation  CN II: pupils equal, round, reactive to light with accommodation  CN III/IV/VI: extraocular movements intact   CN V: facial sensation normal in VI, VII, VII,  Assessed by light touch.  CN VII: facial motion intact/symmetric. Assessed by smile/frown, puffing cheeks, raise eyebrows, closing eyes tightly  CN VIII: hearing intact to finger rub  CN IX/X: no dysarthria or dysphagia and voice not hoarse  CN XI: sternocleidomastoid 5/5 symmetric strength, trapezius 5/5 symmetric strength  CN XII: tongue protrudes midline  Motor: Bulk: normal  Tone: normal in all four extremities  Strength: 5 in all four extremities  Sensory: light touch intact bilaterally upper and lower extremities  Coord: no dysmetria or tremor noted, finger nose finger normal bilaterally, finger tapping normal bilaterally  Gait: normal gait  Frontal Release Reflexes: glabellar tap normal  Clonus: normal  Dysarthia: no dysarthria  Additional Exam: energetic, but speech not pressured      Labs     Labs not available at the time of H&P.      Assessment and Plan     * Bipolar 1 disorder, manic, moderate (CMS-HCC)- (present on admission)  The patient sees [facility]. Had previously been on  lurasidone for about 2 years, but stopped in November 2022 due to weight gain. Now on oxcarbazepine.   PLAN:  ??  Plan per psychiatry      HTN, goal below 140/90- (present on admission)  The patient's blood pressure is slightly above goal. BP: 148/75. Might be exacerbated by inpatient stay.  PLAN:  ?? Continue hydrochlorothiazide 25 mg PO daily            Larwance Rote, MD  09/09/2021 5:45 PM
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
## 2021-09-09 NOTE — Unmapped (Signed)
Group Note    Patient Name: Sarah [name]    Date: 09/09/2021      Time: 1500     Group Type: Enrichment    Group Name: Poetry In Me    Group Objective: To explore the power and usefulness of poetry in self-discovery and spiritual support.  Participants will write their own ???I AM??? poem as an exercise in self-expression and seeking deeper meaning.     Attendance: Did not attend    Interactions: Did not interact    Mood/Affect: Unable to assess    Patient Participation: Patient was not interested in the topic. Handouts were left on the unit. Spiritual Care available if patient would like to review or discuss materials.       Janeece FittingLaura Aero Drummonds, MA, [facility]

## 2021-09-09 NOTE — Unmapped (Signed)
This Rn received telephone order from Dr Sherlon Handing for PRN Clonopin 0.5mg  PRN daily as it was noted in surescripts this is an order.

## 2021-09-09 NOTE — Unmapped (Signed)
The patient's blood pressure is slightly above goal. BP: 148/75. Might be exacerbated by inpatient stay.  PLAN:  ?? Continue hydrochlorothiazide 25 mg PO daily

## 2021-09-09 NOTE — Unmapped (Signed)
The patient sees Heide Spark at [facility]. Had previously been on lurasidone for about 2 years, but stopped in November 2022 due to weight gain. Now on oxcarbazepine.   PLAN:  ?? Plan per psychiatry

## 2021-09-10 LAB — LIPID PANEL
Cholesterol, Total: 127 MG/DL (ref <200)
HDL: 37 MG/DL
LDL Cholesterol: 51 MG/DL
Triglycerides: 196 MG/DL (ref <150)
VLDL Cholesterol Cal: 39 MG/DL (ref 4–30)

## 2021-09-10 LAB — LCOH-QUALITATIVE DRUG SCR
Amphetamine, UR: NOT DETECTED
Barbiturate, UR: NOT DETECTED
Benzodiazepine UR: NOT DETECTED
Buprenorphine, Urine: NOT DETECTED
Cocaine (Metabolite), UR: NOT DETECTED
Creatinine, Random Urine: >25 MG/DL (ref >25)
Fentanyl, Ur: NOT DETECTED
Marijuana, Qual: NOT DETECTED
Methadone Metabolite (EDDP): NOT DETECTED
Methadone, UR: NOT DETECTED
Opiates UR: NOT DETECTED
Oxycodone UR: NOT DETECTED
Specific Gravity, Urine: 1.021 (ref 1.003–1.030)
Tricyclic Antidepressant, UR: NOT DETECTED
pH, Urine: 6.4 (ref 5.0–9.0)

## 2021-09-10 LAB — LCOH URINE BETA-HCG: Beta Hcg: NEGATIVE

## 2021-09-10 LAB — FOLATE: Folic Acid: 14.9 NG/ML (ref >5.4)

## 2021-09-10 LAB — LCOH COMPLETE BLOOD COUNT W/ DIFF
Basophils Absolute: 0.1 10*3/uL (ref 0.0–0.3)
Basophils Relative: 1.1 % (ref 0.0–2.0)
Eosinophils Absolute: 0.2 10*3/uL (ref 0.0–0.5)
Eosinophils Relative: 2.5 % (ref 0.0–5.0)
Hematocrit: 30.7 % (ref 34.0–49.0)
Hemoglobin: 10 G/DL (ref 11.2–15.7)
Immature Granulocytes (%): 0.3 % (ref <1)
Immature Granulocytes Absolute: 0 10*3/uL (ref 0.0–0.1)
Lymphocytes Absolute: 2.9 10*3/uL (ref 0.9–4.1)
Lymphocytes Relative: 37.3 % (ref 14.0–51.0)
MCH: 24.9 PG (ref 26.0–34.0)
MCHC: 32.6 G/DL (ref 30.7–35.5)
MCV: 76.4 FL (ref 80.0–100.0)
MPV: 11.6 FL (ref 7.2–11.7)
Monocytes Absolute: 0.5 10*3/uL (ref 0.2–1.0)
Monocytes Relative: 6.1 % (ref 4.0–12.0)
Neutrophils Absolute: 4.2 10*3/uL (ref 1.8–7.5)
Neutrophils Relative: 52.7 % (ref 42.0–80.0)
Platelets: 318 10*3/uL (ref 140–400)
RBC: 4.02 M/uL (ref 3.95–5.26)
RDW: 15.4 % (ref <15.1)
WBC: 7.9 10*3/uL (ref 3.5–10.9)
nRBC: 0 /100 WBC

## 2021-09-10 LAB — COMPREHENSIVE METABOLIC PANEL
A/G Ratio: 1.9 RATIO (ref 0.8–2.6)
ALT: 18 U/L (ref 0–60)
AST: 20 U/L (ref 0–55)
Albumin: 3.7 G/DL (ref 3.5–5.2)
Alkaline Phosphatase: 116 U/L (ref 23–144)
BUN/Creatinine Ratio: 17 (ref 7–25)
BUN: 10 MG/DL (ref 3–29)
CO2: 28 MEQ/L (ref 19–32)
Calcium: 8.5 MG/DL (ref 8.5–10.5)
Chloride: 104 MEQ/L (ref 96–110)
Creatinine: 0.6 MG/DL (ref 0.5–1.2)
Globulin, Total: 1.9 G/DL (ref 1.9–3.6)
Glomerular Filtration Rate (GFR): 121 mL/min/{1.73_m2} (ref >60)
Glucose: 90 MG/DL (ref 70–99)
Potassium: 3.5 MEQ/L (ref 3.4–5.3)
Sodium: 142 MEQ/L (ref 135–148)
Total Bilirubin: 0.4 MG/DL (ref 0.0–1.2)
Total Protein: 5.6 G/DL (ref 6.0–8.3)

## 2021-09-10 LAB — URINALYSIS W/RFL TO MICROSCOPIC
Bilirubin, UA: NEGATIVE MG/DL
Blood, UA: NEGATIVE MG/DL
Glucose, UA: NEGATIVE MG/DL
Ketones, UA: NEGATIVE MG/DL
Leukocytes, UA: NEGATIVE
Nitrite, UA: NEGATIVE
Protein, UA: NEGATIVE MG/DL
Specific Gravity, UA: 1.017 (ref 1.005–1.030)
Urobilinogen, UA: <2 MG/DL (ref 0–2)
pH, UA: 6.5 (ref 4.5–8.0)

## 2021-09-10 LAB — HEMOGLOBIN A1C: Hemoglobin A1C: 5.8 % (ref 4.0–6.0)

## 2021-09-10 LAB — TSH: TSH: 1.03 MCIU/ML (ref 0.400–4.500)

## 2021-09-10 LAB — VITAMIN D 25 HYDROXY: Vit D, 25-Hydroxy: 16 NG/ML (ref 30–100)

## 2021-09-10 LAB — VITAMIN B12: Vitamin B-12: 463 PG/ML (ref 200–1100)

## 2021-09-10 MED ORDER — OXcarbazepine (TRILEPTAL) tablet 450 mg
150 | Freq: Two times a day (BID) | ORAL | Status: AC
Start: 2021-09-10 — End: 2021-09-13
  Administered 2021-09-10 – 2021-09-13 (×7): 450 mg via ORAL

## 2021-09-10 MED ORDER — cholecalciferol (vitamin D3) tablet 2,000 Units
1000 | Freq: Every day | ORAL | Status: AC
Start: 2021-09-10 — End: 2021-09-13

## 2021-09-10 MED ORDER — ergocalciferol capsule 50,000 Units
1250 | ORAL | Status: AC
Start: 2021-09-10 — End: 2021-09-13
  Administered 2021-09-11: 01:00:00 50000 [IU] via ORAL

## 2021-09-10 MED FILL — ERGOCALCIFEROL (VITAMIN D2) 1,250 MCG (50,000 UNIT) CAPSULE: 1250 1,250 mcg (50,000 unit) | ORAL | Qty: 1

## 2021-09-10 MED FILL — OXCARBAZEPINE 150 MG TABLET: 150 150 MG | ORAL | Qty: 1

## 2021-09-10 MED FILL — FLUOXETINE 20 MG CAPSULE: 20 20 MG | ORAL | Qty: 1

## 2021-09-10 MED FILL — HYDROCHLOROTHIAZIDE 25 MG TABLET: 25 25 MG | ORAL | Qty: 1

## 2021-09-10 MED FILL — OXCARBAZEPINE 300 MG TABLET: 300 300 MG | ORAL | Qty: 1

## 2021-09-10 NOTE — Unmapped (Signed)
Sarah [name] appeared to sleep 8.25 hours this night shift.  Q15 minute safety checks maintained throughout shift per unit protocol.

## 2021-09-10 NOTE — Unmapped (Signed)
Group Note    Patient Name: Sarah [name]    Date: 09/10/2021      Time: 0930 or 1700     Group Type: CBT    Group Name: Traffic Light Method    Group Objective: The zones are pretty straightforward: green indicates mild symptoms, where you???re floating by; orange indicates moderate symptoms, where you???re treading against the waves; and red is when the sufferer is in the whirlpool. The framework helps people identify and express what they???re experiencing. And, in addition to providing a tool to people who are struggling with feelings of depression, it can help advocates and loved ones know how to help someone they believe to be suffering.     Attendance: Did not attend    Interactions: Did not interact    Mood/Affect: Unable to assess    Patient Participation: [name] declined to attend group at this time. The materials were provided to the patient and staff will follow-up at a later time as necessary.    [name] MHS

## 2021-09-10 NOTE — Unmapped (Signed)
Problem: Ineffective Coping Skills  Description: Patient reporting having increased energy, impulsive behaviors to include shopping and driving to [facility]. She reported irritability at times and inability to sit still and focus.  Intervention: Intervention  Description: SW will communicate with the patient's boyfriend to provide updates on her progress and discharge plans. SW will input outpatient appointments for patient in their AVS and review with them.    Responsible staff: Mort Sawyers, MSW, LISW    Note: SW attempted to contact patient's boyfriend Josh. SW unable to reach Josh at this time but left a detailed VM.

## 2021-09-10 NOTE — Unmapped (Signed)
Group Note    Patient Name: Sarah ReinLisa Cheese    Date: 09/10/2021      Time: 9:00 AM     Group Type: MHS Group    Group Name: Community Group    Group Objective: To share daily goals and answer icebreaker questions about patients to create familiarity between patients.     Attendance: Did not attend, isolated in room.    Interactions: Not applicable    Mood/Affect: Not applicable    Patient Participation: N/A    Standley DakinsREY Adamariz Gillott

## 2021-09-10 NOTE — Unmapped (Signed)
The patient's vitamin D level is 16. Will start ergocalciferol to address acute deficiency, and cholecalciferol for ongoing supplementation.  PLAN:  Start ergocalciferol 50,000 units PO weekly for 4 doses  THEN cholecalciferol 2000 units PO daily

## 2021-09-10 NOTE — Unmapped (Signed)
Sarah [name] appeared to sleep 6.25 hours this night shift.  Q15 minute safety checks maintained throughout shift per unit protocol.

## 2021-09-10 NOTE — Unmapped (Signed)
Problem: Ineffective Coping Skills  Description: Patient reporting having increased energy, impulsive behaviors to include shopping and driving to [facility]. She reported irritability at times and inability to sit still and focus.  Intervention: Intervention  Description: SW will communicate with the patient's boyfriend to provide updates on her progress and discharge plans. SW will input outpatient appointments for patient in their AVS and review with them.    Responsible staff: Mort Sawyers, MSW, LISW    09/10/2021 1425 by Mort Sawyers, MSW, LISW  Note: SW received a phone call back from patient's boyfriend Josh. SW provided an update on the patient. Josh reported that he plans to visit the patient tomorrow.

## 2021-09-10 NOTE — Unmapped (Signed)
Crisis Management Plan    Remove all firearms, weapons (of any kind) or any unneeded medicines that could be used.  Identify a support person/advocate and attend follow-up mental health appointments with this person.  Be direct and talk openly about suicidal thoughts.  Allow expression of feelings.  Block all inappropriate internet websites and social media.  Get help from agencies that specialize in crisis intervention.  Create a personalized safety plan.   The following list are suicide prevention resources available 24 hours a day.    [facility] [place] [facility]: (513) 536-4673     [place]:    Crisis Hotline:     281-CARE (2273)     Psychiatric Emergency Services:  513-584-8577     Mobile Crisis Team:    513-584-5098    [place]:   [place] Consultation and Crisis 513-881-7180    [place]:    Emergency Crisis Hotline:   513-528-SAVE (7283)    [place]:   [place] Emergency Crisis Line:  859-331-3292    National:    National Hotline:    1-800-273-TALK(8255)     www.suicidepreventionlifeline.org    Local Crisis Hotline  877-695-6333  Crisis Text Line 741741  National #    988  [facility] 800-273-8255, select 1  [facility] (LGBTQ teens) 866-488-7386`

## 2021-09-10 NOTE — Unmapped (Signed)
Name: Marnette Perkins    Date: 09/10/2021    Time: 12:14 PM    Group Type: Recreation Therapy    Group Name: Mindfulness - Salt Art    Group Objective: Purpose of this group was mindfulness exercise and to discuss the idea of patience. Participants received spring designs to outline with glue. Then salt was poured over the glue and extra salt was dumped into the provided bin. Participants were then to use their watercolor kit to dab the paint on the salt and watch the color disperse.     Attendance: Did not attend    Interaction: Did not interact    Mood/Affect: Unable to assess    Participation: Pt was unable to attend RT group as a result of meeting with a member of treatment team. A handout on the benefits of engaging in healthy leisure activities was made available to Pt. Pt was made aware of an opportunity to ask questions related to group material.       Charls Custer Mayford Knife, CTRS
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
## 2021-09-10 NOTE — Unmapped (Signed)
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   Goal: Goal 1:STG/Objective  Description: Sarah [name] will participate in at least 2 groups daily by 09/12/2021.    Outcome: Progressing    She presents as cooperative with an responsive affect.  She rates anxiety 3/10 and depression 0/10.  She denies SI, HI and AVH currently.  She was observed in the open areas most of this shift, when not napping in her room.  She attended and engaged in no unit programs.  She ate 100% of dinner.  She was medication compliant.  PRNs:  none.       Sarah [name] is able to verbalize that they will remain safe at this time.  Based on the Treatment guidelines and the professional opinion of the RN and treatment team, they may have their own clothes, standard linens and all silverware with meals.  [place] Risk Level = low.    Daily Medication Education Questions:  1. Do you have questions about your medications? no  2.   Any questions about side effects and how to manage them? no  3.   Any questions on how you take your medications? no    Hadassah Paisoger L. RN / Designee     [facility]   Inpatient Shift Assessment    Name: Sarah [name]  MRN: [id number]  Admission date: 09/09/2021        Vitals:     Temp: 98.1 ??F (36.7 ??C)  Heart Rate: 66  Resp: 18  BP: 130/72  BP Location: Right arm  BP Method: Automatic  Patient Position: Sitting  SpO2: 98 %  O2 Device: None (Room air)  Height: 5' 7 (170.2 cm)  Weight: (!) 295 lb 9.6 oz (134.1 kg)  BMI (Calculated): 46.4      Pain/ Pain Reassessment:     Pain Score: 0 - No Pain         Intake:            Output:            POCT Glucose:            [place] Suicide Severity Rating Scale - Frequent Screener:     Have you actually had any thoughts of killing yourself since the last time you were asked?:  No  Have you done anything, started to do anything, or prepared to do anything to end your life?: No    Assigned Risk: Low Risk  Low Risk Interventions: 15 minute checks at irregular intervals;Reassess risk daily, upon status change and discharge;Pharmacological Treatment;Encourage attending DBT/CBT groups;Encourage to attend groups to learn coping skills, stress management, symptome management        Mental Status Exam:     Apparent Age: Appears Actual Age  Hygiene/Grooming: Clean  General Attitude: Cooperative  Motor Activity: Freedom of movement  Eye Contact: Appropriate  Facial Expression: Other (Comment) (calm)  Patient Behaviors: Cooperative;Calm  Impulsivity: Normal  Speech Pattern: Within Defined Limits  Mood: Within Defined Limits  Affect: Responsive  Affect congruent with mood: Yes  Content: Within Defined Limits  Delusions: Within Defined Limits  Perception: Appropriate  Hallucination: None  Thought content appropriate to situation: Yes  Danger to Others (WDL): Within Defined Limits  Thought process: Appropriate  Cognition: Follows 2-step commands;Appropriate safety awareness  Orientation Level: Oriented X4  Insight: Average  Judgement: Average;Poor           [place] Fall Risk     Age: Less than 50  Mental Status: Fully Alert/ Oriented at all times  Elimination: Independent with control of bowel/ bladder  Medication: Psychotropic medications ( including benzos and antidepressants)  Ambulation/ Balance: Independent/ Steady gait/ Immobile  Nutrition: No apparent abnormalities with appetite  Sleep Disturbance: No sleep disturbance  History of Falls: No history of falls  Secondary Diagnosis: Cardiac/ HTN/ Diabetic  [place] Fall Risk Score: 53             Patient Checks:     Interventions: Call bell within reach, Dayroom observation, ID band on  Visual Checks: Standard Q15  Arm Bands On: ID, Allergies  Patient Checked for Contraband: Belongings checked, Body checked, Clothing checked        Safety:      Depression rating: 0  Anxiety rating: 3  Self Injurious Thoughts: Denies  Self Injurious Behaviors: None observed  Thoughts of Harming Others: Denies  Current Thoughts of Aggression: No  Elopement risk?: No  Methods to Calm Down: Change of environment;Quiet time in room        DASA     Irritablity: No  Verbal Threats: No  Impulsivity: No  Negative attitude: No  Unwillingness to follow direction: No  Sensitivity to perceived provocation: No  Easily angered when request denied: No  Total Score - DASA: 0      Withdrawl Symptoms:            Hygiene:     Level of Assistance: Independent      Nutrition Screen:     Feeding: Able to feed self  Diet Type: Regular  Appetite: Good        Shashwat Cleary, RN  09/10/2021  9:30 PM

## 2021-09-10 NOTE — Unmapped (Signed)
Name: Sherian ReinLisa Farina    Date: 09/10/2021    Time: 2:25 PM    Group Type: Recreation Therapy    Group Name: Leisure Participation - Skip Bo    Group Objective: Purpose of this group was to learn a new leisure pursuit to engage in, increased socialization and sequencing.  Participants were taught the rules to a new card game and educated on where to purchase the game for self after discharge if interested.     Attendance: Attended    Interaction: Interacted appropriately     Mood/Affect: Appropriate    Participation: Pt participated in RT group. She was social and appropriate. Able to follow directions. Stated she had played the game a long time ago with family. Played the game without issue. Noted she felt it may have been a mistake to be admitted. Write spoke of the importance of sleep and lessening her bursts of energy. Agreed with Clinical research associatewriter. Completed group without issue.       Antara Brecheisen ArmonaWILLIAMS, CTRS

## 2021-09-10 NOTE — Unmapped (Signed)
Name: Sarah [name]    Date: 09/10/2021    Time: 2:00 pm     Group Type: Social Work     Group Name: Safety Plan     Group Objective: An effective safety plan will help clients understand their personal red flags that tell them they need to seek help. In less dire situations, coping skills might be enough. For the times when they need something more, this handout includes a space to list who they can reach out to (whether a friend, family member, or professional). This safety plan comes with several phone numbers for suicide hotlines in the [place], including one for the deaf and hard of hearing.    Attendance: Attended    Interaction: Interacted appropriately     Mood/Affect: Appropriate    Participation: Pt was calm and cooperative during group. She completed group without issue    [name]
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
## 2021-09-10 NOTE — Unmapped (Signed)
Internal Medicine - H&P Addendum    Ms. Osias's admission labs were not available at the time of H&P. I have reviewed her labs, which are within normal limits except for vitamin D.        Vitamin D deficiency  The patient's vitamin D level is 16. Will start ergocalciferol to address acute deficiency, and cholecalciferol for ongoing supplementation.  PLAN:  Start ergocalciferol 50,000 units PO weekly for 4 doses  THEN cholecalciferol 2000 units PO daily      Larwance RoteSteven Jerritt Cardoza, MD  09/10/2021 6:09 PM

## 2021-09-10 NOTE — Unmapped (Signed)
Group Treatment Note      Patient Name: Sarah [name]    Group Type: Nutrition    Date: 09/10/2021 Time: 1500    Nourish to Flourish    Meals and snacks encouraged to nourish self sufficiently. Discussed meal planning and shopping for groceries for meals and snacks.    Group objective:To understand the importance of meals and snacks to sufficiently nourish ourselves in our daily diet.    Attendance: Did not attend due to resting in their room.Nutrition information available on today's topic upon pt request.    Interactions: Did not interact    Mood/Affect: Unable to assess    Patient Participation: Did not attend so did not participate.    Aurea Graff Yareth Macdonnell,DTR

## 2021-09-10 NOTE — Unmapped (Signed)
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   Goal: Goal 1:STG/Objective  Description: Sarah [name] will participate in at least 2 groups daily by 09/12/2021.    Outcome: Progressing   Sarah [name] is pleasant, calm, cooperative.   She reports she slept great last night that the prn Klonopin last night helped her sleep well for the first time in weeks.  She denies anxiety, depression, SI/HI/AVH.  CSSR low risk.   Sarah [name] reports I feel great, but I've also been manic for last few weeks, so probably why I feel great.  Med compliant.  Meeting with clinicians this morning, joins RT group at 1100 and 1300.   Sarah [name] ate no breakfast and 100% of lunch.     [facility]   Inpatient Shift Assessment    Name: Sarah [name]  MRN: [id number]  Admission date: 09/09/2021        Vitals:     Temp: 98.1 ??F (36.7 ??C)  Heart Rate: 66  Resp: 18  BP: 130/72  BP Location: Right arm  BP Method: Automatic  Patient Position: Sitting  SpO2: 98 %  O2 Device: None (Room air)  Height: 5' 7 (170.2 cm)  Weight: (!) 295 lb 9.6 oz (134.1 kg)  BMI (Calculated): 46.4      Pain/ Pain Reassessment:               Intake:            Output:            POCT Glucose:            [place] Suicide Severity Rating Scale - Frequent Screener:     Have you actually had any thoughts of killing yourself since the last time you were asked?: No  Have you done anything, started to do anything, or prepared to do anything to end your life?: No    Assigned Risk: Low Risk  Low Risk Interventions: 15 minute checks at irregular intervals;Encourage to attend groups to learn coping skills, stress management, symptome management;Encourage attending DBT/CBT groups;Pharmacological Treatment;Reassess risk daily, upon status change and discharge        Mental Status Exam:      Apparent Age: Appears Actual Age  Hygiene/Grooming: Neat;Clean  General Attitude: Pleasant;Cooperative  Motor Activity: Unremarkable  Eye Contact: Appropriate  Facial Expression:  (calm)  Patient Behaviors: Calm;Cooperative  Impulsivity: Normal  Speech Pattern: Within Defined Limits  Mood: Within Defined Limits  Affect: Responsive  Affect congruent with mood: Yes  Content: Within Defined Limits  Delusions: Within Defined Limits  Perception: Appropriate  Hallucination: None  Thought content appropriate to situation: Yes  Danger to Others (WDL): Within Defined Limits  Cognition: Follows 2-step commands;Appropriate safety awareness  Orientation Level: Oriented X4  Insight: Average  Judgement: Average;Poor           Edmonson Fall Risk     Age: Less than 50  Mental Status: Fully Alert/ Oriented at all times  Elimination: Independent with control of bowel/ bladder  Medication: Psychotropic medications ( including benzos and antidepressants)  Ambulation/ Balance: Independent/ Steady gait/ Immobile  Nutrition: No apparent abnormalities with appetite  Sleep Disturbance: No sleep disturbance  History of Falls: No history of falls  Secondary Diagnosis: Cardiac/ HTN/ Diabetic  Edmonson Fall Risk Score: 53  Patient Checks:     Interventions: Dayroom observation  Visual Checks: Standard Q15  Arm Bands On: ID, Allergies  Patient Checked for Contraband: Belongings checked, Body checked, Clothing checked        Safety:     Depression rating: 0  Anxiety rating: 0  Self Injurious Thoughts: Denies  Self Injurious Behaviors: None observed  Thoughts of Harming Others: Denies  Do you have access to weapons in the home?: No  Current Thoughts of Aggression: No  Elopement risk?: No  Methods to Calm [name]: Change of environment;Talk with staff        DASA     Irritablity: No  Verbal Threats: No  Impulsivity: No  Negative attitude: No  Unwillingness to follow direction: No  Sensitivity to perceived provocation: No  Easily angered  when request denied: No  Total Score - DASA: 0      Withdrawl Symptoms:            Hygiene:     Level of Assistance: Independent      Nutrition Screen:     Feeding: Able to feed self  Diet Type: Regular  Appetite: Rudean Haskell, RN  09/10/2021  1:59 PM

## 2021-09-10 NOTE — Unmapped (Signed)
THERAPEUTIC RECREATION ASSESSMENT    Name: Sarah ReinLisa [name]  DOB: 01/20/1986  Attending Physician: Jennette BankerZachary Brian Pettibone,*  Admission Diagnosis: mania  Date: 09/10/2021       Stated Goals  Patient Stated Goal: stop the zoomies      Prior Function  Prior Function  Functional Mobility: Independent ( no assistive device)  Lives With: Family (mother and boyfriend, brother and his 2 children are in the home every other week)  Receives Help From: Family, Friend(s), Other (Comment) (mother, boyfriend, friends, OP: Visual merchandiserTherapist and Psychiatrist)  ADL Assistance: Independent  Homemaking/IADL Assistance: Independent  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 Hx of trauma. PMH includes anxiety, bipolar and fatigue.      Vision  Current Vision: Wears glasses all the time    Hearing  Hearing: No Deficits    Cognition  Orientation Level: Oriented X4    Social Domain  Overall Emotional Status: Impaired  Emotional Barriers: Agitated/Restless, Anxious, Loss of control, Impulsive (mania (bursts of energy, hyper sexual), paranoid delusions (mother and brother plotting against her))    Community Domain  Overall Community Domain Status: Within Functional Limits    Leisure Domain  Overall Leisure Domain Status: Impaired  Leisure Barriers: Withdrawn from positive leisure activities (mania, decreased participation)  Leisure Interest: reading/writing, craft/arts, exercise/sports, other (comment) (read, scroll on her phone)    Pt cooperative during RT assessment. Stated she began feeling more anxious after fighting with her brother. The excessive anxiety and stress around her brother caused symptoms to worsen. Began paranoia of mother and brother plotting against her. Noted decreased sleep the last few weeks. Another stressor being her brother and his 2 children living in their home every other week. Stated her recent emotions don't make sense to me. Works at [employer]. Able to list minimal leisure but stated  recently she has been scrolling my phone. Would benefit from RT groups to gain positive/healthy coping skills through leisure and improve leisure participation.

## 2021-09-11 MED ORDER — ARIPiprazole (ABILIFY) tablet 2 mg
2 | Freq: Every day | ORAL | Status: AC
Start: 2021-09-11 — End: 2021-09-13
  Administered 2021-09-11 – 2021-09-13 (×3): 2 mg via ORAL

## 2021-09-11 MED FILL — TYLENOL 325 MG TABLET: 325 325 mg | ORAL | Qty: 2

## 2021-09-11 MED FILL — OXCARBAZEPINE 150 MG TABLET: 150 150 MG | ORAL | Qty: 1

## 2021-09-11 MED FILL — ARIPIPRAZOLE 2 MG TABLET: 2 2 MG | ORAL | Qty: 1

## 2021-09-11 MED FILL — OLANZAPINE 5 MG DISINTEGRATING TABLET: 5 5 MG | ORAL | Qty: 1

## 2021-09-11 MED FILL — HYDROCHLOROTHIAZIDE 25 MG TABLET: 25 25 MG | ORAL | Qty: 1

## 2021-09-11 MED FILL — TRAZODONE 50 MG TABLET: 50 50 MG | ORAL | Qty: 1

## 2021-09-11 MED FILL — FLUOXETINE 20 MG CAPSULE: 20 20 MG | ORAL | Qty: 1

## 2021-09-11 NOTE — Unmapped (Signed)
Problem: Ineffective Coping Skills  Description: Patient needs coping skills for symptoms of mania to stop the zoomies.  Goal: Goal  Description: Patient will identify and demonstrate 1-2 leisure skills to use as positive/healthy coping skills to help manage symptoms of mania while on the IP unit.   Outcome: Not Progressing  Goal: Objective  Description: Patient will attend at least one RT group per day to identify and appropriately demonstrate leisure coping skills to address symptoms of mania.   Outcome: Progressing  Note: POC reviewed. RT assessment completed. Attended one RT groups since admission. Will encourage participation in RT groups to work toward her goal.

## 2021-09-11 NOTE — Unmapped (Signed)
Group Note    Patient Name: Sarah [name]    Date: 09/11/2021      Time: 1500     Group Type: Enrichment    Group Name: Healthy Ways to Process Rejection    Group Objective: Group members will provide a definition of rejection; shall be encouraged to describe what they feel when they are rejected; shall be encouraged to describe how they behave in response to rejection; shall discuss 5 possible strategies for processing their experience of rejection; shall be able to articulate which strategies they will use to process feelings of rejection.      Attendance: Attended    Interactions: Interacted appropriately    Mood/Affect: Appropriate    Patient Participation: Patient listened attentively and participated in group discussion. Patient received and reviewed handout.      Janeece FittingLaura Hartlyn Reigel, MA, [facility]

## 2021-09-11 NOTE — Unmapped (Signed)
Group Note    Patient Name: Sarah ReinLisa Dornbush    Date: 09/11/2021      Time: 0930     Group Type: CBT    Group Name: CBT House    Group Objective: Create a list of behaviors that you are trying to gain control of or areas of your life you want to change. Developing lists of emotions you want to experience more often and things you are happy about or want to feel about. Identifying one's foundation, support systems and experiences you are proud of.      Attendance: Attended    Interactions: Interacted appropriately    Mood/Affect: Appropriate    Patient Participation: Patient actively attended and participated in group. Patient engaged in discussion about group topics. Patient shared their examples with the group.     Prather Failla, MHS

## 2021-09-11 NOTE — Unmapped (Signed)
Problem: Ineffective Coping Skills  Description: Patient reporting having increased energy, impulsive behaviors to include shopping and driving to [facility]. She reported irritability at times and inability to sit still and focus.  Intervention: Intervention  Description: SW will communicate with the patient's boyfriend to provide updates on her progress and discharge plans. SW will input outpatient appointments for patient in their AVS and review with them.    Responsible staff: Mort Sawyers, MSW, LISW    Note: SW spoke with PPS to confirm patient's next appointments with current providers. SW also left a VM for both providers informing them of the reason for her admission.

## 2021-09-11 NOTE — Unmapped (Signed)
Name: Sarah [name]    Date: 09/11/2021    Time: 2:56 PM    Group Type: Recreation Therapy    Group Name: Relaxation - [place] Window Clings    Group Objective: Purpose of this group was to allow for participants to express their creativity and produce relaxation while making window clings.   Discussions during group centered on how participants felt while engaging in a new leisure pursuit.      Attendance: Did not attend    Interaction: Did not interact    Mood/Affect: Unable to assess    Participation: Pt was sleeping in room, and declined to attend group upon being awaken by staff. A handout on the benefits of engaging in healthy leisure activities was made available to Pt. Pt was made aware of an opportunity to ask questions related to group material.       Lyon Dumont Mayford Knife, CTRS

## 2021-09-11 NOTE — Unmapped (Signed)
[facility]  Inpatient Progress Note    Name: Sarah [name]  MRN: [id number]  Total Duration: 40 minutes  Psychotherapy Add-On Duration: n/a    Interval History:  Chart reviewed. Discussed with treatment team. Charted to have slept 8.25 hours. Slept majority of the day yesterday. Pt reports increased energy and restlessness today. States something is wrong with me and I don't know what. Reports feeling like there are a million suns in my chest that are about to explode. Appears restless, fidgeting, and agitated upon assessment. Becomes tearful and anxious when describing symptoms throughout assessment. Discussed practicing coping skills and grounding techniques during increased sx of anxiety/panic. Eating well- charted to have eaten 100% of dinner yesterday evening. Spoke with boyfriend yesterday and he remains a good support system. Tolerating medication well. Denies SE. Discussed plan to start Abilify 2mg  for mood stabilization- pt was agreeable to plan. Reports hx of hypoglycemia- RN performed POCT glucose check this AM and reported results 94. Pt requesting to discuss meal options with nutrition team. Denies SI/HI/AVH and contracts for safety.     Physical Review Of Systems:  Review of Systems   Constitutional: Negative for fever.   Cardiovascular: Negative for chest pain and palpitations.   Gastrointestinal: Negative for abdominal pain, constipation, diarrhea, heartburn, nausea and vomiting.   Musculoskeletal: Positive for back pain.   Skin: Negative for rash.   Neurological: Negative for dizziness and headaches.         Psychiatric Review Of Systems:  Sleep: good  Interest: decreased  Anhedonia: increased  Appetite Changes: unaffected  Weight Changes: No change  Energy: increased  Libido: increased  Anxiety/Panic:  increased  Guilt: absent   Hopeless: absent  S.I.B.s/risky behavior:  absent    Objective:  Vital signs in last 24 hours:  Temp:  [98.1 ??F (36.7 ??C)] 98.1 ??F (36.7 ??C)  Heart Rate:  [72]  72  Resp:  [18] 18  BP: (138)/(78) 138/78    Labs:  Available labs reviewed    Latest Reference Range & Units 09/10/21 07:00   Protein, Total 6.0 - 8.3 G/DL 5.6 (L)   (L): Data is abnormally low   Latest Reference Range & Units 09/10/21 07:00   Triglycerides, Serum <150 MG/DL 540 (H)   (H): Data is abnormally high   Latest Reference Range & Units 09/10/21 07:00   Total VLDL-C Direct 4 - 30 MG/DL 39 (H)   (H): Data is abnormally high   Latest Reference Range & Units 09/10/21 07:00   Vit D, 25-Hydroxy 30 - 100 NG/ML 16 (L)   (L): Data is abnormally low   Latest Reference Range & Units 09/10/21 07:00   Hemoglobin 11.2 - 15.7 G/DL 98.1 (L)   Hematocrit 19.1 - 49.0 % 30.7 (L)   MCV 80.0 - 100.0 FL 76.4 (L)   MCH 26.0 - 34.0 PG 24.9 (L)   MCHC 30.7 - 35.5 G/DL 47.8   RDW <29.5 % 62.1 (H)   (L): Data is abnormally low  (H): Data is abnormally high    Scheduled Meds:  ??? ARIPiprazole  2 mg Oral Daily 0900   ??? ergocalciferol  50,000 Units Oral Weekly    Followed by   ??? [START ON 10/08/2021] cholecalciferol (vitamin D3)  2,000 Units Oral Daily 0900   ??? FLUoxetine  20 mg Oral Daily 0900   ??? hydroCHLOROthiazide  25 mg Oral Daily 0900   ??? OXcarbazepine  450 mg Oral BID     PRN Meds:.acetaminophen, clonazePAM,  OLANZapine **AND** [EXPIRED] sterile water, olanzapine zydis, polyethylene glycol, propranoloL, traZODone    Mental Status Exam:  General   Development: Normal  Grooming/ Hygiene: Appropriately dressed, Clean  Demeanor: Polite, Cooperative  Eye Contact: Appropriate  Speech  Rate: Fast  Volume: Normal  Articulation: Normal  Quality: Normal  Motor  Atrophy: None  Abnormal movements: None  Station: Normal  Gait: Normal  Mood/ Affect  Mood: Anxious, Depressed  Range: Constricted  Reactivity: Labile  Appropriateness: Appropriate to mood and/ or situation  Thought   Content: Anxious ruminations  Process: Racing  Associations: Normal  Physical and Psychological Reality Test: Normal, Hallucinations - denied  Cognitive  Level of  Alertness: Normal  Orientation: Oriented to all spheres  Short term memory: Intact  As evidenced by: Recall of medication taken this morning  Long term memory: Intact  As evidenced by: other  Attention/ Concentration/ Focus: Intact  Language: Intact  Intellect: Average  As evidenced by: [employer] of knowledge: Intact  Safety  Harm to self: No  Risk of Harm to self: Low  Harm to others: No  Risk of Harm to others: Low  Insight/ Judgement  Insight:  (fair)  Judgment: Fair      Diagnoses:  Bipolar I disorder, manic, moderate  GAD  ??  Problem Based Assessment and Management Plan:   Patient admitted voluntarily for increased mood fluctuations and anxiety. Recent stressors include argument with brother and stressful living environment. Pt reports over the past few weeks symptoms have been worsening- reports bursts of energy, feels like entire body is going to explode, increased irritability, increase in goal directed behaviors, increase in impulsivity including spending money and buying things she does not need, and decreased sleep. Reports increase in sx of anxiety including excessive worrying, restlessness, and racing thoughts.  ??  Bipolar I disorder/GAD  -increase Trileptal to 450mg  BID; outside provider recommended dose change, but pt has been forgetting to take increased dose  -continue Prozac 20mg   -initiate Abilify 2mg  on 4/19 for mood stabilization  -olanzapine 5mg  PRN for agitation/restlessness  -plan to discuss medication trials/history with outpatient provider if possible  -klonopin and propranolol PRN for anxiety; trazodone PRN for sleep  -milieu and group therapies  -Collaborate with social work for collateral information and discharge planning  ??  Medical:  -Internal medicine consulted for admission H&P. See note from 4/18 for details.  -available labs reviewed; initiated vitamin d for low levels  -continue home hydrochlorothiazide 25mg   ??  Safety  -Monitor for SI, SIB  -Continue staggered  observational checks  -Privilege level: off unit with supervision. Pt has demonstrated the ability to be safe in the hospital. No dangerous or inappropriate behaviors have been observed since admission. Pt has been calm and compliant with care. Pt has agreed to stay safe in the hospital and to notify staff if feeling unsafe.    ??  Discharge pending patient safety, symptom improvement, and medication tolerability. Risks, benefits, side effects, and alternatives to the above plan were discussed with patient.??The patient voiced understanding and agreement with the treatment plan  ??    Tatem Fesler, CNP  09/11/2021

## 2021-09-11 NOTE — Unmapped (Cosign Needed)
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   Goal: Goal 1:STG/Objective  Description: [name] will participate in at least 2 groups daily by 09/18/2021.    Intervention: Intervention B  Description: Unit RN will encourage [name] to identify a daily goal to accomplish   Responsible staff: Antony Salmon, RN/ Designee   Note: Pt presents in WDL mood with responsive affect. She rates anxiety 2/10 and depression 0/10. She denies SI/HI/AVH and is able to come to staff if feeling unsafe.She was visible resting in her room for a majority of the evening. Pt attended/engaged in movement group this shift. She ate 90% of dinner. Pt was pleasant and cooperative with staff. She had a visit from her mother and pt reported it went well. Pt stated something she is struggling with is my brain doesn't feel right. I can't think and everything is going super fast.      [facility]   Inpatient Shift Assessment    Name: Sarah [name]  MRN: [id number]  Admission date: 09/09/2021        Vitals:     Temp: 98.1 ??F (36.7 ??C)  Temp Source: Oral  Heart Rate: 72  Resp: 18  BP: 138/78  BP Location: Right arm  BP Method: Automatic  Patient Position: Sitting  SpO2: 100 %  O2 Device: None (Room air)  Height: 5' 7 (170.2 cm)  Weight: (!) 295 lb 9.6 oz (134.1 kg)  BMI (Calculated): 46.4      Pain/ Pain Reassessment:               Intake:            Output:            POCT Glucose:            [place] Suicide Severity Rating Scale - Frequent Screener:                   Mental Status Exam:     Apparent Age: Appears Actual Age  Hygiene/Grooming: Clean;Neat  General Attitude: Cooperative;Pleasant  Motor Activity: Freedom of movement  Eye Contact: Appropriate  Facial Expression: Animated  Patient Behaviors: Appropriate for  situation  Impulsivity: Normal  Speech Pattern: Within Defined Limits  Mood: Within Defined Limits  Affect: Responsive  Affect congruent with mood: Yes  Content: Within Defined Limits  Delusions: Within Defined Limits  Perception: Appropriate  Hallucination: None  Thought content appropriate to situation: Yes  Danger to Others (WDL): Within Defined Limits  Thought process: Appropriate  Cognition: Follows 2-step commands  Orientation Level: Oriented X4  Insight: Average  Judgement: Average  Appetite Change: Normal for patient        Edmonson Fall Risk     Age: Less than 50  Mental Status: Fully Alert/ Oriented at all times  Elimination: Independent with control of bowel/ bladder  Medication: Psychotropic medications ( including benzos and antidepressants)  Ambulation/ Balance: Independent/ Steady gait/ Immobile  Nutrition: No apparent abnormalities with appetite  Sleep Disturbance: No sleep disturbance  History of Falls: No history of falls  Secondary Diagnosis: Cardiac/ HTN/ Diabetic  Edmonson Fall Risk Score: 56             Patient Checks:     Interventions: Call bell within reach, ID  band on  Visual Checks: Standard Q15  Arm Bands On: ID, Allergies  Patient Checked for Contraband: Belongings checked, Body checked, Clothing checked        Safety:     Depression rating: 2  Anxiety rating: 0  Self Injurious Thoughts: Denies  Self Injurious Behaviors: None observed  Thoughts of Harming Others: Denies  Current Thoughts of Aggression: No  Elopement risk?: No  Methods to Calm Down: Quiet time in room;Change of environment        DASA     Irritablity: No  Verbal Threats: No  Impulsivity: No  Negative attitude: No  Unwillingness to follow direction: No  Sensitivity to perceived provocation: No  Easily angered when request denied: No  Total Score - DASA: 0      Withdrawl Symptoms:            Hygiene:     Level of Assistance: Independent      Nutrition Screen:     Feeding: Able to feed self  Diet Type: Regular  Appetite:  Good        Theodoros Stjames  09/11/2021  9:15 PM

## 2021-09-11 NOTE — Unmapped (Addendum)
Nutrition note    Provided pt with written list of carbohydrate with protein snack ideas that are available from the floor stock and refrigerator. Encouraged pt to order carbohydrate containing foods with  protein foods that she has been ordering for her meals. Encouraged pt to order extra item on her tray that does not need to be refrigerated so she can save it for a snack and to help prevent hypoglycemia.   Encouraged pt to schedule appointment with her outpt registered dietitian to assist with her meals and snacks since pt has a hx of gastric bypass.  Pt very thankful for information provided.   Sarah [name],DTR

## 2021-09-11 NOTE — Unmapped (Signed)
Name: Sarah ReinLisa Bremer    Date: 09/11/2021    Time: 2:00 pm     Group Type: Social Work    Group Name: Habits    Group Objective: A habit plan involves connecting a new habit to an existing one, then rewarding the successful completion of the task. The Habit Plan worksheet provides instructions, examples, and a template for clients to create their own plan.    After clients fill out their plan, encourage them to keep it somewhere visible so they are reminded to work on the habit. Check in on their progress during each session, and make adjustments as necessary.    Attendance: Did not attend    Interaction: Did not interact    Mood/Affect: Unable to assess    Participation: Pt was not present for group due to disinterest in group mateiral, Child psychotherapistsocial worker provided handout to review.      Aprille Sawhney

## 2021-09-11 NOTE — Unmapped (Signed)
Group Note    Patient Name: Sarah [name]    Date: 09/11/2021      Time: 0900     Group Type: Goal Setting    Group Name: Community    Group Objective: This clinician assessed patients' physical and mental wellness via group discussion and review of daily check in document. Clinician also assessed patients' for presence of suicidal and/or homicidal ideation, plans or intentions and addressed patient concerns. Unit schedule, rules and regulation were reviewed. In addition, group discussed the importance of hygiene as needed.      Attendance: Attended    Interactions: Interacted appropriately    Mood/Affect: Appropriate    Patient Participation: Pt was active and present in community goals group. They engaged in self-reflection and completed check-in worksheet. Pt shared amongst group members and/or listened to others. Pt created a SMART goal during group session.    Lizabeth Fellner MHS

## 2021-09-11 NOTE — Unmapped (Addendum)
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   Goal: Goal 1:STG/Objective  Description: Sarah [name] will participate in at least 2 groups daily by 09/18/2021.    Outcome: Progressing   Sarah [name] is pleasant, calm, cooperative.  Visible in open area throughout shift.   She denies anxiety and depression.  Denies SI/HI/AVH.  CSSR low risk.  Med compliant.  PRN Tylenol for back pain r/t SI joint at 0830.   Sarah [name] reports I've had so much sleep.   She feels like she's improved over last few days.  Sarah [name] reports being self-aware about her mania.  She reports that on Monday she felt like she was running zoomies, but today jittery.   Sarah [name] reports that her dad died in 08/03/20, and she's been sad since then, and not sure if she's feeling better or more manic.  In her Community group worksheet, Sarah [name] writes that she feels numb yet vibrant r/t mania.    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  Accu-check = 92.  RN gives peanut butter and crackers, as Sarah [name] worried about needing protein.   Continued restlessness, somewhat elevated.  PRN zyprexa 5mg  at 1050.  MHS assists Sarah [name] to use ice bucket for calming, which is helpful.  Sarah [name] reports later that she feels that the meds and ice helped her, but still feels somewhat jittery.   She attended no groups.  Outside on unit courtyard at times.  Napping from 1300-1400.  Husband visits at 1400. Ate 100% of breakfast and lunch.      [facility]   Inpatient Shift Assessment    Name: Sarah [name]  MRN: [id number]  Admission date: 09/09/2021        Vitals:     Temp: 98.1 ??F (36.7 ??C)  Temp Source: Oral  Heart Rate: 72  Resp: 18  BP: 138/78  BP Location: Right arm  BP Method: Automatic  Patient  Position: Sitting  SpO2: 100 %  O2 Device: None (Room air)  Height: 5' 7 (170.2 cm)  Weight: (!) 295 lb 9.6 oz (134.1 kg)  BMI (Calculated): 46.4      Pain/ Pain Reassessment:               Intake:            Output:            POCT Glucose:     94      [place] Suicide Severity Rating Scale - Frequent Screener:     Have you actually had any thoughts of killing yourself since the last time you were asked?: No  Have you done anything, started to do anything, or prepared to do anything to end your life?: No    Assigned Risk: Low Risk  Low Risk Interventions: 15 minute checks at irregular intervals;Encourage to attend groups to learn coping skills, stress management, symptome management;Encourage attending DBT/CBT groups;Pharmacological Treatment;Reassess risk daily, upon status change and discharge        Mental Status Exam:     Apparent Age: Appears Actual Age  Hygiene/Grooming: Neat;Clean  General Attitude: Pleasant;Cooperative  Motor Activity: Unremarkable  Eye Contact: Appropriate  Facial Expression: Animated  Patient Behaviors: Cooperative;Anxious  Impulsivity:  Normal  Speech Pattern: Within Defined Limits  Mood: Anxious;Elated  Affect: Responsive  Affect congruent with mood: Yes  Delusions: Within Defined Limits  Perception: Appropriate  Hallucination: None  Thought content appropriate to situation: Yes  Danger to Others (WDL): Within Defined Limits  Thought process: Goals directed  Cognition: Follows 2-step commands;Appropriate safety awareness  Orientation Level: Oriented X4  Attention Span: Easily distracted  Insight: Average  Judgement: Poor           Edmonson Fall Risk     Age: Less than 50  Mental Status: Fully Alert/ Oriented at all times  Elimination: Independent with control of bowel/ bladder  Medication: Psychotropic medications ( including benzos and antidepressants)  Ambulation/ Balance: Independent/ Steady gait/ Immobile  Nutrition: No apparent abnormalities with appetite  Sleep Disturbance: No  sleep disturbance  History of Falls: No history of falls  Secondary Diagnosis: Cardiac/ HTN/ Diabetic  Edmonson Fall Risk Score: 53             Patient Checks:     Interventions: Dayroom observation  Visual Checks: Standard Q15  Arm Bands On: ID, Allergies  Patient Checked for Contraband: Belongings checked, Body checked, Clothing checked        Safety:     Depression rating: 2  Anxiety rating: 0  Self Injurious Thoughts: Denies  Self Injurious Behaviors: None observed  Thoughts of Harming Others: Denies  Current Thoughts of Aggression: No  Elopement risk?: No  Methods to Calm Down: Change of environment;PRN medications;Quiet time in room;Talk with staff        DASA     Irritablity: No  Verbal Threats: No  Impulsivity: No  Negative attitude: No  Unwillingness to follow direction: No  Sensitivity to perceived provocation: No  Easily angered when request denied: No  Total Score - DASA: 0      Withdrawl Symptoms:            Hygiene:     Level of Assistance: Independent      Nutrition Screen:     Feeding: Able to feed self  Diet Type: Regular  Appetite: Sarah HaskellGood        Sarah Deller, RN  09/11/2021  1:42 PM

## 2021-09-11 NOTE — Unmapped (Signed)
Name: Sarah [name]    Date: 09/11/2021    Time: 11:49 AM    Group Type: Recreation Therapy    Group Name: Relaxation - Mandala Window Clings    Group Objective: Purpose of this group was to allow for participants to express their creativity and produce relaxation while making window clings.   Discussions during group centered on how participants felt while engaging in a new leisure pursuit.      Attendance: Attended    Interaction: Interacted appropriately     Mood/Affect: Appropriate    Participation: Pt participated in RT group after meeting with MHS. She then made a phone call before arriving to group. Able to follow directions and began her mandala.      Kecia Swoboda [place], CTRS

## 2021-09-11 NOTE — Unmapped (Signed)
Group Note    Patient Name: Sarah [name]    Date: 09/11/2021     Time: 1930     Group Type: Process    Group Name: Wrap-Up     Group Objective: To process the patient's day/evening, allow patient's to voice any questions/concerns, and reflect upon emotions to evaluate the progression. Patients are encouraged to reflect on their day and share positive experiences.       Attendance: Did not attend    Interactions: Did not interact    Mood/Affect: Unable to assess    Patient Participation: Pt was unable to attend group due to having a visitor. Group materials made available and MHS able to review information with pt 1:1 if necessary.        Peggye Pitt, MHS

## 2021-09-11 NOTE — Unmapped (Signed)
Group Note    Patient Name: Sarah ReinLisa [name]    Date: 09/11/2021      Time: 1630     Group Type: DBT- Distress Tolerance    Group Name: ACCEPTS, Self-Soothe, IMPROVE    Group Objective: Taught patients distress tolerance skills of tolerating painful events and emotions but utilizing distracting with ACCEPTS (Activities, Contributing, Comparisons, (different) Emotions, Pushing away, Thoughts, and Sensations), IMPROVE the moment (Imagery, Meaning, Prayer/Practice, Relaxing actions, One thing in the moment, brief Vacation, and Self Encouragement and rethinking the situation), and Self-Soothe via the 5 senses(taste, touch, smell, vision, and hearing). Engaged patients' in practicing distraction techniques through playing a card game.     Attendance: Did not attend    Interactions: Did not interact    Mood/Affect: Unable to assess    Patient Participation: Pt was encouraged to attend group but declined due to resting in her room at the time of group. Group materials made available and MHS able to review information with pt 1:1 if necessary.        Peggye PittHALEY Brittley Regner, MHS

## 2021-09-12 MED ORDER — ibuprofen (MOTRIN) tablet 400 mg
400 | Freq: Four times a day (QID) | ORAL | Status: AC | PRN
Start: 2021-09-12 — End: 2021-09-13
  Administered 2021-09-12 – 2021-09-13 (×2): 400 mg via ORAL

## 2021-09-12 MED FILL — HYDROCHLOROTHIAZIDE 25 MG TABLET: 25 25 MG | ORAL | Qty: 1

## 2021-09-12 MED FILL — TYLENOL 325 MG TABLET: 325 325 mg | ORAL | Qty: 2

## 2021-09-12 MED FILL — OXCARBAZEPINE 150 MG TABLET: 150 150 MG | ORAL | Qty: 1

## 2021-09-12 MED FILL — FLUOXETINE 20 MG CAPSULE: 20 20 MG | ORAL | Qty: 1

## 2021-09-12 MED FILL — ARIPIPRAZOLE 2 MG TABLET: 2 2 MG | ORAL | Qty: 1

## 2021-09-12 MED FILL — OLANZAPINE 5 MG DISINTEGRATING TABLET: 5 5 MG | ORAL | Qty: 1

## 2021-09-12 MED FILL — IBUPROFEN 400 MG TABLET: 400 400 MG | ORAL | Qty: 1

## 2021-09-12 NOTE — Unmapped (Signed)
Sarah [name] appeared to sleep 8.5 hours this night shift.  Q15 minute safety checks maintained throughout shift per unit protocol.

## 2021-09-12 NOTE — Unmapped (Signed)
[facility]  Inpatient Progress Note    Name: Sarah [name]  MRN: [id number]  Total Duration: 40 minutes  Psychotherapy Add-On Duration: n/a    I spent 40 minutes total minutes on the encounter with the patient. This time was spent: speaking with the patient, conducting an interview, performing a medically appropriate exam, educating the patient on my assessment and plan, and preparing to see the patient (eg, review of tests), obtaining and/or reviewing separately obtained history, documenting clinical information in the electronic or other health record, providing care coordination  and performing non-face-to-face activities.    Interval History:  Chart reviewed. Discussed with treatment team. Charted to have slept 8.5 hours. Continues to report increased energy, restlessness, and agitation today. Becomes tearful throughout assessment when discussing symptoms. Continues to state there is something wrong with me and I do not know what. States she feels more irritable today. Feels frustrated with the meal options she is being offered, states she feels as if she is gaining weight, and expresses concern that her weight loss progress she had made prior to admission is being ruined- states her concerns are causing her to not want to eat. Met with dietitian team yesterday and also expressed concerns to them. Currently rates her depression and anxiety 2/10, but states when she thinks about weight gain and nutrition her anxiety is 8/10. States she feels as if she is not herself, cannot think clearly, and concerned about returning to work in current state. Discussed practicing coping skills during times of heightened anxiety and utilizing PRN medication. Pt was agreeable to plan. Boyfriend visited yesterday and reports they had a positive visit. Reports symptoms improve throughout the day and recognizes increased anxiety/irritability in the AM. Took a nap yesterday afternoon. Attending some groups and finds them  helpful for treatment progression. Denies SI/HI/AVH and contracts for safety. Tolerating medication well. Denies SE.     Physical Review Of Systems:  Review of Systems   Constitutional: Negative for fever.   Cardiovascular: Negative for chest pain and palpitations.   Gastrointestinal: Negative for abdominal pain, constipation, diarrhea, heartburn, nausea and vomiting.   Musculoskeletal: Positive for back pain.   Skin: Negative for rash.   Neurological: Negative for dizziness and headaches.         Psychiatric Review Of Systems:  Sleep: good  Interest: decreased  Anhedonia: increased  Appetite Changes: unaffected  Weight Changes: No change  Energy: increased  Libido: increased  Anxiety/Panic:  increased  Guilt: absent   Hopeless: absent  S.I.B.s/risky behavior:  absent    Objective:  Vital signs in last 24 hours:  Heart Rate:  [78] 78  Resp:  [18] 18  BP: (128)/(74) 128/74    Labs:  Available labs reviewed    Latest Reference Range & Units 09/10/21 07:00   Protein, Total 6.0 - 8.3 G/DL 5.6 (L)   (L): Data is abnormally low   Latest Reference Range & Units 09/10/21 07:00   Triglycerides, Serum <150 MG/DL 540 (H)   (H): Data is abnormally high   Latest Reference Range & Units 09/10/21 07:00   Total VLDL-C Direct 4 - 30 MG/DL 39 (H)   (H): Data is abnormally high   Latest Reference Range & Units 09/10/21 07:00   Vit D, 25-Hydroxy 30 - 100 NG/ML 16 (L)   (L): Data is abnormally low   Latest Reference Range & Units 09/10/21 07:00   Hemoglobin 11.2 - 15.7 G/DL 98.1 (L)   Hematocrit 19.1 - 49.0 %  30.7 (L)   MCV 80.0 - 100.0 FL 76.4 (L)   MCH 26.0 - 34.0 PG 24.9 (L)   MCHC 30.7 - 35.5 G/DL 16.1   RDW <09.6 % 04.5 (H)   (L): Data is abnormally low  (H): Data is abnormally high    Scheduled Meds:  ??? ARIPiprazole  2 mg Oral Daily 0900   ??? ergocalciferol  50,000 Units Oral Weekly    Followed by   ??? [START ON 10/08/2021] cholecalciferol (vitamin D3)  2,000 Units Oral Daily 0900   ??? FLUoxetine  20 mg Oral Daily 0900   ???  hydroCHLOROthiazide  25 mg Oral Daily 0900   ??? OXcarbazepine  450 mg Oral BID     PRN Meds:.acetaminophen, clonazePAM, ibuprofen, OLANZapine **AND** [EXPIRED] sterile water, olanzapine zydis, polyethylene glycol, propranoloL, traZODone    Mental Status Exam:  General   Development: Normal  Grooming/ Hygiene: Appropriately dressed, Clean  Demeanor: Polite, Cooperative  Eye Contact: Appropriate  Speech  Rate: Fast  Volume: Normal  Articulation: Normal  Quality: Normal  Motor  Atrophy: None  Abnormal movements: None  Station: Normal  Gait: Normal  Mood/ Affect  Mood: Depressed, Anxious  Range: Constricted  Reactivity: Labile  Appropriateness: Appropriate to mood and/ or situation  Thought   Content: Anxious ruminations  Process: Racing  Associations: Normal  Physical and Psychological Reality Test: Normal, Hallucinations - denied  Cognitive  Level of Alertness: Normal  Orientation: Oriented to all spheres  Short term memory: Intact  As evidenced by: Recall of medication taken this morning  Long term memory: Intact  As evidenced by: other  Attention/ Concentration/ Focus: Intact  Language: Intact  Intellect: Average  As evidenced by: [employer] of knowledge: Intact  Safety  Harm to self: No  Risk of Harm to self: Low  Harm to others: No  Risk of Harm to others: Low  Insight/ Judgement  Insight:  (fair)  Judgment: Fair      Diagnoses:  Bipolar I disorder, manic, moderate  GAD  ??  Problem Based Assessment and Management Plan:   Patient admitted voluntarily for increased mood fluctuations and anxiety. Recent stressors include argument with brother and stressful living environment. Pt reports over the past few weeks symptoms have been worsening- reports bursts of energy, feels like entire body is going to explode, increased irritability, increase in goal directed behaviors, increase in impulsivity including spending money and buying things she does not need, and decreased sleep. Reports increase in sx of anxiety  including excessive worrying, restlessness, and racing thoughts.  ??  Bipolar I disorder/GAD  -increase Trileptal to 450mg  BID on 4/18; outside provider recommended dose change, but pt has been forgetting to take increased dose  -continue Prozac 20mg   -initiate Abilify 2mg  on 4/19 for mood stabilization  -olanzapine 5mg  PRN for agitation/restlessness  -plan to discuss medication trials/history with outpatient provider if possible  -klonopin and propranolol PRN for anxiety; trazodone PRN for sleep  -milieu and group therapies  -Collaborate with social work for collateral information and discharge planning  ??  Medical:  -Internal medicine consulted for admission H&P. See note from 4/18 for details.  -available labs reviewed; initiated vitamin d for low levels  -continue home hydrochlorothiazide 25mg   ??  Safety  -Monitor for SI, SIB  -Continue staggered observational checks  -Privilege level: off unit with supervision. Pt has demonstrated the ability to be safe in the hospital. No dangerous or inappropriate behaviors have been observed since admission. Pt has been  calm and compliant with care. Pt has agreed to stay safe in the hospital and to notify staff if feeling unsafe.    ??  Discharge pending patient safety, symptom improvement, and medication tolerability. Risks, benefits, side effects, and alternatives to the above plan were discussed with patient.??The patient voiced understanding and agreement with the treatment plan  ??    Keyunna Coco, CNP  09/12/2021

## 2021-09-12 NOTE — Unmapped (Signed)
Group Note    Patient Name: Sarah [name]    Date: 09/12/2021      Time: 1930     Group Type: Process Group    Group Name: Wrap Up Group    Group Objective:  To process the days events and allow patients to share.     Attendance: Attended    Interactions: Interacted appropriately    Mood/Affect: Appropriate    Patient Participation: Sarah ReinLisa Thunder attended group and interacted appropriately. Sarah ReinLisa Collister rated anxiety 3/10 and depression 1/10 (10 being high). [name] Ingrum denies SI, HI, and AVH at this time.     Jaelin Fackler, MHS

## 2021-09-12 NOTE — Unmapped (Signed)
Name: Sarah [name]    Date: 09/12/2021    Time: 12:05 PM    Group Type: Recreation Therapy    Group Name: Ledora Bottcher in [employer]    Group Objective: Purpose of this group is to learn a new leisure pursuit, increase socialization and help provide a relaxing atmosphere for the participants in their daily routine.      Attendance: Attended    Interaction: Interacted appropriately     Mood/Affect: Blunted/flat    Participation: Pt began group but was excused to meet with provider. Returned to group after. Sat with Clinical research associate during the second hand to learn the game.       Nasirah Sachs [place], CTRS

## 2021-09-12 NOTE — Unmapped (Signed)
Problem: Ineffective Coping Skills  Description: Patient reporting having increased energy, impulsive behaviors to include shopping and driving to [facility]. She reported irritability at times and inability to sit still and focus.  Goal: Objective  Description: Patient will have an appointment within 7 days for individual therapy and 30 days for medication management to address anxiety.  Outcome: Progressing  Note: SW met with the patient to review her treatment plan. Patient reported that she is feeling better but is still having moments where she feels she is unable to control her emotions. She shared that she became agitated over getting the wrong yogurt this morning and says now that she feels stupid for feeling so annoyed about it. Patient shared that she didn't want to feel so all over the place going back to work and is worried how long that will take. She did mention wanting to go home tomorrow, stating that she felt ready but wants to make sure she is stable. Patient plans to follow up with [facility] for medication management and individual therapy.

## 2021-09-12 NOTE — Unmapped (Signed)
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   Goal: Goal 1:STG/Objective  Description: Sarah [name] will participate in at least 2 groups daily by 09/18/2021.    Outcome: Progressing   Sarah [name] is pleasant, calm, cooperative.  Visible in open area this morning.  She reports feeling tired, irritable which is new.   Reports some racing thoughts.  Sarah [name] rates anxiety 2/10 and depression 1/10.  Denies SI/HI/AVH.  CSSR low risk.  In [employer]	[place], Sarah [name] wrote that she feels irritalbe, low energy r/t change in meds.  Med compliant.   PRN Tylenol for back pain at 1000.   RN provided egg-crate mattress.  PRN motrin for head ache at 1415. Sarah [name] attended 3 groups. She ate 100% of breakfast and lunch.    [facility]   Inpatient Shift Assessment    Name: Sarah [name]  MRN: [id number]  Admission date: 09/09/2021        Vitals:     Temp: 98.1 ??F (36.7 ??C)  Temp Source: Oral  Heart Rate: 78  Resp: 18  BP: 128/74  BP Location: Left arm  BP Method: Automatic  Patient Position: Sitting  SpO2: 99 %  O2 Device: None (Room air)  Height: 5' 7 (170.2 cm)  Weight: (!) 295 lb 9.6 oz (134.1 kg)  BMI (Calculated): 46.4      Pain/ Pain Reassessment:               Intake:            Output:            POCT Glucose:            Sarah [name] Suicide Severity Rating Scale - Frequent Screener:     Have you actually had any thoughts of killing yourself since the last time you were asked?: No  Have you done anything, started to do anything, or prepared to do anything to end your life?: No    Assigned Risk: Low Risk  Low Risk Interventions: 15 minute checks at irregular intervals;Encourage to attend groups to learn coping skills, stress management, symptome management;Encourage attending DBT/CBT groups;Pharmacological Treatment;Reassess risk  daily, upon status change and discharge        Mental Status Exam:     Apparent Age: Appears Actual Age  Hygiene/Grooming: Neat;Clean  General Attitude: Pleasant;Cooperative  Motor Activity: Unremarkable  Eye Contact: Appropriate  Facial Expression:  (calm)  Patient Behaviors: Calm;Cooperative  Impulsivity: Normal  Speech Pattern: Within Defined Limits  Mood: Within Defined Limits  Affect: Responsive  Affect congruent with mood: Yes  Delusions: Within Defined Limits  Perception: Appropriate  Hallucination: None  Thought content appropriate to situation: Yes  Danger to Others (WDL): Within Defined Limits  Cognition: Follows 2-step commands;Appropriate safety awareness  Orientation Level: Oriented X4  Insight: Limited  Judgement: Poor           Edmonson Fall Risk     Age: Less than 50  Mental Status: Fully Alert/ Oriented at all times  Elimination: Independent with control of bowel/ bladder  Medication: Psychotropic medications ( including benzos and antidepressants)  Ambulation/ Balance: Independent/ Steady gait/ Immobile  Nutrition: No apparent abnormalities with appetite  Sleep Disturbance: No sleep disturbance  History of Falls: No history of falls  Secondary Diagnosis: Cardiac/ HTN/ Diabetic  Edmonson Fall Risk Score: 53             Patient Checks:     Interventions: Dayroom observation  Visual Checks: Standard Q15  Arm Bands On: ID  Patient Checked for Contraband: Belongings checked, Body checked, Clothing checked        Safety:     Depression rating: 1  Anxiety rating: 2  Self Injurious Thoughts: Denies  Self Injurious Behaviors: None observed  Thoughts of Harming Others: Denies  Current Thoughts of Aggression: No  Elopement risk?: No        DASA     Irritablity: No  Verbal Threats: No  Impulsivity: No  Negative attitude: No  Unwillingness to follow direction: No  Sensitivity to perceived provocation: No  Easily angered when request denied: No  Total Score - DASA: 0      Withdrawl Symptoms:            Hygiene:      Level of Assistance: Independent      Nutrition Screen:     Feeding: Able to feed self  Diet Type: Regular  Appetite: Sarah Haskell, RN  09/12/2021  2:28 PM

## 2021-09-12 NOTE — Unmapped (Signed)
Group Note    Patient Name: Sarah [name]    Date: 09/12/2021      Time: 0900     Group Type: Goal Setting    Group Name: Community    Group Objective: This clinician assessed patients' physical and mental wellness via group discussion and review of daily check in document. Clinician also assessed patients' for presence of suicidal and/or homicidal ideation, plans or intentions and addressed patient concerns. Unit schedule, rules and regulation were reviewed. In addition, group discussed the importance of hygiene as needed.      Attendance: Attended    Interactions: Interacted appropriately    Mood/Affect: Appropriate    Patient Participation: Pt was active and present in community goals group. They engaged in self-reflection and completed check-in worksheet. Pt shared amongst group members and/or listened to others. Pt created a SMART goal during group session.    Gracen Ringwald MHS

## 2021-09-12 NOTE — Unmapped (Signed)
Name: Cybil Senegal    Date: 09/12/2021    Time: 2:54 PM    Group Type: Recreation Therapy    Group Name: Creative Expression    Group Objective: The patients painted and mod podged river stones using different patterns of washi tape. The patients were encouraged to think outside the box and try something new. The group focused on mindfulness, creative expression, relaxation, positive coping skills and leisure skill development.    Attendance: Attended    Interaction: Interacted appropriately     Mood/Affect: Appropriate    Participation: Pt attended group and engaged in the activity and group discussion. Pt was social and remained on task and focused.       Jalina Blowers  [employer]

## 2021-09-12 NOTE — Unmapped (Signed)
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   Goal: Goal 1:STG/Objective  Description: Sarah [name] will participate in at least 2 groups daily by 09/18/2021.    Intervention: Intervention A  Description: Engineer, drilling will administer prescribed PRN medications for Sarah [name] for high levels of anxiety. (Severe or panic)     Responsible staff: Antony Salmon, RN / Designee   Note: C/O headache. Stated it was not so bad that she wanted prn medication. In room visiting with female visitor. Rates anxiety 1 and depression 0. Denies any current SI. C/O rapidly functionating mood. Stated this morning she was very irritable for no reason. Short time later she was feeling better.      Sarah [name] was cooperative, visible on the unit.    Attended 2 groups this shift.   Ate 100% of dinner.   Rates anxiety 1/10 and depression 0/10.   Denies SI/HI, A/V/H or pain.   Verbally contracts for safety and will notify staff if feeling unsafe.   CSSRS is low risk.    Medication compliant   PRN medications: Zyprexa         1. Have you been around someone who has traveled internationally within the last 14 days? no  2. If yes, do you have a fever or flu symptoms (cough, sore throat, runny nose, body aches, etc.)? no  3. Have you developed any new flu symptoms (cough, sore throat, runny nose, body aches, etc.) since your admission that you did not have prior to admitting? no       Daily Medication Education Questions:  1. Do you have questions about your medications? no  2.   Any questions about side effects and how to manage them? no  3.   Any questions on how you take your medications? No    [facility]   Inpatient Shift Assessment    Name: Sarah [name]  MRN: [id number]  Admission date: 09/09/2021        Vitals:     Temp: 98.1 ??F (36.7  ??C)  Temp Source: Oral  Heart Rate: 78  Resp: 18  BP: 128/74  BP Location: Left arm  BP Method: Automatic  Patient Position: Sitting  SpO2: 99 %  O2 Device: None (Room air)  Height: 5' 7 (170.2 cm)  Weight: (!) 295 lb 9.6 oz (134.1 kg)  BMI (Calculated): 46.4      Pain/ Pain Reassessment:     Pain Score: 5          Intake:            Output:            POCT Glucose:            [place] Suicide Severity Rating Scale - Frequent Screener:     Have you actually had any thoughts of killing yourself since the last time you were asked?: No  Have you done anything, started to do anything, or prepared to do anything to end your life?: No    Assigned Risk: Low Risk  Low Risk Interventions: 15 minute checks at irregular intervals        Mental Status Exam:     Apparent Age: Appears Actual Age  Hygiene/Grooming: Neat;Clean  General Attitude: Pleasant;Cooperative  Motor Activity: Unremarkable  Eye Contact: Appropriate  Facial Expression: Animated  Patient Behaviors: Calm;Cooperative  Impulsivity: Normal  Speech Pattern: Within Defined Limits  Mood: Within Defined Limits  Affect: Responsive  Affect congruent with mood: Yes  Content: Within Defined Limits  Delusions: Within Defined Limits  Perception: Appropriate  Hallucination: None  Thought content appropriate to situation: Yes  Danger to Others (WDL): Within Defined Limits  Cognition: Follows 2-step commands  Orientation Level: Oriented X4  Insight: Limited  Judgement: Poor           Edmonson Fall Risk     Age: Less than 50  Mental Status: Fully Alert/ Oriented at all times  Elimination: Independent with control of bowel/ bladder  Medication: Psychotropic medications ( including benzos and antidepressants)  Ambulation/ Balance: Independent/ Steady gait/ Immobile  Nutrition: No apparent abnormalities with appetite  Sleep Disturbance: No sleep disturbance  History of Falls: No history of falls  Secondary Diagnosis: Cardiac/ HTN/ Diabetic  Ed[place]on Fall Risk Score: 53              Patient Checks:     Interventions: Call bell within reach  Visual Checks: Standard Q15  Arm Bands On: ID  Patient Checked for Contraband: Belongings checked, Body checked, Clothing checked        Safety:     Depression rating: 1  Anxiety rating: 0  Self Injurious Thoughts: Denies  Self Injurious Behaviors: None observed  Thoughts of Harming Others: Denies  Current Thoughts of Aggression: No  Elopement risk?: No        DASA     Irritablity: No  Verbal Threats: No  Impulsivity: No  Negative attitude: No  Unwillingness to follow direction: No  Sensitivity to perceived provocation: No  Easily angered when request denied: No  Total Score - DASA: 0      Withdrawl Symptoms:            Hygiene:     Level of Assistance: Independent      Nutrition Screen:     Feeding: Able to feed self  Diet Type: Regular  Appetite: Good        Quinn Plowman, RN  09/12/2021  9:27 PM

## 2021-09-12 NOTE — Unmapped (Signed)
Group Note    Patient Name: Sarah [name]    Date: 09/12/2021      Time: 1615     Group Type: DBT- Distress Tolerance    Group Name: Radical Acceptance and Willingness    Group Objective: Taught patients distress tolerance skills of radical acceptance/turning the mind and Willingness vs. Willfulness. Explained the meaning behind radical acceptance, what has to be accepted, and why it is important to accept reality. Discussed factors that interfere with radical acceptance. Went through the steps of radical acceptance and turning the mind. Encouraged peer discussion of realities pts' are refusing to accept and those they have an easier time accepting. Educated patients' on the concept of willingness vs. willfulness. Engaged patients' in discuss about what willingness looks and feels for them in contrast to what willfulness looks and feels like for them.      Attendance: Attended    Interactions: Interacted appropriately    Mood/Affect: Appropriate    Patient Participation: Patient attended group and completed worksheet and shared with peers. Patient engaged in conversation about group topics.    Leonidus Rowand, MHS

## 2021-09-12 NOTE — Unmapped (Signed)
Group Note    Patient Name: Sarah [name]    Date: 09/12/2021      Time: 0930     Group Type: CBT    Group Name: Healthy vs. Unhealthy Coping Strategies    Group Objective: To teach patients about the importance of using healthy coping strategies, and then guide them toward applying this knowledge in their own lives     Attendance: Attended    Interactions: Interacted appropriately    Mood/Affect: Appropriate    Patient Participation: Patient actively attended and participated in group. Patient engaged in discussion about group topics. Patient shared their examples with the group.     Dechelle Attaway, MHS

## 2021-09-13 MED ORDER — ARIPiprazole (ABILIFY) 2 MG tablet
2 | ORAL_TABLET | Freq: Every day | ORAL | 0 refills | Status: AC
Start: 2021-09-13 — End: ?

## 2021-09-13 MED ORDER — olanzapine zydis (ZYPREXA) 5 MG disintegrating tablet
5 | ORAL_TABLET | Freq: Every day | ORAL | 0 refills | Status: AC | PRN
Start: 2021-09-13 — End: ?

## 2021-09-13 MED ORDER — cholecalciferol, vitamin D3, 1000 units tablet
1000 | ORAL_TABLET | Freq: Every day | ORAL | 0 refills | Status: AC
Start: 2021-09-13 — End: ?

## 2021-09-13 MED ORDER — OXcarbazepine (TRILEPTAL) 150 MG tablet
150 | ORAL_TABLET | Freq: Two times a day (BID) | ORAL | 0 refills | Status: AC
Start: 2021-09-13 — End: ?

## 2021-09-13 MED FILL — PROPRANOLOL 10 MG TABLET: 10 10 MG | ORAL | Qty: 1

## 2021-09-13 MED FILL — HYDROCHLOROTHIAZIDE 25 MG TABLET: 25 25 MG | ORAL | Qty: 1

## 2021-09-13 MED FILL — FLUOXETINE 20 MG CAPSULE: 20 20 MG | ORAL | Qty: 1

## 2021-09-13 MED FILL — ARIPIPRAZOLE 2 MG TABLET: 2 2 MG | ORAL | Qty: 1

## 2021-09-13 MED FILL — IBUPROFEN 400 MG TABLET: 400 400 MG | ORAL | Qty: 1

## 2021-09-13 MED FILL — OXCARBAZEPINE 150 MG TABLET: 150 150 MG | ORAL | Qty: 1

## 2021-09-13 NOTE — Unmapped (Signed)
Group Note    Patient Name: Sarah [name]    Date: 09/13/2021      Time: 9:30     Group Type: CBT    Group Name: Grounding Techniques    Group Objective: To help control racing thoughts, anxiety, flashbacks and other similar uncomfortable perceptions by turning attention away from thoughts, memories, or worries and refocusing on the present moment.     Attendance: Attended    Interactions: Interacted appropriately    Mood/Affect: Appropriate    Patient Participation: Sarah [name] attended the group and provided input consistent with the objective. The patient engaged in discussion with the group about what grounding techniques are and when to use them.    Daishaun Ayre J Ashleigh Arya, MHS

## 2021-09-13 NOTE — Unmapped (Signed)
Patient participated in a time of prayer with the Chaplain held on the unit. The patient shared prayer requests and was grateful for the time together.    Erine Phenix-MTS  Spiritual Care

## 2021-09-13 NOTE — Unmapped (Signed)
Group Note    Patient Name: Sarah [name]    Date: 09/13/2021       Time:9:00 am     Group Type:CBT    Group Name:  community goals    Attendance: attend    Interactions: interacted appropriately    Mood/ Affect: appropriate    Group Objective: Assessed patients physical and mental health wellness, assessed patients for presence of suicidal and  or homicidal ideations plans or intentions and addressed patients concerns, also what is a goal for today.    Patient participations.: patient verbalized no SI/HI, anxiety rated 1/10 and depressions rated 1/10  goal for today is to  stabilize mood and go home.          Carola Rhine MHS
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
## 2021-09-13 NOTE — Unmapped (Signed)
Name: Sarah ReinLisa Boven    Date: 09/13/2021    Time: 11:56 AM    Group Type: Recreation Therapy    Group Name: Peaceful Place    Group Objective: Purpose of this group is to enable participants to feel comfortable in using and expressing their imagination. They were asked to create a watercolor painting of a peaceful and calm place for them.  Participants were encouraged to provide as much detail as possible in the picture.      Attendance: Attended    Interaction: Interacted appropriately     Mood/Affect: Appropriate    Participation: Pt participated in RT group. She was social and appropriate. In and out of group to meet wit treatment team staff to prepare for D/C. Able to paint some in between meetings.       Dian Minahan FoxfieldWILLIAMS, CTRS

## 2021-09-13 NOTE — Unmapped (Signed)
Problem: Ineffective Coping Skills  Description: Patient reporting having increased energy, impulsive behaviors to include [place] [facility]. She reported irritability at times and inability to sit still and focus.  Intervention: Intervention  Description: SW will communicate with the patient's boyfriend to provide updates on her progress and discharge plans. SW will input outpatient appointments for patient in their AVS and review with them.    Responsible staff: Mort SawyersANNIELLE STEWART, MSW, LISW    Note: Discharge Note:  Pt leaving today per Areta HaberAlyssa Wall, NP, with her mother to transport home. The patient satisfaction survey was placed on the chart for the RN to give to the pt to complete. Pt signed ROIs for outpatient providers. Pt has 30 days scripts for medications. Pt and SW reviewed discharge safety plan as well as pt's follow up appointments, which are listed below. Pt reports having the times of her appointments at home. Pt's mood was euthymic and affect was congruent, denied SI/HI, denied A/V hallucinations, appeared alert and oriented x4, appeared to have organized thoughts. SW spoke with mother regarding the discharge plan while she was here.    Discharge Information  Referred to: Outpatient  Smoking Cessation offered: N/A  DC Plan discussed w/ pt/pt representative?: Yes  Discharge Disposition: Own home  Transportation: Family  Financial Support: Employeed Full Time  Support Services: Family, Other (boyfriend)  Recreation/ Leisure Resources: Reading, Exercise, Sports, Other (read, scroll on her phone)  Discharge Legal Status: N/A     Follow-up Information       Heide SparkJennifer Baker, APRN. Go on 09/18/2021.    Why: Medication Management  Contact information:  [facility]  [address]  [place], [place] [address]  Phone: [phone]  Fax: [phone]             Sena HitchMaria Pena Kumpf LCDCIII, MHF, MMP. Go on 09/24/2021.    Why: Individual Therapy  Contact  information:  [facility]  [address]  [place], [place] [address]  [phone]  Fax: [phone]

## 2021-09-13 NOTE — Unmapped (Signed)
Sarah [name] appeared to sleep 7 hours this night shift.  Q15 minute safety checks maintained throughout shift per unit protocol.

## 2021-09-13 NOTE — Unmapped (Signed)
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   Goal: Goal 1:STG/Objective  Description: Sarah [name] will participate in at least 2 groups daily by 09/18/2021.    Outcome: Progressing  Intervention: Intervention A  Description: Engineer, drillingtaff RN will administer prescribed PRN medications for Sarah [name] for high levels of anxiety. (Severe or panic)     Responsible staff: Antony SalmonAna Henize, RN / Designee   Note:     Sarah [name] exhibited a responsive affect and anxious mood.  He/She attended and engaged in all programming and was social with peers and staff.  Pt. Was compliant with scheduled medications and received Ibuprofen 400mg  at 0820 for 5/10 back pain, which was resolved.  At 1001, Sarah [name] received Propranolol 10 mg for anxiety-which was also effective.  She consumed 100 % of breakfast and 90 % of lunch.  Sarah [name] rated anxiety 1/10 and depression 1/10.  She denied SI/HI/AVH this shift.  Sarah [name] is able to verbalize that she will remain safe at this time. Therefore, based on the treatment guidelines and the opinion of the RN and treatment team,  she may have their own clothes, standard linens and all silverware with meals. CSSRS =   Low    Reviewed all discharge instructions with Sarah [name] including importance of medication management. Encouraged to keep medication list updated as medications are added, changed or deleted. Encouraged to keep list on person at all times in case of an emergency & to give a copy of the updated medication list to next care provider. Information given regarding discharge meds and prescriptions were given as indicated. CSSRS = Low Risk, patient verbalized understanding. Sarah [name] was picked up by family to be driven home. Patient was escorted to lobby with their belongings accompanied by  Sarah [name], MHT at 1400.       Have you developed any new flu symptoms (cough, sore throat, runny nose, body aches, etc.) since your admission that you did not have prior to admitting? no  If patient answered yes to 2 and/or 3 please provide details about symptoms related to these questions.        [facility]   Inpatient Shift Assessment    Name: Sarah [name]  MRN: [id number]  Admission date: 09/09/2021        Vitals:     Temp: 98.2 ??F (36.8 ??C)  Temp Source: Oral  Heart Rate: 77  Resp: 16  BP: 136/69  BP Location: Right arm  BP Method: Automatic  Patient Position: Sitting  SpO2: 98 %  O2 Device: None (Room air)  Height: 5' 7 (170.2 cm)  Weight: (!) 295 lb 9.6 oz (134.1 kg)  BMI (Calculated): 46.4      Pain/ Pain Reassessment:     Pain Score: 5   Pain Location: Back  Pain Orientation: Lower  Pain Descriptors: Aching  Pain Intervention(s): Medication (See eMAR)         Intake:            Output:            POCT Glucose:            [place] Suicide Severity Rating Scale - Frequent Screener:     Have you actually had any thoughts of killing yourself since the last time you  were asked?: No  Have you done anything, started to do anything, or prepared to do anything to end your life?: No    Assigned Risk: Low Risk  Low Risk Interventions: 15 minute checks at irregular intervals;Reassess risk daily, upon status change and discharge;Off Unit only accompanied by staff;Pharmacological Treatment;Family/Significant other engagement;Encourage attending DBT/CBT groups;Encourage to attend groups to learn coping skills, stress management, symptome management        Mental Status Exam:     Apparent Age: Appears Actual Age  Hygiene/Grooming: Neat;Clean  General Attitude: Pleasant;Cooperative  Motor Activity: Freedom of movement  Eye Contact: Appropriate  Facial Expression: Animated  Patient Behaviors: Calm;Cooperative  Impulsivity: Normal  Speech Pattern: Within Defined Limits  Mood: Within Defined Limits  Affect:  Responsive  Affect congruent with mood: Yes  Content: Within Defined Limits  Delusions: Within Defined Limits  Perception: Appropriate  Hallucination: None  Thought content appropriate to situation: Yes  Danger to Others (WDL): Within Defined Limits  Thought process: Appropriate  Memory Impairment: None  Cognition: Follows 2-step commands  Orientation Level: Oriented X4  Intelligence: Average  Attention Span: Easily distracted  Insight: Limited  Judgement: Average           [place] Fall Risk     Age: Less than 50  Mental Status: Fully Alert/ Oriented at all times  Elimination: Independent with control of bowel/ bladder  Medication: Psychotropic medications ( including benzos and antidepressants)  Psych Diagnosis: Major depressive disorder  Ambulation/ Balance: Independent/ Steady gait/ Immobile  Nutrition: No apparent abnormalities with appetite  Sleep Disturbance: No sleep disturbance  History of Falls: No history of falls  Secondary Diagnosis: Cardiac/ HTN/ Diabetic  [place] Fall Risk Score: 30  Date of Last Fall:  (none reported)          Patient Checks:     Interventions: Call bell within reach  Visual Checks: Standard Q15  Arm Bands On: ID  Patient Checked for Contraband: Belongings checked, Body checked, Clothing checked        Safety:     Depression rating: 1  Anxiety rating: 1  Self Injurious Thoughts: Denies  Self Injurious Behaviors: None observed  Thoughts of Harming Others: Denies  Current Thoughts of Aggression: No  Elopement risk?: No  Methods to Calm Down: PRN medications;Talk with staff        DASA     Irritablity: No  Verbal Threats: No  Impulsivity: No  Negative attitude: No  Unwillingness to follow direction: No  Sensitivity to perceived provocation: No  Easily angered when request denied: No  Total Score - DASA: 0      Withdrawl Symptoms:            Hygiene:     Level of Assistance: Independent      Nutrition Screen:     Feeding: Able to feed self  Diet Type: Regular  Appetite:  Good        Amelia Jo, RN  09/13/2021  10:42 AM

## 2021-11-28 ENCOUNTER — Inpatient Hospital Stay
Admit: 2021-11-28 | Discharge: 2021-11-28 | Disposition: A | Discharge status: Home / Self Care | Payer: PRIVATE HEALTH INSURANCE

## 2021-11-28 DIAGNOSIS — S39012A Strain of muscle, fascia and tendon of lower back, initial encounter: Secondary | ICD-10-CM

## 2021-11-28 MED ORDER — HYDROmorphone (DILAUDID) injection Syrg 1 mg
1 | Freq: Once | INTRAMUSCULAR | Status: AC
Start: 2021-11-28 — End: 2021-11-28
  Administered 2021-11-28: 14:00:00 1 mg via INTRAMUSCULAR

## 2021-11-28 MED ORDER — lidocaine (LIDODERM) 5 % 1 patch
5 | Freq: Once | TOPICAL
Start: 2021-11-28 — End: 2021-11-28
  Administered 2021-11-28: 13:00:00 1 via TRANSDERMAL

## 2021-11-28 MED ORDER — ketorolac (TORADOL) injection 15 mg
15 | Freq: Once | INTRAMUSCULAR | Status: AC
Start: 2021-11-28 — End: 2021-11-28
  Administered 2021-11-28: 13:00:00 15 mg via INTRAMUSCULAR

## 2021-11-28 MED ORDER — methocarbamoL (ROBAXIN) tablet 1,500 mg
500 | Freq: Once | ORAL | Status: AC
Start: 2021-11-28 — End: 2021-11-28
  Administered 2021-11-28: 13:00:00 1500 mg via ORAL

## 2021-11-28 MED ORDER — lidocaine (LIDODERM) 5 %
5 | MEDICATED_PATCH | TOPICAL | 0 refills | 30.00000 days | Status: AC
Start: 2021-11-28 — End: ?

## 2021-11-28 MED FILL — KETOROLAC 15 MG/ML INJECTION SOLUTION: 15 15 mg/mL | INTRAMUSCULAR | Qty: 1

## 2021-11-28 MED FILL — METHOCARBAMOL 500 MG TABLET: 500 500 MG | ORAL | Qty: 3

## 2021-11-28 MED FILL — HYDROMORPHONE 1 MG/ML INJECTION SYRINGE: 1 1 mg/mL | INTRAMUSCULAR | Qty: 1

## 2021-11-28 MED FILL — LIDODERM 5 % TOPICAL PATCH: 5 5 % | TOPICAL | Qty: 1

## 2021-11-28 NOTE — Unmapped (Signed)
ED Attending Attestation Note    Date: 11/28/2021           This patient was seen by the advanced practice provider. I have seen and examined the patient, agree with the workup, evaluation, management and diagnosis. Care plan has been discussed.      Assessment reveals 36 y.o. female who presents with history of back pain currently seeing her primary care physician who presents to the emergency department for worsening back pain.  Patient states that she also has history of hypermobile joints likely causing her pain.  She states that she awoke from her sleep with severe spasm in her lower back.  She states that it does go down her left leg.  She has started Mobic and Robaxin for this earlier this week.  She denies any falls or trauma.  No loss of bladder or bowel function.  No recent fevers, cough, shortness of breath or chest pain.    On examination find adult female, speaking in complete sentences.  She has no increased work of breathing or accessory muscle use during respiration.  The patient has no midline C/T or L-spine tenderness to palpation.  The patient has spastic paravertebral musculature in the left lumbar spine that is tender to palpation.    We will proceed with symptomatic care and plan for outpatient follow-up.  Patient already has plans for orthopedic evaluation in the outpatient setting.    Fayrene Fearing, MD MPH   [facility] Emergency Medicine.

## 2021-11-28 NOTE — Unmapped (Signed)
EIP NOTE       Pt approached by EIP staff, pt consented to RA declined HIV test.   Resource packet given.       EIP on-site# [id number]

## 2021-11-28 NOTE — Unmapped (Signed)
Pt discharged to home. Pt verbalized understanding of discharge instructions. All questions answered. Ambulated from room with steady gait.

## 2021-11-28 NOTE — Unmapped (Addendum)
Patient reports severe lower back pain that radiates down the left front thigh and calf, pain started about two weeks but the severe pain started Friday and says she can't take it anymore.  Patient has taken tylenol, ibuprofen, mobic, diclofenac and none of these helped.

## 2021-11-28 NOTE — Unmapped (Signed)
[place] ED Note  Date of Service: 11/28/2021  Reason for Visit: Back Pain      Patient History     HPI:  Sarah [name] is a 36 y.o. female who presents to the Emergency Department with a chief complaint of back pain.  Patient recently moved. 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 Patient reports worsening low back pain since moving. No fall or direct trauma. No numbness or weakness. No loss of bowel/bladder control. No fevers. No IVDU. No history of back surgery. Patient took mobic around 5 pm yesterday and robaxin around 9 pm yesterday with some relief but woke up this morning with continued pain and presents here. Has appointment scheduled with Ortho. Has not scheduled PT yet.       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    UTI (lower urinary tract infection) 01/20/2012       Past Surgical History:   Procedure Laterality Date    CHOLECYSTECTOMY      KNEE CARTILAGE SURGERY         Family History   Problem Relation Age of Onset    Hypertension Mother     Anxiety disorder Father     Diabetes Father     Hypertension Father     Depression Sister     OCD Sister     Deep vein thrombosis Sister     Pulmonary embolism Sister     Anxiety disorder Brother     Depression Brother     Schizophrenia Maternal Uncle     Mental illness Maternal Uncle         Schizophrenia    Mental illness Paternal Grandfather         Schizophrenia       Sarah [name]  reports that she has never smoked. She has never used smokeless tobacco.  She reports current alcohol use. She reports that she does not use drugs.    Discharge Medication List as of 11/28/2021 11:07 AM        CONTINUE these medications which have NOT CHANGED    Details   ARIPiprazole (ABILIFY) 2 MG tablet Take 1 tablet (2 mg total) by mouth daily. Indications: MOOD, Starting Sat 09/14/2021, Normal, Disp-30 tablet, R-0      cholecalciferol, vitamin D3, 1000 units tablet Take 2 tablets (2,000 Units total) by mouth daily. Indications: vitamin D deficiency, Starting Tue 10/08/2021, Normal, Disp-60 tablet, R-0      FLUoxetine (PROZAC) 20 MG capsule Take 20 mg by mouth daily. Indications: depression, Historical Med      hydroCHLOROthiazide (HYDRODIURIL) 25 MG tablet Take 25 mg by mouth daily. Indications: hypertension, Historical Med      olanzapine zydis (ZYPREXA) 5 MG disintegrating tablet Take 1 tablet (5 mg total) by mouth daily as needed (for agitation). Indications:  AGITATION, Starting Fri 09/13/2021, Normal, Disp-5 tablet, R-0      OXcarbazepine (TRILEPTAL) 150 MG tablet Take 3 tablets (450 mg total) by mouth 2 times a day. Indications: Bipolar Disorder, Starting Fri 09/13/2021, No Print, Disp-180 tablet, R-0      propranoloL (INDERAL) 10 MG tablet Take 10 mg by mouth 2 times a day as needed. Indications: anxiety, Historical Med             Allergies:   Allergies as of 11/28/2021 - Fully Reviewed 11/28/2021   Allergen Reaction Noted    Clindamycin  01/04/2013    Corticosteroids (glucocorticoids)  09/09/2021    Prednisone  02/18/2014    Morphine Anxiety 11/28/2021       Review of Systems     Review of Systems   Constitutional: Negative.  Negative for fever.   Respiratory: Negative.     Cardiovascular: Negative.    Gastrointestinal: Negative.  Negative for abdominal pain.   Genitourinary: Negative.    Musculoskeletal:  Positive for back pain.   Neurological: Negative.  Negative for focal weakness.   Psychiatric/Behavioral: Negative.     All other systems reviewed and are  negative.        Physical Exam     General: Well-developed well-nourished in no acute distress.  HEENT: Head normocephalic atraumatic.  Pupils equal and round.  External ears and nose normal.   Neck: Full range of motion, supple.  Pulmonary: Lungs clear to auscultation bilaterally.  No wheezing, rhonchi, or rales.  Cardiac: Regular rate and rhythm.   Musculoskeletal: Moving all extremities appropriately, no obvious weakness noted.  No focal midline tenderness, has tenderness diffusely to the lumbar region. 5/5 strength BLE. Negative straight leg test bilaterally. Motor and sensory intact. 2/4 DP pulses.  Vascular: Palpable pulses to all extremities.  No pitting edema noted.  Skin: Warm and dry.  Neuro: Alert and oriented.  Cranial nerves II through XII without deficit.    Psych:  Normal affect, Normal judgement, Normal mood.  Normal affect, and behavior.    ED Course and MDM     MEDICAL DECISION MAKING    RECENT VITALS:  BP: 113/71, Temp: 97.5 F (36.4 C), Heart Rate: 58, Resp: 16     RADIOLOGY:  No orders to display       LABS:   Labs Reviewed - No data to display    MEDS:  Medications   lidocaine (LIDODERM) 5 % 1 patch (1 patch Transdermal Patch Applied 11/28/21 0846)   ketorolac (TORADOL) injection 15 mg (15 mg Intramuscular Given 11/28/21 0844)   methocarbamoL (ROBAXIN) tablet 1,500 mg (1,500 mg Oral Given 11/28/21 0842)   HYDROmorphone (DILAUDID) injection Syrg 1 mg (1 mg Intramuscular Given 11/28/21 1011)         PROCEDURES: N/A    CONSULTS:  None    MEDICAL DECISION MAKING / ED COURSE:    The patient was seen and examined by myself and presented to Dr. No att. providers found, who also examined the patient and agrees with assessment and plan.     Sarah [name] is a 36 y.o. female who presents to the Emergency Department with a chief complaint of back pain.  Patient appears uncomfortable but is in no acute distress.  Vitals are normal with exception of hypertension on arrival.  No focal midline tenderness.  No  neurologic deficits.  Low suspicion for cord compression, cauda equina, epidural abscess, epidural hematoma.  No trauma to indicate imaging.  Patient will be managed symptomatically  for musculoskeletal pain.  To follow-up with PT and orthopedics as planned.  Return precautions discussed.    Medical Decision Making  Problems Addressed:  Lumbar strain, initial encounter: complicated acute illness or injury    Risk  Prescription drug management.        The patient tolerated their visit well.  They were seen and evaluated by the attending physician who agreed with the assessment and plan.   The patient and / or the family were informed of the results of any tests, a time was given to answer questions, a plan was proposed and they agreed with plan.  This patient was seen in conjunction with a student.     DISCHARGE DIAGNOSIS:  1. Lumbar strain, initial encounter        PATIENT REFERRED TO:  Pilar Grammes, MD  [address] [place] [address]  [phone]    Schedule an appointment as soon as possible for a visit       [facility] Emergency Department  [address]  [place] [address]  [phone]    As needed, If symptoms worsen      DISCHARGE MEDICATIONS:  Discharge Medication List as of 11/28/2021 11:07 AM        START taking these medications    Details   lidocaine (LIDODERM) 5 % Place 1 patch onto the skin daily. Apply patch for 12 hours and then remove patch and leave off for 12 hours., Starting Thu 11/28/2021, Normal, Disp-30 patch, R-0               Critical Care Time (Attendings)          Kathyrn Sheriff, [place]  11/28/21 1229

## 2022-09-17 NOTE — Progress Notes (Signed)
Formatting of this note is different from the original.  [facility]  Obesity Counseling Management Visit    NAME: Sarah [name] MRN: [id number]    DOB: Feb 12, 1986  PCP: Melvern Banker, MD    PROVIDER: Kathleene Hazel  Referring: No ref. provider found       Subjective:     Patient ID: Sarah [name] is a 37 year old female.    HPI:  Reason for Visit: Follow-up for Morbid Obesity  post surgery  Any diet restrictions?: bariatric surgery  Chronic Conditions: Depression, Hypertension and Joints    Does this meet goal of 1-2 lbs a week weight loss? No: Assess motivation: if high encourage resumption of lifestyle changes if lack of motivation consider discontinuing of clinical therapy with encouragement to maintain weight or encourage motivation to embark on weight loss:     Lifting weights, elluiptical 20 minutes, increasing cardio  No change to psych meds  Down size and 1/2, losing inches  Stress-getting married  Helped with tore procedure    Had to stop contrave  Hx of medication-induced pancreatitis   topamax caused brain fog    No SE metformin    Energy intake: address dietary log including alcohol and caffeine consumption:water  Energy expenditure: physical activity diary including pedometer, duration, type of exercise and frequency of exercise:     Gym 3-4 times a week- not recently   Additional: attendance at behavioral therapy group meetings, recent negative life events, family and societal pressures, or evidence of detrimental psychiatric problems (depression, binge eating disorder): very high stress  If attempts to lose weight have failed after 6 months and BMI >40, consider referral to bariatric surgery: gastric bypass- weight regain    Diet;   Sarah [name] has been using the  post surgery  for 19 Months.  Location of excess weight: abdomen  Severity of weight: severe  They are consuming:  n/a kcal/day  Are they adherent?: yes  What are the alleviating factors?: not having temptations in the house  and not buying junk food  What are the aggravating factors?: family, lack of exercise, motivation and stress, hunger with medications, no structure  Quality of Life: average  Associated symptoms: (See ROS)  Any signs of orthostatic hypotension?: No  Are they taking a weight loss medication?: no- not allowed stiumlants, on metformin  Are they taking meal replacements?:  Yes - 2 meal replacements. Do they want to decreasse any meal replacements?:  No  Physical activity:  Are they exercising at least 30 minutes a day of moderate exercise?:  Yes:  Encourage continuation and consider ways to increase frequency/duration or intensity    Assess Goals and Expectations:  Sometimes it is hard to keep on track when the goals seem so far away. Have you experienced that?: Yes: have them describe something, reinforce this, document and ask about at next visit.  What are you doing to keep yourself focused on your healthy behaviors?: mental health focus    No outpatient medications have been marked as taking for the 09/17/22 encounter (Office Visit) with Kathleene Hazel, MD.     Allergies   Allergen Reactions    Clindamycin Anaphylaxis    Clindamycin/Lincomycin Anaphylaxis     When took oral form throat swelled for a few days, but has taken since then with no reaction     Povidone Iodine Hives and Rash    Prednisone Other (See Comments) and Nausea And Vomiting  States any steroids  pancreatitis  pancreatitis  pt states she got pancreatitis after taking  Pancreatitis   Pt states she gets pancreatitis.   States any steroids  pancreatitis  pancreatitis     Dexamethasone Nausea And Vomiting    Liraglutide Other (See Comments)     Pancreatitis    Morphine Anxiety     Past Medical History:   Diagnosis Date    Abnormal Pap smear of cervix 2019    Acute pancreatitis     Anemia     Anxiety     Bipolar affective disorder (HCC)     Dr. Marnette Burgess    Cellulitis and abscess of leg     recurrent cellulitis. 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    Depressive disorder, not elsewhere classified     Edema     Hypertension     Hypokalemia 07/13/2012    Lymph edema     Morbid obesity (HCC)     PCOS (polycystic ovarian syndrome)     [name]    Unspecified essential hypertension      Past Surgical History:   Procedure Laterality Date    ARTHROSCOPY KNEE Right     lateral release    CARPAL TUNNEL RELEASE Left     CHOLECYSTECTOMY WITH/WITHOUT COMMON DUCT EXPLORATION  2003    [facility]    COLPOSCOPY  2019    EGD N/A 03/21/2022    Procedure: UPPER ENDOSCOPY;  Surgeon: Hardie Lora, MD;  Location: [facility];  Service: General;  Laterality: N/A;  UPPER ENDOSCOPY    EGD N/A 06/16/2022    Procedure: ESOPHAGOGASTRODUODENOSCOPY WITH POSSIBLE BIOPSY AND/OR DILATATION AND OR/POLYPECTOMY AND TRANSORAL OUTLET REDUCTION;  Surgeon: Trellis Moment., MD;  Location: [facility];  Service: Gastroenterology;  Laterality: N/A;  ESOPHAGOGASTRODUODENOSCOPY TRANSORAL OUTLET REDUCTION    GASTRIC BYPASS      GASTRIC BYPASS,OBESITY,SB RECONSTRUC       Social History     Tobacco Use    Smoking status: Never    Smokeless tobacco: Never   Vaping Use    Vaping Use: Never used   Substance Use Topics    Alcohol use: Yes     Alcohol/week: 0.8 standard drinks of alcohol     Types: 1 Shots of liquor per week     Comment: socially  1 x month    Drug use: No     History   Drug Use No       Review Systems   Review of Systems  Do they have any side effects from their eating plan?: yes    GI:  Hunger: Yes  Cravings: Yes  Nausea/vomiting/abdominal pain: No  Diarrhea/constipation: No  Indigestion: No  GU:  no urinary urgency, frequency, dysuria, nocturia, hesitancy, or incontinence  Endocrine:  Low blood sugar: No  Feeling cold: No  Hair loss: No  Musculoskeletal:  Gout: No  Leg aches: No  Neuro:  Headaches: No  Lack of focus:  No  Lightheadedness: No  Constitutional:  Energy: No  Insomnia: No  Psycho/Social:  Irritation: No  Mood Swings: No  Depression:  No  Cardiac:  Rapid heart rate: No  Fluid retention: No  Respiratory:  Shortness of breath: No  Integumentary:  Rash: No        Objective:     BP (!) 143/95   Pulse 88   Ht 67 (170.2 cm)   Wt 300 lb 12.8 oz (136.4 kg)  BMI 47.11 kg/m     Wt Readings from Last 3 Encounters:   09/17/22 300 lb 12.8 oz (136.4 kg)   09/17/22 300 lb 12.8 oz (136.4 kg)   07/22/22 306 lb (138.8 kg)     Weight loss 17 ibs    Body mass index is 47.11 kg/m.     Body Measurements:   Body fat measurement tool:     Body fat:    Neck:      Waist:       Physical Exam  Vitals reviewed.   Constitutional:       Appearance: Normal appearance.   HENT:      Head: Normocephalic and atraumatic.      Nose: Nose normal.   Pulmonary:      Effort: Pulmonary effort is normal.   Neurological:      Mental Status: She is alert.         Assessment & Plan:     Medical Monitoring:  LCD:  -Initial CBC, CMP, lipid, TSH, UA, EKG due today?: No  -weekly-monthly visit due today?: Yes  -CBC, CMP every 3 months unless symptoms due today?: No  -EKG every 30 lbs weight loss due today?: No  -Protein requirement for [phone] kcal/day: (1g x ideal body weight kg): No  -Protein requirement for over 1200 kcal/day: (1.5 g x IBW): Yes    Does patient chronic disease medication need adjusting? No  Continue with diet as prescribed  Prescribe weight loss medication  Refer to Dietician  Provide patient education  Established patient stable  Time spent in counseling: 15 minutes  Greater than 50% of the time spent counseling patient.   1. S/P gastric bypass    2. PCOS (polycystic ovarian syndrome)    3. BMI 45.0-49.9, adult (HCC)    4. Bipolar I disorder, most recent episode (or current) depressed, severe, specified as with psychotic behavior (HCC)    Doing well, watch stress eating  Ok to d/c metformin when ready  No orders of the defined types were placed in this encounter.      Kathleene Hazel, MD  Electronically signed by Kathleene Hazel, MD at 09/19/2022  9:22 PM EDT

## 2022-09-23 NOTE — Telephone Encounter (Signed)
Formatting of this note might be different from the original.  LoV- 06/12/22  NoV- 12/15/22  Hx NS- no  Last Fill- 02/21/22    Verified pharmacy    [id number] - [address], [place] - [address] AT [facility] [address]  [phone]   Electronically signed by Cassandria Santee, LPN at 41/32/4401 10:36 AM EDT

## 2022-11-06 ENCOUNTER — Inpatient Hospital Stay
Admit: 2022-11-06 | Discharge: 2022-11-13 | Disposition: A | Discharge status: Home / Self Care | Payer: PRIVATE HEALTH INSURANCE | Attending: Psychiatry | Admitting: Psychiatry

## 2022-11-06 DIAGNOSIS — F4310 Post-traumatic stress disorder, unspecified: Secondary | ICD-10-CM

## 2022-11-06 MED ORDER — hydroCHLOROthiazide (HYDRODIURIL) tablet 25 mg
25 | Freq: Every day | ORAL | Status: AC
Start: 2022-11-06 — End: 2022-11-13
  Administered 2022-11-07 – 2022-11-13 (×7): 25 mg via ORAL

## 2022-11-06 MED ORDER — acetaminophen (TYLENOL) tablet 650 mg
325 | Freq: Four times a day (QID) | ORAL | Status: AC | PRN
Start: 2022-11-06 — End: 2022-11-07
  Administered 2022-11-07: 01:00:00 650 mg via ORAL

## 2022-11-06 MED ORDER — sterile water (PF) Soln 2.1 mL
INTRAMUSCULAR | Status: AC | PRN
Start: 2022-11-06 — End: 2022-11-13

## 2022-11-06 MED ORDER — metFORMIN (GLUCOPHAGE-XR) 24 hr tablet 750 mg
750 | Freq: Two times a day (BID) | ORAL | Status: AC
Start: 2022-11-06 — End: 2022-11-13
  Administered 2022-11-08 – 2022-11-13 (×12): 750 mg via ORAL

## 2022-11-06 MED ORDER — busPIRone (BUSPAR) tablet 10 mg
10 | Freq: Two times a day (BID) | ORAL | Status: AC | PRN
Start: 2022-11-06 — End: 2022-11-07

## 2022-11-06 MED ORDER — OLANZapine (ZYPREXA) injection 10 mg
10 | INTRAMUSCULAR | Status: AC | PRN
Start: 2022-11-06 — End: 2022-11-13

## 2022-11-06 MED ORDER — lurasidone (LATUDA) tablet 60 mg
20 | Freq: Every day | ORAL
Start: 2022-11-06 — End: 2022-11-06

## 2022-11-06 MED ORDER — OXcarbazepine (TRILEPTAL) tablet 300 mg
300 | Freq: Two times a day (BID) | ORAL
Start: 2022-11-06 — End: 2022-11-10
  Administered 2022-11-07 – 2022-11-10 (×8): 300 mg via ORAL

## 2022-11-06 MED ORDER — buPROPion (WELLBUTRIN) tablet 100 mg
100 | Freq: Every day | ORAL | Status: AC
Start: 2022-11-06 — End: 2022-11-07
  Administered 2022-11-07: 12:00:00 100 mg via ORAL

## 2022-11-06 MED ORDER — polyethylene glycol (MIRALAX) packet 17 g
17 | Freq: Every day | ORAL | Status: AC | PRN
Start: 2022-11-06 — End: 2022-11-13

## 2022-11-06 MED ORDER — FLUoxetine (PROZAC) capsule 20 mg
20 | Freq: Every day | ORAL | Status: AC
Start: 2022-11-06 — End: 2022-11-13
  Administered 2022-11-07 – 2022-11-13 (×7): 20 mg via ORAL

## 2022-11-06 MED ORDER — olanzapine zydis (ZYPREXA) disintegrating tablet 5 mg
5 | ORAL | Status: AC | PRN
Start: 2022-11-06 — End: 2022-11-13

## 2022-11-06 MED ORDER — cholecalciferol (vitamin D3) tablet 2,000 Units
1000 | Freq: Every day | ORAL | Status: AC
Start: 2022-11-06 — End: 2022-11-13
  Administered 2022-11-07 – 2022-11-13 (×7): 2000 [IU] via ORAL

## 2022-11-06 MED ORDER — traZODone (DESYREL) tablet 50 mg
50 | Freq: Every evening | ORAL | Status: AC | PRN
Start: 2022-11-06 — End: 2022-11-13
  Administered 2022-11-07 – 2022-11-10 (×2): 50 mg via ORAL

## 2022-11-06 MED ORDER — lurasidone (LATUDA) tablet 60 mg
40 | Freq: Every day | ORAL | Status: AC
Start: 2022-11-06 — End: 2022-11-13
  Administered 2022-11-07 – 2022-11-13 (×7): 60 mg via ORAL

## 2022-11-06 MED ORDER — propranoloL (INDERAL) tablet 10 mg
10 | Freq: Two times a day (BID) | ORAL | Status: AC | PRN
Start: 2022-11-06 — End: 2022-11-07
  Administered 2022-11-06 – 2022-11-07 (×2): 10 mg via ORAL

## 2022-11-06 MED FILL — PROPRANOLOL 10 MG TABLET: 10 10 MG | ORAL | Qty: 1

## 2022-11-06 NOTE — Nursing Note (Signed)
Pt asleep at 2200 and appeared to sleep 8 hours this night shift. Q15 minute safety checks maintained throughout shift per unit protocol. PRN None    Lab works unable to collected at this time

## 2022-11-06 NOTE — Nursing Note (Signed)
Medications reviewed with patient, verified through Surescripts and reviewed with Dr. Margaretann Loveless.   Pt reported that she is currently on Latuda 60 mg daily - not 40 mg daily. [facility] erroneously filled the 40 mg script in June, this was not picked up at the pharmacy.   Pt. Is on Trileptal 300 mg bid, verified with patient and Surescripts.  Metformin XR 750  mg is ordered bid for weight management, verified through Surescripts.   Standard orders and PRN's. Zyprexa for emergency medicine.

## 2022-11-06 NOTE — Nursing Note (Signed)
Sarah [name] arrived on the unit at 1821 accompanied by staff.  [name] is being admitted for SI.  She was oriented to the unit and her room and demonstrated use of call light.  Unit Expectations and Guidelines and Programming schedule were reviewed with patient and given a copy of each.  Signature obtained on Medication Consent.  [name] brought no home medications with she to [facility]. Patient's 750mg  extended release Metformin is not available through the pharmacy, and the patient was informed by this RN that her husband can bring in her Metformin from home.  She rates anxiety 9/10 and depression 0/10.  Pt. Expresses concern that she is not currently experiencing depression but that she suffers moments of SI, which she said is a new development in her mental health. She  denies SI, HI and AVH currently.  She  agrees to notify staff immediately if this changes and she feels at risk for harming herself.   When asked what she  would like to gain from this admission she stated: stability.    [name] was compliant with scheduled medications.  PRNs: 650 mg Tylenol at 2035 for complaint of headache 6/10 pain, 10 mg propranolol at 1911, and 50 mg trazodone at 2033.    Urine specimen to be collected per MD order. EKG to be completed.     [name] is able to verbalize that she will remain safe at this time.  [place] Risk Level = High    [name], RN / Designee

## 2022-11-06 NOTE — Group Note (Signed)
Group Note    Patient Name: Sarah [name]    Date: 11/06/2022     Time: 1930     Group Type: Process    Group Name: Wrap-Up     Group Objective: To process the patient's day/evening, allow patient's to voice any questions/concerns, and reflect upon emotions to evaluate the progression. Patients are encouraged to reflect on their day and share positive experiences.       Attendance: Attended    Interactions: Interacted appropriately    Mood/Affect: Appropriate    Patient Participation:  Pt was active and engaged in group. Pt participated in group discussion and shared when appropriate.     Albesa Seen, MHS

## 2022-11-06 NOTE — Progress Notes (Signed)
[facility]   Inpatient Admission Assessment    Patient is a 37 year old [place] female who arrived for inpatient admission accompanied by her fiance at 1640. Patient is a Voluntary admission. She is reporting having a bipolar dx and says she is in a mixed episode. She stated she was fine, but then became anxious (10/10) and then started thinking about self harm, then having suicidal ideation. She saw her outpatient therapist today and they recommended she come to [facility] for further evaluation. She is currently denying SI/HI/AVH, but reports in 10 minutes she could start feeling suicidal and depressed. She states she sleeps 7-9 hours a night, but it is restless and she wakes up exhausted. She states for the last 2.5 weeks she has been eating non stop. She reports she was losing weight in a healthy way, but has gained it back due to binge eating. She is denying tobacco and substance use. She states she has been drinking alcohol more than normal, but still only a few times a month, LDU a few weeks ago. Patient's [place] Suicide Risk Assessment was high. Patient's violence risk assessment was low .Patient reports  through [facility].  Report was given to [facility], [place] at [phone]. Patient was taken to [facility] at 1815.    Name: Sarah [name]  MRN: [id number]  Admission date: (Not on file)      Healthcare Directives:     Advance Directive: Patient does not have advance directive  No Advanced Directive: Patient would NOT like information about advanced directives, forgoing or with-drawing life-sustaining treatment, and withholding resuscitative services  Healthcare Agent Appointed: No  Pre-existing DNR/DNI Order: No  Patient Requests Assistance: No      Notfiy per patient:            Admission Handbook:     Patient handbook provided and reviewed?: Yes      Vitals/ Pain:     Temp: 98.4 F (36.9 C)  Heart Rate: 81  Resp: 12  BP: 141/81  BP Location: Right forearm  BP Method: Automatic  Patient  Position: Sitting  O2 Flow Rate (L/min): 99 L/min  Height: 5' 6 (167.6 cm)  Weight: (!) 301 lb 1.6 oz (136.6 kg)  Weight Source: Standing Scale  BMI (Calculated): 48.7  Patient's Stated Pain Goal: No pain      [place] Suicide Severity Rating Scale - Screen Version Recent:     Have you wished you were dead or wished you could go to sleep and not wake up?: Yes  Have you actually had any thoughts of killing yourself?: Yes  Have you been thinking about how you might do this?: Yes  Have you had these thoughts and had some intention of acting on them?: Yes  Have you started to work out or worked out the details of how to kill yourself? Do you intend to carry out this plan?: No  Have you ever done anything, started to do anything, or prepared to do anything to end your life?: No    Assigned Risk: High Risk  High Risk Interventions: 15 minute checks at irregular intervals;Encourage attending DBT/CBT groups;Pharmacological Treatment;Encourage to attend groups to learn coping skills, stress management, symptome management;Family/Significant other engagement;Body/Belongings search        Mental Status Exam:     Apparent Age: Appears Actual Age  Hygiene/Grooming: Well Groomed  General Attitude: Cooperative  Motor Activity: Unremarkable  Eye Contact: Appropriate  Facial Expression: Anxious, Sad  Patient Behaviors: Hopeless, Cooperative,  Anxious  Speech Pattern: Within Defined Limits  Mood: Anxious, Depressed  Affect: Appropriate to Circumstances  Affect congruent with mood: Yes  Content: Blaming self  Delusions: Within Defined Limits  Perception: Appropriate  Hallucination: None  Thought content appropriate to situation: Yes  Danger to Others (WDL): Within Defined Limits  Thought process: Appropriate  Memory Impairment: None  Cognition: Appropriate for developmental age, Follows simple commands, Follows 2-step commands, Poor attention/concentration  Orientation Level: Oriented X4  Intelligence: Average  Attention Span: Easily  distracted  Insight: Average  Judgement: Average  Appetite Change: Increased  Do you have any sleep concerns?: Difficulty falling asleep/insomnia, Sleep disruption (Comment)  Libido: Normal        V-Risk-10:     V Risk 10 Screening  Previous and/ or current violence: No  Previous and/ or current threats (verbal/ physical): No  Previous and/ or current substance abuse: Maybe/ moderate  Previous and/ or current major mental illness: Yes  Personality Disorder: No  Shows lack of insight into illness and/ or behaviour: No  Expresses suspicion: No  Shows lack of empathy: No  Unrealistic planning: No  Future stress-situations: No    V Risk 10 Disposition  Overall Clinical Evaluation: Low  Suggestion following overall clinical evaluation: No more detailed violence risk assessment    Nutrition Screen:     Unplanned Weight Loss of 10 lbs or more in Last Three Months: No  Unplanned Weight gain of 10 lbs or more in Last Three Months: Yes (Comment)  Decrease in food intake and/or appetite: No  Food Allergies: No  Eating habits or behaviors that maybe indicators for an eating disorder: Yes (Comment) (binge eating for the last 2.5 weeks)  Dental Problems or Swallowing Issues: No  Enteral Nutrition: No  Pressure Ulcer or Non-Healing Wound: No      Patient Access Code:     Patient Access Code: [email or web address]  Patient Mental Health Legal Status: Voluntary      Patient Checks:     Visual Checks: Standard Q15  Arm Bands On: ID, Allergies  Patient Checked for Contraband: Belongings checked, Body checked, Clothing checked      Safety:     Recent Psychological Experiences: Conflict (Comment) (reports mixed episode of bipolar)  Depression rating:  (unable to rate at this time)  Anxiety rating: 10  Protective factors: Active and motivated in psych treatment  Self Injurious Thoughts: Without plan for self injurious actions  Self Injurious Behaviors: None observed  Family Suicide History: No  Thoughts of Harming Others: Denies  Do you have access to  weapons in the home?: Yes  What is your plan for securing the weapon(s)?: fiance present during assessment and reports he will secure firearms  History of Aggression or Violence: No history  Current Thoughts of Aggression: No  Does Patient's Family Have a History of Aggression or Violence?: No  Pt/Family Hx of Legal problems d/t aggression: No  Elopement risk?: No  Restraint Contraindications: Obesity  Restrained/secluded in past year?: No  If restrained/secluded, notify someone?: No  Methods to Calm Down: 1:1 Time, Quiet time in room      Disordered Eating:     Does Patient Have Unusual Eating Patterns?: Yes  Type of Eating Disorder: Binging (reports binging for the last 2.5 weeks)  Does Patient Eat Non Food Items?: No  Does Patient Have an Excessive Fluid Intake?: No  Does Patient Have an Excessive Calorie consumption or restriction?: No  Does Patient Have Unusual Dietary Preferences?: none reported  Physical Assessment:     Neurological: Normal  Sensory Impairment: Vision, Glasses  Cardiovascular: Normal  Respiratory: Normal  Musculoskeletal: Normal  Gastrointestinal: Normal  Genitourinary: Normal  Sexuality: Normal  Endocrine: Normal        Sleep Assessment:     Sleep Pattern: Disturbed/interrupted sleep  Average Number of Sleep Hours: 8 Hours  Restful Sleep: No (Comment)  Difficulty Falling Asleep: Yes (Comment)  Difficulty Staying Asleep: Yes (Comment)  Difficulty Arising: Yes (Comment)  Use of Sleep Aids: reports she has some medications, but she does not use them        Nghia Mcentee Larrie Kass, MSW  11/06/2022  6:22 PM

## 2022-11-07 MED ORDER — clonazePAM (klonoPIN) tablet 0.5 mg
0.5 | Freq: Three times a day (TID) | ORAL | PRN
Start: 2022-11-07 — End: 2022-11-13
  Administered 2022-11-08 – 2022-11-11 (×6): 0.5 mg via ORAL

## 2022-11-07 MED ORDER — propranoloL (INDERAL) tablet 10 mg
10 | Freq: Two times a day (BID) | ORAL
Start: 2022-11-07 — End: 2022-11-13
  Administered 2022-11-08 – 2022-11-13 (×12): 10 mg via ORAL

## 2022-11-07 MED ORDER — hydrOXYzine pamoate (VISTARIL) capsule 25 mg
25 | Freq: Three times a day (TID) | ORAL | PRN
Start: 2022-11-07 — End: 2022-11-07

## 2022-11-07 MED ORDER — busPIRone (BUSPAR) tablet 10 mg
10 | Freq: Two times a day (BID) | ORAL
Start: 2022-11-07 — End: 2022-11-10
  Administered 2022-11-07 – 2022-11-10 (×6): 10 mg via ORAL

## 2022-11-07 MED ORDER — ibuprofen (MOTRIN) tablet 400 mg
400 | Freq: Four times a day (QID) | ORAL | PRN
Start: 2022-11-07 — End: 2022-11-13
  Administered 2022-11-07 – 2022-11-13 (×5): 400 mg via ORAL

## 2022-11-07 MED FILL — OXCARBAZEPINE 300 MG TABLET: 300 300 MG | ORAL | Qty: 1

## 2022-11-07 MED FILL — CHOLECALCIFEROL (VITAMIN D3) 25 MCG (1,000 UNIT) TABLET: 1000 1000 units | ORAL | Qty: 2

## 2022-11-07 MED FILL — BUPROPION HCL 100 MG TABLET: 100 100 MG | ORAL | Qty: 1

## 2022-11-07 MED FILL — TRAZODONE 50 MG TABLET: 50 50 MG | ORAL | Qty: 1

## 2022-11-07 MED FILL — HYDROCHLOROTHIAZIDE 25 MG TABLET: 25 25 MG | ORAL | Qty: 1

## 2022-11-07 MED FILL — FLUOXETINE 20 MG CAPSULE: 20 20 MG | ORAL | Qty: 1

## 2022-11-07 MED FILL — BUSPIRONE 10 MG TABLET: 10 10 MG | ORAL | Qty: 1

## 2022-11-07 MED FILL — PROPRANOLOL 10 MG TABLET: 10 10 MG | ORAL | Qty: 1

## 2022-11-07 MED FILL — LATUDA 20 MG TABLET: 20 20 mg | ORAL | Qty: 1

## 2022-11-07 MED FILL — TYLENOL 325 MG TABLET: 325 325 mg | ORAL | Qty: 2

## 2022-11-07 MED FILL — IBUPROFEN 400 MG TABLET: 400 400 MG | ORAL | Qty: 1

## 2022-11-07 NOTE — Group Note (Signed)
Group Note    Patient Name: Sarah [name]    Date: 11/07/2022      Time: 0930     Group Type: CBT    Group Name: Grounding Techniques    Group Objective: To help control racing thoughts, anxiety, flashbacks and other similar uncomfortable perceptions by turning attention away from thoughts, memories, or worries and refocusing on the present moment.     Attendance: Attended    Interactions: Interacted appropriately    Mood/Affect: Appropriate    Patient Participation: [name] actively attended and participated in group. Patient engaged in discussion about group topics. Patient shared their examples with the group.       Drewey Begue, MHS

## 2022-11-07 NOTE — Plan of Care (Signed)
Problem: Risk for Suicidal Behavior  Description: Sarah [name] is at Risk for Suicidal Behaviors AEB thoughts of self harm, a history of bipolar disorder, and a current feelings of instability and a loss of control. CSSRS High.  Goal: Goal 1:STG/Objective  Description: Sarah [name] will verbalize at least 2 copings skills to use as alternatives for dealing with stress and emotional problems by 11/09/2022.      Outcome: Progressing   Sarah [name] presents as anxious with a responsive affect.  She rates anxiety 9/10 and depression 0/10.  Sarah [name] denies SI, HI and AVH currently.  She was observed talking with others in the open area. Sarah [name] attended and engaged in 3 unit programs.  She ate 50% of breakfast and 100% of lunch.  She was medication compliant.  PRNs: Ibuprofen @ 1021 for 8/10 headache    Sarah [name] is able to verbalize that they will remain safe at this time.  Based on the Treatment guidelines and the professional opinion of the RN and treatment team, she may have her own clothes, standard linens and all silverware with meals.  Sarah [name] Risk Level = Low    Sarah [name] is afebrile and asymptomatic for COVID-19 and is compliant with all necessary precautions.    Have you developed any new flu symptoms (cough, sore throat, runny nose, body aches) since your admissions that you did not have prior to admitting?  No  Sarah [name] of Sarah [name]   Inpatient Shift Assessment    Name: Sarah [name]  MRN: [id number]  Admission date: 11/06/2022        Vitals:     Temp: 96.8 F (36 C)  Heart Rate: 65  Resp: 16  BP: 149/89  BP Location: Left upper arm  BP Method: Automatic  Patient Position: Sitting  SpO2: 99 %  O2 Flow Rate (L/min): 99 L/min  Height: 5' 6 (167.6 cm)  Weight: (!) 301 lb 1.6 oz (136.6 kg)  Weight Source: Standing Scale  BMI (Calculated): 48.7      Pain/ Pain Reassessment:     Patient's Stated Pain Goal: No pain  Pain Score: 8          Intake:            Output:            POCT Glucose:            Sarah [name]  Suicide Severity Rating Scale - Frequent Screener:     Have you actually had any thoughts of killing yourself since the last time you were asked?: No  Have you done anything, started to do anything, or prepared to do anything to end your life?: No    Assigned Risk: High Risk  High Risk Interventions: 15 minute checks at irregular intervals;Reassess risk daily, upon status change and discharge;Pharmacological Treatment;Family/Significant other engagement;Encourage attending DBT/CBT groups;Encourage to attend groups to learn coping skills, stress management, symptome management        Mental Status Exam:     Apparent Age: Appears Actual Age  Hygiene/Grooming: Well Groomed  General Attitude: Cooperative;Pleasant  Motor Activity: Freedom of movement  Eye Contact: Appropriate  Facial Expression: Anxious  Patient Behaviors: Cooperative  Impulsivity: Normal  Speech Pattern: Within Defined Limits  Mood: Anxious  Affect: Responsive  Affect congruent with mood: Yes  Content: Within Defined Limits  Delusions: Within Defined Limits  Perception: Appropriate  Hallucination: None  Thought content appropriate to situation: Yes  Danger to Others (WDL): Within Defined Limits  Thought  process: Appropriate  Orientation Level: Oriented X4  Insight: Average  Judgement: Average           Edmonson Fall Risk     Age: Less than 50  Mental Status: Fully Alert/ Oriented at all times  Elimination: Independent with control of bowel/ bladder  Medication: Psychotropic medications ( including benzos and antidepressants)  Psych Diagnosis: Bipolar/ schizoafective disorder  Ambulation/ Balance: Independent/ Steady gait/ Immobile  Nutrition: No apparent abnormalities with appetite  Sleep Disturbance: Report of sleep disturbance by patient, family or staff  History of Falls: No history of falls  Secondary Diagnosis: No medical problems  Edmonson Fall Risk Score: 63             Patient Checks:     Interventions: Call bell within reach, Dayroom  observation, ID band on  Visual Checks: Standard Q15  Arm Bands On: ID, Allergies  Patient Checked for Contraband: Belongings checked, Body checked, Clothing checked        Safety:     Depression rating: 0  Anxiety rating: 9  Self Injurious Thoughts: Denies  Self Injurious Behaviors: None observed  Current Thoughts of Aggression: No  Elopement risk?: No  Methods to Calm Down: Quiet time in room;Change of environment        DASA     Irritablity: No  Verbal Threats: No  Impulsivity: No  Negative attitude: No  Unwillingness to follow direction: No  Sensitivity to perceived provocation: No  Easily angered when request denied: No  Total Score - DASA: 0      Withdrawl Symptoms:            Hygiene:     Level of Assistance: Independent      Nutrition Screen:     Feeding: Able to feed self  Diet Type: Regular  Appetite: Coralie Carpen, RN  11/07/2022  1:36 PM

## 2022-11-07 NOTE — Group Note (Signed)
Name: Sarah [name]    Date: 11/07/2022    Time: 12:15 PM    Group Type: Recreation Therapy    Group Name: Scattergories    Group Objective: Purpose of this group was to learn a new leisure pursuit, engage in socialization opportunities and to allow for quick and creative thinking during group.  Participants identified words starting with the same letter from a list of subjects.      Attendance: Attended    Interaction: Interacted appropriately     Mood/Affect: Appropriate    Participation: Pt engaged in RT group. Social and appropriate. Followed directions and learned a new game. Completed group without issue.       Jaquesha Boroff Slinger, CTRS

## 2022-11-07 NOTE — Group Note (Signed)
Group Note    Patient Name: Sarah [name]    Date: 11/07/2022     Time: 1930     Group Type: Process    Group Name: Wrap-Up     Group Objective: To process the patient's day/evening, allow patient's to voice any questions/concerns, and reflect upon emotions to evaluate the progression. Patients are encouraged to reflect on their day and share positive experiences.       Attendance: Attended    Interactions: Interacted appropriately    Mood/Affect: Appropriate    Patient Participation:  Pt was active/engaged in group and completed wrap-up sheet. Pt declined to share with peers. Pt rates anxiety 7/10 and depression 0/10. Pt denies SI/HI/AVH. Pt reports [place] her fiance.       Peggye Pitt, MHS

## 2022-11-07 NOTE — Group Note (Signed)
Name: Sarah [name]    Date: 11/07/2022    Time: 2:57 PM    Group Type: Recreation Therapy    Group Name: Leisure Ed     Group Objective: Group was offered to discuss the benefits of RT, positive coping skills, engaging in positive social interactions and ways to reduce stress and anxiety. Patients worked on creating a calendar to help establish a routine, reminders for self-care, appointments, birthdays, etc.     Attendance: Did not attend    Interaction: Did not interact    Mood/Affect: Unable to assess    Participation: Pt was observed with a visitor, and declined to attend group when prompted by staff.  A handout on the benefits of engaging in healthy leisure activities was made available to Pt. Pt was made aware of an opportunity to ask questions related to group material.       [name]  [facility]

## 2022-11-07 NOTE — Group Note (Signed)
Group Note    Patient Name: Sarah [name]    Date: 11/07/2022      Time: 0900     Group Type: Goal Setting    Group Name: Community    Group Objective: This clinician assessed patients' physical and mental wellness via group discussion and review of daily check in document. Clinician also assessed patients' for presence of suicidal and/or homicidal ideation, plans or intentions and addressed patient concerns. Unit schedule, rules and regulation were reviewed. In addition, group discussed the importance of hygiene as needed.      Attendance: Attended    Interactions: Interacted appropriately    Mood/Affect: Appropriate    Patient Participation: Pt was active and present in community goals group. They engaged in self-reflection and completed check-in worksheet. Pt shared amongst group members and/or listened to others. Pt created a SMART goal during group session. Did not state a goal for the day. Pt rates anxiety 8/10 and depression 0/10, with 10 being the highest. Pt endorses thoughts of self-harm, denies plan or intent at this time. Pt denies HI and AVH at this time and states that they can contract for safety.       Jennyfer Nickolson MHS
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
## 2022-11-07 NOTE — Group Note (Signed)
Group Note    Patient Name: Sarah [name]    Date: 11/07/2022      Time: 1615     Group Type: DBT- Mindfulness    Group Name: Intro to Mindfulness and Wise Mind    Group Objective: Gave the patients an introduction to mindfulness by teaching the goals of mindfulness(reduce suffering/increase happiness, increase control of your mind, and experience reality as it is), explained the different mindfulness skills(wisemind, what skill, and how skill), and provided examples of mindfulness practices(meditation, contemplative prayer, mindfulness movement). Further described the mindfulness skill of wise mind by teaching the difference between rational mind and emotion mind. Rational mind is task-focused, dismissive of our emotions and focuses on problem solving and achieving goals. Emotion mind crowds our reason and effectiveness by letting our emotions control our actions. Wise mind incorporates both reason and emotion for a successful and effective outcome. Asked pts to share examples in everyday life where they can practice using mindfulness and wise mind.      Attendance: Did not attend    Interactions: Did not interact    Mood/Affect: Unable to assess    Patient Participation: Sarah [name] encouraged to attend group but declined due to having a visitor at this time. Group materials were offered and given to patient. [facility]HS informed patient of opportunity to ask questions as well as offered 1:1 discussion.    Kathy Wahid, MHS
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
## 2022-11-07 NOTE — Plan of Care (Signed)
Problem: Risk for Suicidal Behavior  Description: Tateyana Diponio is at Risk for Suicidal Behaviors AEB thoughts of self harm, a history of bipolar disorder, and a current feelings of instability and a loss of control. CSSRS High.  Goal: Goal 1:STG/Objective  Description: Liler Panter will verbalize at least 2 copings skills to use as alternatives for dealing with stress and emotional problems by 11/09/2022.    Outcome: Progressing       Malani Cresswell presented as cooperative, with an animated facial expression and a responsive affect.  She rated her anxiety 4/10, depression 0/10, and denied SI/HI and AVH.  She contracted for safety.  She ate 100% of dinner and attended one group.  She was medication compliant.  PRN medications included 0.5 mg klonopin at 2038.  CSSRS High.    Daily Medication Education Questions:  Do you have questions about your medications? No  2.   Any questions about side effects and how to manage them? No  3.   Any questions on how you take your medications? No      [facility] [place] [facility]   Inpatient Shift Assessment    Name: Latoysha Wishard  MRN: [id number]  Admission date: 11/06/2022        Vitals:     Temp: 96.8 F (36 C)  Heart Rate: 76  Resp: 16  BP: 141/71  BP Location: Left upper arm  BP Method: Automatic  Patient Position: Sitting  SpO2: 99 %  O2 Flow Rate (L/min): 99 L/min  Height: 5' 6 (167.6 cm)  Weight: (!) 301 lb 1.6 oz (136.6 kg)  Weight Source: Standing Scale  BMI (Calculated): 48.7      Pain/ Pain Reassessment:     Pain Score: 0 - No Pain         Intake:            Output:            POCT Glucose:            [place] Suicide Severity Rating Scale - Frequent Screener:     Have you actually had any thoughts of killing yourself since the last time you were asked?: No  Have you done anything, started to do anything, or prepared to do anything to end your life?: No    Assigned Risk: High Risk  High Risk Interventions: 15 minute checks at irregular intervals;Reassess risk daily, upon status change  and discharge;Encourage to attend groups to learn coping skills, stress management, symptome management;Encourage attending DBT/CBT groups;Family/Significant other engagement;Pharmacological Treatment        Mental Status Exam:     Apparent Age: Appears Actual Age  Hygiene/Grooming: Well Groomed  General Attitude: Cooperative  Motor Activity: Freedom of movement  Eye Contact: Appropriate  Facial Expression: Animated  Patient Behaviors: Anxious;Cooperative  Impulsivity: Normal  Speech Pattern: Within Defined Limits  Mood: Anxious  Affect: Responsive  Affect congruent with mood: Yes  Content: Within Defined Limits  Delusions: Within Defined Limits  Perception: Appropriate  Hallucination: None  Thought content appropriate to situation: Yes  Danger to Others (WDL): Within Defined Limits  Thought process: Appropriate  Orientation Level: Oriented X4           [place] Fall Risk     Age: Less than 50  Mental Status: Fully Alert/ Oriented at all times  Elimination: Independent with control of bowel/ bladder  Medication: Psychotropic medications ( including benzos and antidepressants)  Psych Diagnosis: Bipolar/ schizoafective disorder  Ambulation/ Balance: Independent/ Steady  gait/ Immobile  Nutrition: No apparent abnormalities with appetite  Sleep Disturbance: Report of sleep disturbance by patient, family or staff  History of Falls: No history of falls  Secondary Diagnosis: No medical problems  [place] Fall Risk Score: 34             Patient Checks:     Interventions: Call bell within reach, Dayroom observation, ID band on  Visual Checks: Standard Q15  Arm Bands On: ID, Allergies  Patient Checked for Contraband: Belongings checked, Body checked, Clothing checked        Safety:     Depression rating: 0  Anxiety rating: 4  Self Injurious Thoughts: Denies  Self Injurious Behaviors: None observed  Current Thoughts of Aggression: No  Elopement risk?: No  Methods to Calm Down: Quiet time in room;Reading;PRN  medications        DASA     Irritablity: No  Verbal Threats: No  Impulsivity: No  Negative attitude: No  Unwillingness to follow direction: No  Sensitivity to perceived provocation: No  Easily angered when request denied: No  Total Score - DASA: 0      Withdrawl Symptoms:            Hygiene:     Level of Assistance: Independent      Nutrition Screen:     Feeding: Able to feed self  Diet Type: Regular  Appetite: Geralyn Corwin, RN  11/07/2022  10:05 PM
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
## 2022-11-07 NOTE — H&P (Deleted)
[place]  [facility]  Medical History & Physical    CPT Code: 16109    Sarah [name]  [id number]  11/06/2022  6:22 PM    Date of Evaluation: 11/07/2022   Reason for Admission: Anxiety and self-harm thoughts    HPI:    37 years old lady with prior medical history of severe obesity, hypertension, PCOS, chronic lower extremity lymphedema, prior history of asthma, anxiety who presents with depression, anxiety and recurrent thoughts of self-harm.  Patient has been admitted voluntarily to the [facility] for mood fluctuations and severe anxiety.  She has denied any active suicidal or homicidal ideation.  Reports no visual or auditory hallucinations.  Denies any physical complaints on my assessment.  Reports no fevers or chills.  Denies any headaches or dizziness.  Denies any chest pain or shortness of breath.  Denies any abdominal pain.  Reports no bowel or bladder difficulties.  Patient has been admitted to the [facility] for clinical stabilization given concerns for poorly controlled bipolar type I disorder and PTSD.    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    UTI (lower urinary tract infection) 01/20/2012       Past Surgical History:   Procedure Laterality Date    CHOLECYSTECTOMY      KNEE CARTILAGE SURGERY         Allergies   Allergen Reactions    Clindamycin      When took oral form throat swelled for a few days, but has taken since then with no reaction    Corticosteroids (Glucocorticoids)      Prednisone     Morphine Anxiety         Current Facility-Administered Medications:     busPIRone (BUSPAR) tablet 10 mg, 10 mg, Oral, BID, Anna Ward, CNP, 10 mg at 11/07/22 2038    cholecalciferol (vitamin D3) tablet 2,000 Units, 2,000 Units, Oral, Daily 0900, Sherrine Maples, MD, 2,000 Units at 11/07/22 0819    clonazePAM (klonoPIN) tablet 0.5 mg, 0.5 mg, Oral, TID PRN, Alinda Dooms, CNP, 0.5 mg at 11/07/22 2038    FLUoxetine (PROZAC) capsule 20 mg, 20 mg, Oral, Daily 0900, Sherrine Maples, MD, 20 mg at 11/07/22 0820    hydroCHLOROthiazide (HYDRODIURIL) tablet 25 mg, 25 mg, Oral, Daily 0900, Sherrine Maples, MD, 25 mg at 11/07/22 0820    ibuprofen (MOTRIN) tablet 400 mg, 400 mg, Oral, Q6H PRN, Alinda Dooms, CNP, 400 mg at 11/07/22 1021    lurasidone (LATUDA) tablet 60 mg, 60 mg, Oral, Daily 0900, Sherrine Maples, MD, 60 mg at 11/07/22 2038    metFORMIN (GLUCOPHAGE-XR) 24 hr tablet 750 mg, 750 mg, Oral, BID, Sherrine Maples, MD, 750 mg at 11/07/22 2042    OLANZapine (ZYPREXA) injection 10 mg, 10 mg, Intramuscular, UD PRN **AND** sterile water (  PF) Soln 2.1 mL, 2.1 mL, Injection, UD PRN, Sherrine Maples, MD    olanzapine zydis (ZYPREXA) disintegrating tablet 5 mg, 5 mg, Oral, Q4H PRN, Sherrine Maples, MD    OXcarbazepine (TRILEPTAL) tablet 300 mg, 300 mg, Oral, BID, Sherrine Maples, MD, 300 mg at 11/07/22 2038    polyethylene glycol (MIRALAX) packet 17 g, 17 g, Oral, Daily PRN, Sherrine Maples, MD    propranoloL (INDERAL) tablet 10 mg, 10 mg, Oral, BID, Alinda Dooms, CNP, 10 mg at 11/07/22 2038    traZODone (DESYREL) tablet 50 mg, 50 mg, Oral, Nightly PRN, Sherrine Maples, MD, 50 mg at 11/06/22 2033     Review of Systems:    General ROS: all negative  Psychological ROS: positive for - anxiety and depression  Ophthalmic ROS: negative  ENT ROS: all negative  Allergy and Immunology ROS: negative  Hematological and Lymphatic ROS: all negative  Endocrine ROS: all  negative  Breast ROS: negative  Respiratory ROS: all  negative  Cardiovascular ROS: all negative  Gastrointestinal ROS: all negative  Genito-Urinary ROS: no dysuria, trouble voiding, or hematuria  Musculoskeletal ROS: all negative  Neurological ROS: all negative  Dermatological ROS: all negative        Substance Use History:     Social History     Substance and Sexual Activity   Alcohol Use Yes    Comment:  social drinker with last drink 3 weeks ago       Social History     Tobacco Use   Smoking Status Never   Smokeless Tobacco Never       Social History     Substance and Sexual Activity   Drug Use No       Vitals:    11/06/22 1807 11/07/22 0802 11/07/22 2038   BP: 141/81 149/89 141/71   BP Location: Right forearm Left upper arm    Patient Position: Sitting Sitting    Pulse: 81 65 76   Resp: 12 16    Temp: 98.4 F (36.9 C) 96.8 F (36 C)    SpO2:  99%    Weight: (!) 301 lb 1.6 oz (136.6 kg)     Height: 5' 6 (1.676 m)         Physical Exam:    BP 141/71   Pulse 76   Temp 96.8 F (36 C)   Resp 16   Ht 5' 6 (1.676 m)   Wt (!) 301 lb 1.6 oz (136.6 kg)   LMP 10/30/2022 (Approximate)   SpO2 99%   BMI 48.60 kg/m      General Appearance:  In no distress  Pulm: Lungs clear to auscultation bilaterally  CV: S1/S2, No rub, No murmur and No gallop  GI: Non-tender, non-distended and Bowel sounds good  Extr: No clubbing, cyanosis or edema  Mental Status: Alertness:alert, Orientation:oriented to person, place, time, self, recent/remote memory normal, Attention normal and Language:language appropriate  CN II: visual field to confrontation within normal limits  CN III/IV/VI: Extraocular movements intact  and pupils equal, round, reactive to light with accommodation  CN V: Facial sensation normal in VI, VII, VII    CN VII: facial motion intact and symmetric  CN VIII: Hearing intact to finger rub  CN IX/X: palate elevation symmetric without abnormalities and gag reflex intact without abnormalities  CN XI: sternocleiomastoid 5/5 symmetric strength and trapezius 5/5 symmetric  strenghth  CN XII: Tongue protrudes midline  Motor: Bulk: normal  Tone: normal in all 4 extremities  No  atrophy  5/5 power in all 4 extremities  Sensory:sensation normal to light touch, vibration, proprioception bilaterally upper and lower extremities  Coord: no dysmetria or tremor noted  Gait: normal gait, normal heel work, normal toe work and normal tandem gait  Deep Tendon Reflexes: reflexes are symmetric bilaterally upper and lower extremities  Frontal Release Reflexes: Glabellar tap normal  Clonus: normal  Dysarthia:No evidence of Dysarthia  Additional Exam: no rash present      Labs:      Diagnosis:       1.  Bipolar disorder type I  2.  PTSD  3.  Generalized anxiety disorder  Per Psychiatry team's plan of care.    4.  PCOS  Patient will continue with metformin 750 mg twice daily.    5.  Hypertension  Continues with hydrochlorothiazide 25 mg daily, propranolol/Inderal 10 mg twice daily.   Mildly elevated BP at the time of admission. Will monitor for now.     Assessment and Plan:       In summary, 37 years old lady with history of mood disorder who presents with poorly controlled bipolar disorder type I, PTSD, severe anxiety.  Patient was admitted for clinical stabilization to the [facility].  No active medical concerns are present at this time.  Clinically and hemodynamically stable.  Physical exam was without significant abnormalities.  Neurologically nonfocal.  Will monitor and follow-up with Psychiatry for any clinical concerns occur during this hospitalization.    Lillia Abed, MD  11/07/2022 9:27 PM

## 2022-11-07 NOTE — Plan of Care (Signed)
Problem: Risk of Suicidal Behavior  Intervention: Intervention  Description: SW will input outpatient appointments for patient in their AVS and review with them    Responsible staff: Kerry Dory, MSW, LSW/designee    Note: Initial tx plan signed.     SW attempted call to psych provider but unable to get a hold of someone nor leave a msg.     SW called husband for collateral. Husband reported that pt is a good historian and likely expressed everything that is going on well.     SW provided contact info and discussed collaboration for discharge planning.
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
## 2022-11-08 LAB — LCOH COMPLETE BLOOD COUNT W/ DIFF
Basophils Absolute: 0.1 10*3/uL (ref 0.0–0.3)
Basophils Relative: 1 % (ref 0.0–2.0)
Eosinophils Absolute: 0.1 10*3/uL (ref 0.0–0.5)
Eosinophils Relative: 1.8 % (ref 0.0–5.0)
Hematocrit: 38.1 % (ref 34.0–49.0)
Hemoglobin: 12.9 G/DL (ref 11.2–15.7)
Immature Granulocytes (%): 0.4 % (ref <1)
Immature Granulocytes Absolute: 0 10*3/uL (ref 0.0–0.1)
Lymphocytes Absolute: 1.6 10*3/uL (ref 0.9–4.1)
Lymphocytes Relative: 20.3 % (ref 14.0–51.0)
MCH: 30.1 PG (ref 26.0–34.0)
MCHC: 33.9 G/DL (ref 30.7–35.5)
MCV: 89 FL (ref 80.0–100.0)
MPV: 11.4 FL (ref 7.2–11.7)
Monocytes Absolute: 0.4 10*3/uL (ref 0.2–1.0)
Monocytes Relative: 5.7 % (ref 4.0–12.0)
Neutrophils Absolute: 5.5 10*3/uL (ref 1.8–7.5)
Neutrophils Relative: 70.8 % (ref 42.0–80.0)
Platelets: 318 10*3/uL (ref 140–400)
RBC: 4.28 M/uL (ref 3.95–5.26)
RDW: 13.3 % (ref <15.1)
WBC: 7.7 10*3/uL (ref 3.5–10.9)
nRBC: 0 /100 WBC

## 2022-11-08 LAB — COMPREHENSIVE METABOLIC PANEL
A/G Ratio: 2 RATIO (ref 0.8–2.6)
ALT: 21 U/L (ref 0–60)
AST: 15 U/L (ref 0–55)
Albumin: 4.2 G/DL (ref 3.5–5.2)
Alkaline Phosphatase: 121 U/L (ref 23–144)
BUN/Creatinine Ratio: 11 (ref 7–25)
BUN: 8 MG/DL (ref 3–29)
CO2: 26 MEQ/L (ref 19–32)
Calcium: 9.1 MG/DL (ref 8.5–10.5)
Chloride: 101 MEQ/L (ref 96–110)
Creatinine: 0.7 MG/DL (ref 0.5–1.2)
Globulin, Total: 2.1 G/DL (ref 1.9–3.6)
Glomerular Filtration Rate (GFR): 116 mL/min/{1.73_m2} (ref >60)
Glucose: 85 MG/DL (ref 70–99)
Potassium: 4.3 MEQ/L (ref 3.4–5.3)
Sodium: 139 MEQ/L (ref 135–148)
Total Bilirubin: 0.5 MG/DL (ref 0.0–1.2)
Total Protein: 6.3 G/DL (ref 6.0–8.3)

## 2022-11-08 LAB — LCOH-QUALITATIVE DRUG SCR
Amphetamine, UR: NOT DETECTED
Barbiturate, UR: NOT DETECTED
Benzodiazepine UR: NOT DETECTED
Buprenorphine, Urine: NOT DETECTED
Cocaine (Metabolite), UR: NOT DETECTED
Creatinine, Random Urine: 25.1 MG/DL (ref >25)
Fentanyl, Ur: NOT DETECTED
Marijuana, Qual: NOT DETECTED
Methadone Metabolite (EDDP): NOT DETECTED
Methadone, UR: NOT DETECTED
Opiates UR: NOT DETECTED
Oxidant: NEGATIVE
Oxycodone UR: NOT DETECTED
Specific Gravity, Urine: 1.005 (ref 1.003–1.030)
Tricyclic Antidepressant, UR: NOT DETECTED
pH, Urine: 6.9 (ref 5.0–9.0)

## 2022-11-08 LAB — URINALYSIS W/RFL TO MICROSCOPIC
Bilirubin, UA: NEGATIVE MG/DL
Blood, UA: NEGATIVE MG/DL
Glucose, UA: NEGATIVE MG/DL
Ketones, UA: NEGATIVE MG/DL
Leukocyte Esterase, UA: NEGATIVE
Nitrite, UA: NEGATIVE
Protein, UA: NEGATIVE MG/DL
Specific Gravity, UA: 1.006 (ref 1.005–1.030)
Urobilinogen, UA: <2 MG/DL (ref 0–2)
pH, UA: 7 (ref 4.5–8.0)

## 2022-11-08 LAB — HEMOGLOBIN A1C: Hemoglobin A1C: 5 % (ref 4.0–6.0)

## 2022-11-08 LAB — TSH: TSH: 1.57 MCIU/ML (ref 0.400–4.500)

## 2022-11-08 LAB — FOLATE: Folic Acid: 8 NG/ML (ref >3.3)

## 2022-11-08 LAB — LIPID PANEL
Cholesterol, Total: 188 MG/DL (ref <200)
HDL: 43 MG/DL — ABNORMAL LOW (ref >59)
LDL Cholesterol: 101 MG/DL — ABNORMAL HIGH (ref <100)
Triglycerides: 219 MG/DL — ABNORMAL HIGH (ref <150)
VLDL Cholesterol Cal: 44 MG/DL — ABNORMAL HIGH (ref 4–30)

## 2022-11-08 LAB — LCOH URINE BETA-HCG: Beta Hcg: NEGATIVE

## 2022-11-08 LAB — VITAMIN B12: Vitamin B-12: 512 PG/ML (ref 200–1100)

## 2022-11-08 LAB — HCG, QUANTITATIVE, PREGNANCY: hCG Quant: <5 m[IU]/mL (ref <5)

## 2022-11-08 LAB — VITAMIN D 25 HYDROXY: Vit D, 25-Hydroxy: 19 NG/ML — ABNORMAL LOW (ref 30–100)

## 2022-11-08 MED FILL — CLONAZEPAM 0.5 MG TABLET: 0.5 0.5 MG | ORAL | Qty: 1

## 2022-11-08 MED FILL — HYDROCHLOROTHIAZIDE 25 MG TABLET: 25 25 MG | ORAL | Qty: 1

## 2022-11-08 MED FILL — PROPRANOLOL 10 MG TABLET: 10 10 MG | ORAL | Qty: 1

## 2022-11-08 MED FILL — OXCARBAZEPINE 300 MG TABLET: 300 300 MG | ORAL | Qty: 1

## 2022-11-08 MED FILL — LATUDA 20 MG TABLET: 20 20 mg | ORAL | Qty: 1

## 2022-11-08 MED FILL — CHOLECALCIFEROL (VITAMIN D3) 25 MCG (1,000 UNIT) TABLET: 1000 1000 units | ORAL | Qty: 2

## 2022-11-08 MED FILL — BUSPIRONE 10 MG TABLET: 10 10 MG | ORAL | Qty: 1

## 2022-11-08 MED FILL — FLUOXETINE 20 MG CAPSULE: 20 20 MG | ORAL | Qty: 1

## 2022-11-08 MED FILL — IBUPROFEN 400 MG TABLET: 400 400 MG | ORAL | Qty: 1

## 2022-11-08 NOTE — Group Note (Signed)
Group Note    Patient Name: Sarah [name]    Date: 11/08/2022      Time: 1030     Group Type: Social Work    Group Name: Perspectives    Group Objective:Patients will engage in a group discussion about different metaphors about what you would rather be? Patients will explore different perspectives and learn about their own individual strengths. Patients will engage in an experiential activity to create their own metaphor that is meaningful for their recovery. Patients will be presented with the opportunity to share with their peers.     Attendance: Attended    Interactions: Interacted appropriately    Mood/Affect: Appropriate    Patient Participation: Patient appropriately participated and engaged in group activity and discussion. Pt presented with a depressed mood and a congruent affect.    Kerry Dory, MSW, LSW

## 2022-11-08 NOTE — Nursing Note (Signed)
Pt asleep at 2145 and appeared to sleep 8 hours this night shift. Q15 minute safety checks maintained throughout shift per unit protocol. PRN None.

## 2022-11-08 NOTE — Group Note (Signed)
Group Note    Patient Name: Sarah [name]    Date: 11/08/2022      Time: 1930     Group Type: Process Group    Group Name: Wrap Up Group    Group Objective:  To process the days events and allow patients to share.     Attendance: Attended    Interactions: Interacted appropriately    Mood/Affect: Appropriate    Patient Participation: Sarah [name] attended group and interacted appropriately. Sarah [name] rated anxiety 6/10 and depression 1/10 (10 being high). Sarah [name] denies SI, HI, and AVH at this time. She can come to staff with concerns.    Sharma Lawrance, MHS
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
## 2022-11-08 NOTE — Group Note (Signed)
Group Note    Patient Name: Sarah [name]    Date: 11/08/2022      Time: 1615     Group Type: DBT- Mindfulness    Group Name: What    Group Objective: Reviewed the goals of mindfulness and what mindfulness is. Introduced the two components of mindfulness;  what (awareness) and how (acceptance). Taught the three what skills; observe, describe, and participate. Provided ideas for practicing observing, describing, and participating. Engaged patients in a group describing activity.        Attendance: Did not attend    Interactions: Did not interact    Mood/Affect: Unable to assess    Patient Participation: [name] encouraged to attend group but declined due to having a visitor at this time. Group materials were offered and given to patient. MHS informed patient of opportunity to ask questions as well as offered 1:1 discussion.    Raahim Shartzer, MHS

## 2022-11-08 NOTE — Plan of Care (Signed)
Problem: Risk for Suicidal Behavior  Description: Sarah [name] is at Risk for Suicidal Behaviors AEB thoughts of self harm, a history of bipolar disorder, and a current feelings of instability and a loss of control. CSSRS High.  Goal: Goal 1:Long Term Goal  Description: Sarah [name] will verbalize knowledge of self-destructive behavior(s), other psychiatric problems, and safe use of medication by time of discharge from [facility].    Outcome: Progressing  Goal: Goal 1:STG/Objective  Description: Sarah [name] will verbalize at least 2 copings skills to use as alternatives for dealing with stress and emotional problems by 11/09/2022.    Outcome: Progressing     Pt presents as anxious with a responsive affect.  Pt rates anxiety 8/10 and depression 0/10.  Pt denies SI, HI and AVH currently.  Pt was observed to be sitting and staring in the milieu. She was encouraged to journal thoughts, feelings, and triggers as a way to help identify and cope with her sudden episodes of low energy and sadness.  Pt attended and engaged in wrap up unit programs.  Pt ate 90% of dinner.  Pt was medication compliant.  PRNs:  klonopin @2036 .          Pt is able to verbalize that they will remain safe at this time.  Based on the Treatment guidelines and the professional opinion of the RN and treatment team, they may have their own clothes, standard linens and all silverware with meals.  [place] Risk Level = Low.    Daily Medication Education Questions:  Do you have questions about your medications? No  2.   Any questions about side effects and how to manage them? No  3.   Any questions on how you take your medications? No    [facility]   Inpatient Shift Assessment    Name: Sarah [name]  MRN: [id number]  Admission date: 11/06/2022        Vitals:     Temp: 96.8 F (36 C)  Temp Source: Temporal  Heart Rate: 84  Resp: 15  BP: 130/60  BP Location: Left upper arm  BP Method: Automatic  Patient Position: Sitting  SpO2: 99 %  O2 Flow Rate (L/min): 99  L/min  Height: 5' 6 (167.6 cm)  Weight: (!) 301 lb 1.6 oz (136.6 kg)  Weight Source: Standing Scale  BMI (Calculated): 48.7      Pain/ Pain Reassessment:               Intake:            Output:            POCT Glucose:            [place] Suicide Severity Rating Scale - Frequent Screener:     Have you actually had any thoughts of killing yourself since the last time you were asked?: No  Have you done anything, started to do anything, or prepared to do anything to end your life?: No    Assigned Risk: High Risk  High Risk Interventions: 15 minute checks at irregular intervals;Reassess risk daily, upon status change and discharge;Pharmacological Treatment;Encourage attending DBT/CBT groups;Encourage to attend groups to learn coping skills, stress management, symptome management        Mental Status Exam:     Apparent Age: Appears Actual Age  Hygiene/Grooming: Well Groomed  General Attitude: Cooperative;Pleasant  Motor Activity: Freedom of movement  Eye Contact: Appropriate  Facial Expression: Anxious  Patient Behaviors: Anxious;Cooperative  Impulsivity: Normal  Speech Pattern: Within Defined Limits  Mood: Anxious  Affect: Responsive  Affect congruent with mood: Yes  Content: Within Defined Limits  Delusions: Within Defined Limits  Perception: Appropriate  Hallucination: None  Thought content appropriate to situation: Yes  Danger to Others (WDL): Within Defined Limits  Thought process: Appropriate  Orientation Level: Oriented X4  Insight: Average  Judgement: Impaired           [place] Fall Risk     Age: Less than 50  Mental Status: Fully Alert/ Oriented at all times  Elimination: Independent with control of bowel/ bladder  Medication: Psychotropic medications ( including benzos and antidepressants)  Psych Diagnosis: Bipolar/ schizoafective disorder  Ambulation/ Balance: Independent/ Steady gait/ Immobile  Nutrition: No apparent abnormalities with appetite  Sleep Disturbance: Report of sleep disturbance by patient,  family or staff  History of Falls: No history of falls  Secondary Diagnosis: No medical problems  [place] Fall Risk Score: 57             Patient Checks:     Interventions: Call bell within reach, Dayroom observation, ID band on  Visual Checks: Standard Q15  Arm Bands On: ID, Allergies  Patient Checked for Contraband: Belongings checked, Body checked, Clothing checked        Safety:     Depression rating: 0  Anxiety rating: 8  Self Injurious Thoughts: Denies  Self Injurious Behaviors: None observed  Thoughts of Harming Others: Denies  Current Thoughts of Aggression: No  Elopement risk?: No  Methods to Calm Down: Change of environment        DASA     Irritablity: No  Verbal Threats: No  Impulsivity: No  Negative attitude: No  Unwillingness to follow direction: No  Sensitivity to perceived provocation: No  Easily angered when request denied: No  Total Score - DASA: 0      Withdrawl Symptoms:            Hygiene:     Level of Assistance: Independent      Nutrition Screen:     Feeding: Able to feed self  Diet Type: Regular  Appetite: Good        Haile Toppins, RN  11/08/2022  9:24 PM

## 2022-11-08 NOTE — Progress Notes (Signed)
Pt appeared asleep at 2300. As of 0600, pt appeared to sleep 7 hours this shift. No issues reported or noted at this time. Q 15 minute checks maintained throughout shift per unit protocol.

## 2022-11-08 NOTE — Plan of Care (Signed)
[facility]   Inpatient Shift Assessment    I have personally reviewed and agree with the documentation and notes by Sarah V. There are no additions, deletions or changes to be made. Sarah [name] was medication compliant. PRNs: Ibuprofen and Klonopin @ 1101      Sarah [name] is able to verbalize that they will remain safe at this time. Based on the Treatment guidelines and the professional opinion of the RN and treatment team, she may have her own clothes, standard linens and all silverware with meals.  Sarah [name] Risk Level = Moderate    Sarah [name] is afebrile and asymptomatic for COVID-19 and is compliant with all necessary precautions.    Have you developed any new flu symptoms (cough, sore throat, runny nose, body aches) since your admissions that you did not have prior to admitting?  No    Problem: Risk for Suicidal Behavior  Description: Sarah [name] is at Risk for Suicidal Behaviors AEB thoughts of self harm, a history of bipolar disorder, and a current feelings of instability and a loss of control. CSSRS High.  Goal: Goal 1:STG/Objective  Description: Sarah [name] will verbalize at least 2 copings skills to use as alternatives for dealing with stress and emotional problems by 11/09/2022.    Intervention: Intervention C  Description: Unit staff will support Sarah [name] efforts to remain out of their room, to interact with other patients, or to attend activities.       Responsible staff: Sarah Must., RN  Note: Pt was presented and engaged in the therapeutic milieu most of this shift. Pt attended and engaged in all unit programming. Pt reported her anxiety was a 6 out of 10 and her depression was a 0 out of 10. Pt ate 60% of her breakfast and 100% of her lunch. Pt denied SI/HI/AVH. Pt will continued to be monitored per unit protocol.                Name: Sarah [name]  MRN: [id number]  Admission date: 11/06/2022        Vitals:     Temp: 96.8 F (36 C)  Temp Source: Temporal  Heart Rate: 75  Resp: 15  BP: 142/77  BP  Location: Left upper arm  BP Method: Automatic  Patient Position: Sitting  SpO2: 99 %  O2 Flow Rate (L/min): 99 L/min  Height: 5' 6 (167.6 cm)  Weight: (!) 301 lb 1.6 oz (136.6 kg)  Weight Source: Standing Scale  BMI (Calculated): 48.7      Pain/ Pain Reassessment:               Intake:            Output:            POCT Glucose:            Sarah [name] Suicide Severity Rating Scale - Frequent Screener:                   Mental Status Exam:     Apparent Age: Appears Actual Age  Hygiene/Grooming: Well Groomed  General Attitude: Cooperative  Motor Activity: Freedom of movement  Eye Contact: Appropriate  Facial Expression: Animated  Patient Behaviors: Anxious;Cooperative  Impulsivity: Normal  Speech Pattern: Within Defined Limits  Mood: Anxious  Affect: Responsive  Affect congruent with mood: Yes  Content: Within Defined Limits  Delusions: Within Defined Limits  Perception: Appropriate  Hallucination: None  Thought content appropriate to situation: Yes  Danger to Others (  WDL): Within Defined Limits  Thought process: Appropriate  Orientation Level: Oriented X4           [place] Fall Risk     Age: Less than 50  Mental Status: Fully Alert/ Oriented at all times  Elimination: Independent with control of bowel/ bladder  Medication: Psychotropic medications ( including benzos and antidepressants)  Psych Diagnosis: Bipolar/ schizoafective disorder  Ambulation/ Balance: Independent/ Steady gait/ Immobile  Nutrition: No apparent abnormalities with appetite  Sleep Disturbance: Report of sleep disturbance by patient, family or staff  History of Falls: No history of falls  Secondary Diagnosis: No medical problems  [place] Fall Risk Score: 55             Patient Checks:     Interventions: Call bell within reach  Visual Checks: Standard Q15  Arm Bands On: ID, Allergies  Patient Checked for Contraband: Belongings checked, Body checked, Clothing checked        Safety:     Depression rating: 0  Anxiety rating: 6  Self Injurious  Thoughts: Denies  Self Injurious Behaviors: None observed  Current Thoughts of Aggression: No  Elopement risk?: No        DASA     Irritablity: No  Verbal Threats: No  Impulsivity: No  Negative attitude: No  Unwillingness to follow direction: No  Sensitivity to perceived provocation: No  Easily angered when request denied: No  Total Score - DASA: 0      Withdrawl Symptoms:            Hygiene:            Nutrition Screen:              Sarah [name], LSW  11/08/2022  10:51 AM

## 2022-11-08 NOTE — Group Note (Cosign Needed)
Name: Sarah [name]    Date: 11/08/2022    Time: 12:29 PM    Group Type: Recreation Therapy    Group Name: Outdoor Leisure    Group Objective: Pts spent group outside in the courtyard to learn and experience the benefits of outdoor leisure.  Staff provided education on the physical, mental, and emotional health benefits of being outside, citing articles from APA and [employer] [facility].  Pts were provided with activities to do outside (bubbles, chalk, coloring, music), and prompted to think critically about ways they can increase outdoor leisure in their lives.    Attendance: Attended    Interaction: Interacted appropriately     Mood/Affect: Appropriate    Participation: Pt attended and participated in the group.  Pt was attentive during the discussion and used chalk while in group.  Pt stated that she liked to go on walks outside, and would sit on her deck with her fiance just to talk and socialize.      Kilie Rund, CRT

## 2022-11-08 NOTE — Group Note (Signed)
Group Note    Patient Name: Sarah [name]    Date: 11/08/2022      Time: 0900     Group Type: Process    Group Name: Community    Group Objective: Patients identify what goal they would like to achieve for the day and talk about how they are feeling.     Attendance: Attended    Interactions: Interacted appropriately    Mood/Affect: Appropriate    Patient Participation: Pt attended and engaged in group. Pt completed the sheet.    Lelend Heinecke, LSW

## 2022-11-09 MED FILL — LATUDA 20 MG TABLET: 20 20 mg | ORAL | Qty: 1

## 2022-11-09 MED FILL — PROPRANOLOL 10 MG TABLET: 10 10 MG | ORAL | Qty: 1

## 2022-11-09 MED FILL — CHOLECALCIFEROL (VITAMIN D3) 25 MCG (1,000 UNIT) TABLET: 1000 1000 units | ORAL | Qty: 2

## 2022-11-09 MED FILL — HYDROCHLOROTHIAZIDE 25 MG TABLET: 25 25 MG | ORAL | Qty: 1

## 2022-11-09 MED FILL — CLONAZEPAM 0.5 MG TABLET: 0.5 0.5 MG | ORAL | Qty: 1

## 2022-11-09 MED FILL — OXCARBAZEPINE 300 MG TABLET: 300 300 MG | ORAL | Qty: 1

## 2022-11-09 MED FILL — BUSPIRONE 10 MG TABLET: 10 10 MG | ORAL | Qty: 1

## 2022-11-09 MED FILL — IBUPROFEN 400 MG TABLET: 400 400 MG | ORAL | Qty: 1

## 2022-11-09 MED FILL — FLUOXETINE 20 MG CAPSULE: 20 20 MG | ORAL | Qty: 1

## 2022-11-09 NOTE — Progress Notes (Signed)
Spiritual Initial Assessment   [facility]    Reason for visit:  Patient request per SW Assessment.       Faith/Belief/Meaning    Spiritual background:Christian and Spiritual Not Religious    What gives life meaning: Faith in God    Availability, Accessibility, Applicability  Access to spiritual /religious/philosophical resources: Yes    Coping or Comfort    What practices, if any, help patient normally: Prayer    Is belief helping with coping and/or providing comfort: Not currently      Visit summary: Patient shared that she believes in God but does not have a faith community. She describes herself as Spiritual Not Religious. She expressed her doubts and disappointments with God, especially when her dad died of cancer two years ago and now as she suffers with bipolar. She expressed her feelings and her exhaustion. These feelings were normalized and validated with empathy and active listening.      Recommendations for Spiritual Care  ___Patient is coping well. No further support needed.  ___Patient is coping poorly. (Explore spiritual and DBT/ACT coping strategies)  ___Patient has a spiritual struggle. (Care plan needed)  ___Patient is exploring spiritually (provide resources - Care plan optional)  ___Patient is uncertain what they belief (provide spiritual assessments - Care plan optional)  ___Patient does not belief they are spiritual in nature.  (education)  _x__Other__Patient is aware of ongoing availability of Spiritual Care.    Janeece Fitting, MA, [facility]

## 2022-11-09 NOTE — Nursing Note (Incomplete)
Pt asleep at *** and appeared to sleep *** hours this night shift. Q15 minute safety checks maintained throughout shift per unit protocol. PRN ***

## 2022-11-09 NOTE — Group Note (Signed)
Group Note    Patient Name: Sarah [name]    Date: 11/09/2022      Time: 0930      Group Type: CBT    Group Name: I Statements vs. You Statements    Group Objective: Educate patients on what I statements are and how they affect confrontation in relationships. Practice applying the technique in real-life situations and example statements.     Attendance: Attended    Interactions: Interacted appropriately    Mood/Affect: Appropriate    Patient Participation: Ceceila attended the group and provided input consistent with the objective.  The pt engaged in discussion about the difference between I and You statements and why this difference matters.     Bellagrace Sylvan, LSW, M[facility]

## 2022-11-09 NOTE — Plan of Care (Addendum)
Problem: Risk for Suicidal Behavior  Description: Sarah [name] is at Risk for Suicidal Behaviors AEB thoughts of self harm, a history of bipolar disorder, and a current feelings of instability and a loss of control. CSSRS High.  Goal: Goal 1:Long Term Goal  Description: Sarah [name] will verbalize knowledge of self-destructive behavior(s), other psychiatric problems, and safe use of medication by time of discharge from [facility].    Outcome: Progressing  Goal: Goal 1:STG/Objective  Description: Sarah [name] will verbalize at least 2 copings skills to use as alternatives for dealing with stress and emotional problems by 11/09/2022.    Outcome: Progressing     Pt presents as anxious with a responsive affect.  Pt rates anxiety 6/10 and depression 1/10.  Pt denies SI, HI and AVH currently.  Pt was observed active and social in the milieu.  Pt attended and engaged in DBT and wrap up unit programs.  Pt ate 85% of dinner.  Pt was medication compliant.  PRNs:  Motrin @1741 . Trazodone @2216  requested for sleep.    Pt is able to verbalize that they will remain safe at this time.  Based on the Treatment guidelines and the professional opinion of the RN and treatment team, they may have their own clothes, standard linens and all silverware with meals.  [place] Risk Level = Low.    At the beginning of the shift the pt was anxious on her current mental health and why she is feeling down and depressed. She appeared to be fixated on the fact that she has mood swings from feeling sad to energetic throughout the day. At ~2000, the pt and her mom came to this RN to report that the pt suddenly became manic. The pt stated that she was feeling great. Her thoughts were not observed to be disorganized or rapid, and her speech was not rapid, pressured, or excessively loud. She was pacing and did appear irritable. She later began refusing medications saying that she likes the way she is feeling now and doesn't want her medications to make her feel  down again. But then she later said that her medications weren't working so she had no reason to take them. She added that she didn't like how latuda made her gain weight. She was educated to not abruptly stop taking these medications as she can have serious side effects, and she was amendable to take them. However, when she was asked to show her oral cavity to prove that she swallowed her medications, she refused, but was easily amendable to showing that she took it which she did.     Addendum: Per M[facility]/M[facility] report, the pt's thoughts did not overtly appear to disorganized. They did say the pt's thoughts had a slight flight of ideas. The pt was seen dancing in the milieu.    Daily Medication Education Questions:  Do you have questions about your medications? No  2.   Any questions about side effects and how to manage them? No  3.   Any questions on how you take your medications? No    [facility]   Inpatient Shift Assessment    Name: Sarah [name]  MRN: [id number]  Admission date: 11/06/2022        Vitals:     Temp: 97.2 F (36.2 C)  Temp Source: Temporal  Heart Rate: 68  Resp: 16  BP: 134/57  BP Location: Left upper arm  BP Method: Automatic  Patient Position: Sitting  SpO2: 99 %  O2 Flow  Rate (L/min): 99 L/min  Height: 5' 6 (167.6 cm)  Weight: (!) 301 lb 1.6 oz (136.6 kg)  Weight Source: Standing Scale  BMI (Calculated): 48.7      Pain/ Pain Reassessment:     Pain Score: 7   Pain Location: Head  Pain Orientation: Anterior  Pain Descriptors: Headache  Pain Intervention(s): Medication (See eMAR)         Intake:            Output:            POCT Glucose:            [place] Suicide Severity Rating Scale - Frequent Screener:     Have you actually had any thoughts of killing yourself since the last time you were asked?: No  Have you done anything, started to do anything, or prepared to do anything to end your life?: No    Assigned Risk: Low Risk  Low Risk Interventions: 15 minute checks at irregular  intervals;Reassess risk daily, upon status change and discharge;Pharmacological Treatment;Encourage attending DBT/CBT groups;Encourage to attend groups to learn coping skills, stress management, symptome management        Mental Status Exam:     Apparent Age: Appears Actual Age  Hygiene/Grooming: Well Groomed  General Attitude: Cooperative;Pleasant  Motor Activity: Restlessness  Eye Contact: Appropriate  Facial Expression: Anxious  Patient Behaviors: Anxious  Impulsivity: Normal  Speech Pattern: Within Defined Limits  Mood: Anxious  Affect: Responsive  Affect congruent with mood: Yes  Content: Within Defined Limits  Delusions: Within Defined Limits  Perception: Appropriate  Hallucination: None  Thought content appropriate to situation: Yes  Danger to Others (WDL): Within Defined Limits  Thought process: Appropriate  Orientation Level: Oriented X4  Insight: Impaired  Judgement: Impaired           [place] Fall Risk     Age: Less than 50  Mental Status: Fully Alert/ Oriented at all times  Elimination: Independent with control of bowel/ bladder  Medication: Psychotropic medications ( including benzos and antidepressants)  Psych Diagnosis: Bipolar/ schizoafective disorder  Ambulation/ Balance: Independent/ Steady gait/ Immobile  Nutrition: No apparent abnormalities with appetite  Sleep Disturbance: Report of sleep disturbance by patient, family or staff  History of Falls: No history of falls  Secondary Diagnosis: No medical problems  [place] Fall Risk Score: 10             Patient Checks:     Interventions: Call bell within reach, Dayroom observation, ID band on  Visual Checks: Standard Q15  Arm Bands On: ID, Allergies  Patient Checked for Contraband: Belongings checked, Body checked, Clothing checked        Safety:     Depression rating: 1  Anxiety rating: 6  Self Injurious Thoughts: Denies  Self Injurious Behaviors: None observed  Thoughts of Harming Others: Denies  Current Thoughts of Aggression: No  Elopement  risk?: No  Methods to Calm Down: Change of environment        DASA     Irritablity: No  Verbal Threats: No  Impulsivity: No  Negative attitude: No  Unwillingness to follow direction: No  Sensitivity to perceived provocation: No  Easily angered when request denied: No  Total Score - DASA: 0      Withdrawl Symptoms:            Hygiene:     Level of Assistance: Independent      Nutrition Screen:     Feeding: Able to  feed self  Diet Type: Regular  Appetite: Good        Ariyona Eid, RN  11/09/2022  10:07 PM

## 2022-11-09 NOTE — Group Note (Cosign Needed)
Name: Sarah [name]    Date: 11/09/2022    Time: 4:49 PM    Group Type: Recreation Therapy    Group Name: Leisure Ed: Boggle    Group Objective: Pts will learn and play the word game, Boggle.  Staff will provide education on using games like Boggle to exercise cognitive skills, build up mental flexibility, and socialize.  The goal of the group is for pts to engage in positive leisure while playing Boggle.    Attendance: Attended    Interaction: Interacted appropriately     Mood/Affect: Appropriate    Participation: Pt attended and participated in the group. Pt played several rounds of the game, was attentive to education provided, and had positive interactions with peers.      Sherrill Mck[place]ie, CRT

## 2022-11-09 NOTE — Plan of Care (Addendum)
I have personally reviewed and agree with the documentation and notes by Jasprit V. There are no additions, deletions or changes to be made. Sarah [name] was medication compliant. PRNs: Klonopin @ 1035 for 6/10 anxiety that was effective. Sarah [name] ate 90% of breakfast and 90% of lunch.    Sarah [name] is able to verbalize that they will remain safe at this time. Based on the Treatment guidelines and the professional opinion of the RN and treatment team, she may have her own clothes, standard linens and all silverware with meals.  Sarah [name] = Moderate    Sarah [name] is afebrile and asymptomatic for COVID-19 and is compliant with all necessary precautions.    Have you developed any new flu symptoms (cough, sore throat, runny nose, body aches) since your admissions that you did not have prior to admitting?  No    [facility]   Inpatient Shift Assessment      Problem: Risk for Suicidal Behavior  Description: Sarah [name] is at Risk for Suicidal Behaviors AEB thoughts of self harm, a history of bipolar disorder, and a current feelings of instability and a loss of control. CSSRS High.  Goal: Goal 1:STG/Objective  Description: Sarah [name] will verbalize at least 2 copings skills to use as alternatives for dealing with stress and emotional problems by 11/09/2022.    Intervention: Intervention C  Description: Unit staff will support Sarah [name] efforts to remain out of their room, to interact with other patients, or to attend activities.       Responsible staff: [name], RN  Note: Pt presented with depressed mood and congruent affect. Pt was present and engaged in the therapeutic milieu. Pt attended all unit programming. Pt ate 90% of her breakfast. Pt reported her anxiety was a 6 out of 10 and her depression was a 2 out of 10. Pt reported to staff she was having thoughts of self harm and asked them to take her pencil away. Pt denied HI/AVH. Pt will continued to be monitored per unit protocol.                Name:  Sarah [name]  MRN: [id number]  Admission date: 11/06/2022        Vitals:     Temp: 97.2 F (36.2 C)  Temp Source: Temporal  Heart Rate: 68  Resp: 16  BP: 134/57  BP Location: Left upper arm  BP Method: Automatic  Patient Position: Sitting  SpO2: 99 %  O2 Flow Rate (L/min): 99 L/min  Height: 5' 6 (167.6 cm)  Weight: (!) 301 lb 1.6 oz (136.6 kg)  Weight Source: Standing Scale  BMI (Calculated): 48.7      Pain/ Pain Reassessment:               Intake:            Output:            POCT Glucose:            Sarah Suicide Severity Rating Scale - Frequent Screener:                   Mental Status Exam:     Apparent Age: Appears Actual Age  Hygiene/Grooming: Well Groomed  General Attitude: Cooperative  Motor Activity: Freedom of movement  Eye Contact: Appropriate  Facial Expression: Anxious  Patient Behaviors: Anxious  Impulsivity: Normal  Speech Pattern: Within Defined Limits  Mood: Anxious  Affect: Responsive  Affect congruent with mood:  Yes  Content: Within Defined Limits  Delusions: Within Defined Limits  Perception: Appropriate  Hallucination: None  Thought content appropriate to situation: Yes  Danger to Others (WDL): Within Defined Limits  Thought process: Appropriate  Orientation [name]: Oriented X4           Edmonson Fall Risk     Age: Less than 50  Mental Status: Fully Alert/ Oriented at all times  Elimination: Independent with control of bowel/ bladder  Medication: Psychotropic medications ( including benzos and antidepressants)  Psych Diagnosis: Bipolar/ schizoafective disorder  Ambulation/ Balance: Independent/ Steady gait/ Immobile  Nutrition: No apparent abnormalities with appetite  Sleep Disturbance: Report of sleep disturbance by patient, family or staff  History of Falls: No history of falls  Secondary Diagnosis: No medical problems  Edmonson Fall Risk Score: 15             Patient Checks:     Interventions: Call bell within reach  Visual Checks: Standard Q15  Arm Bands On: ID, Allergies  Patient Checked  for Contraband: Belongings checked, Body checked, Clothing checked        Safety:     Depression rating: 2  Anxiety rating: 6  Self Injurious Thoughts: Denies  Self Injurious Behaviors: None observed        DASA     Irritablity: No  Verbal Threats: No  Impulsivity: No  Negative attitude: No  Unwillingness to follow direction: No  Sensitivity to perceived provocation: No  Easily angered when request denied: No  Total Score - DASA: 0      Withdrawl Symptoms:            Hygiene:            Nutrition Screen:              Jasprit Villagrana, LSW  11/09/2022  11:10 AM

## 2022-11-09 NOTE — Group Note (Signed)
Group Note    Patient Name: Sarah [name]    Date: 11/09/2022      Time: 1615     Group Type: DBT- Mindfulness    Group Name: How Skill    Group Objective: Reviewed what mindfulness is and the goals of mindfulness. Reviewed  what mindfulness skills (awareness) and introduced how mindfulness skills (acceptance). Taught the three How skills; non-judgmentally, one-mindfully, and effectively. Provided ideas for practicing non-judgmentalness, one-mindfulness, and effectiveness.      Attendance: Attended    Interactions: Interacted appropriately    Mood/Affect: Appropriate    Patient Participation: Patient attended group and completed worksheet and shared with peers. Patient engaged in conversation about group topics.    Haylin Camilli, MHS

## 2022-11-09 NOTE — Group Note (Signed)
Group Note    Patient Name: Sarah [name]    Date: 11/09/2022      Time: 0900     Group Type: Process    Group Name: Community    Group Objective: Patients identify what goal they would like to achieve for the day and talk about how they are feeling.     Attendance: Attended    Interactions: Interacted appropriately    Mood/Affect: Appropriate    Patient Participation: Pt attended and engaged in group. Pt completed community sheet.    Dillan Candela, LSW

## 2022-11-09 NOTE — Group Note (Signed)
Group Note    Patient Name: Sarah [name]    Date: 11/09/2022      Time: 1930     Group Type: Process Group    Group Name: Wrap Up Group    Group Objective:  To process the days events and allow patients to share.     Attendance: Attended    Interactions: Interacted appropriately    Mood/Affect: Appropriate    Patient Participation: Erandy Philbert attended group and interacted appropriately. Tehilla Cheatwood rated anxiety 7/10 and depression 0/10 (10 being high). Javae Stenzel denies SI, HI, and AVH at this time. She can come to staff if feeling unsafe.    Raheel Kunkle, MHS

## 2022-11-10 MED ORDER — OXcarbazepine (TRILEPTAL) tablet 600 mg
300 | Freq: Two times a day (BID) | ORAL | Status: AC
Start: 2022-11-10 — End: 2022-11-13
  Administered 2022-11-11 – 2022-11-13 (×6): 600 mg via ORAL

## 2022-11-10 MED FILL — CHOLECALCIFEROL (VITAMIN D3) 25 MCG (1,000 UNIT) TABLET: 1000 1000 units | ORAL | Qty: 2

## 2022-11-10 MED FILL — PROPRANOLOL 10 MG TABLET: 10 10 MG | ORAL | Qty: 1

## 2022-11-10 MED FILL — TRAZODONE 50 MG TABLET: 50 50 MG | ORAL | Qty: 1

## 2022-11-10 MED FILL — HYDROCHLOROTHIAZIDE 25 MG TABLET: 25 25 MG | ORAL | Qty: 1

## 2022-11-10 MED FILL — OXCARBAZEPINE 300 MG TABLET: 300 300 MG | ORAL | Qty: 1

## 2022-11-10 MED FILL — LATUDA 20 MG TABLET: 20 20 mg | ORAL | Qty: 1

## 2022-11-10 MED FILL — BUSPIRONE 10 MG TABLET: 10 10 MG | ORAL | Qty: 1

## 2022-11-10 MED FILL — FLUOXETINE 20 MG CAPSULE: 20 20 MG | ORAL | Qty: 1

## 2022-11-10 NOTE — Group Note (Signed)
Group Note    Patient Name: Sarah [name]    Date: 11/10/2022     Time: 1930     Group Type: Process    Group Name: Wrap-Up     Group Objective: To process the patient's day/evening, allow patient's to voice any questions/concerns, and reflect upon emotions to evaluate the progression. Patients are encouraged to reflect on their day and share positive experiences.       Attendance: Did not attend    Interactions: Did not interact    Mood/Affect: Unable to assess    Patient Participation: Pt was encouraged to attend group but declined due to having a visitor. Group materials made available.      Albesa Seen, MHS

## 2022-11-10 NOTE — Group Note (Signed)
Group Note    Patient Name: Sarah [name]    Date: 11/10/2022      Time: 1615     Group Type: DBT    Group Name: Mindfulness-Loving Kindness    Group Objective: Educated patients about the importance of increasing positive thoughts to cultivate compassion and loving feelings for themselves and others to act as an antidote to negativity. Taught patients loving kindness is a skill of reciting a set of warm wishes, such as May I be healthy or May I be safe to reduce ill will, hate, and anger towards themselves and others. Engaged patients in positive affirmation activity. Gave patients the opportunity to individually practice loving kindness by doing a guided meditation.     Attendance: Attended    Interactions: Interacted appropriately    Mood/Affect: Appropriate    Patient Participation: Pt was active and engaged in group. Pt participated in group discussion and shared when appropriate.       Albesa Seen, MHS

## 2022-11-10 NOTE — Plan of Care (Signed)
Problem: Risk for Suicidal Behavior  Description: Shaniece Drill is at Risk for Suicidal Behaviors AEB thoughts of self harm, a history of bipolar disorder, and a current feelings of instability and a loss of control. CSSRS High.  Goal: Goal 1:STG/Objective  Description: Arminda Thornes will verbalize at least 2 copings skills to use as alternatives for dealing with stress and emotional problems by 11/09/2022.    Outcome: Progressing    Saily reported her anxiety at 2/10 and depression 0/10 this shift. She denied having any SI, HI, or AVH. She reported feeling aggravated today because of being forced to take my meds. She refused to take her morning medications this morning until her provider was able to convince her. She took them at lunch time for this nurse. She appears well-groomed with an anxious affect. She was observed interacting with other and attending 3 groups. She had a visit with her mom that appeared to go well.    Rates anxiety 2/10 and depression 0/10.  Denies SI and [place] Suicide Severity Rating = Low Risk.  Verbalizes for safe behavior and to talk with staff if feels unsafe.   Denies HI  Denies visual/auditory hallucinations.    Medication compliant. Buspar held by provider.  PRN medications given this shift: None  Ate 100% breakfast and 100% lunch  Attended 3 groups this shift.    Pt denied developing any new flu symptoms (cough, sore throat, runny nose, body aches) since admission that they did not have prior to admitting.    [facility]   Inpatient Shift Assessment    Name: Crysal Toalson  MRN: [id number]  Admission date: 11/06/2022        Vitals:     Temp: 97 F (36.1 C)  Temp Source: Temporal  Heart Rate: 77  Resp: 18  BP: 134/82  BP Location: Left upper arm  BP Method: Automatic  Patient Position: Sitting  SpO2: 97 %  O2 Flow Rate (L/min): 99 L/min  Height: 5' 6 (167.6 cm)  Weight: (!) 301 lb 1.6 oz (136.6 kg)  Weight Source: Standing Scale  BMI (Calculated): 48.7      Pain/ Pain  Reassessment:     Pain Score: 0 - No Pain         Intake:            Output:            POCT Glucose:      NA      [place] Suicide Severity Rating Scale - Frequent Screener:     Have you actually had any thoughts of killing yourself since the last time you were asked?: No  Have you done anything, started to do anything, or prepared to do anything to end your life?: No    Assigned Risk: Low Risk  Low Risk Interventions: 15 minute checks at irregular intervals;Restrict patient to the unit;Family/Significant other engagement;Encourage to attend groups to learn coping skills, stress management, symptome management;Encourage attending DBT/CBT groups;Pharmacological Treatment;Montior visitors to deter patient from obtaining harmful objects;Reassess risk daily, upon status change and discharge        Mental Status Exam:     Apparent Age: Appears Actual Age  Hygiene/Grooming: Well Groomed  General Attitude: Cooperative;Uninterested  Motor Activity: Restlessness  Eye Contact: Appropriate  Facial Expression: Anxious  Patient Behaviors: Anxious  Impulsivity: Normal  Speech Pattern: Within Defined Limits  Mood: Anxious  Affect: Responsive  Affect congruent with mood: Yes  Content: Within Defined Limits  Delusions: Within  Defined Limits  Perception: Appropriate  Hallucination: None  Thought content appropriate to situation: Yes  Danger to Others (WDL): Within Defined Limits  Thought process: Appropriate  Orientation Level: Oriented X4  Insight: Impaired  Judgement: Impaired  Appetite Change: Normal for patient  Do you have any sleep concerns?: Denies problem        Edmonson Fall Risk     Age: Less than 50  Mental Status: Fully Alert/ Oriented at all times  Elimination: Independent with control of bowel/ bladder  Medication: Psychotropic medications ( including benzos and antidepressants)  Psych Diagnosis: Bipolar/ schizoafective disorder  Ambulation/ Balance: Independent/ Steady gait/ Immobile  Nutrition: No apparent  abnormalities with appetite  Sleep Disturbance: No sleep disturbance  History of Falls: No history of falls  Secondary Diagnosis: No medical problems  Edmonson Fall Risk Score: 69             Patient Checks:     Interventions: Call bell within reach, Dayroom observation, ID band on  Visual Checks: Standard Q15  Arm Bands On: ID, Allergies  Patient Checked for Contraband: Belongings checked, Body checked, Clothing checked        Safety:     Depression rating: 0  Anxiety rating: 2  Self Injurious Thoughts: Denies  Self Injurious Behaviors: None observed  Thoughts of Harming Others: Denies  Current Thoughts of Aggression: No  Elopement risk?: No  Methods to Calm Down: Change of environment;Quiet time in room        DASA     Irritablity: No  Verbal Threats: No  Impulsivity: No  Negative attitude: No  Unwillingness to follow direction: No  Sensitivity to perceived provocation: No  Easily angered when request denied: No  Total Score - DASA: 0      Withdrawl Symptoms:      NA      Hygiene:     Level of Assistance: Independent      Nutrition Screen:     Feeding: Able to feed self  Diet Type: Regular  Appetite: Good        Kavin Leech, RN  11/10/2022  12:15 PM

## 2022-11-10 NOTE — Progress Notes (Signed)
[facility]  Inpatient Progress Note    Name: Sarah [name]  MRN: [id number]  Total Duration: 38 minutes  Psychotherapy Add-On Duration: 16 min spent discussing recent psychosocial stressors, normalizing some increase in symptoms in this setting, and discussing behavioral approaches to improve functioning in addition to medication.    Interval History:  The patient reported feeling very irritable today. She stated that she declined all of her medication earlier because obviously it is not working. We spent time reviewing her recent psychosocial stressors and how they can understandably cause fluctuation in her symptoms regardless of the effect of medication. She also voiced a difficulty with feeling that she had no control over things in her life. We discussed her medication regimen and the patient felt comfortable taking her medications and making some alterations.    Physical Review Of Systems:  A comprehensive review of systems was negative.    Psychiatric Review Of Systems:  Sleep: poor  Interest: decreased  Anhedonia: increased  Appetite Changes: decreased  Weight Changes: No change  Energy: decreased  Libido: decreased  Anxiety/Panic:  increased  Guilt: increased   Hopeless: increased  S.I.B.s/risky behavior:  increased    Objective:  Vital signs in last 24 hours:  Temp:  [97 F (36.1 C)] 97 F (36.1 C)  Heart Rate:  [77] 77  Resp:  [18] 18  BP: (134)/(82) 134/82    Labs:  Available labs reviewed  within normal limits    Scheduled Meds:   cholecalciferol (vitamin D3)  2,000 Units Oral Daily 0900    FLUoxetine  20 mg Oral Daily 0900    hydroCHLOROthiazide  25 mg Oral Daily 0900    lurasidone  60 mg Oral Daily 0900    metFORMIN  750 mg Oral BID    OXcarbazepine  600 mg Oral BID    propranoloL  10 mg Oral BID     PRN Meds:.clonazePAM, ibuprofen, OLANZapine **AND** sterile water, olanzapine zydis, polyethylene glycol, traZODone    Mental Status Exam:  General   Development: Normal  Grooming/ Hygiene:  Unkempt  Demeanor: Polite, Cooperative  Eye Contact: Appropriate  Speech  Rate: Normal  Volume: Normal  Articulation: Normal  Quality: Normal  Motor  Atrophy: None  Abnormal movements: None  Station: Normal  Gait: Normal  Mood/ Affect  Mood: Anxious  Range: Normal  Reactivity: Blunted  Appropriateness: Appropriate to mood and/ or situation  Thought   Content: Normal  Process: Normal  Associations: Normal  Physical and Psychological Reality Test: Normal  Cognitive  Level of Alertness: Normal  Orientation: Oriented to all spheres  Short term memory: Intact  As evidenced by: Recall of medication taken this morning  Long term memory: Intact  As evidenced by: other (verifiable treatment history)  Attention/ Concentration/ Focus: Intact  Language: Intact  Intellect: Average  As evidenced by: Education, Occupational Functioning  Fund of knowledge: Intact  Safety  Harm to self: Yes - endorses ideation  Risk of Harm to self: Moderate  Harm to others: No  Risk of Harm to others: Low  Insight/ Judgement  Insight: Full  Judgment: Intact      Diagnosis: Bipolar Disorder, type I  PTSD    Assessment and Plan: 37 yo F w/ a history of bipolar disorder, PTSD, GAD, obesity, hypertension, PCOS, chronic lower extremity lymphedema, asthma, anxiety who presents with depression, anxiety and recurrent thoughts of self-harm.     Bipolar I disorder  PTSD    -Discontinue Buspar  -Continue Prozac 20mg   PO daily  -Continue Latuda 60mg  PO with dinner  -Increase Trileptal to 600mg  PO BID  -Continue Propranolol 10mg  PO BID    RN to administer medications, monitor compliance , side-effects and progress.  Medical Hospitalist to assess medical needs.  Social worker to assess needs and coordinate outpatient follow-up.  Dietitian to assess needs.  MHS to encourage participation in milieu activities    Disposition: discharge per patient safety, clinical stability and symptom improvement; adequate follow-up care in place.   Risks, benefits, side effects,  and alternatives to the above plan were discussed with patient. The patient voiced understanding and agreement with the treatment plan.      [facility] Organ, MD  11/10/2022

## 2022-11-10 NOTE — Group Note (Signed)
Name: Ryatt Style    Date: 11/10/2022    Time: 12:53 PM    Group Type: Recreation Therapy    Group Name: Kaleidocycle       Group Objective: Purpose of this group was to allow for creative expression and to work on following simple verbal directions to create an image out of paper.  Participants colored their own kaleidocycle with the supplies provided. Spoke of using it as fidget/coping skill.     Attendance: Did not attend    Interaction: Did not interact    Mood/Affect: Unable to assess    Participation: Pt was observed with a visitor, and declined to attend group when prompted by staff.  A handout on the benefits of engaging in healthy leisure activities was made available to Pt. Pt was made aware of an opportunity to ask questions related to group material.       Penni Penado Mayford Knife, CTRS

## 2022-11-10 NOTE — Plan of Care (Signed)
Problem: Ineffective Coping Skills  Description: Patient needs coping skills for symptoms of bipolar to get stable.   Goal: Goal  Description: Patient will identify 1-2 leisure skills to use as positive/healthy coping skills to help manage symptoms of bipolar.   Outcome: Not Progressing  Goal: Objective  Description: Patient will attend at least one RT group per day to identify and demonstrate leisure coping skills to address symptoms of bipolar.   Outcome: Progressing  Note: POC reviewed. RT assessment completed. Attending some RT groups and working toward her goal.

## 2022-11-10 NOTE — Group Note (Signed)
Name: Sarah [name]    Date: 11/10/2022    Time: 1400    Group Type: Social Work     Group Name: Mind Matters     Group Objective: Group members engaged in discussions about recognizing and understanding the relationship between their thoughts, behaviors, feelings, and somatic responses with their mental illness.  Group work involved identifying and dissecting intrusive thoughts and the emotions and physical responses they elicit. Participants developed strategies for managing negative patterns, incorporating mindfulness techniques and personalized affirmations into their daily routines.     Attendance: Attended    Interaction: Interacted appropriately     Mood/Affect: Appropriate    Participation: Patient attended group and completed worksheet and shared with peers. Patient engaged in conversation about group topics.    [name], MSW, LSW

## 2022-11-10 NOTE — Group Note (Signed)
Group Note    Patient Name: Sarah [name]    Date: 11/07/22      Time: 1500     Group Type: Enrichment    Group Name: Creating Me    Group Objective: Participants will use images to describe a piece of themselves, their lives or their dreams for discovery/exploration. Conversation includes the idea that we creat our own lives.     Attendance: Attended    Interactions: Interacted appropriately    Mood/Affect: Appropriate    Patient Participation: Patient created a collage representing a motto to live by  and shared the meaning behind the creations.  Listened to discussion about how they are the creators of their lives.  It was a work of Psychologist, educational.      Gena Fray, MDIV

## 2022-11-10 NOTE — Group Note (Signed)
Group Note    Patient Name: Sarah [name]    Date: 11/10/2022      Time: 0930     Group Type: CBT    Group Name: Wheel of Balance    Group Objective: The exercise measures your client's level of satisfaction in these areas on the day he or she works through this exercise. It is not a picture of how it has been in the past or what the client wants it to be in the future. It is a snapshot taken in the moment. It is not a report card on how well the client has performed or what he or she has achieved. The emphasis is on the client's level of satisfaction in each area. The Wheel of Life provides a unique model for clients. The wheel shows clients what balance in their life looks like.     Attendance: Attended    Interactions: Interacted appropriately    Mood/Affect: Appropriate    Patient Participation: Sarah [name] actively attended and participated in group. Patient engaged in discussion about group topics. Patient shared their examples with the group.       Sarah [name], MHS

## 2022-11-10 NOTE — Group Note (Signed)
Group Note    Patient Name: Clarice Scherff    Date: 11/10/2022      Time: 0900     Group Type: Goal Setting    Group Name: Community    Group Objective: This clinician assessed patients' physical and mental wellness via group discussion and review of daily check in document. Clinician also assessed patients' for presence of suicidal and/or homicidal ideation, plans or intentions and addressed patient concerns. Unit schedule, rules and regulation were reviewed. In addition, group discussed the importance of hygiene as needed.      Attendance: Attended    Interactions: Interacted appropriately    Mood/Affect: Appropriate    Patient Participation: Pt was active and present in community goals group. They engaged in self-reflection and completed check-in worksheet. Pt shared amongst group members and/or listened to others. Pt created a SMART goal during group session. Did not state a goal for the day. Pt rates anxiety 2/10 and depression 0/10, with 10 being the highest. Pt denies SI/HI/AVH and states that they can contract for safety.       Daanish Copes MHS

## 2022-11-10 NOTE — Plan of Care (Signed)
Problem: Risk for Suicidal Behavior  Description: Sarah Sarah [name] is at Risk for Suicidal Behaviors AEB thoughts of Sarah [name] harm, a history of bipolar disorder, and a current feelings of instability and a loss of control. CSSRS High.  Goal: Goal 1:STG/Objective  Description: Sarah Sarah [name].    Intervention: Intervention B  Description: Unit staff will provide a safe environment for Sarah Sarah [name].       Responsible staff: [name], RN  Note: Pt was visible most of this shift in the milieu and ate 90% of dinner. Pt had a visit this shift with her sister; the visit seemed pleasant and uneventful. Pts mood was anxious and affect was responsive. Pt interacted pleasantly with staff and interacted respectfully with other pts. Pt attended 2 groups this shift and denies SI, HI,and AVH. Pt rated their anxiety a 5/10 and their depression a 0/10 (10 being high). Pt states that if they are struggling, they are able to come to staff for assistance.   [facility]   Inpatient Shift Assessment    Name: Sarah Sarah [name]  MRN: [id number]  Admission date: 11/06/2022        Vitals:     Temp: 97 F (36.1 C)  Temp Source: Temporal  Heart Rate: 72  Resp: 18  BP: 144/78  BP Location: Left upper arm  BP Method: Automatic  Patient Position: Sitting  SpO2: 97 %  O2 Flow Rate (L/min): 99 L/min  Height: 5' 6 (167.6 cm)  Weight: (!) 301 lb 1.6 oz (136.6 kg)  Weight Source: Standing Scale  BMI (Calculated): 48.7      Pain/ Pain Reassessment:               Intake:            Output:            POCT Glucose:            [place] Suicide Severity Rating Scale - Frequent Screener:                   Mental Status Exam:     Apparent Age: Appears Actual Age  Hygiene/Grooming: Well Groomed  General Attitude:  Cooperative;Uninterested  Motor Activity: Restlessness  Eye Contact: Appropriate  Facial Expression: Anxious  Patient Behaviors: Anxious  Impulsivity: Normal  Speech Pattern: Within Defined Limits  Mood: Anxious  Affect: Responsive  Affect congruent with mood: Yes  Content: Within Defined Limits  Delusions: Within Defined Limits  Perception: Appropriate  Hallucination: None  Thought content appropriate to situation: Yes  Danger to Others (WDL): Within Defined Limits  Thought process: Appropriate  Memory Impairment: None  Cognition: Appropriate for developmental age  Orientation Level: Oriented X4  Intelligence: Average  Attention Span: Easily distracted  Insight: Impaired  Judgement: Impaired  Appetite Change: Normal for patient  Do you have any sleep concerns?: Denies problem  Libido: Normal        Ed[place] Fall Risk     Age: Less than 50  Mental Status: Fully Alert/ Oriented at all times  Elimination: Independent with control of bowel/ bladder  Medication: Psychotropic medications ( including benzos and antidepressants)  Psych Diagnosis: Bipolar/ schizoafective disorder  Ambulation/ Balance: Independent/ Steady gait/ Immobile  Nutrition: No apparent abnormalities with appetite  Sleep Disturbance: No sleep disturbance  History of Falls: No history of falls  Secondary Diagnosis: No medical problems  Ed[place] Fall Risk Score: 40             Patient Checks:     Interventions: Call bell within reach  Visual Checks: Standard Q15  Arm Bands On: ID, Allergies  Patient Checked for Contraband: Belongings checked        Safety:     Depression rating: 0  Anxiety rating: 5  Protective factors: Active and motivated in psych treatment  Sarah [name] Injurious Thoughts: Denies  Sarah [name] Injurious Behaviors: None observed  Thoughts of Harming Others: Denies  Current Thoughts of Aggression: No  Elopement risk?: No  Methods to Calm Down: Change of environment        DASA     Irritablity: No  Verbal Threats: No  Impulsivity: No  Negative  attitude: No  Unwillingness to follow direction: No  Sensitivity to perceived provocation: No  Easily angered when request denied: No  Total Score - DASA: 0      Withdrawl Symptoms:            Hygiene:     Level of Assistance: Independent      Nutrition Screen:     Feeding: Able to feed Sarah [name]  Diet Type: Regular  Appetite: Good        Sarah Sarah [name]  11/10/2022  9:21 PM

## 2022-11-10 NOTE — Group Note (Signed)
Name: Sarah [name]    Date: 11/10/2022    Time: 2:14 PM    Group Type: Recreation Therapy    Group Name: Leisure Education - Sequence    Group Objective: Purpose of this group was to learn a new leisure pursuit and increase socialization during a strategy game. Participants learned how to play sequence by matching playing cards to spaces on the board while attempting to make a sequence (5 chips in a row).      Attendance: Did not attend    Interaction: Did not interact    Mood/Affect: Unable to assess    Participation: Pt was unable to attend RT group as a result of meeting with a member of treatment team. A handout on the benefits of engaging in healthy leisure activities was made available to Pt. Pt was made aware of an opportunity to ask questions related to group material.       Miara Emminger Mayford Knife, CTRS

## 2022-11-11 MED FILL — HYDROCHLOROTHIAZIDE 25 MG TABLET: 25 25 MG | ORAL | Qty: 1

## 2022-11-11 MED FILL — CLONAZEPAM 0.5 MG TABLET: 0.5 0.5 MG | ORAL | Qty: 1

## 2022-11-11 MED FILL — IBUPROFEN 400 MG TABLET: 400 400 MG | ORAL | Qty: 1

## 2022-11-11 MED FILL — OXCARBAZEPINE 300 MG TABLET: 300 300 MG | ORAL | Qty: 2

## 2022-11-11 MED FILL — PROPRANOLOL 10 MG TABLET: 10 10 MG | ORAL | Qty: 1

## 2022-11-11 MED FILL — CHOLECALCIFEROL (VITAMIN D3) 25 MCG (1,000 UNIT) TABLET: 1000 1000 units | ORAL | Qty: 2

## 2022-11-11 MED FILL — FLUOXETINE 20 MG CAPSULE: 20 20 MG | ORAL | Qty: 1

## 2022-11-11 MED FILL — LATUDA 20 MG TABLET: 20 20 mg | ORAL | Qty: 1

## 2022-11-11 NOTE — Group Note (Signed)
Name: Sarah [name]    Date: 11/11/2022    Time: 12:55 PM    Group Type: Recreation Therapy    Group Name: Creative Expression - One Word    Group Objective: Purpose of this group was to allow for creative expression while painting with watercolors.  Participants were asked to choose a positive word and paint a picture that creatively correlates with the word. Discussions included different ways and the importance of creatively expressing themselves.    Attendance: Attended    Interaction: Interacted appropriately     Mood/Affect: Appropriate    Participation: Pt participated in RT group. Social and appropriate. Able to follow directions and painted her own picture. Chose the word breathe. Completed group.       Axelle Szwed [place], CTRS

## 2022-11-11 NOTE — Group Note (Signed)
Group Note    Patient Name: Sarah [name]    Date: 11/11/2022      Time: 1615     Group Type: DBT- Interpersonal Effectiveness     Group Name: Goals of Interpersonal Effectiveness    Group Objective: Taught patients the three main goals of interpersonal effectiveness: (1) be skillful in getting what you want from others, (2) build relationships and end destructive ones, and (3) walk the middle path. Discussed myths that get in the way of interpersonal effectiveness and clarified the truth in response to these myths.    Attendance: Did not attend    Interactions: Did not interact    Mood/Affect: Unable to assess    Patient Participation: Pt was encouraged to attend group but declined due to having a visitor at this time. Group materials made available and MHS able to review information with pt 1:1 if necessary.        Albesa Seen, MHS

## 2022-11-11 NOTE — Plan of Care (Addendum)
 Problem: Risk for Suicidal Behavior  Description: Sarah [name] is at Risk for Suicidal Behaviors AEB thoughts of self harm, a history of bipolar disorder, and a current feelings of instability and a loss of control. CSSRS High.  Goal: Goal 1:STG/Objective  Description: Sarah [name] will verbalize at least 2 copings skills to use as alternatives for dealing with stress and emotional problems by 11/09/2022.    Outcome: Progressing   Sarah [name] presents as anxious with a responsive affect.  She rates anxiety 2/10 and depression 2/10 early this morning. Sarah [name] denies SI, HI and AVH currently.  She was observed in the milieu interacting with other patients and staff. Sarah [name] got very anxious later this morning, and stated I don't feel safe right now, and I think I need a Klonopin. She rated her anxiety a 10/10 at that time.  Sarah [name] attended and engaged in 2 unit programs. She ate 90% of breakfast and 100% of lunch. She was medication compliant. PRNs: Klonopin for 10/10 anxiety and Ibuprofen @ 1059     Sarah [name] is able to verbalize that they will remain safe at this time.  Based on the Treatment guidelines and the professional opinion of the RN and treatment team, she may have her own clothes, standard linens and all silverware with meals.  [place] Risk Level = Low    Sarah [name] is afebrile and asymptomatic for COVID-19 and is compliant with all necessary precautions.    Have you developed any new flu symptoms (cough, sore throat, runny nose, body aches) since your admissions that you did not have prior to admitting?  No  [facility]   Inpatient Shift Assessment    Name: Sarah [name]  MRN: [id number]  Admission date: 11/06/2022        Vitals:     Temp: 97.2 F (36.2 C)  Temp Source: Oral  Heart Rate: 74  Resp: 16  BP: 120/81  BP Location: Left upper arm  BP Method: Automatic  Patient Position: Sitting  SpO2: 99 %  O2 Flow Rate (L/min): 99 L/min  Height: 5' 6 (167.6 cm)  Weight: (!) 301 lb 1.6 oz  (136.6 kg)  Weight Source: Standing Scale  BMI (Calculated): 48.7      Pain/ Pain Reassessment:     Patient's Stated Pain Goal: No pain  Pain Score: 7   Pain Location: Head         Intake:            Output:            POCT Glucose:            [place] Suicide Severity Rating Scale - Frequent Screener:     Have you actually had any thoughts of killing yourself since the last time you were asked?: No  Have you done anything, started to do anything, or prepared to do anything to end your life?: No    Assigned Risk: Low Risk  Low Risk Interventions: 15 minute checks at irregular intervals;Reassess risk daily, upon status change and discharge;Pharmacological Treatment;Family/Significant other engagement;Encourage attending DBT/CBT groups;Encourage to attend groups to learn coping skills, stress management, symptome management        Mental Status Exam:     Apparent Age: Appears Actual Age  Hygiene/Grooming: Neat;Clean  General Attitude: Cooperative;Pleasant;Uninterested  Motor Activity: Restlessness  Eye Contact: Appropriate  Facial Expression: Anxious  Patient Behaviors: Anxious  Impulsivity: Normal  Speech Pattern: Within Defined Limits  Mood: Anxious  Affect:  Responsive  Affect congruent with mood: Yes  Content: Within Defined Limits  Delusions: Within Defined Limits  Perception: Appropriate  Hallucination: None  Thought content appropriate to situation: Yes  Danger to Others (WDL): Within Defined Limits  Thought process: Appropriate  Orientation Level: Oriented X4  Insight: Impaired  Judgement: Impaired           Edmonson Fall Risk     Age: Less than 50  Mental Status: Fully Alert/ Oriented at all times  Elimination: Independent with control of bowel/ bladder  Medication: Psychotropic medications ( including benzos and antidepressants)  Psych Diagnosis: Bipolar/ schizoafective disorder  Ambulation/ Balance: Independent/ Steady gait/ Immobile  Nutrition: No apparent abnormalities with appetite  Sleep Disturbance: No  sleep disturbance  History of Falls: No history of falls  Secondary Diagnosis: No medical problems  Ed[place] Fall Risk Score: 35             Patient Checks:     Interventions: Call bell within reach, Dayroom observation, ID band on  Visual Checks: Standard Q15  Arm Bands On: ID, Allergies  Patient Checked for Contraband: Belongings checked        Safety:     Depression rating: 2  Anxiety rating: 2  Self Injurious Thoughts: Denies  Self Injurious Behaviors: None observed  Thoughts of Harming Others: Denies  Current Thoughts of Aggression: No  Elopement risk?: No  Methods to Calm Down: Change of environment;Talk with staff;PRN medications        DASA     Irritablity: No  Verbal Threats: No  Impulsivity: No  Negative attitude: No  Unwillingness to follow direction: No  Sensitivity to perceived provocation: No  Easily angered when request denied: No  Total Score - DASA: 0      Withdrawl Symptoms:            Hygiene:     Level of Assistance: Independent      Nutrition Screen:     Feeding: Able to feed self  Diet Type: Regular  Appetite: Coralie Carpen, RN  11/11/2022  11:21 AM

## 2022-11-11 NOTE — Group Note (Signed)
Name: Sarah [name]    Date: 11/11/2022    Time: 3:33 PM    Group Type: Recreation Therapy    Group Name: Dorisann Frames    Group Objective: Purpose of this group was to learn a new leisure pursuit while socializing. The game allowed participants to work cognition, concentration, hand eye coordination and speed of processing plays.  Discussion included the importance of leisure and benefits of playing games with others.     Attendance: Did not attend    Interaction: Did not interact    Mood/Affect: Unable to assess    Participation: Pt was observed with a visitor, and declined to attend group when prompted by staff.  A handout on the benefits of engaging in healthy leisure activities was made available to Pt. Pt was made aware of an opportunity to ask questions related to group material.       Nikaela Coyne Mayford Knife, CTRS

## 2022-11-11 NOTE — Plan of Care (Signed)
Problem: Risk of Suicidal Behavior  Intervention: Intervention  Description: SW will input outpatient appointments for patient in their AVS and review with them    Responsible staff: Kerry Dory, MSW, LSW/designee    Note: SW checked in with pt and husband.  Pt reports feeling close to her old self yesterday but struggled today with a new pt on the unit. SW went over coping skills to use to help pt feel safe in her environment. SW offered the comfort room should pt feel unsafe. SW answered questions related to d/c planning and aftercare to husband. SW provided contact info for husband to fwd short-term disability ppw for pt.     72 hr tx plan signed.

## 2022-11-11 NOTE — Group Note (Signed)
Patient Name: Sarah [name]     Group Type: Nutrition     Date: 11/11/2022     Group Time: 3:00     Group Name: Food Facts and Fallacies     Group Objective: Common food/nutrition myths were discussed. Various nutrition topics were covered including, but not limited to: BMI, Diets/Fad diets, Gluten, Lactose intolerance/dairy, and organic foods.       Attendance: Did not attend    Interactions: Did not interact    Mood/Affect: Unable to assess     Patient Participation: Patient was engaged in another activity on the unit during group. Group materials made available to patient.  RD available to answer any questions regarding topic covered in group.      Jonelle Sidle, RD, LD

## 2022-11-11 NOTE — Group Note (Signed)
Name: Berry Venable    Date: 11/11/2022    Time: 2:00 pm     Group Type: Social Work    Group Name: Habits    Group Objective: A habit plan involves connecting a new habit to an existing one, then rewarding the successful completion of the task. The Habit Plan worksheet provides instructions, examples, and a template for clients to create their own plan.    After clients fill out their plan, encourage them to keep it somewhere visible so they are reminded to work on the habit. Check in on their progress during each session, and make adjustments as necessary.    The goal of this handout is to teach the formula behind a habit plan. Once the concept is well understood, habit plans can be recorded in a journal, phone, or anywhere else convenient.    Attendance: Did not attend    Interaction: Did not interact    Mood/Affect: Unable to assess    Participation: Pt was not present for group due to meeting with another Child psychotherapist, Child psychotherapist provided handout to review.      [place], LISW

## 2022-11-11 NOTE — Group Note (Signed)
Group Note    Patient Name: Sarah [name]    Date: 11/11/2022      Time: 0930     Group Type: CBT    Group Name: Coping Cards    Group Objective: The Cope Ahead skill is intended to have patients consider how they might be prepared in some way to help them reduce stress ahead of the time. Patients will create flash cards with stress inducing situations and then write coping skills that they find helpful to help them prepare for future distressing situations.      Attendance: Attended    Interactions: Interacted appropriately    Mood/Affect: Appropriate    Patient Participation: Marcille actively attended and participated in group. Patient engaged in discussion about group topics. Patient shared their examples with the group.       Kassia Demarinis, MHS

## 2022-11-11 NOTE — Nursing Note (Signed)
Sarah [name] appeared to sleep 6.75 hours this night shift.  Q15 minute safety checks maintained throughout shift per unit protocol.

## 2022-11-11 NOTE — Group Note (Signed)
Group Note    Patient Name: Sarah [name]    Date: 11/11/2022     Time: 1930     Group Type: Process    Group Name: Wrap-Up     Group Objective: To process the patient's day/evening, allow patient's to voice any questions/concerns, and reflect upon emotions to evaluate the progression. Patients are encouraged to reflect on their day and share positive experiences.       Attendance: Attended    Interactions: Interacted appropriately    Mood/Affect: Appropriate    Patient Participation:  Pt was active and engaged in group. Pt participated in group discussion and shared when appropriate.       Albesa Seen, MHS

## 2022-11-11 NOTE — Group Note (Signed)
Group Note    Patient Name: Sarah [name]    Date: 11/11/2022      Time: 0900     Group Type: Goal Setting    Group Name: Community    Group Objective: This clinician assessed patients' physical and mental wellness via group discussion and review of daily check in document. Clinician also assessed patients' for presence of suicidal and/or homicidal ideation, plans or intentions and addressed patient concerns. Unit schedule, rules and regulation were reviewed. In addition, group discussed the importance of hygiene as needed.      Attendance: Attended    Interactions: Interacted appropriately    Mood/Affect: Appropriate    Patient Participation: Pt was active and present in community goals group. They engaged in self-reflection and completed check-in worksheet. Pt shared amongst group members and/or listened to others. Pt created a SMART goal during group session. Pt rates anxiety 2/10 and depression 2/10, with 10 being the highest. Pt denies SI/HI/AVH and states that they can contract for safety.       Anihya Tuma MHS

## 2022-11-11 NOTE — Progress Notes (Signed)
[facility]  Inpatient Progress Note    Name: Sarah [name]  MRN: [id number]  Total Duration: 36 minutes  Psychotherapy Add-On Duration: 17 min spent discussing recent psychosocial stressors, normalizing some increase in symptoms in this setting, and discussing behavioral approaches to improve functioning in addition to medication.    Interval History:  The patient reports feeling like her normal self later in the day yesterday after taking her medications with the increased dosage. However, she reports feeling intimidated and triggered by a patient that just arrived and this has made her feel much more uncomfortable. Support and validation was offered and we spent some time discussing management techniques and she was encouraged to talk with staff about any discomfort. She does not feel safe to go home at this time, but is hopeful about later this week.      Physical Review Of Systems:  A comprehensive review of systems was negative.    Psychiatric Review Of Systems:  Sleep: poor  Interest: decreased  Anhedonia: increased  Appetite Changes: decreased  Weight Changes: No change  Energy: decreased  Libido: decreased  Anxiety/Panic:  increased  Guilt: increased   Hopeless: increased  S.I.B.s/risky behavior:  increased    Objective:  Vital signs in last 24 hours:  Temp:  [97.2 F (36.2 C)] 97.2 F (36.2 C)  Heart Rate:  [72-74] 74  Resp:  [16] 16  BP: (120-144)/(78-81) 120/81    Labs:  Available labs reviewed  within normal limits    Scheduled Meds:   cholecalciferol (vitamin D3)  2,000 Units Oral Daily 0900    FLUoxetine  20 mg Oral Daily 0900    hydroCHLOROthiazide  25 mg Oral Daily 0900    lurasidone  60 mg Oral Daily 0900    metFORMIN  750 mg Oral BID    OXcarbazepine  600 mg Oral BID    propranoloL  10 mg Oral BID     PRN Meds:.clonazePAM, ibuprofen, OLANZapine **AND** sterile water, olanzapine zydis, polyethylene glycol, traZODone    Mental Status Exam:  General   Development: Normal  Grooming/ Hygiene:  Unkempt  Demeanor: Polite, Cooperative  Eye Contact: Appropriate  Speech  Rate: Normal  Volume: Normal  Articulation: Normal  Quality: Normal  Motor  Atrophy: None  Abnormal movements: None  Station: Normal  Gait: Normal  Mood/ Affect  Mood: Anxious  Range: Normal  Reactivity: Blunted  Appropriateness: Appropriate to mood and/ or situation  Thought   Content: Normal  Process: Normal  Associations: Normal  Physical and Psychological Reality Test: Normal  Cognitive  Level of Alertness: Normal  Orientation: Oriented to all spheres  Short term memory: Intact  As evidenced by: Recall of medication taken this morning  Long term memory: Intact  As evidenced by: other (verifiable treatment history)  Attention/ Concentration/ Focus: Intact  Language: Intact  Intellect: Average  As evidenced by: Education, Occupational Functioning  Fund of knowledge: Intact  Safety  Harm to self: Yes - endorses ideation  Risk of Harm to self: Moderate  Harm to others: No  Risk of Harm to others: Low  Insight/ Judgement  Insight: Full  Judgment: Intact      Diagnosis: Bipolar Disorder, type I  PTSD    Assessment and Plan: 37 yo F w/ a history of bipolar disorder, PTSD, GAD, obesity, hypertension, PCOS, chronic lower extremity lymphedema, asthma, anxiety who presents with depression, anxiety and recurrent thoughts of self-harm.     Bipolar I disorder  PTSD    -  Continue Prozac 20mg  PO daily  -Continue Latuda 60mg  PO with dinner  -Continue Trileptal to 600mg  PO BID  -Continue Propranolol 10mg  PO BID    RN to administer medications, monitor compliance , side-effects and progress.  Medical Hospitalist to assess medical needs.  Social worker to assess needs and coordinate outpatient follow-up.  Dietitian to assess needs.  MHS to encourage participation in milieu activities    Disposition: discharge per patient safety, clinical stability and symptom improvement; adequate follow-up care in place.   Risks, benefits, side effects, and alternatives to  the above plan were discussed with patient. The patient voiced understanding and agreement with the treatment plan.      [facility] Organ, MD  11/11/2022

## 2022-11-11 NOTE — Plan of Care (Signed)
Problem: Risk for Suicidal Behavior  Description: Brecken Michalski is at Risk for Suicidal Behaviors AEB thoughts of self harm, a history of bipolar disorder, and a current feelings of instability and a loss of control. CSSRS High.  Goal: Goal 1:STG/Objective  Description: Norvelle Kulaga will verbalize at least 2 copings skills to use as alternatives for dealing with stress and emotional problems by 11/09/2022.    Outcome: Progressing    Amica presents as anxious, pleasant, and cooperative.  Pt rates anxiety 8/10 and depression 0/10. Denies SI, HI and AVH currently.  Visible and social with peers in the milieu.  Pt attended and engaged in wrap up group. Ate 100% of dinner.  Pt was medication compliant.  No PRNs given.    Pt is able to verbalize that they will remain safe at this time. Pt agrees to talk with staff if her thoughts change and if she feels that she can no longer remain safe.  [place] Risk Level = low. Pt remains on every 15 minute checks per protocol.     Daily Medication Education Questions:  Do you have questions about your medications? no  2.   Any questions about side effects and how to manage them? no  3.   Any questions on how you take your medications? No    [facility]   Inpatient Shift Assessment    Name: Junette Moody  MRN: [id number]  Admission date: 11/06/2022        Vitals:     Temp: 97.2 F (36.2 C)  Temp Source: Oral  Heart Rate: 74  Resp: 16  BP: 120/81  BP Location: Left upper arm  BP Method: Automatic  Patient Position: Sitting  SpO2: 99 %  O2 Flow Rate (L/min): 99 L/min  Height: 5' 6 (167.6 cm)  Weight: (!) 301 lb 1.6 oz (136.6 kg)  Weight Source: Standing Scale  BMI (Calculated): 48.7      Pain/ Pain Reassessment:     Pain Score: 0 - No Pain         Intake:            Output:            POCT Glucose:            [place] Suicide Severity Rating Scale - Frequent Screener:     Have you actually had any thoughts of killing yourself since the last time you were asked?: No  Have you done  anything, started to do anything, or prepared to do anything to end your life?: No    Assigned Risk: Low Risk  Low Risk Interventions: 15 minute checks at irregular intervals;Reassess risk daily, upon status change and discharge;Pharmacological Treatment;Encourage attending DBT/CBT groups;Encourage to attend groups to learn coping skills, stress management, symptome management        Mental Status Exam:     Apparent Age: Appears Actual Age  Hygiene/Grooming: Well Groomed  General Attitude: Cooperative;Pleasant  Motor Activity: Freedom of movement  Eye Contact: Appropriate  Facial Expression: Anxious  Patient Behaviors: Anxious  Impulsivity: Normal  Speech Pattern: Within Defined Limits  Mood: Anxious  Affect: Responsive  Affect congruent with mood: Yes  Content: Within Defined Limits  Delusions: Within Defined Limits  Perception: Appropriate  Hallucination: None  Thought content appropriate to situation: Yes  Danger to Others (WDL): Within Defined Limits  Thought process: Appropriate  Memory Impairment: None  Cognition: Ability to abstract  Orientation Level: Oriented X4  Insight: Average  Judgement: Average  Appetite Change: Normal for patient        [place] Fall Risk     Age: Less than 50  Mental Status: Fully Alert/ Oriented at all times  Elimination: Independent with control of bowel/ bladder  Medication: Psychotropic medications ( including benzos and antidepressants)  Psych Diagnosis: Bipolar/ schizoafective disorder  Ambulation/ Balance: Independent/ Steady gait/ Immobile  Nutrition: No apparent abnormalities with appetite  Sleep Disturbance: No sleep disturbance  History of Falls: No history of falls  Secondary Diagnosis: No medical problems  [place] Fall Risk Score: 86             Patient Checks:     Interventions: Call bell within reach  Visual Checks: Standard Q15  Arm Bands On: ID, Allergies  Patient Checked for Contraband: Belongings checked        Safety:     Depression rating: 0  Anxiety rating:  8  Self Injurious Thoughts: Denies  Self Injurious Behaviors: None observed  Thoughts of Harming Others: Denies  Current Thoughts of Aggression: No  Elopement risk?: No  Methods to Calm Down: Change of environment        DASA     Irritablity: No  Verbal Threats: No  Impulsivity: No  Negative attitude: No  Unwillingness to follow direction: No  Sensitivity to perceived provocation: No  Easily angered when request denied: No  Total Score - DASA: 0      Withdrawl Symptoms:            Hygiene:     Level of Assistance: Independent      Nutrition Screen:     Feeding: Able to feed self  Diet Type: Regular  Appetite: Lurene Shadow, RN  11/11/2022  8:49 PM

## 2022-11-12 MED FILL — OXCARBAZEPINE 300 MG TABLET: 300 300 MG | ORAL | Qty: 2

## 2022-11-12 MED FILL — HYDROCHLOROTHIAZIDE 25 MG TABLET: 25 25 MG | ORAL | Qty: 1

## 2022-11-12 MED FILL — FLUOXETINE 20 MG CAPSULE: 20 20 MG | ORAL | Qty: 1

## 2022-11-12 MED FILL — PROPRANOLOL 10 MG TABLET: 10 10 MG | ORAL | Qty: 1

## 2022-11-12 MED FILL — LATUDA 20 MG TABLET: 20 20 mg | ORAL | Qty: 1

## 2022-11-12 MED FILL — CHOLECALCIFEROL (VITAMIN D3) 25 MCG (1,000 UNIT) TABLET: 1000 1000 units | ORAL | Qty: 2

## 2022-11-12 NOTE — Plan of Care (Signed)
Problem: Ineffective Coping Skills  Description: Patient needs coping skills for symptoms of bipolar to get stable.   Goal: Goal  Description: Patient will identify 1-2 leisure skills to use as positive/healthy coping skills to help manage symptoms of bipolar.   Outcome: Progressing  Goal: Objective  Description: Patient will attend at least one RT group per day to identify and demonstrate leisure coping skills to address symptoms of bipolar.   Outcome: Progressing  Note: POC reviewed. Attending most RT groups and working toward her goal.

## 2022-11-12 NOTE — Progress Notes (Signed)
[facility]  Inpatient Progress Note    Name: Sarah [name]  MRN: [id number]  Total Duration: 20 minutes  Psychotherapy Add-On Duration: none    Interval History:  11/12/22: Patient was seen with her female friend. They were playing cards in her bed.  Patient reported that she feels better, with more stable mood, less anxious.  Patient was calmer, cooperative, and pleasantly interactive.     Yesterday, staff noted:  The patient reports feeling like her normal self later in the day yesterday after taking her medications with the increased dosage. However, she reports feeling intimidated and triggered by a patient that just arrived and this has made her feel much more uncomfortable. Support and validation was offered and we spent some time discussing management techniques and she was encouraged to talk with staff about any discomfort. She does not feel safe to go home at this time, but is hopeful about later this week.      Physical Review Of Systems:  A comprehensive review of systems was negative.    Psychiatric Review Of Systems:  Sleep: poor  Interest: decreased  Anhedonia: increased  Appetite Changes: decreased  Weight Changes: No change  Energy: decreased  Libido: decreased  Anxiety/Panic:  increased  Guilt: increased   Hopeless: increased  S.I.B.s/risky behavior:  increased    Objective:  Vital signs in last 24 hours:  Heart Rate:  [65-80] 65  Resp:  [17] 17  BP: (124-148)/(66-97) 148/97    Labs:  Available labs reviewed  within normal limits    Scheduled Meds:   cholecalciferol (vitamin D3)  2,000 Units Oral Daily 0900    FLUoxetine  20 mg Oral Daily 0900    hydroCHLOROthiazide  25 mg Oral Daily 0900    lurasidone  60 mg Oral Daily 0900    metFORMIN  750 mg Oral BID    OXcarbazepine  600 mg Oral BID    propranoloL  10 mg Oral BID     PRN Meds:.clonazePAM, ibuprofen, OLANZapine **AND** sterile water, olanzapine zydis, polyethylene glycol, traZODone    Mental Status Exam:  General   Development:  Normal  Grooming/ Hygiene: Unkempt  Demeanor: Polite, Cooperative  Eye Contact: Appropriate  Speech  Rate: Normal  Volume: Normal  Articulation: Normal  Quality: Normal  Motor  Atrophy: None  Abnormal movements: None  Station: Normal  Gait: Normal  Mood/ Affect  Mood: Anxious  Range: Normal  Reactivity: Blunted  Appropriateness: Appropriate to mood and/ or situation  Thought   Content: Normal  Process: Normal  Associations: Normal  Physical and Psychological Reality Test: Normal  Cognitive  Level of Alertness: Normal  Orientation: Oriented to all spheres  Short term memory: Intact  As evidenced by: Recall of medication taken this morning  Long term memory: Intact  As evidenced by: other (verifiable treatment history)  Attention/ Concentration/ Focus: Intact  Language: Intact  Intellect: Average  As evidenced by: Education, Occupational Functioning  Fund of knowledge: Intact  Safety  Harm to self: Yes - endorses ideation  Risk of Harm to self: Moderate  Harm to others: No  Risk of Harm to others: Low  Insight/ Judgement  Insight: Full  Judgment: Intact      Diagnosis: Bipolar Disorder, type I  PTSD    Assessment and Plan: 37 yo F w/ a history of bipolar disorder, PTSD, GAD, obesity, hypertension, PCOS, chronic lower extremity lymphedema, asthma, anxiety who presents with depression, anxiety and recurrent thoughts of self-harm.  Bipolar I disorder  PTSD    -Continue Prozac 20mg  PO daily  -Continue Latuda 60mg  PO with dinner  -Continue Trileptal to 600mg  PO BID  -Continue Propranolol 10mg  PO BID    RN to administer medications, monitor compliance , side-effects and progress.  Medical Hospitalist to assess medical needs.  Social worker to assess needs and coordinate outpatient follow-up.  Dietitian to assess needs.  MHS to encourage participation in milieu activities    Disposition: discharge per patient safety, clinical stability and symptom improvement; adequate follow-up care in place.   Risks, benefits, side  effects, and alternatives to the above plan were discussed with patient. The patient voiced understanding and agreement with the treatment plan.    This note was copied forward from the note written by Dr Patrick Jupiter on 11/11/22  I have reviewed and updated the history, physical exam, data, assessment and plan of the note so that it reflects the evaluation and management of the patient on 11/12/22.       Veneta Penton, MD  11/12/2022

## 2022-11-12 NOTE — Group Note (Signed)
Group Note    Patient Name: Sarah [name]    Date: 11/12/2022      Time: 1615     Group Type: DBT- Interpersonal Effectiveness    Group Name: DEAR MAN    Group Objective: Taught patients the effective guidelines for getting what you want or refusing a request. These guidelines are summarized by the acronym DEAR MAN: describe, express, assert, reinforce, (stay) mindful, appear confident, and negotiate.  Requesting a want or need and/or saying no can be a difficult task for many. It is important that we feel confident and competent when expressing our needs to others. DEAR MAN is an easy way to remember the important steps we need to take when making requests.     Attendance: Attended    Interactions: Interacted appropriately    Mood/Affect: Appropriate    Patient Participation: Pt was active and engaged in group. Pt participated in group discussion and shared when appropriate.       Albesa Seen, MHS

## 2022-11-12 NOTE — Plan of Care (Signed)
Problem: Risk for Suicidal Behavior  Description: Sarah [name] is at Risk for Suicidal Behaviors AEB thoughts of self harm, a history of bipolar disorder, and a current feelings of instability and a loss of control. CSSRS High.  Goal: Goal 1:Long Term Goal  Description: Sarah [name] will verbalize knowledge of self-destructive behavior(s), other psychiatric problems, and safe use of medication by time of discharge from [facility].    Outcome: Progressing  Goal: Goal 1:STG/Objective  Description: Sarah [name] will verbalize at least 2 copings skills to use as alternatives for dealing with stress and emotional problems by 11/19/2022.    Outcome: Progressing     Pt presents as anxious with a responsive affect.  Pt rates anxiety 6/10 and depression 0/10.  Pt denies SI, HI and AVH currently.  Pt was observed active and social in the milieu.  Pt attended and engaged in DBT and wrap up unit programs.  Pt ate 75% of dinner.  Pt was medication compliant.  PRNs:  None.          Pt is able to verbalize that they will remain safe at this time.  Based on the Treatment guidelines and the professional opinion of the RN and treatment team, they may have their own clothes, standard linens and all silverware with meals.  [place] Risk Level = Low.    Daily Medication Education Questions:  Do you have questions about your medications? No  2.   Any questions about side effects and how to manage them? No  3.   Any questions on how you take your medications? No    [facility]   Inpatient Shift Assessment    Name: Marquella Ramdeen  MRN: [id number]  Admission date: 11/06/2022        Vitals:     Temp: 97.2 F (36.2 C)  Temp Source: Oral  Heart Rate: 65  Resp: 17  BP: (!) 148/97  BP Location: Left upper arm  BP Method: Automatic  Patient Position: Sitting  SpO2: 97 %  O2 Flow Rate (L/min): 99 L/min  Height: 5' 6 (167.6 cm)  Weight: (!) 301 lb 1.6 oz (136.6 kg)  Weight Source: Standing Scale  BMI (Calculated): 48.7      Pain/ Pain Reassessment:                Intake:            Output:            POCT Glucose:            [place] Suicide Severity Rating Scale - Frequent Screener:     Have you actually had any thoughts of killing yourself since the last time you were asked?: No  Have you done anything, started to do anything, or prepared to do anything to end your life?: No    Assigned Risk: Low Risk  Low Risk Interventions: 15 minute checks at irregular intervals;Reassess risk daily, upon status change and discharge;Pharmacological Treatment;Encourage attending DBT/CBT groups;Encourage to attend groups to learn coping skills, stress management, symptome management        Mental Status Exam:     Apparent Age: Appears Actual Age  Hygiene/Grooming: Neat;Clean  General Attitude: Cooperative;Pleasant  Motor Activity: Freedom of movement  Eye Contact: Appropriate  Facial Expression: Anxious  Patient Behaviors: Anxious  Impulsivity: Normal  Speech Pattern: Within Defined Limits  Mood: Anxious  Affect: Responsive  Affect congruent with mood: Yes  Content: Within Defined Limits  Delusions: Within Defined Limits  Perception: Appropriate  Hallucination: None  Thought content appropriate to situation: Yes  Danger to Others (WDL): Within Defined Limits  Thought process: Appropriate  Orientation Level: Oriented X4  Insight: Average  Judgement: Average  Appetite Change: Normal for patient  Do you have any sleep concerns?: Denies problem        [place] Fall Risk     Age: Less than 50  Mental Status: Fully Alert/ Oriented at all times  Elimination: Independent with control of bowel/ bladder  Medication: Psychotropic medications ( including benzos and antidepressants)  Psych Diagnosis: Bipolar/ schizoafective disorder  Ambulation/ Balance: Independent/ Steady gait/ Immobile  Nutrition: No apparent abnormalities with appetite  Sleep Disturbance: No sleep disturbance  History of Falls: No history of falls  Secondary Diagnosis: No medical problems  [place] Fall Risk Score: 59              Patient Checks:     Interventions: Call bell within reach, Dayroom observation, ID band on  Visual Checks: Standard Q15  Arm Bands On: ID, Allergies  Patient Checked for Contraband: Belongings checked        Safety:     Depression rating: 0  Anxiety rating: 6  Self Injurious Thoughts: Denies  Self Injurious Behaviors: None observed  Thoughts of Harming Others: Denies  Current Thoughts of Aggression: No  Elopement risk?: No  Methods to Calm Down: Change of environment;Talk with staff;PRN medications        DASA     Irritablity: No  Verbal Threats: No  Impulsivity: No  Negative attitude: No  Unwillingness to follow direction: No  Sensitivity to perceived provocation: No  Easily angered when request denied: No  Total Score - DASA: 0      Withdrawl Symptoms:            Hygiene:     Level of Assistance: Independent      Nutrition Screen:     Feeding: Able to feed self  Diet Type: Regular  Appetite: Good        Vincenzo Stave, RN  11/12/2022  8:54 PM

## 2022-11-12 NOTE — Group Note (Signed)
Name: Naeomi Marmolejos    Date: 11/12/2022    Time: 2:55 PM    Group Type: Recreation Therapy    Group Name: Hues and Cues      Group Objective: Group members engaged in a game where all the answers are color related. Cues are given to participants and then would attempt to guess the correct hue on the board. The focus of the group was engaging in healthy leisure and a cognitive exercise. Discussion included the importance of leisure.     Attendance: Attended    Interaction: Interacted appropriately     Mood/Affect: Appropriate    Participation: Pt engaged in RT group. Social and appropriate. Followed directions and learned how to play a new game. Completed group without issue.       Rachit Grim [place], CTRS

## 2022-11-12 NOTE — Group Note (Signed)
Name: La Brozyna    Date: 11/12/2022    Time: 2:00 pm     Group Type: Social Work    Group Name: Gratitude    Group Objective: Positive psychology has long recognized the frequent practice of gratitude as a technique to help boost happiness and well-being. This gratitude worksheet is intended to kick off a quick and fun gratitude session using simple sentence completion that's appropriate for all ages. We suggest using this worksheet as a group icebreaker, or as a prompt for further discussion. Be sure to discuss the benefits of practicing daily gratitudes!    Attendance: Did not attend    Interaction: Did not interact    Mood/Affect: Unable to assess    Participation: Pt was not present for group due to disinterest in SW group, social worker provided handout to review.      Winford Hehn, LISW

## 2022-11-12 NOTE — Group Note (Signed)
Group Note    Patient Name: Sarah [name]    Date: 11/12/2022      Time: 0930      Group Type: CBT    Group Name: S.M.A.R.T. Goals    Group Objective: To learn how to use the S.M.A.R.T. goal acronym to make goals that are specific, measurable, achievable, relevant, and timely. The group engages in a discussion about how to make these goals, obstacles to achieving the goals, and tasks they can take to make progress on their goals.     Attendance: Attended    Interactions: Interacted appropriately    Mood/Affect: Appropriate    Patient Participation: [name] actively attended and participated in group. Patient engaged in discussion about group topics. Patient shared their examples with the group.       [name], MHS

## 2022-11-12 NOTE — Group Note (Signed)
Group Note    Patient Name: Sarah [name]    Date: 11/12/2022     Time: 1930     Group Type: Process    Group Name: Wrap-Up     Group Objective: To process the patient's day/evening, allow patient's to voice any questions/concerns, and reflect upon emotions to evaluate the progression. Patients are encouraged to reflect on their day and share positive experiences.       Attendance: Attended    Interactions: Interacted appropriately    Mood/Affect: Appropriate    Patient Participation:  Pt was active and engaged in group. Pt participated in group discussion and shared when appropriate. Pt rates anxiety 6/10 and depression 0/10. Pt denies SI/HI/AVH. Pt reports something she struggled with today was PTSD and anxiety.       Peggye Pitt, MHS

## 2022-11-12 NOTE — Group Note (Signed)
Group Note    Patient Name: Sarah [name]    Date: 11/12/2022      Time: 1500     Group Type: Enrichment    Group Name: Encouragement    Group Objective: Encouragement is a gift (to [place] and to others). This group is about discussing and exploring ways to encourage others and to focus on more positive ways of living.     Attendance: Did not attend    Interactions: Did not interact    Mood/Affect: Unable to assess    Patient Participation: Patient did not attend 1500 group.  Handouts were left on the unit. Spiritual Care available if patient would like to review or discuss materials.      Barton Fanny, Spiritual Care Assistant

## 2022-11-12 NOTE — Group Note (Signed)
Group Note    Patient Name: [name]    Date: 11/12/2022      Time: 0900     Group Type: Goal Setting    Group Name: Community    Group Objective: This clinician assessed patients' physical and mental wellness via group discussion and review of daily check in document. Clinician also assessed patients' for presence of suicidal and/or homicidal ideation, plans or intentions and addressed patient concerns. Unit schedule, rules and regulation were reviewed. In addition, group discussed the importance of hygiene as needed.      Attendance: Attended    Interactions: Interacted appropriately    Mood/Affect: Appropriate    Patient Participation: Pt was active and present in community goals group. They engaged in self-reflection and completed check-in worksheet. Pt shared amongst group members and/or listened to others. Pt created a SMART goal during group session. Pt rates anxiety 7/10 and depression 2/10, with 10 being the highest. Pt denies SI/HI/AVH and states that they can contract for safety.       [name] MHS

## 2022-11-12 NOTE — Nursing Note (Signed)
 Sarah [name] asleep at 2315. As of 0600, pt appeared to sleep 6.75 hours this shift. Q15 minute checks maintained throughout shift per unit protocol.    PRNs: none    Verlan Friends., RN / Designee

## 2022-11-12 NOTE — Plan of Care (Addendum)
Problem: Risk for Suicidal Behavior  Description: Sarah [name] is at Risk for Suicidal Behaviors AEB thoughts of self harm, a history of bipolar disorder, and a current feelings of instability and a loss of control. CSSRS High.  Goal: Goal 1:STG/Objective  Description: Sarah [name] will verbalize at least 2 copings skills to use as alternatives for dealing with stress and emotional problems by 11/19/2022.    Outcome: Progressing    Sarah [name] presents as anxious, cooperative and pleasant .She rates anxiety 6/10 and depression 1/10. Sarah [name] denies SI, HI and AVH currently. She was observed active in the open area most of this shift. She had a visit from her Mom that seemed to go well. Sarah [name] attended and engaged in 4 unit programs. She ate 100% of breakfast and 90% of lunch. She was medication compliant. PRNs: 0    Sarah [name] is able to verbalize that they will remain safe at this time.  Based on the Treatment guidelines and the professional opinion of the RN and treatment team, she may have her own clothes, standard linens and all silverware with meals. Sarah [name] Risk Level = Low    Sarah [name] is afebrile and asymptomatic for COVID-19 and is compliant with all necessary precautions.    Have you developed any new flu symptoms (cough, sore throat, runny nose, body aches) since your admissions that you did not have prior to admitting?  No       [facility]   Inpatient Shift Assessment    Name: Sarah [name]  MRN: [id number]  Admission date: 11/06/2022        Vitals:     Temp: 97.2 F (36.2 C)  Temp Source: Oral  Heart Rate: 65  Resp: 17  BP: (!) 148/97  BP Location: Left upper arm  BP Method: Automatic  Patient Position: Sitting  SpO2: 97 %  O2 Flow Rate (L/min): 99 L/min  Height: 5' 6 (167.6 cm)  Weight: (!) 301 lb 1.6 oz (136.6 kg)  Weight Source: Standing Scale  BMI (Calculated): 48.7      Pain/ Pain Reassessment:     Patient's Stated Pain Goal: No pain  Pain Score: 4   Pain Location: Head         Intake:             Output:            POCT Glucose:            Sarah [name] Suicide Severity Rating Scale - Frequent Screener:     Have you actually had any thoughts of killing yourself since the last time you were asked?: No  Have you done anything, started to do anything, or prepared to do anything to end your life?: No    Assigned Risk: Low Risk  Low Risk Interventions: 15 minute checks at irregular intervals;Reassess risk daily, upon status change and discharge;Pharmacological Treatment;Family/Significant other engagement;Encourage attending DBT/CBT groups;Encourage to attend groups to learn coping skills, stress management, symptome management        Mental Status Exam:     Hygiene/Grooming: Neat;Clean  General Attitude: Cooperative;Pleasant  Motor Activity: Freedom of movement  Eye Contact: Appropriate  Facial Expression: Anxious  Patient Behaviors: Anxious  Impulsivity: Normal  Speech Pattern: Within Defined Limits  Mood: Anxious  Affect: Responsive  Affect congruent with mood: Yes  Content: Within Defined Limits  Delusions: Within Defined Limits  Perception: Appropriate  Hallucination: None  Thought content appropriate to situation: Yes  Danger to  Others (WDL): Within Defined Limits  Thought process: Appropriate  Orientation Level: Oriented X4  Insight: Average  Judgement: Average           Edmonson Fall Risk     Age: Less than 50  Mental Status: Fully Alert/ Oriented at all times  Elimination: Independent with control of bowel/ bladder  Medication: Psychotropic medications ( including benzos and antidepressants)  Psych Diagnosis: Bipolar/ schizoafective disorder  Ambulation/ Balance: Independent/ Steady gait/ Immobile  Nutrition: No apparent abnormalities with appetite  Sleep Disturbance: No sleep disturbance  History of Falls: No history of falls  Secondary Diagnosis: No medical problems  Edmonson Fall Risk Score: 65             Patient Checks:     Interventions: Call bell within reach, Dayroom observation, ID band  on  Visual Checks: Standard Q15  Arm Bands On: ID, Allergies  Patient Checked for Contraband: Belongings checked        Safety:     Depression rating: 1  Anxiety rating: 6  Self Injurious Thoughts: Denies  Self Injurious Behaviors: None observed  Thoughts of Harming Others: Denies  Current Thoughts of Aggression: No  Elopement risk?: No  Methods to Calm Down: Change of environment;Talk with staff;PRN medications        DASA     Irritablity: No  Verbal Threats: No  Impulsivity: No  Negative attitude: No  Unwillingness to follow direction: No  Sensitivity to perceived provocation: No  Easily angered when request denied: No  Total Score - DASA: 0      Withdrawl Symptoms:            Hygiene:     Level of Assistance: Independent      Nutrition Screen:     Feeding: Able to feed self  Diet Type: Regular  Appetite: Coralie Carpen, RN  11/12/2022  9:03 AM

## 2022-11-12 NOTE — Group Note (Signed)
Name: [name]    Date: 11/12/2022    Time: 12:03 PM    Group Type: Recreation Therapy    Group Name: Leisure Marketing executive - Rummikub    Group Objective: Purpose of this group was to learn a new leisure pursuit while increasing socialization opportunities.  Participants learned to play a game similar to 500 [address]ummy using a slightly different strategy.  This therapy group focused on leisure skill development and cognitive thinking.     Attendance: Did not attend    Interaction: Did not interact    Mood/Affect: Unable to assess    Participation: Pt was observed with a visitor, and declined to attend group when prompted by staff.  A handout on the benefits of engaging in healthy leisure activities was made available to Pt. Pt was made aware of an opportunity to ask questions related to group material.       Tilley Faeth Mayford Knife, CTRS

## 2022-11-13 MED ORDER — OXcarbazepine (TRILEPTAL) 600 MG tablet
600 | ORAL_TABLET | Freq: Two times a day (BID) | ORAL | 0 refills | Status: AC
Start: 2022-11-13 — End: 2022-12-13

## 2022-11-13 MED ORDER — acetaminophen (TYLENOL) tablet 650 mg
325 | Freq: Once | ORAL | Status: AC
Start: 2022-11-13 — End: 2022-11-13
  Administered 2022-11-13: 21:00:00 650 mg via ORAL

## 2022-11-13 MED FILL — OXCARBAZEPINE 300 MG TABLET: 300 300 MG | ORAL | Qty: 2

## 2022-11-13 MED FILL — HYDROCHLOROTHIAZIDE 25 MG TABLET: 25 25 MG | ORAL | Qty: 1

## 2022-11-13 MED FILL — IBUPROFEN 400 MG TABLET: 400 400 MG | ORAL | Qty: 1

## 2022-11-13 MED FILL — TYLENOL 325 MG TABLET: 325 325 mg | ORAL | Qty: 2

## 2022-11-13 MED FILL — PROPRANOLOL 10 MG TABLET: 10 10 MG | ORAL | Qty: 1

## 2022-11-13 MED FILL — FLUOXETINE 20 MG CAPSULE: 20 20 MG | ORAL | Qty: 1

## 2022-11-13 MED FILL — LATUDA 20 MG TABLET: 20 20 mg | ORAL | Qty: 1

## 2022-11-13 MED FILL — CHOLECALCIFEROL (VITAMIN D3) 25 MCG (1,000 UNIT) TABLET: 1000 1000 units | ORAL | Qty: 2

## 2022-11-13 NOTE — Group Note (Signed)
Group Note    Patient Name: Sarah [name]    Date: 11/13/2022      Time: 0900     Group Type: Goal Setting    Group Name: Community    Group Objective: This clinician assessed patients' physical and mental wellness via group discussion and review of daily check in document. Clinician also assessed patients' for presence of suicidal and/or homicidal ideation, plans or intentions and addressed patient concerns. Unit schedule, rules and regulation were reviewed. In addition, group discussed the importance of hygiene as needed.      Attendance: Attended    Interactions: Interacted appropriately    Mood/Affect: Appropriate    Patient Participation: Pt was active and present in community goals group. They engaged in self-reflection and completed check-in worksheet. Pt shared amongst group members and/or listened to others. Pt created a SMART goal during group session. Pt rates anxiety 3/10 and depression 2/10, with 10 being the highest. Pt denies SI/HI/AVH and states that they can contract for safety.         Jakiyah Stepney MHS

## 2022-11-13 NOTE — Group Note (Incomplete)
Group Note    Patient Name: Sarah [name]    Date: 11/13/2022      Time: 1630     Group Type: DBT    Group Name: DBT- GIVE and FAST    Group Objective: Patients will learn an effective way to communicate and resolve conflicts in an assertive manner. Patients will be able to maintain and improve relationships while learning to ask what they need effectively, by providing others with a gentle approach, acting interested, validating others' feelings, and using an easy manner. Patients will also understand that effective communication requires being fair, not apologizing for what they need, sticking to their values, and staying truthful.      Attendance: {[facility] GROUP ATTENDANCE:21273}    Interactions: {[facility] GROUP INTERACTIONS:21274}    Mood/Affect: {[facility] GROUP AFFECT/MOOD:21275}    Patient Participation:     Sarah [name]

## 2022-11-13 NOTE — Group Note (Signed)
Name: Sarah [name]    Date: 11/13/2022    Time: 12:19 PM    Group Type: Recreation Therapy    Group Name: Mindfulness - Salt Art     Group Objective: Purpose of this group was mindfulness exercise and to discuss the idea of patience. Participants received watercolor paper to create their own background. After the background was painted, participants then crafted a design with glue and salt was poured over the glue. Participants were then to use their watercolor kit to dab the paint on the salt and watch the color disperse.     Attendance: Attended    Interaction: Interacted appropriately     Mood/Affect: Appropriate    Participation: Pt arrived late to RT group. Able to follow directions and created her own art. Completed group without issue.       Carli Lefevers [place], CTRS
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
## 2022-11-13 NOTE — Plan of Care (Signed)
Problem: Risk for Suicidal Behavior  Description: Sarah [name] is at Risk for Suicidal Behaviors AEB thoughts of self harm, a history of bipolar disorder, and a current feelings of instability and a loss of control. CSSRS High.  Goal: Goal 1:STG/Objective  Description: Sarah [name] will verbalize at least 2 copings skills to use as alternatives for dealing with stress and emotional problems by 11/19/2022.    Outcome: Progressing   Sarah [name] presents as anxious with a responsive affect . She rates anxiety 3/10 and depression 2/10. Sarah [name] denies SI, HI and AVH currently. She was observed in the open area talking with other peers.  Sarah [name] attended and engaged in 2 unit programs. She ate 0% of breakfast and 100% of lunch. She was medication compliant. PRNs: Ibuprofen @ 1022 for 6/10 headache.    Sarah [name] is able to verbalize that they will remain safe at this time. Based on the Treatment guidelines and the professional opinion of the RN and treatment team, she may have her own clothes, standard linens and all silverware with meals. Sarah [name] Risk Level = Low    Sarah [name] is afebrile and asymptomatic for COVID-19 and is compliant with all necessary precautions.    Have you developed any new flu symptoms (cough, sore throat, runny nose, body aches) since your admissions that you did not have prior to admitting?    No  [facility]   Inpatient Shift Assessment    Name: Sarah [name]  MRN: [id number]  Admission date: 11/06/2022        Vitals:     Temp: 97.2 F (36.2 C)  Temp Source: Oral  Heart Rate: 65  Resp: 18  BP: (!) 145/94  BP Location: Right upper arm  BP Method: Automatic  Patient Position: Sitting  SpO2: 97 %  O2 Flow Rate (L/min): 99 L/min  Height: 5' 6 (167.6 cm)  Weight: (!) 301 lb 1.6 oz (136.6 kg)  Weight Source: Standing Scale  BMI (Calculated): 48.7      Pain/ Pain Reassessment:     Patient's Stated Pain Goal: No pain  Pain Score: 6          Intake:            Output:            POCT Glucose:             Sarah [name] Suicide Severity Rating Scale - Frequent Screener:     Have you actually had any thoughts of killing yourself since the last time you were asked?: No  Have you done anything, started to do anything, or prepared to do anything to end your life?: No    Assigned Risk: Low Risk  Low Risk Interventions: 15 minute checks at irregular intervals;Reassess risk daily, upon status change and discharge;Pharmacological Treatment;Family/Significant other engagement;Encourage attending DBT/CBT groups;Encourage to attend groups to learn coping skills, stress management, symptome management        Mental Status Exam:     Apparent Age: Appears Actual Age  Hygiene/Grooming: Neat;Clean  General Attitude: Cooperative;Pleasant  Motor Activity: Freedom of movement  Eye Contact: Appropriate  Facial Expression: Anxious  Patient Behaviors: Anxious  Impulsivity: Normal  Speech Pattern: Within Defined Limits  Mood: Anxious  Affect: Responsive  Affect congruent with mood: Yes  Content: Within Defined Limits  Delusions: Within Defined Limits  Perception: Appropriate  Hallucination: None  Thought content appropriate to situation: Yes  Danger to Others (WDL): Within Defined Limits  Thought process:  Appropriate  Orientation Level: Oriented X4  Insight: Average  Judgement: Average  Appetite Change: Normal for patient  Do you have any sleep concerns?: Denies problem        Edmonson Fall Risk     Age: Less than 50  Mental Status: Fully Alert/ Oriented at all times  Elimination: Independent with control of bowel/ bladder  Medication: Psychotropic medications ( including benzos and antidepressants)  Psych Diagnosis: Bipolar/ schizoafective disorder  Ambulation/ Balance: Independent/ Steady gait/ Immobile  Nutrition: No apparent abnormalities with appetite  Sleep Disturbance: No sleep disturbance  History of Falls: No history of falls  Secondary Diagnosis: No medical problems  Edmonson Fall Risk Score: 58             Patient Checks:      Interventions: Call bell within reach, Dayroom observation, ID band on  Visual Checks: Standard Q15  Arm Bands On: ID, Allergies  Patient Checked for Contraband: Belongings checked        Safety:     Depression rating: 1  Anxiety rating: 3  Self Injurious Thoughts: Denies  Self Injurious Behaviors: None observed  Thoughts of Harming Others: Denies  Current Thoughts of Aggression: No  Elopement risk?: No  Methods to Calm Down: Change of environment;Talk with staff;PRN medications        DASA     Irritablity: No  Verbal Threats: No  Impulsivity: No  Negative attitude: No  Unwillingness to follow direction: No  Sensitivity to perceived provocation: No  Easily angered when request denied: No  Total Score - DASA: 0      Withdrawl Symptoms:            Hygiene:     Level of Assistance: Independent      Nutrition Screen:     Feeding: Able to feed self  Diet Type: Regular  Appetite: Sarah Carpen, RN  11/13/2022  10:36 AM

## 2022-11-13 NOTE — Group Note (Signed)
Name: Sarah [name]    Date: 11/13/2022    Time: 2:27 PM    Group Type: Recreation Therapy    Group Name: Code Names    Group Objective: Pts will learn and play the game Code Names to work on teamwork and communication.  Staff will provide education on the skills used while playing the game, and as challenges present within the game, prompt discussions on why, and how to problem solve. The goal of the group is for pts to think critically about problem solving while playing Code Names.     Attendance: Attended    Interaction: Interacted appropriately     Mood/Affect: Appropriate    Participation: Pt attended and participated in the group.  Pt learned to play the game and worked with teammates during the group.      Dorothie Wah, CRT

## 2022-11-13 NOTE — Nursing Note (Signed)
Reviewed all discharge instructions- including importance of medication management. Encouraged to keep medication list updated as medications are added, changed or deleted. Encouraged to keep list on person at all times in case of an emergency & to give a copy of the updated medication list to next care provider.  Information given regarding discharge meds and prescriptions were given as indicated.  Patient satisfaction survey left with patient to complete.  Sarah [name] verbalized understanding and was accompanied to front lobby by [facility] to be discharged in care of family at [facility]. [employer] met in lobby to retrieve all valuables.  Sarah [name] completed.     Sarah Gasparro, RN/designee

## 2022-11-13 NOTE — Progress Notes (Signed)
Pt appeared asleep at 2330. As of 0600, pt appeared to sleep 6.5 hours this shift. Q 15 minute checks maintained throughout shift per unit protocol.

## 2022-11-13 NOTE — Plan of Care (Signed)
Problem: Risk of Suicidal Behavior  Intervention: Intervention  Description: SW will input outpatient appointments for patient in their AVS and review with them    Responsible staff: Kerry Dory, MSW, LSW/designee    Note: Discharge Note:  Pt leaving today per Dr. Patrick Jupiter with husband to transport home. The patient satisfaction survey was given to the pt to complete. Pt signed ROIs for outpatient providers. Pt has 30 days scripts for medications. Pt and SW reviewed discharge safety plan as well as pt's follow up appointments, which are listed below. Pt's mood was anxious and affect was congruent, denied SI/HI, denied A/V hallucinations, appeared alert and oriented x4, appeared to have organized thoughts.    Discharge Information  Referred to: Outpatient  Smoking Cessation offered: N/A  DC Plan discussed w/ pt/pt representative?: Yes  Discharge Disposition: Own home  Transportation: Family  Financial Support: Employeed Full Time  Support Services: Family  Recreation/ Leisure Resources: Reading, Journaling, Movies/ TV, Art  Discharge Legal Status: N/A     Follow-up Information       Bebe Liter, PMHNP- BC. Go on 11/19/2022.    Why: Medication Management follow-up at 5:45pm  Contact information:  [facility]  [address]  [place], [place] [address]    [phone]  f. [phone]             Tamsen Snider, LICDC, MHF, MMP. Go on 11/19/2022.    Why: Therapy follow-up appointment at 5pm in person.  Contact information:  [address]  [place], [place] [address]    [phone]

## 2022-11-13 NOTE — Discharge Instructions (Signed)
Crisis Management Plan    Remove all firearms, weapons (of any kind) or any unneeded medicines that could be used.  Identify a support person/advocate and attend follow-up mental health appointments with this person.  Be direct and talk openly about suicidal thoughts.  Allow expression of feelings.  Block all inappropriate internet websites and social media.  Get help from agencies that specialize in crisis intervention.  Create a personalized safety plan.   The following list are suicide prevention resources available 24 hours a day.    [facility]: (513) 536-4673     [place]:    Crisis Hotline:     281-CARE (2273)     [facility]:  513-584-8577     Mobile Crisis Team:    513-584-5098    [place]:   [place] [facility] [phone]    [place]:    Emergency Crisis Hotline:   513-528-SAVE (7283)    [place]:   [place] Emergency Crisis Line:  859-331-3292    National:    National Hotline:    1-800-273-TALK(8255)     www.suicidepreventionlifeline.org    Local Crisis Hotline  877-695-6333 ([place])  Crisis Text Line 741741  National #    988  [facility] 800-273-8255, select 1  [facility] (LGBTQ teens) 866-488-7386

## 2022-11-13 NOTE — Group Note (Signed)
Group Note    Patient Name: Sarah [name]    Date:  11/13/2022      Time:  0930     Group Type:  CBT    Group Name:  Protective Factors    Group Objective:  To discuss the importance of protective factors in mental health. To allow clients to evaluate what protective factors they need to improve.     Attendance: Attended    Interactions: Interacted appropriately    Mood/Affect: Appropriate    Patient Participation: Sarah [name] actively attended and participated in group. Patient engaged in discussion about group topics. Patient shared their examples with the group.       Sarah [name], MHS

## 2022-11-13 NOTE — Group Note (Cosign Needed)
Medication Management Group    Patient Name: Sarah [name]    Date: 11/13/2022       Time Start:1500    Time End: 1545     Group Type: Med Ed    Group Name: Medication Management    Group Objective: To discuss ways to be successful in taking medication, how to identify and manage side effects, and why medication is important in managing mental illness.     Attendance: Did not attend    Interactions: Did not interact    Mood/Affect: Unable to assess    Patient Participation: Sarah [name] didn't attend the group session. Alerted that one-on-one group materials are available if desired.     Sarah [name]

## 2022-11-13 NOTE — Group Note (Signed)
Name: Sarah [name]    Date: 11/13/2022    Time: 1400    Group Type: Social Work     roup Name: Power of words     Group Objective: Group members reviewed how words can impact how one feels and thinks, entering a discussion on current thinking patterns and how to introduce more positive content into their lives to rewrite those patterns (music, tv, and the people they interact with). Group discussion included psychoeducation on the sequential brain functions related to emotions, mindfulness techniques for regulation, and creation of individual affirmations to help get out of negative thinking patterns.     Attendance: Attended    Interaction: Interacted appropriately     Mood/Affect: Appropriate    Participation: Patient attended group and completed worksheet and shared with peers. Patient engaged in conversation about group topics.    [name], MSW, LSW

## 2023-04-02 ENCOUNTER — Ambulatory Visit: Admit: 2023-04-02 | Discharge: 2017-07-20 | Payer: MEDICARE

## 2023-04-02 VITALS — BP 185/86 | HR 58 | Temp 97.20000°F | Resp 17 | Ht 67.0 in | Wt 177.9 lb

## 2023-04-02 DIAGNOSIS — G43809 Other migraine, not intractable, without status migrainosus: Secondary | ICD-10-CM

## 2023-08-07 MED ORDER — VERAPAMIL HCL SR (BID) 180 MG OR TBCR
180 | ORAL_TABLET | ORAL | 3 refills | Status: AC
Start: 2023-08-07 — End: ?

## 2023-08-07 MED ORDER — VERAPAMIL HCL ER (DAILY) 240 MG OR CP24
240 | ORAL_CAPSULE | Freq: Every day | ORAL | 3 refills | Status: AC
Start: 2023-08-07 — End: ?

## 2023-08-07 MED ORDER — RIZATRIPTAN BENZOATE 10 MG OR TABS
10 | ORAL_TABLET | Freq: Once | ORAL | 1 refills | Status: AC | PRN
Start: 2023-08-07 — End: ?
